# Patient Record
Sex: Female | Born: 1945 | Race: White | Hispanic: No | Smoking: Never smoker
Health system: Southern US, Community
[De-identification: ages and names within clinical notes are randomized; demographics above are authoritative.]

## PROBLEM LIST (undated history)

## (undated) DIAGNOSIS — R7303 Prediabetes: Secondary | ICD-10-CM

## (undated) DIAGNOSIS — M1990 Unspecified osteoarthritis, unspecified site: Secondary | ICD-10-CM

## (undated) DIAGNOSIS — F419 Anxiety disorder, unspecified: Secondary | ICD-10-CM

## (undated) DIAGNOSIS — Z9889 Other specified postprocedural states: Secondary | ICD-10-CM

## (undated) DIAGNOSIS — M797 Fibromyalgia: Secondary | ICD-10-CM

## (undated) DIAGNOSIS — R002 Palpitations: Secondary | ICD-10-CM

## (undated) DIAGNOSIS — J189 Pneumonia, unspecified organism: Secondary | ICD-10-CM

## (undated) DIAGNOSIS — E785 Hyperlipidemia, unspecified: Secondary | ICD-10-CM

## (undated) DIAGNOSIS — T8859XA Other complications of anesthesia, initial encounter: Secondary | ICD-10-CM

## (undated) DIAGNOSIS — S329XXA Fracture of unspecified parts of lumbosacral spine and pelvis, initial encounter for closed fracture: Secondary | ICD-10-CM

## (undated) DIAGNOSIS — K922 Gastrointestinal hemorrhage, unspecified: Secondary | ICD-10-CM

## (undated) DIAGNOSIS — K219 Gastro-esophageal reflux disease without esophagitis: Secondary | ICD-10-CM

## (undated) DIAGNOSIS — R112 Nausea with vomiting, unspecified: Secondary | ICD-10-CM

## (undated) DIAGNOSIS — K259 Gastric ulcer, unspecified as acute or chronic, without hemorrhage or perforation: Secondary | ICD-10-CM

## (undated) HISTORY — DX: Fibromyalgia: M79.7

## (undated) HISTORY — DX: Hyperlipidemia, unspecified: E78.5

## (undated) HISTORY — DX: Anxiety disorder, unspecified: F41.9

## (undated) HISTORY — DX: Pneumonia, unspecified organism: J18.9

## (undated) HISTORY — PX: ORIF HUMERUS FRACTURE: SHX2126

## (undated) HISTORY — PX: ABDOMINAL HYSTERECTOMY: SHX81

## (undated) HISTORY — DX: Unspecified osteoarthritis, unspecified site: M19.90

## (undated) HISTORY — DX: Gastric ulcer, unspecified as acute or chronic, without hemorrhage or perforation: K25.9

## (undated) HISTORY — PX: PYLOROPLASTY: SHX418

## (undated) HISTORY — PX: JOINT REPLACEMENT: SHX530

---
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
## 1988-11-07 DIAGNOSIS — K259 Gastric ulcer, unspecified as acute or chronic, without hemorrhage or perforation: Secondary | ICD-10-CM

## 1988-11-07 HISTORY — DX: Gastric ulcer, unspecified as acute or chronic, without hemorrhage or perforation: K25.9

## 1996-11-07 HISTORY — PX: ORIF TIBIA FRACTURE: SHX5416

## 1996-11-07 HISTORY — PX: FRACTURE SURGERY: SHX138

## 2003-11-08 DIAGNOSIS — J189 Pneumonia, unspecified organism: Secondary | ICD-10-CM

## 2003-11-08 HISTORY — DX: Pneumonia, unspecified organism: J18.9

## 2009-11-07 HISTORY — PX: TOTAL HIP ARTHROPLASTY: SHX124

## 2012-11-07 HISTORY — PX: TOTAL SHOULDER REPLACEMENT: SUR1217

## 2013-11-07 HISTORY — PX: LUMBAR FUSION: SHX111

## 2016-12-12 DIAGNOSIS — Z981 Arthrodesis status: Secondary | ICD-10-CM | POA: Insufficient documentation

## 2016-12-12 DIAGNOSIS — M0579 Rheumatoid arthritis with rheumatoid factor of multiple sites without organ or systems involvement: Secondary | ICD-10-CM | POA: Insufficient documentation

## 2016-12-12 DIAGNOSIS — M797 Fibromyalgia: Secondary | ICD-10-CM | POA: Insufficient documentation

## 2017-01-06 DIAGNOSIS — M1990 Unspecified osteoarthritis, unspecified site: Secondary | ICD-10-CM | POA: Insufficient documentation

## 2017-01-06 DIAGNOSIS — F411 Generalized anxiety disorder: Secondary | ICD-10-CM | POA: Insufficient documentation

## 2017-04-27 DIAGNOSIS — M545 Low back pain, unspecified: Secondary | ICD-10-CM | POA: Insufficient documentation

## 2017-04-27 DIAGNOSIS — G8929 Other chronic pain: Secondary | ICD-10-CM | POA: Insufficient documentation

## 2017-10-25 DIAGNOSIS — F409 Phobic anxiety disorder, unspecified: Secondary | ICD-10-CM | POA: Insufficient documentation

## 2017-10-25 DIAGNOSIS — Z79899 Other long term (current) drug therapy: Secondary | ICD-10-CM | POA: Insufficient documentation

## 2018-04-27 ENCOUNTER — Encounter: Payer: Self-pay | Admitting: Physician Assistant

## 2018-05-09 ENCOUNTER — Ambulatory Visit (INDEPENDENT_AMBULATORY_CARE_PROVIDER_SITE_OTHER): Payer: Medicare Other | Admitting: Physician Assistant

## 2018-05-09 ENCOUNTER — Encounter: Payer: Self-pay | Admitting: Physician Assistant

## 2018-05-09 VITALS — BP 116/72 | HR 84 | Ht 63.0 in | Wt 144.0 lb

## 2018-05-09 DIAGNOSIS — R197 Diarrhea, unspecified: Secondary | ICD-10-CM

## 2018-05-09 DIAGNOSIS — K2270 Barrett's esophagus without dysplasia: Secondary | ICD-10-CM | POA: Diagnosis not present

## 2018-05-09 MED ORDER — DIPHENOXYLATE-ATROPINE 2.5-0.025 MG PO TABS: 2.0000 | ORAL_TABLET | Freq: Four times a day (QID) | ORAL | 1 refills | Status: DC | PRN

## 2018-05-09 MED ORDER — NA SULFATE-K SULFATE-MG SULF 17.5-3.13-1.6 GM/177ML PO SOLN: 1.0000 | ORAL | 0 refills | Status: DC

## 2018-05-09 NOTE — Progress Notes (Signed)
Chief Complaint: Diarrhea  HPI:    Tanya Duncan is a 72 year old Caucasian female with a past medical history as listed below, who was referred to me by Garnetta Buddy, MD for a complaint of diarrhea.      Moved from [place] one year ago.  Extensive past GI history.  Available in media.  Briefly summarized.  2011 had diagnosis of reflux, nausea with vomiting, diarrhea and weakness as well as a family history of colon cancer.  It was recommended that she have an EGD.      EGD at that time 09/10/2010 with biopsies showing small bowel mucosa with preserved villous architecture, antral type mucosa with mild to moderate diffuse chronic gastritis negative for H. pylori and gastric cardia mucosa with focal prominent goblet cell and metaplasia and a background of mild diffuse chronic gastritis consistent with Barrett's esophagus.  No dysplasia.  At follow-up patient had no further symptoms.  She had taken a course of Flagyl and tried Cipro but could not tolerate the stroke.  Labs including vitamin A, celiac studies, Lyme panel and CBC were all normal.  As well as magnesium, TSH and T4.  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  Started on PPI.    06/05/2010 colonoscopy with conscious sedation.  No inflammatory or infiltrative disease was noted.    10/07/2014 office visit described having diarrhea for the past 2 months.  Had stool studies which are negative.  Empiric course of Flagyl did not help.  At that time is recommended she have a repeat EGD for surveillance and identification of possible celiac disease. Patient had a HIDA scan 12/15/2014.  I do not see results from this.    03/20/2014 EGD with biopsy showing mild focal villous blunting in the small bowel and again mild to moderate diffuse chronic gastritis in the gastric cardia but no evidence of atypia or metaplasia in the GE junction.    12/17/2014 colonoscopy with no obvious inflammatory infiltrative disease.  Random biopsies were taken from the  cecum and rectum which were unremarkable.  Repeat recommended in 5 years given family history.   6 /16/19 stool studies including GI pathogen panel, C. difficile and O&P were negative.  CT abdomen and pelvis with contrast 04/22/2018 with no acute abnormalities.    Today, explains that she has had a minimal amount of loose stool over the past 2 years but has used Imodium occasionally which typically worked in the past.  At of the end of May/beginning of June she started with at least 5 watery urgent stools per day.  These were typically after eating.  Patient has had a large amount of testing and has tried Imodium as well as Lomotil 2 tabs 4 times a day (though only for a couple days), which did not slow her stools at all.  Denies any accompanying abdominal pain or other GI symptoms.  Initially did have about a 5-1/2 pound weight loss which is "seemed to level off".  Denies history of similar symptoms.    Denies fever, chills, blood in her stool, melena, anorexia, nausea, vomiting, heartburn, reflux or abdominal pain.  Past Medical History:  Diagnosis Date  . Anxiety   . Arthritis   . Fibromyalgia   . Pneumonia   . Stomach ulcer 1990    Past Surgical History:  Procedure Laterality Date  . ABDOMINAL HYSTERECTOMY    . LUMBAR FUSION  2015  . PYLOROPLASTY    . TOTAL HIP ARTHROPLASTY  2011  .  TOTAL SHOULDER REPLACEMENT  2014    Current Outpatient Medications  Medication Sig Dispense Refill  . escitalopram (LEXAPRO) 20 MG tablet Take 20 mg by mouth daily.    Marland Kitchen gabapentin (NEURONTIN) 600 MG tablet Take 600 mg by mouth 3 (three) times daily.    Marland Kitchen LORazepam (ATIVAN) 0.5 MG tablet Take 1 mg by mouth daily.    . nortriptyline (PAMELOR) 50 MG capsule Take 50 mg by mouth. 1-2 tablets by mouth as needed    . ranitidine (ACID REDUCER) 150 MG tablet Take 150 mg by mouth as needed for heartburn.    . zolpidem (AMBIEN) 5 MG tablet Take 5 mg by mouth at bedtime as needed for sleep.     No current  facility-administered medications for this visit.     Allergies as of 05/09/2018 - Review Complete 05/09/2018  Allergen Reaction Noted  . Avelox [moxifloxacin hcl in nacl]  05/09/2018  . Indocin [indomethacin]  05/09/2018  . Sulfa antibiotics  05/09/2018    Family History  Problem Relation Age of Onset  . Colon cancer Father 35    Social History   Socioeconomic History  . Marital status: Divorced    Spouse name: Not on file  . Number of children: Not on file  . Years of education: Not on file  . Highest education level: Not on file  Occupational History  . Occupation: Retired  Engineer, production  . Financial resource strain: Not on file  . Food insecurity:    Worry: Not on file    Inability: Not on file  . Transportation needs:    Medical: Not on file    Non-medical: Not on file  Tobacco Use  . Smoking status: Never Smoker  . Smokeless tobacco: Never Used  Substance and Sexual Activity  . Alcohol use: Yes    Comment: occasionally  . Drug use: Never  . Sexual activity: Not on file  Lifestyle  . Physical activity:    Days per week: Not on file    Minutes per session: Not on file  . Stress: Not on file  Relationships  . Social connections:    Talks on phone: Not on file    Gets together: Not on file    Attends religious service: Not on file    Active member of club or organization: Not on file    Attends meetings of clubs or organizations: Not on file    Relationship status: Not on file  . Intimate partner violence:    Fear of current or ex partner: Not on file    Emotionally abused: Not on file    Physically abused: Not on file    Forced sexual activity: Not on file  Other Topics Concern  . Not on file  Social History Narrative  . Not on file    Review of Systems:    Constitutional: No fever, chills, weakness or fatigue Skin: No rash  Cardiovascular: No chest pain Respiratory: No SOB  Gastrointestinal: See HPI and otherwise negative Genitourinary: No  dysuria  Neurological: No headache, dizziness or syncope Musculoskeletal: No new muscle or joint pain Hematologic: No bleeding  Psychiatric: No history of depression or anxiety   Physical Exam:  Vital signs: BP 116/72   Pulse 84   Ht 5\' 3"  (1.6 m)   Wt 144 lb (65.3 kg)   BMI 25.51 kg/m   Constitutional:   Pleasant Caucasian female appears to be in NAD, Well developed, Well nourished, alert and cooperative Head:  Normocephalic and atraumatic. Eyes:   PEERL, EOMI. No icterus. Conjunctiva pink. Ears:  Normal auditory acuity. Neck:  Supple Throat: Oral cavity and pharynx without inflammation, swelling or lesion.  Respiratory: Respirations even and unlabored. Lungs clear to auscultation bilaterally.   No wheezes, crackles, or rhonchi.  Cardiovascular: Normal S1, S2. No MRG. Regular rate and rhythm. No peripheral edema, cyanosis or pallor.  Gastrointestinal:  Soft, nondistended, nontender. No rebound or guarding. Normal bowel sounds. No appreciable masses or hepatomegaly. Rectal:  Not performed.  Msk:  Symmetrical without gross deformities. Without edema, no deformity or joint abnormality.  Neurologic:  Alert and  oriented x4;  grossly normal neurologically.  Skin:   Dry and intact without significant lesions or rashes. Psychiatric:Demonstrates good judgement and reason without abnormal affect or behaviors.  See HPI  Assessment: 1.  Diarrhea: Has had troubles with diarrhea in the past, some villous blunting on an EGD after review of records, acute increase in loose stools and symptoms 2 months ago, all GI stool studies have been run and are negative, CT also negative/normal; consider microscopic colitis versus possibly celiac disease? 2.  History of Barrett's esophagus: EGD last in 2015, biopsies at that time with no history of Barrett's esophagus, 2 previous EGDs with Barrett's 3.  Family history of colon cancer: Last colonoscopy in 2016 was normal, repeat recommended in 5 years given  family history  Plan: 1.  Scheduled patient for colonoscopy in [facility].  Patient does request to follow with Dr. Lavon Paganini, but would like to be seen sooner prior to her first available appointment in late August.  Scheduled patient for colonoscopy with Dr. Russella Dar.  Did discuss risk and benefits, limitations and alternatives.  Patient will follow with Dr. Lavon Paganini once done with procedure. 2.  Reviewed all of patient's extensive records from [place]. It appears she may need EGD for Barrett's surveillance in the near future? 3.  Reviewed recent stool studies and imaging with the patient. 4.  Prescribed Lomotil 2 tabs 4 times daily.  #30 with one refill. If this is not helping patient can discontinue. 5.  Patient to follow in clinic with me or Dr. Lavon Paganini as recommended after time of colonoscopy.  Hyacinth Meeker, PA-C [facility] 05/09/2018, 11:19 AM  Cc: Garnetta Buddy, MD

## 2018-05-09 NOTE — Patient Instructions (Addendum)
You have been scheduled for a colonoscopy. Please follow written instructions given to you at your visit today.  Please pick up your prep supplies at the pharmacy within the next 1-3 days. If you use inhalers (even only as needed), please bring them with you on the day of your procedure. Your physician has requested that you go to www.startemmi.com and enter the access code given to you at your visit today. This web site gives a general overview about your procedure. However, you should still follow specific instructions given to you by our office regarding your preparation for the procedure.  We have sent the following medications to your pharmacy for you to pick up at your convenience: Lomotil 2 tablets four times a day

## 2018-05-13 NOTE — Progress Notes (Signed)
Reviewed and agree with initial management plan. Patient requests to see Dr. Lavon Paganini for ongoing care.   Venita Lick. Russella Dar, MD [facility]

## 2018-05-15 ENCOUNTER — Telehealth: Payer: Self-pay | Admitting: Physician Assistant

## 2018-05-15 NOTE — Telephone Encounter (Signed)
Tanya Duncan FYI 

## 2018-05-16 NOTE — Telephone Encounter (Signed)
Called back to say she will not have the Colonocopy done until she speak to nurse or Tanya Duncan. Patient states " the root of her problem will not be found by checking her colon."

## 2018-05-16 NOTE — Telephone Encounter (Signed)
The pt wanted to advise that she has had less diarrhea now that she is on gabapentin.  I did advise her that gabapentin can cause constipation and that may be the reason.  She also questioned why a colonoscopy is being done and not an EGD.  I advised her that her symptoms warrant a colon.  She is due for EGD in the near future per last office note.  The pt questions were all answered and she will call back with any further questions.

## 2018-05-17 ENCOUNTER — Encounter: Payer: Medicare Other | Admitting: Gastroenterology

## 2018-05-23 ENCOUNTER — Encounter: Payer: Self-pay | Admitting: Gastroenterology

## 2018-05-23 ENCOUNTER — Ambulatory Visit (AMBULATORY_SURGERY_CENTER): Payer: Medicare Other | Admitting: Gastroenterology

## 2018-05-23 VITALS — BP 110/68 | HR 75 | Temp 97.8°F | Resp 16 | Ht 63.0 in | Wt 144.0 lb

## 2018-05-23 DIAGNOSIS — K52831 Collagenous colitis: Secondary | ICD-10-CM | POA: Diagnosis not present

## 2018-05-23 DIAGNOSIS — R197 Diarrhea, unspecified: Secondary | ICD-10-CM | POA: Diagnosis not present

## 2018-05-23 DIAGNOSIS — Z800 Family history of malignant neoplasm of digestive organs: Secondary | ICD-10-CM | POA: Diagnosis not present

## 2018-05-23 MED ORDER — SODIUM CHLORIDE 0.9 % IV SOLN: 500.0000 mL | Freq: Once | INTRAVENOUS | Status: DC

## 2018-05-23 NOTE — Progress Notes (Signed)
Called to room to assist during endoscopic procedure.  Patient ID and intended procedure confirmed with present staff. Received instructions for my participation in the procedure from the performing physician.  

## 2018-05-23 NOTE — Progress Notes (Signed)
I have reviewed the patient's medical history in detail and updated the computerized patient record.

## 2018-05-23 NOTE — Progress Notes (Signed)
A and O x3. Report to RN. Tolerated MAC anesthesia well.

## 2018-05-23 NOTE — Patient Instructions (Signed)
Handouts given on hemorrhoids and diverticulosis**   YOU HAD AN ENDOSCOPIC PROCEDURE TODAY AT THE [facility]:   Refer to the procedure report that was given to you for any specific questions about what was found during the examination.  If the procedure report does not answer your questions, please call your gastroenterologist to clarify.  If you requested that your care partner not be given the details of your procedure findings, then the procedure report has been included in a sealed envelope for you to review at your convenience later.  YOU SHOULD EXPECT: Some feelings of bloating in the abdomen. Passage of more gas than usual.  Walking can help get rid of the air that was put into your GI tract during the procedure and reduce the bloating. If you had a lower endoscopy (such as a colonoscopy or flexible sigmoidoscopy) you may notice spotting of blood in your stool or on the toilet paper. If you underwent a bowel prep for your procedure, you may not have a normal bowel movement for a few days.  Please Note:  You might notice some irritation and congestion in your nose or some drainage.  This is from the oxygen used during your procedure.  There is no need for concern and it should clear up in a day or so.  SYMPTOMS TO REPORT IMMEDIATELY:   Following lower endoscopy (colonoscopy or flexible sigmoidoscopy):  Excessive amounts of blood in the stool  Significant tenderness or worsening of abdominal pains  Swelling of the abdomen that is new, acute  Fever of 100F or higher  For urgent or emergent issues, a gastroenterologist can be reached at any hour by calling [phone].   DIET:  We do recommend a small meal at first, but then you may proceed to your regular diet.  Drink plenty of fluids but you should avoid alcoholic beverages for 24 hours.  ACTIVITY:  You should plan to take it easy for the rest of today and you should NOT DRIVE or use heavy machinery until tomorrow  (because of the sedation medicines used during the test).    FOLLOW UP: Our staff will call the number listed on your records the next business day following your procedure to check on you and address any questions or concerns that you may have regarding the information given to you following your procedure. If we do not reach you, we will leave a message.  However, if you are feeling well and you are not experiencing any problems, there is no need to return our call.  We will assume that you have returned to your regular daily activities without incident.  If any biopsies were taken you will be contacted by phone or by letter within the next 1-3 weeks.  Please call us at [phone] if you have not heard about the biopsies in 3 weeks.    SIGNATURES/CONFIDENTIALITY: You and/or your care partner have signed paperwork which will be entered into your electronic medical record.  These signatures attest to the fact that that the information above on your After Visit Summary has been reviewed and is understood.  Full responsibility of the confidentiality of this discharge information lies with you and/or your care-partner. 

## 2018-05-23 NOTE — Op Note (Signed)
[facility] Patient Name: Tanya Duncan Procedure Date: 05/23/2018 9:02 AM MRN: [id number] Endoscopist: Meryl Dare , MD Age: 73 Referring MD:  Date of Birth: August 22, 1946 Gender: Female Account #: [id number] Procedure:                Colonoscopy Indications:              Clinically significant diarrhea of unexplained                            origin. Family history of colon cancer. Medicines:                Monitored Anesthesia Care Procedure:                Pre-Anesthesia Assessment:                           - Prior to the procedure, a History and Physical                            was performed, and patient medications and                            allergies were reviewed. The patient's tolerance of                            previous anesthesia was also reviewed. The risks                            and benefits of the procedure and the sedation                            options and risks were discussed with the patient.                            All questions were answered, and informed consent                            was obtained. Prior Anticoagulants: The patient has                            taken no previous anticoagulant or antiplatelet                            agents. ASA Grade Assessment: II - A patient with                            mild systemic disease. After reviewing the risks                            and benefits, the patient was deemed in                            satisfactory condition to undergo the procedure.  After obtaining informed consent, the colonoscope                            was passed under direct vision. Throughout the                            procedure, the patient's blood pressure, pulse, and                            oxygen saturations were monitored continuously. The                            Colonoscope was introduced through the anus and                            advanced to the the  terminal ileum, with                            identification of the appendiceal orifice and IC                            valve. The ileocecal valve, appendiceal orifice,                            and rectum were photographed. The quality of the                            bowel preparation was good. The patient tolerated                            the procedure well. The colonoscopy was somewhat                            difficult due to a tortuous colon. Scope In: 9:11:15 AM Scope Out: 9:32:30 AM Scope Withdrawal Time: 0 hours 11 minutes 43 seconds  Total Procedure Duration: 0 hours 21 minutes 15 seconds  Findings:                 The perianal and digital rectal examinations were                            normal.                           The terminal ileum appeared normal.                           Many medium-mouthed diverticula were found in the                            left colon. There was no evidence of diverticular                            bleeding.  Internal hemorrhoids were found during                            retroflexion. The hemorrhoids were small and Grade                            I (internal hemorrhoids that do not prolapse).                           The exam was otherwise without abnormality on                            direct and retroflexion views. Random biopsies                            obtained. Complications:            No immediate complications. Estimated blood loss:                            None. Estimated Blood Loss:     Estimated blood loss: none. Impression:               - The examined portion of the ileum was normal.                           - Moderate diverticulosis in the left colon.                           - Small internal hemorrhoids.                           - The examination was otherwise normal on direct                            and retroflexion views. Random biopsies obtained. Recommendation:            - Repeat colonoscopy in 5 years with Dr. Lavon Paganini.                           - Patient has a contact number available for                            emergencies. The signs and symptoms of potential                            delayed complications were discussed with the                            patient. Return to normal activities tomorrow.                            Written discharge instructions were provided to the                            patient.                           -  Resume previous diet.                           - Continue present medications.                           - Await pathology results.                           - Return to GI office with Dr. Lavon Paganini at the next                            available appointment. Meryl Dare, MD 05/23/2018 9:40:32 AM This report has been signed electronically.

## 2018-05-24 ENCOUNTER — Telehealth: Payer: Self-pay

## 2018-05-24 NOTE — Telephone Encounter (Signed)
  Follow up Call-  Call back number 05/23/2018  Post procedure Call Back phone  # [phone]  Permission to leave phone message Yes  Some recent data might be hidden     Patient questions:  Do you have a fever, pain , or abdominal swelling? No. Pain Score  0 *  Have you tolerated food without any problems? yes  Have you been able to return to your normal activities? Yes.    Do you have any questions about your discharge instructions: Diet   No. Medications  No. Follow up visit  No.  Do you have questions or concerns about your Care? No.  Actions: * If pain score is 4 or above: No action needed, pain <4.

## 2018-05-24 NOTE — Progress Notes (Signed)
Last EGD was 4 years and has history of Barrett's esophagus. We can proceed with EGD along with colonoscopy for surveillance as curent guidelines recommend surveillance in 3-5 years Reviewed and agree with documentation and assessment and plan. Iona Beard , MD

## 2018-05-28 ENCOUNTER — Telehealth: Payer: Self-pay | Admitting: Gastroenterology

## 2018-05-28 NOTE — Telephone Encounter (Signed)
Dr. Lavon Paganini patient Tanya Duncan. Dr. Russella Dar did Colonoscopy and patient is to go back to Dr. Lavon Paganini.

## 2018-05-28 NOTE — Telephone Encounter (Signed)
I am not sure who reviews the biopsy for this patient. She saw Victorino Dike. The colonoscopy was done by Dr Russella Dar. The patient wants to see Dr Lavon Paganini. Who do I send this to?

## 2018-05-29 ENCOUNTER — Telehealth: Payer: Self-pay | Admitting: Gastroenterology

## 2018-05-29 MED ORDER — OMEPRAZOLE 40 MG PO CPDR: 40.0000 mg | DELAYED_RELEASE_CAPSULE | Freq: Every day | ORAL | 1 refills | Status: DC

## 2018-05-29 MED ORDER — BUDESONIDE 3 MG PO CPEP: 9.0000 mg | ORAL_CAPSULE | Freq: Every day | ORAL | 1 refills | Status: DC

## 2018-05-29 NOTE — Telephone Encounter (Signed)
The patient has been notified of this information and all questions answered. The pt has been advised of the information and verbalized understanding.   The prescriptions have been sent to the pharmacy as requested. The pt will call if the entocort is too expensive.  Appt made with Dr Lavon Paganini on 06/07/18 at 1030 am.

## 2018-05-29 NOTE — Telephone Encounter (Signed)
Dr Russella Dar did the colon so I assume he would need to review the biopsy.  I will send to him for review of the biopsy but I believe Dr Lavon Paganini will be her MD.  Victorino Dike can you review for reflux symptoms? The pt is taking omeprazole OTC.  I advised her to take 40 mg prior to breakfast.   Can we send a prescription? Also, she is questioning why she has not been tested for celiac and wants to know if she can have labs.  Please advise.

## 2018-05-29 NOTE — Telephone Encounter (Signed)
Patient wanted to know the name of the biopsy result.  Reviewed the results and questions answered

## 2018-05-29 NOTE — Telephone Encounter (Signed)
Pathology showed collangenous colitis. This is the likely cause of her diarrhea. Entocort  9 mg po qd, 1 month supply and 1 refill. Omeprazole 40 mg po qam, 1 month supply and 1 refill.  Pt requested all further follow up after colonoscopy with Dr. Lavon Paganini.  Further mgmt plans by appt with Dr. Lavon Paganini or J[facility] in 2-3 weeks to assess symptoms, consider celiac testing, etc.  No need for path letter.

## 2018-06-07 ENCOUNTER — Other Ambulatory Visit (INDEPENDENT_AMBULATORY_CARE_PROVIDER_SITE_OTHER): Payer: Medicare Other

## 2018-06-07 ENCOUNTER — Ambulatory Visit (INDEPENDENT_AMBULATORY_CARE_PROVIDER_SITE_OTHER): Payer: Medicare Other | Admitting: Gastroenterology

## 2018-06-07 ENCOUNTER — Encounter: Payer: Self-pay | Admitting: Gastroenterology

## 2018-06-07 VITALS — BP 108/70 | HR 88 | Ht 63.0 in | Wt 140.0 lb

## 2018-06-07 DIAGNOSIS — K52831 Collagenous colitis: Secondary | ICD-10-CM

## 2018-06-07 LAB — IGA: IgA: 128 mg/dL (ref 68–378)

## 2018-06-07 NOTE — Progress Notes (Signed)
Tanya Duncan    [id number]    19-Sep-1946  Primary Care Physician:Williamson, Marena Chancy, MD  Referring Physician: Angelica Ran, MD [place],  [id number]  Chief complaint: Diarrhea  HPI: 72 year old female recently moved to [facility] from [place], was seen by Earnie Larsson on May 09, 2018 with complaints of diarrhea underwent colonoscopy by Dr. Fuller Plan with biopsies positive for collagenous colitis.  She was started on Entocort.  She initially took it for a week, had severe nausea thought had an interaction with gabapentin.  Since then she has weaned herself off gabapentin and has restarted Entocort and has been taking it for past 4 days.  She has noticed significant improvement of diarrhea and is no longer taking Lomotil.  She is starting to have more formed stool.    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  Celiac antibody panel was negative She is not on a strict gluten-free diet.  Denies excessive use of NSAIDs Denies any nausea, vomiting, abdominal pain, melena or bright red blood per rectum    Outpatient Encounter Medications as of 06/07/2018  Medication Sig  . budesonide (ENTOCORT EC) 3 MG 24 hr capsule Take 3 capsules (9 mg total) by mouth daily.  . diphenoxylate-atropine (LOMOTIL) 2.5-0.025 MG tablet Take 2 tablets by mouth 4 (four) times daily as needed for diarrhea or loose stools.  Marland Kitchen escitalopram (LEXAPRO) 20 MG tablet Take 20 mg by mouth daily.  Marland Kitchen gabapentin (NEURONTIN) 600 MG tablet Take 300 mg by mouth 3 (three) times daily.   Marland Kitchen LORazepam (ATIVAN) 0.5 MG tablet Take 1 mg by mouth daily.  . nortriptyline (PAMELOR) 50 MG capsule Take 50 mg by mouth. 1-2 tablets by mouth as needed  . omeprazole (PRILOSEC) 40 MG capsule Take  1 capsule (40 mg total) by mouth daily.  Marland Kitchen zolpidem (AMBIEN) 5 MG tablet Take 5 mg by mouth at bedtime as needed for sleep.  . [DISCONTINUED] Na Sulfate-K Sulfate-Mg Sulf (SUPREP BOWEL PREP KIT) 17.5-3.13-1.6 GM/177ML SOLN Take 1 kit by mouth as directed.  . [DISCONTINUED] 0.9 %  sodium chloride infusion    No facility-administered encounter medications on file as of 06/07/2018.     Allergies as of 06/07/2018 - Review Complete 05/23/2018  Allergen Reaction Noted  . Avelox [moxifloxacin hcl in nacl]  05/09/2018  . Indocin [indomethacin]  05/09/2018  . Sulfa antibiotics  05/09/2018    Past Medical History:  Diagnosis Date  . Anxiety   . Arthritis   . Fibromyalgia   . Pneumonia   . Stomach ulcer 1990    Past Surgical History:  Procedure Laterality Date  . ABDOMINAL HYSTERECTOMY    . LUMBAR FUSION  2015  . PYLOROPLASTY    . TOTAL HIP ARTHROPLASTY  2011  . TOTAL SHOULDER REPLACEMENT  2014    Family History  Problem Relation Age of Onset  . Colon cancer Father 37    Social History   Socioeconomic History  . Marital status: Divorced    Spouse name: Not on file  . Number of children: Not on file  . Years of education: Not on file  . Highest education level: Not on file  Occupational History  . Occupation: Retired  Scientific laboratory technician  . Emergency planning/management officer  strain: Not on file  . Food insecurity:    Worry: Not on file    Inability: Not on file  . Transportation needs:    Medical: Not on file    Non-medical: Not on file  Tobacco Use  . Smoking status: Never Smoker  . Smokeless tobacco: Never Used  Substance and Sexual Activity  . Alcohol use: Yes    Comment: occasionally  . Drug use: Never  . Sexual activity: Not on file  Lifestyle  . Physical activity:    Days per week: Not on file    Minutes per session: Not on file  . Stress: Not on file  Relationships  . Social connections:    Talks on phone: Not on file    Gets together: Not on file    Attends religious  service: Not on file    Active member of club or organization: Not on file    Attends meetings of clubs or organizations: Not on file    Relationship status: Not on file  . Intimate partner violence:    Fear of current or ex partner: Not on file    Emotionally abused: Not on file    Physically abused: Not on file    Forced sexual activity: Not on file  Other Topics Concern  . Not on file  Social History Narrative  . Not on file      Review of systems: Review of Systems  Constitutional: Negative for fever and chills.  Positive lack of energy HENT: Negative.   Eyes: Negative for blurred vision.  Respiratory: Negative for cough, shortness of breath and wheezing.   Cardiovascular: Negative for chest pain and palpitations.  Gastrointestinal: as per HPI Genitourinary: Negative for dysuria, urgency, frequency and hematuria.  Musculoskeletal: Positive for myalgias, back pain and joint pain.  Skin: Negative for itching and rash.  Neurological: Negative for dizziness, tremors, focal weakness, seizures and loss of consciousness.  Endo/Heme/Allergies: Positive for seasonal allergies.  Psychiatric/Behavioral: Negative for depression, suicidal ideas and hallucinations. Positive for anxiety All other systems reviewed and are negative.   Physical Exam: Vitals:   06/07/18 1047  BP: 108/70  Pulse: 88   Body mass index is 24.8 kg/m. Gen:      No acute distress HEENT:  EOMI, sclera anicteric Neck:     No masses; no thyromegaly Lungs:    Clear to auscultation bilaterally; normal respiratory effort CV:         Regular rate and rhythm; no murmurs Abd:      + bowel sounds; soft, non-tender; no palpable masses, no distension Ext:    No edema; adequate peripheral perfusion Skin:      Warm and dry; no rash Neuro: alert and oriented x 3 Psych: normal mood and affect  Data Reviewed:  Reviewed labs, radiology imaging, old records and pertinent past GI work up   Assessment and  Plan/Recommendations:  72 year old female with history of HLA DQ 2/DQ 8+ , diarrhea, random colon biopsies positive for collagenous colitis Significant improvement of symptoms with Entocort, continue budesonide 9 mg daily for 2 months Avoid NSAIDs Advised patient to discuss with PMD to decrease the dose of Lexapro to 10 mg daily and consider tapering it off as SSRI can potentially cause microscopic colitis Check TTG IgA antibody.  Given patient has positive allele for celiac, will need to consider strict gluten-free diet if she develops diarrhea even if celiiac Ab negative History of Barrett's esophagus, due for surveillance EGD.  We will plan  to schedule for repeat EGD with biopsies next visit Family history of colon cancer: Due for colonoscopy July 2024 Return in 2 months or sooner if needed  25 minutes was spent face-to-face with the patient. Greater than 50% of the time used for counseling as well as treatment plan and follow-up. She had multiple questions which were answered to her satisfaction  K. Denzil Magnuson , MD [phone]    CC: Angelica Ran, MD

## 2018-06-07 NOTE — Patient Instructions (Addendum)
Continue Entocort for 2 months  Decrease Lexapro to 10 mg daily, please discuss this with your Primary Care Provider   Go to the basement today for labs    Please call back and schedule a 2 month follow up  If you are age 70 or older, your body mass index should be between 23-30. Your Body mass index is 24.8 kg/m. If this is out of the aforementioned range listed, please consider follow up with your Primary Care Provider.  If you are age 22 or younger, your body mass index should be between 19-25. Your Body mass index is 24.8 kg/m. If this is out of the aformentioned range listed, please consider follow up with your Primary Care Provider.    Thank you for choosing [facility]  Philbert Riser Nandigam,MD

## 2018-06-08 LAB — TISSUE TRANSGLUTAMINASE, IGA: (TTG) AB, IGA: 1 U/mL

## 2018-07-19 ENCOUNTER — Telehealth: Payer: Self-pay

## 2018-07-19 NOTE — Telephone Encounter (Signed)
Opened in error

## 2018-07-19 NOTE — Telephone Encounter (Signed)
completed

## 2018-07-21 ENCOUNTER — Other Ambulatory Visit: Payer: Self-pay | Admitting: Gastroenterology

## 2018-08-20 ENCOUNTER — Ambulatory Visit: Payer: Medicare Other | Admitting: Gastroenterology

## 2018-09-07 ENCOUNTER — Telehealth: Payer: Self-pay | Admitting: Gastroenterology

## 2018-09-07 NOTE — Telephone Encounter (Signed)
Patient states that budesonide is too expensive would like something cheaper

## 2018-09-10 NOTE — Telephone Encounter (Signed)
Left message to call back. Last OV note indicates she was to follow up in office. Also the budesonide was to be stopped after 2 months.

## 2018-09-12 ENCOUNTER — Other Ambulatory Visit: Payer: Self-pay | Admitting: Gastroenterology

## 2018-11-11 ENCOUNTER — Other Ambulatory Visit: Payer: Self-pay | Admitting: Gastroenterology

## 2018-11-15 ENCOUNTER — Other Ambulatory Visit: Payer: Self-pay | Admitting: Gastroenterology

## 2019-01-10 DIAGNOSIS — E559 Vitamin D deficiency, unspecified: Secondary | ICD-10-CM | POA: Insufficient documentation

## 2019-01-30 ENCOUNTER — Other Ambulatory Visit: Payer: Self-pay | Admitting: Family Medicine

## 2019-01-31 ENCOUNTER — Other Ambulatory Visit: Payer: Self-pay | Admitting: Family Medicine

## 2019-01-31 DIAGNOSIS — Z1231 Encounter for screening mammogram for malignant neoplasm of breast: Secondary | ICD-10-CM

## 2019-04-09 ENCOUNTER — Other Ambulatory Visit: Payer: Self-pay | Admitting: Orthopaedic Surgery

## 2019-05-06 NOTE — Patient Instructions (Addendum)
YOU NEED TO HAVE A COVID 19 TEST ON_Friday 7/3______ @_______ , THIS TEST MUST BE DONE BEFORE SURGERY, COME TO [facility]TRANCE. ONCE YOUR COVID TEST IS COMPLETED, PLEASE BEGIN THE QUARANTINE INSTRUCTIONS AS OUTLINED IN YOUR HANDOUT.                Tanya Duncan    Your procedure is scheduled on: 05-13-2018   Report to [facility] Main  Entrance Report to admitting at 7:20  AM      Call this number if you have problems the morning of surgery [phone]     Remember:  BRUSH YOUR TEETH MORNING OF SURGERY AND RINSE YOUR MOUTH OUT, NO CHEWING GUM CANDY OR MINTS.   NO SOLID FOOD AFTER MIDNIGHT THE NIGHT PRIOR TO SURGERY . NOTHING BY MOUTH EXCEPT CLEAR LIQUIDS UNTIL 6:50 AM . PLEASE FINISH ENSURE DRINK PER SURGEON ORDER 3 HOURS PRIOR TO SCHEDULED SURGERY TIME WHICH NEEDS TO BE COMPLETED AT 6:50 AM.     CLEAR LIQUID DIET   Foods Allowed                                                                     Foods Excluded  Coffee and tea, regular and decaf                             liquids that you cannot  Plain Jell-O in any flavor                                             see through such as: Fruit ices (not with fruit pulp)                                     milk, soups, orange juice  Iced Popsicles                                    All solid food Carbonated beverages, regular and diet                                    Cranberry, grape and apple juices Sports drinks like Gatorade Lightly seasoned clear broth or consume(fat free) Sugar, honey syrup  Sample Menu Breakfast                                Lunch                                     Supper Cranberry juice                    Beef broth  Chicken broth Jell-O                                     Grape juice                           Apple juice Coffee or tea                        Jell-O                                      Popsicle                                                 Coffee or tea                        Coffee or tea  _____________________________________________________________________    Take these medicines the morning of surgery with A SIP OF WATER: Gabapentin, Lexapro, Ultram if needed                                You may not have any metal on your body including hair pins and piercings           Do not wear jewelry, make-up, lotions, powders or perfumes, deodorant             Do not wear nail polish.  Do not shave  48 hours prior to surgery.              Do not bring valuables to the hospital. [place].  Contacts, dentures or bridgework may not be worn into surgery. m.     _____________________________________________________________________             [facility] - Preparing for Surgery Before surgery, you can play an important role.   Because skin is not sterile, your skin needs to be as free of germs as possible.   You can reduce the number of germs on your skin by washing with CHG (chlorahexidine gluconate) soap before surgery.   CHG is an antiseptic cleaner which kills germs and bonds with the skin to continue killing germs even after washing. Please DO NOT use if you have an allergy to CHG or antibacterial soaps.   If your skin becomes reddened/irritated stop using the CHG and inform your nurse when you arrive at Short Stay. Do not shave (including legs and underarms) for at least 48 hours prior to the first CHG shower.Please follow these instructions carefully:  1.  Shower with CHG Soap the night before surgery and the  morning of Surgery.  2.  If you choose to wash your hair, wash your hair first as usual with your  normal  shampoo.  3.  After you shampoo, rinse your hair and body thoroughly to remove the  shampoo.  4.  Use CHG as you would any other liquid soap.  You can apply chg directly  to the skin and wash                        Gently with a scrungie or clean washcloth.  5.  Apply the CHG Soap to your body ONLY FROM THE NECK DOWN.   Do not use on face/ open                           Wound or open sores. Avoid contact with eyes, ears mouth and genitals (private parts).                       Wash face,  Genitals (private parts) with your normal soap.             6.  Wash thoroughly, paying special attention to the area where your surgery  will be performed.  7.  Thoroughly rinse your body with warm water from the neck down.  8.  DO NOT shower/wash with your normal soap after using and rinsing off  the CHG Soap.             9.  Pat yourself dry with a clean towel.            10.  Wear clean pajamas.            11.  Place clean sheets on your bed the night of your first shower and do not  sleep with pets.  Day of Surgery : Do not apply any lotions/deodorants the morning of surgery.  Please wear clean clothes to the hospital/surgery center.   FAILURE TO FOLLOW THESE INSTRUCTIONS MAY RESULT IN THE CANCELLATION OF YOUR SURGERY PATIENT SIGNATURE_________________________________  NURSE SIGNATURE__________________________________  ________________________________________________________________________   Tanya Duncan  An incentive spirometer is a tool that can help keep your lungs clear and active. This tool measures how well you are filling your lungs with each breath. Taking long deep breaths may help reverse or decrease the chance of developing breathing (pulmonary) problems (especially infection) following:  A long period of time when you are unable to move or be active. BEFORE THE PROCEDURE   If the spirometer includes an indicator to show your best effort, your nurse or respiratory therapist will set it to a desired goal.  If possible, sit up straight or lean slightly forward. Try not to slouch.  Hold the incentive spirometer in an upright position. INSTRUCTIONS FOR USE  1. Sit on  the edge of your bed if possible, or sit up as far as you can in bed or on a chair. 2. Hold the incentive spirometer in an upright position. 3. Breathe out normally. 4. Place the mouthpiece in your mouth and seal your lips tightly around it. 5. Breathe in slowly and as deeply as possible, raising the piston or the ball toward the top of the column. 6. Hold your breath for 3-5 seconds or for as long as possible. Allow the piston or ball to fall to the bottom of the column. 7. Remove the mouthpiece from your mouth and breathe out normally. 8. Rest for a few seconds and repeat Steps 1 through 7 at least 10 times every 1-2 hours when you are awake. Take your time and take a few normal breaths between deep breaths. 9. The spirometer may include an indicator to show  your best effort. Use the indicator as a goal to work toward during each repetition. 10. After each set of 10 deep breaths, practice coughing to be sure your lungs are clear. If you have an incision (the cut made at the time of surgery), support your incision when coughing by placing a pillow or rolled up towels firmly against it. Once you are able to get out of bed, walk around indoors and cough well. You may stop using the incentive spirometer when instructed by your caregiver.  RISKS AND COMPLICATIONS  Take your time so you do not get dizzy or light-headed.  If you are in pain, you may need to take or ask for pain medication before doing incentive spirometry. It is harder to take a deep breath if you are having pain. AFTER USE  Rest and breathe slowly and easily.  It can be helpful to keep track of a log of your progress. Your caregiver can provide you with a simple table to help with this. If you are using the spirometer at home, follow these instructions: Luke IF:   You are having difficultly using the spirometer.  You have trouble using the spirometer as often as instructed.  Your pain medication is not giving  enough relief while using the spirometer.  You develop fever of 100.5 F (38.1 C) or higher. SEEK IMMEDIATE MEDICAL CARE IF:   You cough up bloody sputum that had not been present before.  You develop fever of 102 F (38.9 C) or greater.  You develop worsening pain at or near the incision site. MAKE SURE YOU:   Understand these instructions.  Will watch your condition.  Will get help right away if you are not doing well or get worse. Document Released: 03/06/2007 Document Revised: 01/16/2012 Document Reviewed: 05/07/2007 Exit[employer] Patient Information 2014 ExitCare, [place].   ________________________________________________________________________  WHAT IS A BLOOD TRANSFUSION? Blood Transfusion Information  A transfusion is the replacement of blood or some of its parts. Blood is made up of multiple cells which provide different functions.  Red blood cells carry oxygen and are used for blood loss replacement.  White blood cells fight against infection.  Platelets control bleeding.  Plasma helps clot blood.  Other blood products are available for specialized needs, such as hemophilia or other clotting disorders. BEFORE THE TRANSFUSION  Who gives blood for transfusions?   Healthy volunteers who are fully evaluated to make sure their blood is safe. This is blood bank blood. Transfusion therapy is the safest it has ever been in the practice of medicine. Before blood is taken from a donor, a complete history is taken to make sure that person has no history of diseases nor engages in risky social behavior (examples are intravenous drug use or sexual activity with multiple partners). The donor's travel history is screened to minimize risk of transmitting infections, such as malaria. The donated blood is tested for signs of infectious diseases, such as HIV and hepatitis. The blood is then tested to be sure it is compatible with you in order to minimize the chance of a transfusion  reaction. If you or a relative donates blood, this is often done in anticipation of surgery and is not appropriate for emergency situations. It takes many days to process the donated blood. RISKS AND COMPLICATIONS Although transfusion therapy is very safe and saves many lives, the main dangers of transfusion include:   Getting an infectious disease.  Developing a transfusion reaction. This is an allergic reaction to  something in the blood you were given. Every precaution is taken to prevent this. The decision to have a blood transfusion has been considered carefully by your caregiver before blood is given. Blood is not given unless the benefits outweigh the risks. AFTER THE TRANSFUSION  Right after receiving a blood transfusion, you will usually feel much better and more energetic. This is especially true if your red blood cells have gotten low (anemic). The transfusion raises the level of the red blood cells which carry oxygen, and this usually causes an energy increase.  The nurse administering the transfusion will monitor you carefully for complications. HOME CARE INSTRUCTIONS  No special instructions are needed after a transfusion. You may find your energy is better. Speak with your caregiver about any limitations on activity for underlying diseases you may have. SEEK MEDICAL CARE IF:   Your condition is not improving after your transfusion.  You develop redness or irritation at the intravenous (IV) site. SEEK IMMEDIATE MEDICAL CARE IF:  Any of the following symptoms occur over the next 12 hours:  Shaking chills.  You have a temperature by mouth above 102 F (38.9 C), not controlled by medicine.  Chest, back, or muscle pain.  People around you feel you are not acting correctly or are confused.  Shortness of breath or difficulty breathing.  Dizziness and fainting.  You get a rash or develop hives.  You have a decrease in urine output.  Your urine turns a dark color or  changes to pink, red, or brown. Any of the following symptoms occur over the next 10 days:  You have a temperature by mouth above 102 F (38.9 C), not controlled by medicine.  Shortness of breath.  Weakness after normal activity.  The white part of the eye turns yellow (jaundice).  You have a decrease in the amount of urine or are urinating less often.  Your urine turns a dark color or changes to pink, red, or brown. Document Released: 10/21/2000 Document Revised: 01/16/2012 Document Reviewed: 06/09/2008 [facility] Patient Information 2014 [place], [place].  _______________________________________________________________________

## 2019-05-07 ENCOUNTER — Ambulatory Visit (HOSPITAL_COMMUNITY)
Admission: RE | Admit: 2019-05-07 | Discharge: 2019-05-07 | Disposition: A | Discharge status: Home / Self Care | Payer: Medicare Other | Source: Ambulatory Visit | Attending: Orthopaedic Surgery | Admitting: Orthopaedic Surgery

## 2019-05-07 ENCOUNTER — Other Ambulatory Visit: Payer: Self-pay

## 2019-05-07 ENCOUNTER — Encounter (HOSPITAL_COMMUNITY)
Admission: RE | Admit: 2019-05-07 | Discharge: 2019-05-07 | Disposition: A | Discharge status: Home / Self Care | Payer: Medicare Other | Source: Ambulatory Visit | Attending: Orthopaedic Surgery | Admitting: Orthopaedic Surgery

## 2019-05-07 ENCOUNTER — Encounter (HOSPITAL_COMMUNITY): Payer: Self-pay

## 2019-05-07 DIAGNOSIS — Z01818 Encounter for other preprocedural examination: Secondary | ICD-10-CM | POA: Insufficient documentation

## 2019-05-07 DIAGNOSIS — M1611 Unilateral primary osteoarthritis, right hip: Secondary | ICD-10-CM | POA: Insufficient documentation

## 2019-05-07 DIAGNOSIS — I4510 Unspecified right bundle-branch block: Secondary | ICD-10-CM | POA: Insufficient documentation

## 2019-05-07 LAB — CBC WITH DIFFERENTIAL/PLATELET
Abs Immature Granulocytes: 0.01 10*3/uL (ref 0.00–0.07)
Basophils Absolute: 0 10*3/uL (ref 0.0–0.1)
Basophils Relative: 1 %
Eosinophils Absolute: 0.6 10*3/uL — ABNORMAL HIGH (ref 0.0–0.5)
Eosinophils Relative: 9 %
HCT: 37.8 % (ref 36.0–46.0)
Hemoglobin: 12.1 g/dL (ref 12.0–15.0)
Immature Granulocytes: 0 %
Lymphocytes Relative: 36 %
Lymphs Abs: 2.4 10*3/uL (ref 0.7–4.0)
MCH: 29.1 pg (ref 26.0–34.0)
MCHC: 32 g/dL (ref 30.0–36.0)
MCV: 90.9 fL (ref 80.0–100.0)
Monocytes Absolute: 0.5 10*3/uL (ref 0.1–1.0)
Monocytes Relative: 8 %
Neutro Abs: 3.1 10*3/uL (ref 1.7–7.7)
Neutrophils Relative %: 46 %
Platelets: 279 10*3/uL (ref 150–400)
RBC: 4.16 MIL/uL (ref 3.87–5.11)
RDW: 12.9 % (ref 11.5–15.5)
WBC: 6.6 10*3/uL (ref 4.0–10.5)
nRBC: 0 % (ref 0.0–0.2)

## 2019-05-07 LAB — BASIC METABOLIC PANEL
Anion gap: 9 (ref 5–15)
BUN: 7 mg/dL — ABNORMAL LOW (ref 8–23)
CO2: 28 mmol/L (ref 22–32)
Calcium: 9.2 mg/dL (ref 8.9–10.3)
Chloride: 99 mmol/L (ref 98–111)
Creatinine, Ser: 0.63 mg/dL (ref 0.44–1.00)
GFR calc Af Amer: >60 mL/min (ref >60)
GFR calc non Af Amer: >60 mL/min (ref >60)
Glucose, Bld: 73 mg/dL (ref 70–99)
Potassium: 4.3 mmol/L (ref 3.5–5.1)
Sodium: 136 mmol/L (ref 135–145)

## 2019-05-07 LAB — PROTIME-INR
INR: 1 (ref 0.8–1.2)
Prothrombin Time: 12.8 seconds (ref 11.4–15.2)

## 2019-05-07 LAB — URINALYSIS, ROUTINE W REFLEX MICROSCOPIC
Bilirubin Urine: NEGATIVE
Glucose, UA: NEGATIVE mg/dL
Hgb urine dipstick: NEGATIVE
Ketones, ur: NEGATIVE mg/dL
Leukocytes,Ua: NEGATIVE
Nitrite: NEGATIVE
Protein, ur: NEGATIVE mg/dL
Specific Gravity, Urine: 1.003 — ABNORMAL LOW (ref 1.005–1.030)
pH: 7 (ref 5.0–8.0)

## 2019-05-07 LAB — ABO/RH: ABO/RH(D): O POS

## 2019-05-07 LAB — APTT: aPTT: 36 seconds (ref 24–36)

## 2019-05-07 LAB — SURGICAL PCR SCREEN
MRSA, PCR: NEGATIVE
Staphylococcus aureus: NEGATIVE

## 2019-05-08 ENCOUNTER — Other Ambulatory Visit: Payer: Self-pay | Admitting: Orthopaedic Surgery

## 2019-05-08 DIAGNOSIS — Z5189 Encounter for other specified aftercare: Secondary | ICD-10-CM

## 2019-05-08 HISTORY — DX: Encounter for other specified aftercare: Z51.89

## 2019-05-08 NOTE — Care Plan (Signed)
Spoke with patient prior to surgery. She plans to discharge to home with son to assist. She will go to OPPT. Rolling walker and 3n1 ordered for delivery to hospital room prior to discharge. Patient and MD in agreement with plan. Choice offered.   Ladell Heads, [place]

## 2019-05-10 ENCOUNTER — Other Ambulatory Visit (HOSPITAL_COMMUNITY)
Admission: RE | Admit: 2019-05-10 | Discharge: 2019-05-10 | Disposition: A | Discharge status: Home / Self Care | Payer: Medicare Other | Source: Ambulatory Visit | Attending: Orthopaedic Surgery | Admitting: Orthopaedic Surgery

## 2019-05-10 DIAGNOSIS — Z01812 Encounter for preprocedural laboratory examination: Secondary | ICD-10-CM | POA: Insufficient documentation

## 2019-05-10 DIAGNOSIS — Z1159 Encounter for screening for other viral diseases: Secondary | ICD-10-CM | POA: Diagnosis not present

## 2019-05-10 LAB — SARS CORONAVIRUS 2 (TAT 6-24 HRS): SARS Coronavirus 2: NEGATIVE

## 2019-05-13 MED ORDER — TRANEXAMIC ACID 1000 MG/10ML IV SOLN
2000.0000 mg | INTRAVENOUS | Status: DC
  Filled 2019-05-13: qty 20

## 2019-05-13 MED ORDER — BUPIVACAINE LIPOSOME 1.3 % IJ SUSP
10.0000 mL | Freq: Once | INTRAMUSCULAR | Status: DC
  Filled 2019-05-13: qty 10

## 2019-05-13 NOTE — H&P (Signed)
TOTAL HIP ADMISSION H&P  Patient is admitted for right total hip arthroplasty.  Subjective:  Chief Complaint: right hip pain  HPI: Tanya Duncan, 73 y.o. female, has a history of pain and functional disability in the right hip(s) due to arthritis and patient has failed non-surgical conservative treatments for greater than 12 weeks to include NSAID's and/or analgesics, corticosteriod injections, flexibility and strengthening excercises, supervised PT with diminished ADL's post treatment, use of assistive devices, weight reduction as appropriate and activity modification.  Onset of symptoms was gradual starting 5 years ago with gradually worsening course since that time.The patient noted no past surgery on the right hip(s).  Patient currently rates pain in the right hip at 10 out of 10 with activity. Patient has night pain, worsening of pain with activity and weight bearing, trendelenberg gait, pain that interfers with activities of daily living and crepitus. Patient has evidence of subchondral cysts, subchondral sclerosis, periarticular osteophytes and joint space narrowing by imaging studies. This condition presents safety issues increasing the risk of falls. There is no current active infection.  There are no active problems to display for this patient.  Past Medical History:  Diagnosis Date  . Anxiety   . Arthritis   . Fibromyalgia   . Pneumonia 2005  . Stomach ulcer 1990    Past Surgical History:  Procedure Laterality Date  . ABDOMINAL HYSTERECTOMY    . LUMBAR FUSION  2015  . PYLOROPLASTY    . TOTAL HIP ARTHROPLASTY  2011  . TOTAL SHOULDER REPLACEMENT  2014    Current Facility-Administered Medications  Medication Dose Route Frequency Provider Last Rate Last Dose  . [START ON 05/14/2019] bupivacaine liposome (EXPAREL) 1.3 % injection 133 mg  10 mL Other Once Melrose Nakayama, MD      . Derrill Memo ON 05/14/2019] tranexamic acid (CYKLOKAPRON) 2,000 mg in sodium chloride 0.9 % 50 mL Topical  Application  3,149 mg Topical To OR Melrose Nakayama, MD       Current Outpatient Medications  Medication Sig Dispense Refill Last Dose  . Ascorbic Acid (VITAMIN C) 1000 MG tablet Take 2,000 mg by mouth daily.     Marland Kitchen b complex vitamins tablet Take 1 tablet by mouth daily.     Marland Kitchen escitalopram (LEXAPRO) 20 MG tablet Take 20 mg by mouth daily.     Marland Kitchen gabapentin (NEURONTIN) 300 MG capsule Take 300 mg by mouth 4 (four) times daily as needed (pain).      Marland Kitchen HYDROcodone-acetaminophen (NORCO/VICODIN) 5-325 MG tablet Take 1-2 tablets by mouth every 6 (six) hours as needed for moderate pain.     Marland Kitchen LORazepam (ATIVAN) 0.5 MG tablet Take 0.5 mg by mouth 2 (two) times daily as needed for anxiety.      . Melatonin 5 MG TABS Take 5 mg by mouth at bedtime.     . METHOCARBAMOL PO Take 1-2 tablets by mouth every 4 (four) hours as needed (muscle spasms).     . Multiple Vitamin (MULTIVITAMIN WITH MINERALS) TABS tablet Take 1 tablet by mouth daily.     . nortriptyline (PAMELOR) 25 MG capsule Take 50 mg by mouth at bedtime.      Marland Kitchen omeprazole (PRILOSEC) 40 MG capsule TAKE 1 CAPSULE BY MOUTH DAILY. (Patient taking differently: Take 40 mg by mouth at bedtime. ) 90 capsule 3   . psyllium (REGULOID) 0.52 g capsule Take 0.52 g by mouth daily.     . traMADol (ULTRAM) 50 MG tablet Take 100 mg by mouth every 6 (six)  hours as needed for moderate pain.     Marland Kitchen zolpidem (AMBIEN) 10 MG tablet Take 10 mg by mouth at bedtime as needed for sleep.      . diphenoxylate-atropine (LOMOTIL) 2.5-0.025 MG tablet Take 2 tablets by mouth 4 (four) times daily as needed for diarrhea or loose stools. (Patient not taking: Reported on 05/01/2019) 30 tablet 1 Not Taking at Unknown time   Allergies  Allergen Reactions  . Indocin [Indomethacin]     Dizziness  . Sulfa Antibiotics Nausea And Vomiting  . Avelox [Moxifloxacin Hcl In Nacl] Palpitations    Social History   Tobacco Use  . Smoking status: Never Smoker  . Smokeless tobacco: Never Used   Substance Use Topics  . Alcohol use: Yes    Comment: occasionally    Family History  Problem Relation Age of Onset  . Colon cancer Father 37     Review of Systems  Musculoskeletal: Positive for joint pain.       Right hip  All other systems reviewed and are negative.   Objective:  Physical Exam  Constitutional: She is oriented to person, place, and time. She appears well-developed and well-nourished.  HENT:  Head: Normocephalic and atraumatic.  Neck: Normal range of motion.  Cardiovascular: Normal rate and regular rhythm.  Respiratory: Effort normal.  GI: Soft.  Musculoskeletal:     Comments: Right hip motion is extremely painful in internal rotation.  Straight leg raise is negative.  Leg lengths look roughly equal though she has the sensation that the right side is slightly short.  Sensation and motor function remain intact in her feet with palpable pulses on both sides.    Neurological: She is alert and oriented to person, place, and time.  Skin: Skin is warm and dry.  Psychiatric: She has a normal mood and affect. Her behavior is normal. Judgment and thought content normal.    Vital signs in last 24 hours:    Labs:   Estimated body mass index is 26.38 kg/m as calculated from the following:   Height as of 05/07/19: 5\' 3"  (1.6 m).   Weight as of 05/07/19: 67.5 kg.   Imaging Review Plain radiographs demonstrate severe degenerative joint disease of the right hip(s). The bone quality appears to be good for age and reported activity level.   Assessment/Plan:  End stage arthritis, right hip(s)  The patient history, physical examination, clinical judgement of the provider and imaging studies are consistent with end stage degenerative joint disease of the right hip(s) and total hip arthroplasty is deemed medically necessary. The treatment options including medical management, injection therapy, arthroscopy and arthroplasty were discussed at length. The risks and benefits  of total hip arthroplasty were presented and reviewed. The risks due to aseptic loosening, infection, stiffness, dislocation/subluxation,  thromboembolic complications and other imponderables were discussed.  The patient acknowledged the explanation, agreed to proceed with the plan and consent was signed. Patient is being admitted for inpatient treatment for surgery, pain control, PT, OT, prophylactic antibiotics, VTE prophylaxis, progressive ambulation and ADL's and discharge planning.The patient is planning to be discharged home with home health services

## 2019-05-14 ENCOUNTER — Other Ambulatory Visit: Payer: Self-pay

## 2019-05-14 ENCOUNTER — Inpatient Hospital Stay (HOSPITAL_COMMUNITY): Payer: Medicare Other

## 2019-05-14 ENCOUNTER — Inpatient Hospital Stay (HOSPITAL_COMMUNITY)
Admission: RE | Admit: 2019-05-14 | Discharge: 2019-05-16 | DRG: 470 | Disposition: A | Discharge status: Home Health | Payer: Medicare Other | Source: Ambulatory Visit | Attending: Orthopaedic Surgery | Admitting: Orthopaedic Surgery

## 2019-05-14 ENCOUNTER — Inpatient Hospital Stay (HOSPITAL_COMMUNITY): Payer: Medicare Other | Admitting: Certified Registered Nurse Anesthetist

## 2019-05-14 ENCOUNTER — Encounter (HOSPITAL_COMMUNITY)
Admission: RE | Disposition: A | Discharge status: Home Health | Payer: Self-pay | Source: Ambulatory Visit | Attending: Orthopaedic Surgery

## 2019-05-14 ENCOUNTER — Inpatient Hospital Stay (HOSPITAL_COMMUNITY): Payer: Medicare Other | Admitting: Physician Assistant

## 2019-05-14 ENCOUNTER — Encounter (HOSPITAL_COMMUNITY): Payer: Self-pay | Admitting: Emergency Medicine

## 2019-05-14 DIAGNOSIS — Z888 Allergy status to other drugs, medicaments and biological substances status: Secondary | ICD-10-CM

## 2019-05-14 DIAGNOSIS — Z96619 Presence of unspecified artificial shoulder joint: Secondary | ICD-10-CM | POA: Diagnosis present

## 2019-05-14 DIAGNOSIS — Z96659 Presence of unspecified artificial knee joint: Secondary | ICD-10-CM | POA: Diagnosis present

## 2019-05-14 DIAGNOSIS — Z882 Allergy status to sulfonamides status: Secondary | ICD-10-CM

## 2019-05-14 DIAGNOSIS — M797 Fibromyalgia: Secondary | ICD-10-CM | POA: Diagnosis present

## 2019-05-14 DIAGNOSIS — Z79899 Other long term (current) drug therapy: Secondary | ICD-10-CM

## 2019-05-14 DIAGNOSIS — F419 Anxiety disorder, unspecified: Secondary | ICD-10-CM | POA: Diagnosis present

## 2019-05-14 DIAGNOSIS — R32 Unspecified urinary incontinence: Secondary | ICD-10-CM | POA: Diagnosis not present

## 2019-05-14 DIAGNOSIS — M1611 Unilateral primary osteoarthritis, right hip: Principal | ICD-10-CM | POA: Diagnosis present

## 2019-05-14 DIAGNOSIS — Z419 Encounter for procedure for purposes other than remedying health state, unspecified: Secondary | ICD-10-CM

## 2019-05-14 DIAGNOSIS — M1990 Unspecified osteoarthritis, unspecified site: Secondary | ICD-10-CM | POA: Diagnosis present

## 2019-05-14 DIAGNOSIS — Z79891 Long term (current) use of opiate analgesic: Secondary | ICD-10-CM

## 2019-05-14 HISTORY — PX: TOTAL HIP ARTHROPLASTY: SHX124

## 2019-05-14 LAB — TYPE AND SCREEN
ABO/RH(D): O POS
Antibody Screen: NEGATIVE

## 2019-05-14 SURGERY — ARTHROPLASTY, HIP, TOTAL, ANTERIOR APPROACH
Anesthesia: Spinal | Laterality: Right

## 2019-05-14 MED ORDER — MIDAZOLAM HCL 2 MG/2ML IJ SOLN
INTRAMUSCULAR | Status: DC | PRN
  Administered 2019-05-14 (×2): 1 mg via INTRAVENOUS

## 2019-05-14 MED ORDER — METOCLOPRAMIDE HCL 5 MG/ML IJ SOLN: 5.0000 mg | Freq: Three times a day (TID) | INTRAMUSCULAR | Status: DC | PRN

## 2019-05-14 MED ORDER — ACETAMINOPHEN 325 MG PO TABS: 325.0000 mg | ORAL_TABLET | Freq: Four times a day (QID) | ORAL | Status: DC | PRN

## 2019-05-14 MED ORDER — CEFAZOLIN SODIUM-DEXTROSE 2-4 GM/100ML-% IV SOLN
2.0000 g | INTRAVENOUS | Status: AC
  Administered 2019-05-14: 2 g via INTRAVENOUS
  Filled 2019-05-14: qty 100

## 2019-05-14 MED ORDER — BUPIVACAINE LIPOSOME 1.3 % IJ SUSP
INTRAMUSCULAR | Status: DC | PRN
  Administered 2019-05-14: 10 mL

## 2019-05-14 MED ORDER — DIPHENHYDRAMINE HCL 12.5 MG/5ML PO ELIX: 12.5000 mg | ORAL_SOLUTION | ORAL | Status: DC | PRN

## 2019-05-14 MED ORDER — CHLORHEXIDINE GLUCONATE 4 % EX LIQD: 60.0000 mL | Freq: Once | CUTANEOUS | Status: DC

## 2019-05-14 MED ORDER — PSYLLIUM 0.52 G PO CAPS: 0.5200 g | ORAL_CAPSULE | Freq: Every day | ORAL | Status: DC

## 2019-05-14 MED ORDER — LORAZEPAM 0.5 MG PO TABS
0.5000 mg | ORAL_TABLET | Freq: Two times a day (BID) | ORAL | Status: DC | PRN
  Administered 2019-05-14 – 2019-05-16 (×2): 0.5 mg via ORAL
  Filled 2019-05-14 (×2): qty 1

## 2019-05-14 MED ORDER — POVIDONE-IODINE 10 % EX SWAB
2.0000 "application " | Freq: Once | CUTANEOUS | Status: AC
  Administered 2019-05-14: 2 via TOPICAL

## 2019-05-14 MED ORDER — KETOROLAC TROMETHAMINE 15 MG/ML IJ SOLN
7.5000 mg | Freq: Four times a day (QID) | INTRAMUSCULAR | Status: AC
  Administered 2019-05-14 – 2019-05-15 (×4): 7.5 mg via INTRAVENOUS
  Filled 2019-05-14 (×4): qty 1

## 2019-05-14 MED ORDER — PHENYLEPHRINE HCL (PRESSORS) 10 MG/ML IV SOLN
INTRAVENOUS | Status: AC
  Filled 2019-05-14: qty 1

## 2019-05-14 MED ORDER — DOCUSATE SODIUM 100 MG PO CAPS
100.0000 mg | ORAL_CAPSULE | Freq: Two times a day (BID) | ORAL | Status: DC
  Administered 2019-05-14 – 2019-05-16 (×4): 100 mg via ORAL
  Filled 2019-05-14 (×4): qty 1

## 2019-05-14 MED ORDER — PHENOL 1.4 % MT LIQD
1.0000 | OROMUCOSAL | Status: DC | PRN
  Filled 2019-05-14: qty 177

## 2019-05-14 MED ORDER — LIDOCAINE HCL (CARDIAC) PF 100 MG/5ML IV SOSY
PREFILLED_SYRINGE | INTRAVENOUS | Status: DC | PRN
  Administered 2019-05-14: 40 mg via INTRAVENOUS

## 2019-05-14 MED ORDER — TRANEXAMIC ACID-NACL 1000-0.7 MG/100ML-% IV SOLN
1000.0000 mg | INTRAVENOUS | Status: AC
  Administered 2019-05-14: 1000 mg via INTRAVENOUS
  Filled 2019-05-14: qty 100

## 2019-05-14 MED ORDER — METOCLOPRAMIDE HCL 5 MG PO TABS: 5.0000 mg | ORAL_TABLET | Freq: Three times a day (TID) | ORAL | Status: DC | PRN

## 2019-05-14 MED ORDER — LACTATED RINGERS IV SOLN
INTRAVENOUS | Status: DC
  Administered 2019-05-14: 22:00:00 via INTRAVENOUS

## 2019-05-14 MED ORDER — ONDANSETRON HCL 4 MG/2ML IJ SOLN
4.0000 mg | Freq: Four times a day (QID) | INTRAMUSCULAR | Status: DC | PRN
  Administered 2019-05-15: 4 mg via INTRAVENOUS
  Filled 2019-05-14: qty 2

## 2019-05-14 MED ORDER — METHOCARBAMOL 1000 MG/10ML IJ SOLN
500.0000 mg | Freq: Four times a day (QID) | INTRAVENOUS | Status: DC | PRN
  Filled 2019-05-14: qty 5

## 2019-05-14 MED ORDER — BISACODYL 5 MG PO TBEC: 5.0000 mg | DELAYED_RELEASE_TABLET | Freq: Every day | ORAL | Status: DC | PRN

## 2019-05-14 MED ORDER — TRANEXAMIC ACID 1000 MG/10ML IV SOLN
INTRAVENOUS | Status: DC | PRN
  Administered 2019-05-14: 2000 mg via TOPICAL

## 2019-05-14 MED ORDER — HYDROMORPHONE HCL 1 MG/ML IJ SOLN: 0.2500 mg | INTRAMUSCULAR | Status: DC | PRN

## 2019-05-14 MED ORDER — FENTANYL CITRATE (PF) 100 MCG/2ML IJ SOLN
INTRAMUSCULAR | Status: AC
  Filled 2019-05-14: qty 2

## 2019-05-14 MED ORDER — LACTATED RINGERS IV SOLN
INTRAVENOUS | Status: DC
  Administered 2019-05-14 (×3): via INTRAVENOUS

## 2019-05-14 MED ORDER — ALUM & MAG HYDROXIDE-SIMETH 200-200-20 MG/5ML PO SUSP: 30.0000 mL | ORAL | Status: DC | PRN

## 2019-05-14 MED ORDER — APIXABAN 2.5 MG PO TABS
2.5000 mg | ORAL_TABLET | Freq: Two times a day (BID) | ORAL | Status: DC
  Administered 2019-05-15 – 2019-05-16 (×3): 2.5 mg via ORAL
  Filled 2019-05-14 (×3): qty 1

## 2019-05-14 MED ORDER — ONDANSETRON HCL 4 MG/2ML IJ SOLN
INTRAMUSCULAR | Status: AC
  Filled 2019-05-14: qty 2

## 2019-05-14 MED ORDER — TRANEXAMIC ACID-NACL 1000-0.7 MG/100ML-% IV SOLN
1000.0000 mg | Freq: Once | INTRAVENOUS | Status: AC
  Administered 2019-05-14: 1000 mg via INTRAVENOUS
  Filled 2019-05-14: qty 100

## 2019-05-14 MED ORDER — PROPOFOL 10 MG/ML IV BOLUS
INTRAVENOUS | Status: AC
  Filled 2019-05-14: qty 80

## 2019-05-14 MED ORDER — NORTRIPTYLINE HCL 25 MG PO CAPS
50.0000 mg | ORAL_CAPSULE | Freq: Every day | ORAL | Status: DC
  Administered 2019-05-14 – 2019-05-15 (×2): 50 mg via ORAL
  Filled 2019-05-14 (×2): qty 2

## 2019-05-14 MED ORDER — ZOLPIDEM TARTRATE 5 MG PO TABS
5.0000 mg | ORAL_TABLET | Freq: Every evening | ORAL | Status: DC | PRN
  Administered 2019-05-15 (×2): 5 mg via ORAL
  Filled 2019-05-14 (×2): qty 1

## 2019-05-14 MED ORDER — ACETAMINOPHEN 500 MG PO TABS
500.0000 mg | ORAL_TABLET | Freq: Four times a day (QID) | ORAL | Status: AC
  Administered 2019-05-14 (×2): 500 mg via ORAL
  Filled 2019-05-14 (×2): qty 1

## 2019-05-14 MED ORDER — PROMETHAZINE HCL 25 MG/ML IJ SOLN: 6.2500 mg | INTRAMUSCULAR | Status: DC | PRN

## 2019-05-14 MED ORDER — MENTHOL 3 MG MT LOZG: 1.0000 | LOZENGE | OROMUCOSAL | Status: DC | PRN

## 2019-05-14 MED ORDER — GABAPENTIN 300 MG PO CAPS
300.0000 mg | ORAL_CAPSULE | Freq: Four times a day (QID) | ORAL | Status: DC | PRN
  Administered 2019-05-14 (×2): 300 mg via ORAL
  Filled 2019-05-14 (×2): qty 1

## 2019-05-14 MED ORDER — CEFAZOLIN SODIUM-DEXTROSE 2-4 GM/100ML-% IV SOLN
2.0000 g | Freq: Four times a day (QID) | INTRAVENOUS | Status: AC
  Administered 2019-05-14 (×2): 2 g via INTRAVENOUS
  Filled 2019-05-14 (×2): qty 100

## 2019-05-14 MED ORDER — FENTANYL CITRATE (PF) 100 MCG/2ML IJ SOLN
INTRAMUSCULAR | Status: DC | PRN
  Administered 2019-05-14: 100 ug via INTRAVENOUS

## 2019-05-14 MED ORDER — HYDROCODONE-ACETAMINOPHEN 7.5-325 MG PO TABS
1.0000 | ORAL_TABLET | ORAL | Status: DC | PRN
  Administered 2019-05-14 (×2): 2 via ORAL
  Administered 2019-05-14 (×2): 1 via ORAL
  Administered 2019-05-15 (×2): 2 via ORAL
  Filled 2019-05-14 (×2): qty 1
  Filled 2019-05-14 (×4): qty 2

## 2019-05-14 MED ORDER — BUPIVACAINE IN DEXTROSE 0.75-8.25 % IT SOLN
INTRATHECAL | Status: DC | PRN
  Administered 2019-05-14: 1.8 mL via INTRATHECAL

## 2019-05-14 MED ORDER — ESCITALOPRAM OXALATE 20 MG PO TABS
20.0000 mg | ORAL_TABLET | Freq: Every day | ORAL | Status: DC
  Administered 2019-05-15 – 2019-05-16 (×2): 20 mg via ORAL
  Filled 2019-05-14 (×2): qty 1

## 2019-05-14 MED ORDER — PSYLLIUM 95 % PO PACK
1.0000 | PACK | Freq: Every day | ORAL | Status: DC
  Administered 2019-05-15 – 2019-05-16 (×2): 1 via ORAL
  Filled 2019-05-14 (×3): qty 1

## 2019-05-14 MED ORDER — LIDOCAINE 2% (20 MG/ML) 5 ML SYRINGE
INTRAMUSCULAR | Status: AC
  Filled 2019-05-14: qty 5

## 2019-05-14 MED ORDER — PROPOFOL 500 MG/50ML IV EMUL
INTRAVENOUS | Status: DC | PRN
  Administered 2019-05-14: 75 ug/kg/min via INTRAVENOUS

## 2019-05-14 MED ORDER — BUPIVACAINE-EPINEPHRINE (PF) 0.25% -1:200000 IJ SOLN
INTRAMUSCULAR | Status: DC | PRN
  Administered 2019-05-14: 30 mL via PERINEURAL

## 2019-05-14 MED ORDER — MELATONIN 5 MG PO TABS
5.0000 mg | ORAL_TABLET | Freq: Every day | ORAL | Status: DC
  Administered 2019-05-14: 5 mg via ORAL
  Filled 2019-05-14 (×2): qty 1

## 2019-05-14 MED ORDER — SODIUM CHLORIDE 0.9 % IV SOLN
INTRAVENOUS | Status: DC | PRN
  Administered 2019-05-14: 40 ug/min via INTRAVENOUS

## 2019-05-14 MED ORDER — PANTOPRAZOLE SODIUM 40 MG PO TBEC
80.0000 mg | DELAYED_RELEASE_TABLET | Freq: Every day | ORAL | Status: DC
  Administered 2019-05-14 – 2019-05-16 (×3): 80 mg via ORAL
  Filled 2019-05-14 (×3): qty 2

## 2019-05-14 MED ORDER — HYDROCODONE-ACETAMINOPHEN 5-325 MG PO TABS: 1.0000 | ORAL_TABLET | ORAL | Status: DC | PRN

## 2019-05-14 MED ORDER — MIDAZOLAM HCL 2 MG/2ML IJ SOLN
INTRAMUSCULAR | Status: AC
  Filled 2019-05-14: qty 2

## 2019-05-14 MED ORDER — BUPIVACAINE-EPINEPHRINE (PF) 0.25% -1:200000 IJ SOLN
INTRAMUSCULAR | Status: AC
  Filled 2019-05-14: qty 30

## 2019-05-14 MED ORDER — ONDANSETRON HCL 4 MG/2ML IJ SOLN
INTRAMUSCULAR | Status: DC | PRN
  Administered 2019-05-14: 4 mg via INTRAVENOUS

## 2019-05-14 MED ORDER — PROPOFOL 10 MG/ML IV BOLUS
INTRAVENOUS | Status: DC | PRN
  Administered 2019-05-14: 30 mg via INTRAVENOUS

## 2019-05-14 MED ORDER — 0.9 % SODIUM CHLORIDE (POUR BTL) OPTIME
TOPICAL | Status: DC | PRN
  Administered 2019-05-14: 1000 mL

## 2019-05-14 MED ORDER — ONDANSETRON HCL 4 MG PO TABS: 4.0000 mg | ORAL_TABLET | Freq: Four times a day (QID) | ORAL | Status: DC | PRN

## 2019-05-14 MED ORDER — METHOCARBAMOL 500 MG PO TABS
500.0000 mg | ORAL_TABLET | Freq: Four times a day (QID) | ORAL | Status: DC | PRN
  Administered 2019-05-14 – 2019-05-16 (×6): 500 mg via ORAL
  Filled 2019-05-14 (×6): qty 1

## 2019-05-14 MED ORDER — MORPHINE SULFATE (PF) 2 MG/ML IV SOLN
0.5000 mg | INTRAVENOUS | Status: DC | PRN
  Administered 2019-05-14 – 2019-05-15 (×3): 1 mg via INTRAVENOUS
  Filled 2019-05-14 (×3): qty 1

## 2019-05-14 SURGICAL SUPPLY — 48 items
BAG DECANTER FOR FLEXI CONT (MISCELLANEOUS) ×3 IMPLANT
BALL HIP CERAMIC (Hips) IMPLANT
BLADE SAW SGTL 18X1.27X75 (BLADE) ×2 IMPLANT
BLADE SAW SGTL 18X1.27X75MM (BLADE) ×1
BLADE SURG SZ10 CARB STEEL (BLADE) ×6 IMPLANT
CELLS DAT CNTRL 66122 CELL SVR (MISCELLANEOUS) ×1 IMPLANT
COVER PERINEAL POST (MISCELLANEOUS) ×3 IMPLANT
COVER SURGICAL LIGHT HANDLE (MISCELLANEOUS) ×3 IMPLANT
COVER WAND RF STERILE (DRAPES) IMPLANT
DECANTER SPIKE VIAL GLASS SM (MISCELLANEOUS) ×3 IMPLANT
DRAPE IMP U-DRAPE 54X76 (DRAPES) ×3 IMPLANT
DRAPE STERI IOBAN 125X83 (DRAPES) ×3 IMPLANT
DRAPE U-SHAPE 47X51 STRL (DRAPES) ×6 IMPLANT
DRESSING AQUACEL AG SP 3.5X10 (GAUZE/BANDAGES/DRESSINGS) IMPLANT
DRSG AQUACEL AG ADV 3.5X10 (GAUZE/BANDAGES/DRESSINGS) ×3 IMPLANT
DRSG AQUACEL AG SP 3.5X10 (GAUZE/BANDAGES/DRESSINGS) ×3
DURAPREP 26ML APPLICATOR (WOUND CARE) ×3 IMPLANT
ELECT BLADE TIP CTD 4 INCH (ELECTRODE) ×3 IMPLANT
ELECT REM PT RETURN 15FT ADLT (MISCELLANEOUS) ×3 IMPLANT
ELIMINATOR HOLE APEX DEPUY (Hips) ×2 IMPLANT
GLOVE BIO SURGEON STRL SZ8 (GLOVE) ×6 IMPLANT
GLOVE BIOGEL PI IND STRL 8 (GLOVE) ×2 IMPLANT
GLOVE BIOGEL PI INDICATOR 8 (GLOVE) ×4
GOWN STRL REIN XL XLG (GOWN DISPOSABLE) ×6 IMPLANT
HEAD FEMORAL 32 CERAMIC (Hips) ×2 IMPLANT
HIP BALL CERAMIC (Hips) ×3 IMPLANT
HOLDER FOLEY CATH W/STRAP (MISCELLANEOUS) ×3 IMPLANT
KIT TURNOVER KIT A (KITS) IMPLANT
LINER ACETABULAR 32X50 (Liner) ×2 IMPLANT
LINER PINN ACET GRIP 50X100 ×2 IMPLANT
MANIFOLD NEPTUNE II (INSTRUMENTS) ×3 IMPLANT
NS IRRIG 1000ML POUR BTL (IV SOLUTION) ×3 IMPLANT
PACK ANTERIOR HIP CUSTOM (KITS) ×3 IMPLANT
PROTECTOR NERVE ULNAR (MISCELLANEOUS) ×3 IMPLANT
RETRACTOR WND ALEXIS 18 MED (MISCELLANEOUS) ×1 IMPLANT
RTRCTR WOUND ALEXIS 18CM MED (MISCELLANEOUS) ×3
STEM FEMORAL SZ 5MM STD ACTIS (Stem) ×2 IMPLANT
SUT ETHIBOND NAB CT1 #1 30IN (SUTURE) ×6 IMPLANT
SUT VIC AB 1 CT1 36 (SUTURE) ×3 IMPLANT
SUT VIC AB 2-0 CT1 27 (SUTURE) ×2
SUT VIC AB 2-0 CT1 TAPERPNT 27 (SUTURE) ×1 IMPLANT
SUT VIC AB 3-0 PS2 18 (SUTURE) ×2
SUT VIC AB 3-0 PS2 18XBRD (SUTURE) ×1 IMPLANT
SUT VLOC 180 0 24IN GS25 (SUTURE) ×3 IMPLANT
SYR 50ML LL SCALE MARK (SYRINGE) ×3 IMPLANT
TRAY FOLEY MTR SLVR 14FR STAT (SET/KITS/TRAYS/PACK) ×2 IMPLANT
TRAY FOLEY MTR SLVR 16FR STAT (SET/KITS/TRAYS/PACK) ×1 IMPLANT
YANKAUER SUCT BULB TIP 10FT TU (MISCELLANEOUS) ×3 IMPLANT

## 2019-05-14 NOTE — Transfer of Care (Signed)
Immediate Anesthesia Transfer of Care Note  Patient: [name]  Procedure(s) Performed: Right Anterior Hip Arthroplasty (Right )  Patient Location: PACU  Anesthesia Type:Spinal  Level of Consciousness: awake, alert , oriented and patient cooperative  Airway & Oxygen Therapy: Patient Spontanous Breathing and Patient connected to nasal cannula oxygen  Post-op Assessment: Report given to RN and Post -op Vital signs reviewed and stable  Post vital signs: Reviewed and stable  Last Vitals:  Vitals Value Taken Time  BP 104/65 05/14/19 1200  Temp    Pulse 82 05/14/19 1159  Resp 12 05/14/19 1200  SpO2 100 % 05/14/19 1159  Vitals shown include unvalidated device data.  Last Pain:  Vitals:   05/14/19 0802  TempSrc: Oral  PainSc: 7       Patients Stated Pain Goal: 4 (86/76/19 5093)  Complications: No apparent anesthesia complications

## 2019-05-14 NOTE — Op Note (Signed)

## 2019-05-14 NOTE — Evaluation (Signed)
Physical Therapy Evaluation Patient Details Name: Tanya Duncan MRN: [id number] DOB: [birth year] Today's Date: 05/14/2019   History of Present Illness  73 yo female s/p R DA-THA on 05/14/19. PMH includes anxiety, fibromyalgia, PNA, lumbar fusion, L THA, R shoulder arthroplasty.  Clinical Impression   Pt presents with R hip pain, decreased R hip strength, increased time and effort to perform mobility tasks, and decreased activity tolerance due to R hip and back pain. Pt to benefit from acute PT to address deficits. Pt ambulated hallway distance with min guard assist and RW, verbal cuing for form and safety provided throughout. Pt educated on ankle pumps (20/hour) to perform this afternoon/evening to increase circulation, to pt's tolerance and limited by pain. PT to progress mobility as tolerated, and will continue to follow acutely.        Follow Up Recommendations Follow surgeon's recommendation for DC plan and follow-up therapies;Supervision for mobility/OOB(OPPT)    Equipment Recommendations  Rolling walker with 5" wheels;3in1 (PT)    Recommendations for Other Services       Precautions / Restrictions Precautions Precautions: Fall Restrictions Weight Bearing Restrictions: No Other Position/Activity Restrictions: WBAT      Mobility  Bed Mobility Overal bed mobility: Needs Assistance Bed Mobility: Supine to Sit     Supine to sit: Min guard;HOB elevated     General bed mobility comments: Min guard for safety. Verbal cuing for sequencing, increased time and effort.  Transfers Overall transfer level: Needs assistance Equipment used: Rolling walker (2 wheeled) Transfers: Sit to/from Stand Sit to Stand: Min guard;From elevated surface         General transfer comment: Min guard for safety. Verbal cuing for hand placement when rising.  Ambulation/Gait Ambulation/Gait assistance: Min guard Gait Distance (Feet): 100 Feet Assistive device: Rolling walker (2 wheeled) Gait  Pattern/deviations: Step-to pattern;Step-through pattern;Trunk flexed;Decreased stride length Gait velocity: decr   General Gait Details: Min guard for safety. Verbal cuing for placement in RW, upright posture, sequencing.  Stairs            Wheelchair Mobility    Modified Rankin (Stroke Patients Only)       Balance Overall balance assessment: Mild deficits observed, not formally tested                                           Pertinent Vitals/Pain Pain Assessment: 0-10 Pain Score: 7  Pain Location: R hip, with ambulation Pain Descriptors / Indicators: Sore Pain Intervention(s): Limited activity within patient's tolerance;Monitored during session;Premedicated before session;Repositioned;Ice applied    Home Living Family/patient expects to be discharged to:: Private residence Living Arrangements: Alone Available Help at Discharge: Family;Available 24 hours/day(son to stay with her post-acutely) Type of Home: House Home Access: Stairs to enter Entrance Stairs-Rails: Left Entrance Stairs-Number of Steps: 2 Home Layout: One level(with 2 steps with L handrail inside) Home Equipment: None      Prior Function Level of Independence: Independent         Comments: Pt enjoys gardening     Hand Dominance   Dominant Hand: Right    Extremity/Trunk Assessment   Upper Extremity Assessment Upper Extremity Assessment: Overall WFL for tasks assessed    Lower Extremity Assessment Lower Extremity Assessment: Overall WFL for tasks assessed;RLE deficits/detail RLE Deficits / Details: suspected post-surgical R hip weakness; able to perform ankle pumps, quad set, heel slide RLE Sensation: WNL  Cervical / Trunk Assessment Cervical / Trunk Assessment: Normal  Communication   Communication: No difficulties  Cognition Arousal/Alertness: Awake/alert Behavior During Therapy: WFL for tasks assessed/performed Overall Cognitive Status: Within Functional  Limits for tasks assessed                                 General Comments: Pt somewhat impulsive with mobility      General Comments      Exercises     Assessment/Plan    PT Assessment Patient needs continued PT services  PT Problem List Decreased strength;Decreased mobility;Decreased activity tolerance;Decreased balance;Decreased knowledge of use of DME;Pain       PT Treatment Interventions DME instruction;Therapeutic activities;Gait training;Patient/family education;Therapeutic exercise;Balance training;Stair training;Functional mobility training    PT Goals (Current goals can be found in the Care Plan section)  Acute Rehab PT Goals Patient Stated Goal: go home PT Goal Formulation: With patient Time For Goal Achievement: 05/21/19 Potential to Achieve Goals: Good    Frequency 7X/week   Barriers to discharge        Co-evaluation               AM-PAC PT "6 Clicks" Mobility  Outcome Measure Help needed turning from your back to your side while in a flat bed without using bedrails?: A Little Help needed moving from lying on your back to sitting on the side of a flat bed without using bedrails?: A Little Help needed moving to and from a bed to a chair (including a wheelchair)?: A Little Help needed standing up from a chair using your arms (e.g., wheelchair or bedside chair)?: A Little Help needed to walk in hospital room?: A Little Help needed climbing 3-5 steps with a railing? : A Little 6 Click Score: 18    End of Session Equipment Utilized During Treatment: Gait belt Activity Tolerance: Patient tolerated treatment well Patient left: in chair;with call bell/phone within reach;with chair alarm set;with SCD's reapplied Nurse Communication: Mobility status PT Visit Diagnosis: Other abnormalities of gait and mobility (R26.89);Difficulty in walking, not elsewhere classified (R26.2)    Time: 1548-1600 PT Time Calculation (min) (ACUTE ONLY): 12  min   Charges:   PT Evaluation $PT Eval Low Complexity: 1 Low         Frederick Klinger Conception Chancy, PT Acute Rehabilitation Services Pager [phone]  Office [phone]  Shantae Vantol D Elonda Husky 05/14/2019, 4:48 PM

## 2019-05-14 NOTE — Anesthesia Postprocedure Evaluation (Signed)
Anesthesia Post Note  Patient: Tanya Duncan  Procedure(s) Performed: Right Anterior Hip Arthroplasty (Right )     Patient location during evaluation: PACU Anesthesia Type: Spinal Level of consciousness: oriented and awake and alert Pain management: pain level controlled Vital Signs Assessment: post-procedure vital signs reviewed and stable Respiratory status: spontaneous breathing, respiratory function stable and patient connected to nasal cannula oxygen Cardiovascular status: blood pressure returned to baseline and stable Postop Assessment: no headache, no backache and no apparent nausea or vomiting Anesthetic complications: no    Last Vitals:  Vitals:   05/14/19 1215 05/14/19 1230  BP: 105/64 101/67  Pulse: 81 77  Resp: 16 16  Temp:    SpO2: 100% 100%    Last Pain:  Vitals:   05/14/19 1230  TempSrc:   PainSc: Asleep                 Jarmel Linhardt S

## 2019-05-14 NOTE — Care Plan (Signed)
Ortho Bundle Case Management Note  Patient Details  Name: Tanya Duncan MRN: [id number] Date of Birth: February 21, 1946   Spoke with patient prior to surgery. She plans to discharge to home with son to assist. She will go to OPPT. Rolling walker and 3n1 ordered for delivery to hospital room prior to discharge. Patient and MD in agreement with plan. Choice offered.                     DME Arranged:  Bedside commode, Walker rolling DME Agency:  [employer]  HH Arranged:    [place] Agency:     Additional Comments: Please contact me with any questions of if this plan should need to change.  Ladell Heads,  [place] [facility]  [phone] 05/14/2019, 8:31 AM

## 2019-05-14 NOTE — Anesthesia Preprocedure Evaluation (Signed)
Anesthesia Evaluation  Patient identified by MRN, date of birth, ID band Patient awake    Reviewed: Allergy & Precautions, NPO status , Patient's Chart, lab work & pertinent test results  Airway Mallampati: II  TM Distance: >3 FB Neck ROM: Full    Dental no notable dental hx.    Pulmonary neg pulmonary ROS,    Pulmonary exam normal breath sounds clear to auscultation       Cardiovascular negative cardio ROS Normal cardiovascular exam Rhythm:Regular Rate:Normal     Neuro/Psych negative neurological ROS  negative psych ROS   GI/Hepatic negative GI ROS, Neg liver ROS,   Endo/Other  negative endocrine ROS  Renal/GU negative Renal ROS  negative genitourinary   Musculoskeletal  (+) Arthritis ,   Abdominal   Peds negative pediatric ROS (+)  Hematology negative hematology ROS (+)   Anesthesia Other Findings   Reproductive/Obstetrics negative OB ROS                             Anesthesia Physical Anesthesia Plan  ASA: II  Anesthesia Plan: Spinal   Post-op Pain Management:    Induction: Intravenous  PONV Risk Score and Plan: 2 and Ondansetron and Dexamethasone  Airway Management Planned: Simple Face Mask  Additional Equipment:   Intra-op Plan:   Post-operative Plan:   Informed Consent: I have reviewed the patients History and Physical, chart, labs and discussed the procedure including the risks, benefits and alternatives for the proposed anesthesia with the patient or authorized representative who has indicated his/her understanding and acceptance.     Dental advisory given  Plan Discussed with: CRNA and Surgeon  Anesthesia Plan Comments: (Will proceed to [place] if previous back surgery prevents SAB placement)        Anesthesia Quick Evaluation

## 2019-05-14 NOTE — Discharge Instructions (Signed)
Information on my medicine - ELIQUIS (apixaban)  This medication education was reviewed with me or my healthcare representative as part of my discharge preparation.  The pharmacist that spoke with me during my hospital stay was:    Why was Eliquis prescribed for you? Eliquis was prescribed for you to reduce the risk of blood clots forming after orthopedic surgery.    What do You need to know about Eliquis? Take your Eliquis TWICE DAILY - one tablet in the morning and one tablet in the evening with or without food.  It would be best to take the dose about the same time each day.  If you have difficulty swallowing the tablet whole please discuss with your pharmacist how to take the medication safely.  Take Eliquis exactly as prescribed by your doctor and DO NOT stop taking Eliquis without talking to the doctor who prescribed the medication.  Stopping without other medication to take the place of Eliquis may increase your risk of developing a clot.  After discharge, you should have regular check-up appointments with your healthcare provider that is prescribing your Eliquis.  What do you do if you miss a dose? If a dose of ELIQUIS is not taken at the scheduled time, take it as soon as possible on the same day and twice-daily administration should be resumed.  The dose should not be doubled to make up for a missed dose.  Do not take more than one tablet of ELIQUIS at the same time.  Important Safety Information A possible side effect of Eliquis is bleeding. You should call your healthcare provider right away if you experience any of the following: Bleeding from an injury or your nose that does not stop. Unusual colored urine (red or dark brown) or unusual colored stools (red or black). Unusual bruising for unknown reasons. A serious fall or if you hit your head (even if there is no bleeding).  Some medicines may interact with Eliquis and might increase your risk of bleeding or  clotting while on Eliquis. To help avoid this, consult your healthcare provider or pharmacist prior to using any new prescription or non-prescription medications, including herbals, vitamins, non-steroidal anti-inflammatory drugs (NSAIDs) and supplements.  This website has more information on Eliquis (apixaban): http://www.eliquis.com/eliquis/home      Dr. Brian Swinteck Joint Replacement Specialist [facility] [address] [place], [place] [address] [phone]   TOTAL HIP REPLACEMENT POSTOPERATIVE DIRECTIONS    Hip Rehabilitation, Guidelines Following Surgery   WEIGHT BEARING Weight bearing as tolerated with assist device (walker, cane, etc) as directed, use it as long as suggested by your surgeon or therapist, typically at least 4-6 weeks.  The results of a hip operation are greatly improved after range of motion and muscle strengthening exercises. Follow all safety measures which are given to protect your hip. If any of these exercises cause increased pain or swelling in your joint, decrease the amount until you are comfortable again. Then slowly increase the exercises. Call your caregiver if you have problems or questions.   HOME CARE INSTRUCTIONS  Most of the following instructions are designed to prevent the dislocation of your new hip.  Remove items at home which could result in a fall. This includes throw rugs or furniture in walking pathways.  Continue medications as instructed at time of discharge. You may have some home medications which will be placed on hold until you complete the course of blood thinner medication. You may start showering once you are discharged home.   Do not remove your dressing. Do not put on socks or shoes without following the instructions of your caregivers.   Sit on chairs with arms. Use the chair arms to help push yourself up when arising.  Arrange for the use of a toilet seat elevator so you are not sitting low.   Walk with walker as instructed.  You may resume a sexual relationship in one month or when given the OK by your caregiver.  Use walker as long as suggested by your caregivers.  You may put full weight on your legs and walk as much as is comfortable. Avoid periods of inactivity such as sitting longer than an hour when not asleep. This helps prevent blood clots.  You may return to work once you are cleared by your surgeon.  Do not drive a car for 6 weeks or until released by your surgeon.  Do not drive while taking narcotics.  Wear elastic stockings for two weeks following surgery during the day but you may remove then at night.  Make sure you keep all of your appointments after your operation with all of your doctors and caregivers. You should call the office at the above phone number and make an appointment for approximately two weeks after the date of your surgery. Please pick up a stool softener and laxative for home use as long as you are requiring pain medications. ICE to the affected hip every three hours for 30 minutes at a time and then as needed for pain and swelling. Continue to use ice on the hip for pain and swelling from surgery. You may notice swelling that will progress down to the foot and ankle.  This is normal after surgery.  Elevate the leg when you are not up walking on it.   It is important for you to complete the blood thinner medication as prescribed by your doctor. Continue to use the breathing machine which will help keep your temperature down.  It is common for your temperature to cycle up and down following surgery, especially at night when you are not up moving around and exerting yourself.  The breathing machine keeps your lungs expanded and your temperature down.  RANGE OF MOTION AND STRENGTHENING EXERCISES  These exercises are designed to help you keep full movement of your hip joint. Follow your caregiver's or physical therapist's instructions. Perform all exercises  about fifteen times, three times per day or as directed. Exercise both hips, even if you have had only one joint replacement. These exercises can be done on a training (exercise) mat, on the floor, on a table or on a bed. Use whatever works the best and is most comfortable for you. Use music or television while you are exercising so that the exercises are a pleasant break in your day. This will make your life better with the exercises acting as a break in routine you can look forward to.  Lying on your back, slowly slide your foot toward your buttocks, raising your knee up off the floor. Then slowly slide your foot back down until your leg is straight again.  Lying on your back spread your legs as far apart as you can without causing discomfort.  Lying on your side, raise your upper leg and foot straight up from the floor as far as is comfortable. Slowly lower the leg and repeat.  Lying on your back, tighten up the muscle in the front of your thigh (quadriceps muscles). You can do this by keeping your   leg straight and trying to raise your heel off the floor. This helps strengthen the largest muscle supporting your knee.  Lying on your back, tighten up the muscles of your buttocks both with the legs straight and with the knee bent at a comfortable angle while keeping your heel on the floor.   SKILLED REHAB INSTRUCTIONS: If the patient is transferred to a skilled rehab facility following release from the hospital, a list of the current medications will be sent to the facility for the patient to continue.  When discharged from the skilled rehab facility, please have the facility set up the patient's Home Health Physical Therapy prior to being released. Also, the skilled facility will be responsible for providing the patient with their medications at time of release from the facility to include their pain medication and their blood thinner medication. If the patient is still at the rehab facility at time of the  two week follow up appointment, the skilled rehab facility will also need to assist the patient in arranging follow up appointment in our office and any transportation needs.  POST-OPERATIVE OPIOID TAPER INSTRUCTIONS: It is important to wean off of your opioid medication as soon as possible. If you do not need pain medication after your surgery it is ok to stop day one. Opioids include: Codeine, Hydrocodone(Norco, Vicodin), Oxycodone(Percocet, oxycontin) and hydromorphone amongst others.  Long term and even short term use of opiods can cause: Increased pain response Dependence Constipation Depression Respiratory depression And more.  Withdrawal symptoms can include Flu like symptoms Nausea, vomiting And more Techniques to manage these symptoms Hydrate well Eat regular healthy meals Stay active Use relaxation techniques(deep breathing, meditating, yoga) Do Not substitute Alcohol to help with tapering If you have been on opioids for less than two weeks and do not have pain than it is ok to stop all together.  Plan to wean off of opioids This plan should start within one week post op of your joint replacement. Maintain the same interval or time between taking each dose and first decrease the dose.  Cut the total daily intake of opioids by one tablet each day Next start to increase the time between doses. The last dose that should be eliminated is the evening dose.    MAKE SURE YOU:  Understand these instructions.  Will watch your condition.  Will get help right away if you are not doing well or get worse.  Pick up stool softner and laxative for home use following surgery while on pain medications. Do not remove your dressing. The dressing is waterproof--it is OK to take showers. Continue to use ice for pain and swelling after surgery. Do not use any lotions or creams on the incision until instructed by your surgeon. Total Hip Protocol.  

## 2019-05-14 NOTE — Interval H&P Note (Signed)
History and Physical Interval Note:  05/14/2019 8:54 AM  Tanya Duncan  has presented today for surgery, with the diagnosis of Right Anterior Hip Degenerative Joint Disease.  The various methods of treatment have been discussed with the patient and family. After consideration of risks, benefits and other options for treatment, the patient has consented to  Procedure(s): Right Anterior Hip Arthroplasty (Right) as a surgical intervention.  The patient's history has been reviewed, patient examined, no change in status, stable for surgery.  I have reviewed the patient's chart and labs.  Questions were answered to the patient's satisfaction.     Hessie Dibble

## 2019-05-14 NOTE — Anesthesia Procedure Notes (Signed)
Spinal  Patient location during procedure: OR Start time: 05/14/2019 9:49 AM End time: 05/14/2019 9:52 AM Staffing Anesthesiologist: Myrtie Soman, MD Resident/CRNA: Raenette Rover, CRNA Performed: resident/CRNA  Preanesthetic Checklist Completed: patient identified, site marked, surgical consent, pre-op evaluation, timeout performed, IV checked, risks and benefits discussed and monitors and equipment checked Spinal Block Patient position: sitting Prep: DuraPrep Patient monitoring: blood pressure, continuous pulse ox and heart rate Approach: midline Location: L3-4 Injection technique: single-shot Needle Needle type: Pencan  Needle gauge: 24 G

## 2019-05-15 ENCOUNTER — Encounter (HOSPITAL_COMMUNITY): Payer: Self-pay | Admitting: Orthopaedic Surgery

## 2019-05-15 DIAGNOSIS — Z888 Allergy status to other drugs, medicaments and biological substances status: Secondary | ICD-10-CM | POA: Diagnosis not present

## 2019-05-15 DIAGNOSIS — M1990 Unspecified osteoarthritis, unspecified site: Secondary | ICD-10-CM | POA: Diagnosis present

## 2019-05-15 DIAGNOSIS — Z79899 Other long term (current) drug therapy: Secondary | ICD-10-CM | POA: Diagnosis not present

## 2019-05-15 DIAGNOSIS — Z96619 Presence of unspecified artificial shoulder joint: Secondary | ICD-10-CM | POA: Diagnosis present

## 2019-05-15 DIAGNOSIS — R32 Unspecified urinary incontinence: Secondary | ICD-10-CM | POA: Diagnosis not present

## 2019-05-15 DIAGNOSIS — F419 Anxiety disorder, unspecified: Secondary | ICD-10-CM | POA: Diagnosis present

## 2019-05-15 DIAGNOSIS — M25551 Pain in right hip: Secondary | ICD-10-CM | POA: Diagnosis present

## 2019-05-15 DIAGNOSIS — Z79891 Long term (current) use of opiate analgesic: Secondary | ICD-10-CM | POA: Diagnosis not present

## 2019-05-15 DIAGNOSIS — M1611 Unilateral primary osteoarthritis, right hip: Secondary | ICD-10-CM | POA: Diagnosis present

## 2019-05-15 DIAGNOSIS — Z96659 Presence of unspecified artificial knee joint: Secondary | ICD-10-CM | POA: Diagnosis present

## 2019-05-15 DIAGNOSIS — Z882 Allergy status to sulfonamides status: Secondary | ICD-10-CM | POA: Diagnosis not present

## 2019-05-15 DIAGNOSIS — M797 Fibromyalgia: Secondary | ICD-10-CM | POA: Diagnosis present

## 2019-05-15 MED ORDER — GABAPENTIN 300 MG PO CAPS
300.0000 mg | ORAL_CAPSULE | Freq: Four times a day (QID) | ORAL | Status: DC | PRN
  Administered 2019-05-15 – 2019-05-16 (×3): 300 mg via ORAL
  Filled 2019-05-15 (×3): qty 1

## 2019-05-15 MED ORDER — TIZANIDINE HCL 4 MG PO TABS: 4.0000 mg | ORAL_TABLET | Freq: Four times a day (QID) | ORAL | 1 refills | Status: AC | PRN

## 2019-05-15 MED ORDER — OXYCODONE-ACETAMINOPHEN 5-325 MG PO TABS: 1.0000 | ORAL_TABLET | Freq: Four times a day (QID) | ORAL | 0 refills | Status: AC | PRN

## 2019-05-15 MED ORDER — OXYCODONE-ACETAMINOPHEN 5-325 MG PO TABS
1.0000 | ORAL_TABLET | Freq: Four times a day (QID) | ORAL | Status: DC | PRN
  Administered 2019-05-15 – 2019-05-16 (×5): 2 via ORAL
  Filled 2019-05-15 (×5): qty 2

## 2019-05-15 MED ORDER — APIXABAN 2.5 MG PO TABS: 2.5000 mg | ORAL_TABLET | Freq: Two times a day (BID) | ORAL | 0 refills | Status: DC

## 2019-05-15 NOTE — Progress Notes (Signed)
Physical Therapy Treatment Patient Details Name: Tanya Duncan MRN: [id number] DOB: 1946/01/21 Today's Date: 05/15/2019    History of Present Illness 73 yo female s/p R DA-THA on 05/14/19. PMH includes anxiety, fibromyalgia, PNA, lumbar fusion, L THA, R shoulder arthroplasty.    PT Comments    Pt attempted to ambulate again this afternoon however unable to tolerate due to pain.  RN notified.  Pt does not feel ready to d/c home and needs to practice steps prior to d/c.    Follow Up Recommendations  Follow surgeon's recommendation for DC plan and follow-up therapies;Supervision for mobility/OOB     Equipment Recommendations  Rolling walker with 5" wheels;3in1 (PT)    Recommendations for Other Services       Precautions / Restrictions Precautions Precautions: Fall Restrictions Other Position/Activity Restrictions: WBAT    Mobility  Bed Mobility Overal bed mobility: Needs Assistance Bed Mobility: Supine to Sit     Supine to sit: Min guard     General bed mobility comments: pt self assisted R LE with UEs, increased time and effort  Transfers Overall transfer level: Needs assistance Equipment used: Rolling walker (2 wheeled) Transfers: Sit to/from Stand Sit to Stand: Min guard         General transfer comment: verbal cues for UE and LE positioning  Ambulation/Gait Ambulation/Gait assistance: Min guard Gait Distance (Feet): 10 Feet Assistive device: Rolling walker (2 wheeled) Gait Pattern/deviations: Step-to pattern;Decreased stance time - right;Decreased weight shift to right;Antalgic     General Gait Details: verbal cues for sequence, RW positioning, posture; pt reports 8/10 and felt unable to tolerate further ambulate so assisted to recliner   Stairs             Wheelchair Mobility    Modified Rankin (Stroke Patients Only)       Balance                                            Cognition Arousal/Alertness:  Awake/alert Behavior During Therapy: WFL for tasks assessed/performed Overall Cognitive Status: Within Functional Limits for tasks assessed                                        Exercises Total Joint Exercises Ankle Circles/Pumps: AROM;10 reps;Both Quad Sets: AROM;Both;10 reps Heel Slides: AAROM;Right;10 reps Hip ABduction/ADduction: AAROM;Right;10 reps Long Arc Quad: AROM;Right;10 reps    General Comments        Pertinent Vitals/Pain Pain Assessment: 0-10 Pain Score: 8  Pain Location: R hip Pain Descriptors / Indicators: Sore;Aching Pain Intervention(s): Monitored during session;Repositioned;Other (comment)(not yet due for pain meds, RN notified)    Home Living                      Prior Function            PT Goals (current goals can now be found in the care plan section) Progress towards PT goals: Progressing toward goals    Frequency    7X/week      PT Plan Current plan remains appropriate    Co-evaluation              AM-PAC PT "6 Clicks" Mobility   Outcome Measure  Help needed turning from your back to your side while in a  flat bed without using bedrails?: A Little Help needed moving from lying on your back to sitting on the side of a flat bed without using bedrails?: A Little Help needed moving to and from a bed to a chair (including a wheelchair)?: A Little Help needed standing up from a chair using your arms (e.g., wheelchair or bedside chair)?: A Little Help needed to walk in hospital room?: A Little Help needed climbing 3-5 steps with a railing? : A Little 6 Click Score: 18    End of Session Equipment Utilized During Treatment: Gait belt Activity Tolerance: Patient limited by pain Patient left: in chair;with call bell/phone within reach;with chair alarm set Nurse Communication: Mobility status;Patient requests pain meds PT Visit Diagnosis: Other abnormalities of gait and mobility (R26.89);Difficulty in walking,  not elsewhere classified (R26.2)     Time: 0623-7628 PT Time Calculation (min) (ACUTE ONLY): 10 min  Charges:  $Gait Training: 8-22 mins                    Zenovia Jarred, PT, DPT Acute Rehabilitation Services Office: [phone] Pager: [phone]  Tanya Duncan 05/15/2019, 2:25 PM

## 2019-05-15 NOTE — Progress Notes (Signed)
Discharge plan of care:  DME (Rolling Walker and 3 in 1) delivered by [employer]

## 2019-05-15 NOTE — Care Management CC44 (Signed)
Condition Code 44 Documentation Completed  Patient Details  Name: Tanya Duncan MRN: [id number] Date of Birth: 27-Mar-1946   Condition Code 44 given:  Yes Patient signature on Condition Code 44 notice:  Yes Documentation of 2 MD's agreement:  Yes Code 44 added to claim:  Yes    Lia Hopping, LCSW 05/15/2019, 10:27 AM

## 2019-05-15 NOTE — Progress Notes (Signed)
Subjective: 1 Day Post-Op Procedure(s) (LRB): Right Anterior Hip Arthroplasty (Right)   Patient doing well and looking forward to going home.  Activity level:  wbat Diet tolerance:  ok Voiding:  ok Patient reports pain as mild.    Objective: Vital signs in last 24 hours: Temp:  [97.5 F (36.4 C)-99.3 F (37.4 C)] 99.3 F (37.4 C) (07/08 0607) Pulse Rate:  [77-108] 101 (07/08 0607) Resp:  [8-18] 16 (07/08 0607) BP: (98-147)/(58-99) 115/61 (07/08 0607) SpO2:  [90 %-100 %] 95 % (07/08 0607) Weight:  [67.5 kg] 67.5 kg (07/07 0802)  Labs: No results for input(s): HGB in the last 72 hours. No results for input(s): WBC, RBC, HCT, PLT in the last 72 hours. No results for input(s): NA, K, CL, CO2, BUN, CREATININE, GLUCOSE, CALCIUM in the last 72 hours. No results for input(s): LABPT, INR in the last 72 hours.  Physical Exam:  Neurologically intact ABD soft Neurovascular intact Sensation intact distally Intact pulses distally Dorsiflexion/Plantar flexion intact Incision: dressing C/D/I and no drainage No cellulitis present Compartment soft  Assessment/Plan:  1 Day Post-Op Procedure(s) (LRB): Right Anterior Hip Arthroplasty (Right) Advance diet Up with therapy Discharge home with home health today after PT Continue on eliquis 2.5mg  BID for DVT prevention Follow up in office 2 weeks post op.   Tanya Duncan Tanya Duncan 05/15/2019, 7:45 AM

## 2019-05-15 NOTE — Progress Notes (Signed)
Physical Therapy Treatment Patient Details Name: Tanya Duncan MRN: [id number] DOB: 04/13/1946 Today's Date: 05/15/2019    History of Present Illness 73 yo female s/p R DA-THA on 05/14/19. PMH includes anxiety, fibromyalgia, PNA, lumbar fusion, L THA, R shoulder arthroplasty.    PT Comments    Pt ambulated in hallway and distance limited by pain.  Pt performed exercises once settled in recliner.  Will return to attempt stairs this afternoon.  Follow Up Recommendations  Follow surgeon's recommendation for DC plan and follow-up therapies;Supervision for mobility/OOB     Equipment Recommendations  Rolling walker with 5" wheels;3in1 (PT)    Recommendations for Other Services       Precautions / Restrictions Precautions Precautions: Fall Restrictions Other Position/Activity Restrictions: WBAT    Mobility  Bed Mobility Overal bed mobility: Needs Assistance Bed Mobility: Supine to Sit     Supine to sit: Min guard;HOB elevated     General bed mobility comments: Verbal cuing for sequencing, increased time and effort.  Transfers Overall transfer level: Needs assistance Equipment used: Rolling walker (2 wheeled) Transfers: Sit to/from Stand Sit to Stand: Min guard         General transfer comment: verbal cues for UE and LE positioning  Ambulation/Gait Ambulation/Gait assistance: Min guard Gait Distance (Feet): 50 Feet Assistive device: Rolling walker (2 wheeled) Gait Pattern/deviations: Step-to pattern;Antalgic;Decreased stance time - right     General Gait Details: verbal cues for sequence, RW positioning, posture; distance limited by pain   Stairs             Wheelchair Mobility    Modified Rankin (Stroke Patients Only)       Balance                                            Cognition Arousal/Alertness: Awake/alert Behavior During Therapy: WFL for tasks assessed/performed Overall Cognitive Status: Within Functional Limits for  tasks assessed                                        Exercises Total Joint Exercises Ankle Circles/Pumps: AROM;10 reps;Both Quad Sets: AROM;Both;10 reps Heel Slides: AAROM;Right;10 reps Hip ABduction/ADduction: AAROM;Right;10 reps Long Arc Quad: AROM;Right;10 reps    General Comments        Pertinent Vitals/Pain Pain Assessment: 0-10 Pain Score: 8  Pain Location: R hip Pain Descriptors / Indicators: Sore;Aching Pain Intervention(s): Limited activity within patient's tolerance;Monitored during session;Repositioned    Home Living                      Prior Function            PT Goals (current goals can now be found in the care plan section) Progress towards PT goals: Progressing toward goals    Frequency    7X/week      PT Plan Current plan remains appropriate    Co-evaluation              AM-PAC PT "6 Clicks" Mobility   Outcome Measure  Help needed turning from your back to your side while in a flat bed without using bedrails?: A Little Help needed moving from lying on your back to sitting on the side of a flat bed without using bedrails?: A Little Help needed  moving to and from a bed to a chair (including a wheelchair)?: A Little Help needed standing up from a chair using your arms (e.g., wheelchair or bedside chair)?: A Little Help needed to walk in hospital room?: A Little Help needed climbing 3-5 steps with a railing? : A Little 6 Click Score: 18    End of Session Equipment Utilized During Treatment: Gait belt Activity Tolerance: Patient limited by pain Patient left: in chair;with call bell/phone within reach;with chair alarm set Nurse Communication: Patient requests pain meds;Mobility status PT Visit Diagnosis: Other abnormalities of gait and mobility (R26.89);Difficulty in walking, not elsewhere classified (R26.2)     Time: 2248-2500 PT Time Calculation (min) (ACUTE ONLY): 25 min  Charges:  $Gait Training: 8-22  mins $Therapeutic Exercise: 8-22 mins           Carmelia Bake, PT, DPT Acute Rehabilitation Services Office: [phone] Pager: [phone]    Trena Platt 05/15/2019, 12:46 PM

## 2019-05-15 NOTE — Care Management Obs Status (Signed)
[facility] NOTIFICATION   Patient Details  Name: Tanya Duncan MRN: [id number] Date of Birth: November 04, 1946   Medicare Observation Status Notification Given:  Yes    Lia Hopping, LCSW 05/15/2019, 10:27 AM

## 2019-05-15 NOTE — Discharge Summary (Addendum)
Patient ID: Tanya Duncan MRN: [id number] DOB/AGE: 1946-09-15 73 y.o.  Admit date: 05/14/2019 Discharge date: 05/16/2019  Admission Diagnoses:  Principal Problem:   Primary osteoarthritis of right hip   Discharge Diagnoses:  Same  Past Medical History:  Diagnosis Date  . Anxiety   . Arthritis   . Fibromyalgia   . Pneumonia 2005  . Stomach ulcer 1990    Surgeries: Procedure(s): Right Anterior Hip Arthroplasty on 05/14/2019   Consultants:   Discharged Condition: Improved  Hospital Course: Tanya Duncan is an 73 y.o. female who was admitted 05/14/2019 for operative treatment ofPrimary osteoarthritis of right hip. Patient has severe unremitting pain that affects sleep, daily activities, and work/hobbies. After pre-op clearance the patient was taken to the operating room on 05/14/2019 and underwent  Procedure(s): Right Anterior Hip Arthroplasty.    Patient was given perioperative antibiotics:  Anti-infectives (From admission, onward)   Start     Dose/Rate Route Frequency Ordered Stop   05/14/19 1600  ceFAZolin (ANCEF) IVPB 2g/100 mL premix     2 g 200 mL/hr over 30 Minutes Intravenous Every 6 hours 05/14/19 1327 05/14/19 2200   05/14/19 0800  ceFAZolin (ANCEF) IVPB 2g/100 mL premix     2 g 200 mL/hr over 30 Minutes Intravenous On call to O.R. 05/14/19 6767 05/14/19 2094       Patient was given sequential compression devices, early ambulation, and chemoprophylaxis to prevent DVT.  Patient benefited maximally from hospital stay and there were no complications.    Recent vital signs:  Patient Vitals for the past 24 hrs:  BP Temp Temp src Pulse Resp SpO2 Height Weight  05/15/19 0607 115/61 99.3 F (37.4 C) Oral (!) 101 16 95 % - -  05/15/19 0156 102/68 98.2 F (36.8 C) Oral (!) 108 18 99 % - -  05/14/19 2230 - - - - - 96 % - -  05/14/19 2131 (!) 107/58 98 F (36.7 C) Oral 87 14 90 % - -  05/14/19 1656 100/73 97.8 F (36.6 C) - 99 16 100 % - -  05/14/19 1620 126/70 (!) 97.5  F (36.4 C) - 96 16 97 % - -  05/14/19 1527 98/68 98 F (36.7 C) Oral 85 15 100 % - -  05/14/19 1417 138/74 (!) 97.5 F (36.4 C) - 78 16 100 % - -  05/14/19 1321 103/79 (!) 97.5 F (36.4 C) Oral 78 14 92 % - -  05/14/19 1300 125/74 - - - (!) 8 95 % - -  05/14/19 1245 115/67 - - - (!) 8 94 % - -  05/14/19 1230 101/67 - - 77 16 100 % - -  05/14/19 1215 105/64 - - 81 16 100 % - -  05/14/19 1200 104/65 (!) 97.5 F (36.4 C) - 82 12 100 % - -  05/14/19 0802 (!) 147/99 98.3 F (36.8 C) Oral 98 18 98 % 5\' 3"  (1.6 m) 67.5 kg     Recent laboratory studies: No results for input(s): WBC, HGB, HCT, PLT, NA, K, CL, CO2, BUN, CREATININE, GLUCOSE, INR, CALCIUM in the last 72 hours.  Invalid input(s): PT, 2   Discharge Medications:   Allergies as of 05/15/2019      Reactions   Indocin [indomethacin]    Dizziness   Sage [salvia Officinalis]    Heart races    Sulfa Antibiotics Nausea And Vomiting   Avelox [moxifloxacin Hcl In Nacl] Palpitations      Medication List    STOP taking  these medications   HYDROcodone-acetaminophen 5-325 MG tablet Commonly known as: NORCO/VICODIN   METHOCARBAMOL PO   traMADol 50 MG tablet Commonly known as: ULTRAM     TAKE these medications   apixaban 2.5 MG Tabs tablet Commonly known as: ELIQUIS Take 1 tablet (2.5 mg total) by mouth every 12 (twelve) hours.   b complex vitamins tablet Take 1 tablet by mouth daily.   diphenoxylate-atropine 2.5-0.025 MG tablet Commonly known as: Lomotil Take 2 tablets by mouth 4 (four) times daily as needed for diarrhea or loose stools.   escitalopram 20 MG tablet Commonly known as: LEXAPRO Take 20 mg by mouth daily.   gabapentin 300 MG capsule Commonly known as: NEURONTIN Take 300 mg by mouth 4 (four) times daily as needed (pain).   LORazepam 0.5 MG tablet Commonly known as: ATIVAN Take 0.5 mg by mouth 2 (two) times daily as needed for anxiety.   Melatonin 5 MG Tabs Take 5 mg by mouth at bedtime.    multivitamin with minerals Tabs tablet Take 1 tablet by mouth daily.   nortriptyline 25 MG capsule Commonly known as: PAMELOR Take 50 mg by mouth at bedtime.   omeprazole 40 MG capsule Commonly known as: PRILOSEC TAKE 1 CAPSULE BY MOUTH DAILY. What changed: when to take this   oxyCODONE-acetaminophen 5-325 MG tablet Commonly known as: Percocet Take 1-2 tablets by mouth every 6 (six) hours as needed for severe pain.   psyllium 0.52 g capsule Commonly known as: REGULOID Take 0.52 g by mouth daily.   tiZANidine 4 MG tablet Commonly known as: Zanaflex Take 1 tablet (4 mg total) by mouth every 6 (six) hours as needed.   vitamin C 1000 MG tablet Take 2,000 mg by mouth daily.   zolpidem 10 MG tablet Commonly known as: AMBIEN Take 10 mg by mouth at bedtime as needed for sleep.            Durable Medical Equipment  (From admission, onward)         Start     Ordered   05/14/19 1327  DME Walker rolling  Once    Question:  Patient needs a walker to treat with the following condition  Answer:  Primary osteoarthritis of right hip   05/14/19 1327   05/14/19 1327  DME 3 n 1  Once     05/14/19 1327   05/14/19 1327  DME Bedside commode  Once    Question:  Patient needs a bedside commode to treat with the following condition  Answer:  Primary osteoarthritis of right hip   05/14/19 1327          Diagnostic Studies: Dg Chest 2 View  Result Date: 05/07/2019 CLINICAL DATA:  Preoperative study prior to hip replacement. EXAM: CHEST - 2 VIEW COMPARISON:  None. FINDINGS: Probable healed right rib fractures. The heart, hila, mediastinum, lungs, and pleura are otherwise unremarkable. IMPRESSION: No acute abnormalities are noted. Electronically Signed   By: Gerome Samavid  Williams III M.D   On: 05/07/2019 16:17   Dg C-arm 1-60 Min-no Report  Result Date: 05/14/2019 Fluoroscopy was utilized by the requesting physician.  No radiographic interpretation.   Dg Hip Operative Unilat W Or W/o  Pelvis Right  Result Date: 05/14/2019 CLINICAL DATA:  Right hip replacement. FLUOROSCOPY TIME:  39 seconds EXAM: OPERATIVE RIGHT HIP (WITH PELVIS IF PERFORMED) 2 VIEWS TECHNIQUE: Fluoroscopic spot image(s) were submitted for interpretation post-operatively. COMPARISON:  None. FINDINGS: Patient is status post right hip replacement. Acetabular and femoral components are  in good position. IMPRESSION: Right hip replacement as above. Electronically Signed   By: Gerome Sam III M.D   On: 05/14/2019 11:47    Disposition: Discharge disposition: 01-Home or Self Care       Discharge Instructions    Call MD / Call 911   Complete by: As directed    If you experience chest pain or shortness of breath, CALL 911 and be transported to the hospital emergency room.  If you develope a fever above 101 F, pus (white drainage) or increased drainage or redness at the wound, or calf pain, call your surgeon's office.   Constipation Prevention   Complete by: As directed    Drink plenty of fluids.  Prune juice may be helpful.  You may use a stool softener, such as Colace (over the counter) 100 mg twice a day.  Use MiraLax (over the counter) for constipation as needed.   Diet - low sodium heart healthy   Complete by: As directed    Discharge instructions   Complete by: As directed    INSTRUCTIONS AFTER JOINT REPLACEMENT   Remove items at home which could result in a fall. This includes throw rugs or furniture in walking pathways ICE to the affected joint every three hours while awake for 30 minutes at a time, for at least the first 3-5 days, and then as needed for pain and swelling.  Continue to use ice for pain and swelling. You may notice swelling that will progress down to the foot and ankle.  This is normal after surgery.  Elevate your leg when you are not up walking on it.   Continue to use the breathing machine you got in the hospital (incentive spirometer) which will help keep your temperature down.  It is  common for your temperature to cycle up and down following surgery, especially at night when you are not up moving around and exerting yourself.  The breathing machine keeps your lungs expanded and your temperature down.   DIET:  As you were doing prior to hospitalization, we recommend a well-balanced diet.  DRESSING / WOUND CARE / SHOWERING  You may shower 3 days after surgery, but keep the wounds dry during showering.  You may use an occlusive plastic wrap (Press'n Seal for example), NO SOAKING/SUBMERGING IN THE BATHTUB.  If the bandage gets wet, change with a clean dry gauze.  If the incision gets wet, pat the wound dry with a clean towel.  ACTIVITY  Increase activity slowly as tolerated, but follow the weight bearing instructions below.   No driving for 6 weeks or until further direction given by your physician.  You cannot drive while taking narcotics.  No lifting or carrying greater than 10 lbs. until further directed by your surgeon. Avoid periods of inactivity such as sitting longer than an hour when not asleep. This helps prevent blood clots.  You may return to work once you are authorized by your doctor.     WEIGHT BEARING   Weight bearing as tolerated with assist device (walker, cane, etc) as directed, use it as long as suggested by your surgeon or therapist, typically at least 4-6 weeks.   EXERCISES  Results after joint replacement surgery are often greatly improved when you follow the exercise, range of motion and muscle strengthening exercises prescribed by your doctor. Safety measures are also important to protect the joint from further injury. Any time any of these exercises cause you to have increased pain or swelling, decrease what  you are doing until you are comfortable again and then slowly increase them. If you have problems or questions, call your caregiver or physical therapist for advice.   Rehabilitation is important following a joint replacement. After just a few  days of immobilization, the muscles of the leg can become weakened and shrink (atrophy).  These exercises are designed to build up the tone and strength of the thigh and leg muscles and to improve motion. Often times heat used for twenty to thirty minutes before working out will loosen up your tissues and help with improving the range of motion but do not use heat for the first two weeks following surgery (sometimes heat can increase post-operative swelling).   These exercises can be done on a training (exercise) mat, on the floor, on a table or on a bed. Use whatever works the best and is most comfortable for you.    Use music or television while you are exercising so that the exercises are a pleasant break in your day. This will make your life better with the exercises acting as a break in your routine that you can look forward to.   Perform all exercises about fifteen times, three times per day or as directed.  You should exercise both the operative leg and the other leg as well.   Exercises include:   Quad Sets - Tighten up the muscle on the front of the thigh (Quad) and hold for 5-10 seconds.   Straight Leg Raises - With your knee straight (if you were given a brace, keep it on), lift the leg to 60 degrees, hold for 3 seconds, and slowly lower the leg.  Perform this exercise against resistance later as your leg gets stronger.  Leg Slides: Lying on your back, slowly slide your foot toward your buttocks, bending your knee up off the floor (only go as far as is comfortable). Then slowly slide your foot back down until your leg is flat on the floor again.  Angel Wings: Lying on your back spread your legs to the side as far apart as you can without causing discomfort.  Hamstring Strength:  Lying on your back, push your heel against the floor with your leg straight by tightening up the muscles of your buttocks.  Repeat, but this time bend your knee to a comfortable angle, and push your heel against the  floor.  You may put a pillow under the heel to make it more comfortable if necessary.   A rehabilitation program following joint replacement surgery can speed recovery and prevent re-injury in the future due to weakened muscles. Contact your doctor or a physical therapist for more information on knee rehabilitation.    CONSTIPATION  Constipation is defined medically as fewer than three stools per week and severe constipation as less than one stool per week.  Even if you have a regular bowel pattern at home, your normal regimen is likely to be disrupted due to multiple reasons following surgery.  Combination of anesthesia, postoperative narcotics, change in appetite and fluid intake all can affect your bowels.   YOU MUST use at least one of the following options; they are listed in order of increasing strength to get the job done.  They are all available over the counter, and you may need to use some, POSSIBLY even all of these options:    Drink plenty of fluids (prune juice may be helpful) and high fiber foods Colace 100 mg by mouth twice a day  Senokot  for constipation as directed and as needed Dulcolax (bisacodyl), take with full glass of water  Miralax (polyethylene glycol) once or twice a day as needed.  If you have tried all these things and are unable to have a bowel movement in the first 3-4 days after surgery call either your surgeon or your primary doctor.    If you experience loose stools or diarrhea, hold the medications until you stool forms back up.  If your symptoms do not get better within 1 week or if they get worse, check with your doctor.  If you experience "the worst abdominal pain ever" or develop nausea or vomiting, please contact the office immediately for further recommendations for treatment.   ITCHING:  If you experience itching with your medications, try taking only a single pain pill, or even half a pain pill at a time.  You can also use Benadryl over the counter for  itching or also to help with sleep.   TED HOSE STOCKINGS:  Use stockings on both legs until for at least 2 weeks or as directed by physician office. They may be removed at night for sleeping.  MEDICATIONS:  See your medication summary on the "After Visit Summary" that nursing will review with you.  You may have some home medications which will be placed on hold until you complete the course of blood thinner medication.  It is important for you to complete the blood thinner medication as prescribed.  PRECAUTIONS:  If you experience chest pain or shortness of breath - call 911 immediately for transfer to the hospital emergency department.   If you develop a fever greater that 101 F, purulent drainage from wound, increased redness or drainage from wound, foul odor from the wound/dressing, or calf pain - CONTACT YOUR SURGEON.                                                   FOLLOW-UP APPOINTMENTS:  If you do not already have a post-op appointment, please call the office for an appointment to be seen by your surgeon.  Guidelines for how soon to be seen are listed in your "After Visit Summary", but are typically between 1-4 weeks after surgery.  OTHER INSTRUCTIONS:   Knee Replacement:  Do not place pillow under knee, focus on keeping the knee straight while resting. CPM instructions: 0-90 degrees, 2 hours in the morning, 2 hours in the afternoon, and 2 hours in the evening. Place foam block, curve side up under heel at all times except when in CPM or when walking.  DO NOT modify, tear, cut, or change the foam block in any way.  MAKE SURE YOU:  Understand these instructions.  Get help right away if you are not doing well or get worse.    Thank you for letting us be a part of your medical care team.  It is a privilege we respect greatly.  We hope these instructions will help you stay on track for a fast and full recovery!   Increase activity slowly as tolerated   Complete by: As directed        Follow-up Information    Marcene Corning, MD. Go on 05/24/2019.   Specialty: Orthopedic Surgery Why: You appointment is scheduled for 9:30  Contact information: [address] [place] [address] [phone]  [employer] Go on 05/17/2019.   Why: You are scheduled to start Outpatient physical therapy at 10:40. Please arrive at 10:20 to complete your paperwork  Contact information: [facility] [place] [phone]            Signed: Larwance Sachs Janaysia Mcleroy 05/15/2019, 7:50 AM

## 2019-05-16 MED ORDER — KETOROLAC TROMETHAMINE 15 MG/ML IJ SOLN
7.5000 mg | Freq: Three times a day (TID) | INTRAMUSCULAR | Status: DC | PRN
  Administered 2019-05-16 (×2): 7.5 mg via INTRAVENOUS
  Filled 2019-05-16 (×2): qty 1

## 2019-05-16 NOTE — Care Plan (Signed)
Ortho Bundle Case Management Note  Patient Details  Name: Tanya Duncan MRN: [id number] Date of Birth: Jan 25, 1946   I have spoke with patient and PA. We are going to switch to HHPT - referral to [facility] to 05/24/19.  Slow to progress with PT due to pain issues. Patient is aware of this and agreeable. Choice offered.                   DME Arranged:  Bedside commode, Walker rolling DME Agency:  [employer]  HH Arranged:  PT [place] Agency:  [facility] ([facility]  Additional Comments: Please contact me with any questions of if this plan should need to change.  Ladell Heads,  [place] Specialist  [phone] 05/16/2019, 10:40 AM

## 2019-05-16 NOTE — Progress Notes (Signed)
Physical Therapy Treatment Patient Details Name: Tanya Duncan MRN: [id number] DOB: 31-Aug-1946 Today's Date: 05/16/2019    History of Present Illness 73 yo female s/p R DA-THA on 05/14/19. PMH includes anxiety, fibromyalgia, PNA, lumbar fusion, L THA, R shoulder arthroplasty.    PT Comments    Pt reports pain improved today and tolerated ambulating in hallway well.  Pt able to practice safe stair technique and then requested back to bed end of session.  Pt wished to perform LE exercises next session.  Follow Up Recommendations  Follow surgeon's recommendation for DC plan and follow-up therapies;Supervision for mobility/OOB     Equipment Recommendations  Rolling walker with 5" wheels;3in1 (PT)    Recommendations for Other Services       Precautions / Restrictions Precautions Precautions: Fall Restrictions Other Position/Activity Restrictions: WBAT    Mobility  Bed Mobility Overal bed mobility: Needs Assistance Bed Mobility: Supine to Sit;Sit to Supine     Supine to sit: Min guard;HOB elevated Sit to supine: Min guard;HOB elevated   General bed mobility comments: Verbal cuing for sequencing, increased time and effort.  Transfers Overall transfer level: Needs assistance Equipment used: Rolling walker (2 wheeled) Transfers: Sit to/from Stand Sit to Stand: Min guard         General transfer comment: verbal cues for UE and LE positioning  Ambulation/Gait Ambulation/Gait assistance: Min guard Gait Distance (Feet): 120 Feet Assistive device: Rolling walker (2 wheeled) Gait Pattern/deviations: Step-to pattern;Antalgic;Decreased stance time - right Gait velocity: decr   General Gait Details: verbal cues for sequence, RW positioning, posture   Stairs Stairs: Yes Stairs assistance: Min guard Stair Management: Step to pattern;Forwards;One rail Left Number of Stairs: 2 General stair comments: verbal cues for sequence and safety; pt only wished to perform  once   Wheelchair Mobility    Modified Rankin (Stroke Patients Only)       Balance                                            Cognition Arousal/Alertness: Awake/alert Behavior During Therapy: WFL for tasks assessed/performed Overall Cognitive Status: Within Functional Limits for tasks assessed                                        Exercises      General Comments        Pertinent Vitals/Pain Pain Assessment: 0-10 Pain Score: 4  Pain Location: R hip Pain Descriptors / Indicators: Sore;Aching Pain Intervention(s): Premedicated before session;Repositioned;Monitored during session    Home Living                      Prior Function            PT Goals (current goals can now be found in the care plan section) Progress towards PT goals: Progressing toward goals    Frequency    7X/week      PT Plan Current plan remains appropriate    Co-evaluation              AM-PAC PT "6 Clicks" Mobility   Outcome Measure  Help needed turning from your back to your side while in a flat bed without using bedrails?: A Little Help needed moving from lying on your back to sitting on  the side of a flat bed without using bedrails?: A Little Help needed moving to and from a bed to a chair (including a wheelchair)?: A Little Help needed standing up from a chair using your arms (e.g., wheelchair or bedside chair)?: A Little Help needed to walk in hospital room?: A Little Help needed climbing 3-5 steps with a railing? : A Little 6 Click Score: 18    End of Session Equipment Utilized During Treatment: Gait belt Activity Tolerance: Patient limited by pain Patient left: with call bell/phone within reach;in bed   PT Visit Diagnosis: Other abnormalities of gait and mobility (R26.89);Difficulty in walking, not elsewhere classified (R26.2)     Time: 8811-0315 PT Time Calculation (min) (ACUTE ONLY): 13 min  Charges:  $Gait  Training: 8-22 mins                    Carmelia Bake, PT, DPT Acute Rehabilitation Services Office: [phone] Pager: [phone]  Trena Platt 05/16/2019, 3:03 PM

## 2019-05-16 NOTE — Plan of Care (Signed)
  Problem: Education: Goal: Knowledge of the prescribed therapeutic regimen will improve Outcome: Progressing Goal: Understanding of discharge needs will improve Outcome: Progressing   Problem: Activity: Goal: Ability to avoid complications of mobility impairment will improve Outcome: Progressing Goal: Ability to tolerate increased activity will improve Outcome: Progressing   Problem: Pain Management: Goal: Pain level will decrease with appropriate interventions Outcome: Progressing   

## 2019-05-16 NOTE — Progress Notes (Addendum)
Physical Therapy Treatment Patient Details Name: Tanya [facility] MRN: [id number] DOB: [birth year] Today's Date: 05/16/2019    History of Present Illness 73 yo female s/p R DA-THA on 05/14/19. PMH includes anxiety, fibromyalgia, PNA, lumbar fusion, L THA, R shoulder arthroplasty.    PT Comments    Pt ambulated in hallway and performed LE exerises.  Pt had HEP handout provided yesterday and aware to wait for next therapist to assist with standing exercises.  Pt now to have HHPT upon d/c.  Pt to d/c home later today.   Follow Up Recommendations  Follow surgeon's recommendation for DC plan and follow-up therapies;Supervision for mobility/OOB     Equipment Recommendations  Rolling walker with 5" wheels;3in1 (PT)    Recommendations for Other Services       Precautions / Restrictions Precautions Precautions: Fall Restrictions Other Position/Activity Restrictions: WBAT    Mobility  Bed Mobility Overal bed mobility: Needs Assistance Bed Mobility: Supine to Sit;Sit to Supine     Supine to sit: Min guard Sit to supine: Min guard   General bed mobility comments: increased time and effort; pt self assisted R LE  Transfers Overall transfer level: Needs assistance Equipment used: Rolling walker (2 wheeled) Transfers: Sit to/from Stand Sit to Stand: Min guard         General transfer comment: verbal cues for UE and LE positioning  Ambulation/Gait Ambulation/Gait assistance: Min guard Gait Distance (Feet): 120 Feet Assistive device: Rolling walker (2 wheeled) Gait Pattern/deviations: Step-to pattern;Antalgic;Decreased stance time - right Gait velocity: decr   General Gait Details: verbal cues for sequence, RW positioning, posture   Stairs   Wheelchair Mobility    Modified Rankin (Stroke Patients Only)       Balance                                            Cognition Arousal/Alertness: Awake/alert Behavior During Therapy: WFL for tasks  assessed/performed Overall Cognitive Status: Within Functional Limits for tasks assessed                                        Exercises Total Joint Exercises Ankle Circles/Pumps: AROM;10 reps;Both Quad Sets: AROM;Both;10 reps Heel Slides: AAROM;Right;10 reps Hip ABduction/ADduction: AAROM;Right;10 reps Long Arc Quad: AROM;Right;10 reps;Seated    General Comments        Pertinent Vitals/Pain Pain Assessment: 0-10 Pain Score: 5  Pain Location: R hip Pain Descriptors / Indicators: Sore;Aching Pain Intervention(s): Monitored during session;Repositioned;Premedicated before session;Ice applied    Home Living                      Prior Function            PT Goals (current goals can now be found in the care plan section) Progress towards PT goals: Progressing toward goals    Frequency    7X/week      PT Plan Current plan remains appropriate    Co-evaluation              AM-PAC PT "6 Clicks" Mobility   Outcome Measure  Help needed turning from your back to your side while in a flat bed without using bedrails?: A Little Help needed moving from lying on your back to sitting on the side of a  flat bed without using bedrails?: A Little Help needed moving to and from a bed to a chair (including a wheelchair)?: A Little Help needed standing up from a chair using your arms (e.g., wheelchair or bedside chair)?: A Little Help needed to walk in hospital room?: A Little Help needed climbing 3-5 steps with a railing? : A Little 6 Click Score: 18    End of Session Equipment Utilized During Treatment: Gait belt Activity Tolerance: Patient tolerated treatment well Patient left: with call bell/phone within reach;in bed Nurse Communication: Mobility status PT Visit Diagnosis: Other abnormalities of gait and mobility (R26.89);Difficulty in walking, not elsewhere classified (R26.2)     Time: 7062-3762 PT Time Calculation (min) (ACUTE ONLY): 31  min  Charges:  $Gait Training: 8-22 mins $Therapeutic Exercise: 8-22 mins                     Zenovia Jarred, PT, DPT Acute Rehabilitation Services Office: [phone] Pager: [phone]  Sarajane Jews 05/16/2019, 4:12 PM

## 2019-05-16 NOTE — Progress Notes (Signed)
Subjective: 2 Days Post-Op Procedure(s) (LRB): Right Anterior Hip Arthroplasty (Right)   Patient still having an increase of pain. She also has urinary incontinence but states it is because she can not get up to the bathroom fast enough.   Activity level:  wbat Diet tolerance:  ok Voiding:  ok Patient reports pain as mild.    Objective: Vital signs in last 24 hours: Temp:  [98.2 F (36.8 C)-100.4 F (38 C)] 98.9 F (37.2 C) (07/09 0530) Pulse Rate:  [99-119] 109 (07/09 0530) Resp:  [16-18] 18 (07/09 0530) BP: (101-131)/(58-72) 123/70 (07/09 0530) SpO2:  [79 %-97 %] 94 % (07/09 0530)  Labs: No results for input(s): HGB in the last 72 hours. No results for input(s): WBC, RBC, HCT, PLT in the last 72 hours. No results for input(s): NA, K, CL, CO2, BUN, CREATININE, GLUCOSE, CALCIUM in the last 72 hours. No results for input(s): LABPT, INR in the last 72 hours.  Physical Exam:  Neurologically intact ABD soft Neurovascular intact Sensation intact distally Intact pulses distally Dorsiflexion/Plantar flexion intact Incision: dressing C/D/I and no drainage No cellulitis present Compartment soft  Assessment/Plan:  2 Days Post-Op Procedure(s) (LRB): Right Anterior Hip Arthroplasty (Right) Advance diet Up with therapy  I will add a few doses of IV toradol to help with pain. I will touch base back at lunch time to see how she is doing. If she is doing well then we will get her home today. If her pain is still not well controlled then we will have her stay one more night. We will continue to follow her closely.   Larwance Sachs Kylor Valverde 05/16/2019, 6:46 AM

## 2019-05-17 ENCOUNTER — Other Ambulatory Visit: Payer: Self-pay

## 2019-05-17 ENCOUNTER — Emergency Department (HOSPITAL_COMMUNITY): Payer: Medicare Other

## 2019-05-17 ENCOUNTER — Observation Stay (HOSPITAL_COMMUNITY)
Admission: EM | Admit: 2019-05-17 | Discharge: 2019-05-18 | Disposition: A | Discharge status: Home / Self Care | Payer: Medicare Other | Attending: Internal Medicine | Admitting: Internal Medicine

## 2019-05-17 DIAGNOSIS — E861 Hypovolemia: Secondary | ICD-10-CM

## 2019-05-17 DIAGNOSIS — Z79899 Other long term (current) drug therapy: Secondary | ICD-10-CM | POA: Diagnosis not present

## 2019-05-17 DIAGNOSIS — R55 Syncope and collapse: Secondary | ICD-10-CM | POA: Insufficient documentation

## 2019-05-17 DIAGNOSIS — R4182 Altered mental status, unspecified: Secondary | ICD-10-CM | POA: Diagnosis not present

## 2019-05-17 DIAGNOSIS — R2681 Unsteadiness on feet: Secondary | ICD-10-CM | POA: Diagnosis not present

## 2019-05-17 DIAGNOSIS — Z96611 Presence of right artificial shoulder joint: Secondary | ICD-10-CM | POA: Insufficient documentation

## 2019-05-17 DIAGNOSIS — K5730 Diverticulosis of large intestine without perforation or abscess without bleeding: Secondary | ICD-10-CM | POA: Diagnosis not present

## 2019-05-17 DIAGNOSIS — D500 Iron deficiency anemia secondary to blood loss (chronic): Secondary | ICD-10-CM | POA: Insufficient documentation

## 2019-05-17 DIAGNOSIS — Z881 Allergy status to other antibiotic agents status: Secondary | ICD-10-CM | POA: Diagnosis not present

## 2019-05-17 DIAGNOSIS — Z882 Allergy status to sulfonamides status: Secondary | ICD-10-CM | POA: Diagnosis not present

## 2019-05-17 DIAGNOSIS — D649 Anemia, unspecified: Secondary | ICD-10-CM | POA: Diagnosis present

## 2019-05-17 DIAGNOSIS — I9589 Other hypotension: Secondary | ICD-10-CM

## 2019-05-17 DIAGNOSIS — R52 Pain, unspecified: Secondary | ICD-10-CM

## 2019-05-17 DIAGNOSIS — M6281 Muscle weakness (generalized): Secondary | ICD-10-CM | POA: Diagnosis not present

## 2019-05-17 DIAGNOSIS — Z8711 Personal history of peptic ulcer disease: Secondary | ICD-10-CM | POA: Insufficient documentation

## 2019-05-17 DIAGNOSIS — K219 Gastro-esophageal reflux disease without esophagitis: Secondary | ICD-10-CM | POA: Insufficient documentation

## 2019-05-17 DIAGNOSIS — Z1159 Encounter for screening for other viral diseases: Secondary | ICD-10-CM | POA: Insufficient documentation

## 2019-05-17 DIAGNOSIS — F419 Anxiety disorder, unspecified: Secondary | ICD-10-CM | POA: Diagnosis present

## 2019-05-17 DIAGNOSIS — M797 Fibromyalgia: Secondary | ICD-10-CM | POA: Insufficient documentation

## 2019-05-17 DIAGNOSIS — Z96641 Presence of right artificial hip joint: Secondary | ICD-10-CM | POA: Insufficient documentation

## 2019-05-17 DIAGNOSIS — K648 Other hemorrhoids: Secondary | ICD-10-CM | POA: Diagnosis not present

## 2019-05-17 DIAGNOSIS — Z7901 Long term (current) use of anticoagulants: Secondary | ICD-10-CM | POA: Insufficient documentation

## 2019-05-17 DIAGNOSIS — G629 Polyneuropathy, unspecified: Secondary | ICD-10-CM | POA: Insufficient documentation

## 2019-05-17 DIAGNOSIS — M1611 Unilateral primary osteoarthritis, right hip: Secondary | ICD-10-CM | POA: Diagnosis not present

## 2019-05-17 DIAGNOSIS — I959 Hypotension, unspecified: Principal | ICD-10-CM | POA: Insufficient documentation

## 2019-05-17 LAB — CBC WITH DIFFERENTIAL/PLATELET
Abs Immature Granulocytes: 0.05 10*3/uL (ref 0.00–0.07)
Basophils Absolute: 0 10*3/uL (ref 0.0–0.1)
Basophils Relative: 0 %
Eosinophils Absolute: 0.6 10*3/uL — ABNORMAL HIGH (ref 0.0–0.5)
Eosinophils Relative: 6 %
HCT: 24.6 % — ABNORMAL LOW (ref 36.0–46.0)
Hemoglobin: 7.7 g/dL — ABNORMAL LOW (ref 12.0–15.0)
Immature Granulocytes: 1 %
Lymphocytes Relative: 20 %
Lymphs Abs: 1.8 10*3/uL (ref 0.7–4.0)
MCH: 28.7 pg (ref 26.0–34.0)
MCHC: 31.3 g/dL (ref 30.0–36.0)
MCV: 91.8 fL (ref 80.0–100.0)
Monocytes Absolute: 0.7 10*3/uL (ref 0.1–1.0)
Monocytes Relative: 9 %
Neutro Abs: 5.5 10*3/uL (ref 1.7–7.7)
Neutrophils Relative %: 64 %
Platelets: 211 10*3/uL (ref 150–400)
RBC: 2.68 MIL/uL — ABNORMAL LOW (ref 3.87–5.11)
RDW: 13.1 % (ref 11.5–15.5)
WBC: 8.6 10*3/uL (ref 4.0–10.5)
nRBC: 0 % (ref 0.0–0.2)

## 2019-05-17 LAB — LACTIC ACID, PLASMA: Lactic Acid, Venous: 1.1 mmol/L (ref 0.5–1.9)

## 2019-05-17 LAB — URINALYSIS, ROUTINE W REFLEX MICROSCOPIC
Bilirubin Urine: NEGATIVE
Glucose, UA: NEGATIVE mg/dL
Hgb urine dipstick: NEGATIVE
Ketones, ur: NEGATIVE mg/dL
Leukocytes,Ua: NEGATIVE
Nitrite: NEGATIVE
Protein, ur: NEGATIVE mg/dL
Specific Gravity, Urine: 1.005 (ref 1.005–1.030)
pH: 7 (ref 5.0–8.0)

## 2019-05-17 LAB — COMPREHENSIVE METABOLIC PANEL
ALT: 22 U/L (ref 0–44)
AST: 23 U/L (ref 15–41)
Albumin: 2.8 g/dL — ABNORMAL LOW (ref 3.5–5.0)
Alkaline Phosphatase: 90 U/L (ref 38–126)
Anion gap: 9 (ref 5–15)
BUN: 11 mg/dL (ref 8–23)
CO2: 26 mmol/L (ref 22–32)
Calcium: 8.3 mg/dL — ABNORMAL LOW (ref 8.9–10.3)
Chloride: 97 mmol/L — ABNORMAL LOW (ref 98–111)
Creatinine, Ser: 0.9 mg/dL (ref 0.44–1.00)
GFR calc Af Amer: >60 mL/min (ref >60)
GFR calc non Af Amer: >60 mL/min (ref >60)
Glucose, Bld: 117 mg/dL — ABNORMAL HIGH (ref 70–99)
Potassium: 4 mmol/L (ref 3.5–5.1)
Sodium: 132 mmol/L — ABNORMAL LOW (ref 135–145)
Total Bilirubin: 0.8 mg/dL (ref 0.3–1.2)
Total Protein: 5.2 g/dL — ABNORMAL LOW (ref 6.5–8.1)

## 2019-05-17 MED ORDER — GABAPENTIN 300 MG PO CAPS
300.0000 mg | ORAL_CAPSULE | Freq: Four times a day (QID) | ORAL | Status: DC | PRN
  Administered 2019-05-18: 11:00:00 300 mg via ORAL
  Filled 2019-05-17: qty 1

## 2019-05-17 MED ORDER — SODIUM CHLORIDE 0.9% IV SOLUTION
Freq: Once | INTRAVENOUS | Status: AC
  Administered 2019-05-18: 02:00:00 via INTRAVENOUS

## 2019-05-17 MED ORDER — OXYCODONE-ACETAMINOPHEN 5-325 MG PO TABS
1.0000 | ORAL_TABLET | Freq: Four times a day (QID) | ORAL | Status: DC | PRN
  Administered 2019-05-18 (×2): 1 via ORAL
  Filled 2019-05-17 (×2): qty 1

## 2019-05-17 MED ORDER — ACETAMINOPHEN 500 MG PO TABS
1000.0000 mg | ORAL_TABLET | Freq: Three times a day (TID) | ORAL | Status: DC | PRN
  Administered 2019-05-17: 1000 mg via ORAL
  Filled 2019-05-17: qty 2

## 2019-05-17 MED ORDER — SODIUM CHLORIDE 0.9% FLUSH
3.0000 mL | Freq: Two times a day (BID) | INTRAVENOUS | Status: DC
  Administered 2019-05-18 (×2): 3 mL via INTRAVENOUS

## 2019-05-17 MED ORDER — LORAZEPAM 0.5 MG PO TABS
0.5000 mg | ORAL_TABLET | Freq: Two times a day (BID) | ORAL | Status: DC | PRN
  Administered 2019-05-18 (×2): 0.5 mg via ORAL
  Filled 2019-05-17 (×2): qty 1

## 2019-05-17 MED ORDER — MELATONIN 3 MG PO TABS
6.0000 mg | ORAL_TABLET | Freq: Every day | ORAL | Status: DC
  Administered 2019-05-18: 6 mg via ORAL
  Filled 2019-05-17 (×2): qty 2

## 2019-05-17 MED ORDER — ESCITALOPRAM OXALATE 20 MG PO TABS
20.0000 mg | ORAL_TABLET | Freq: Every day | ORAL | Status: DC
  Administered 2019-05-18: 11:00:00 20 mg via ORAL
  Filled 2019-05-17: qty 1

## 2019-05-17 MED ORDER — SODIUM CHLORIDE 0.9 % IV SOLN
INTRAVENOUS | Status: AC
  Administered 2019-05-18 (×2): via INTRAVENOUS

## 2019-05-17 MED ORDER — SODIUM CHLORIDE 0.9 % IV BOLUS
1000.0000 mL | Freq: Once | INTRAVENOUS | Status: AC
  Administered 2019-05-17: 20:00:00 1000 mL via INTRAVENOUS

## 2019-05-17 NOTE — ED Provider Notes (Addendum)
[facility] Provider Note   CSN: [id number] Arrival date & time: 05/17/19  9892    History   Chief Complaint Chief Complaint  Patient presents with  . Altered Mental Status    HPI Tanya Duncan is a 73 y.o. female.     73 yo F with a chief complaint of confusion.  Patient was found to be profoundly hypotensive with a blood pressure in the 60s.  She had right hip replacement surgery 3 days ago.  She feels that the wound has been great and her leg feels fine.  Does it does hurt when she moves around.  Feels much better than the last one that she had done.  She denies cough congestion or fever.  Has been feeling very cold.  Denies nausea vomiting or diarrhea.  Denies numbness or weakness to the leg.  The history is provided by the patient.  Altered Mental Status Associated symptoms: light-headedness (sometimes)   Associated symptoms: no fever, no headaches, no nausea, no palpitations and no vomiting   Illness Severity:  Mild Onset quality:  Gradual Duration:  2 days Timing:  Constant Progression:  Worsening Chronicity:  New Associated symptoms: no chest pain, no congestion, no fever, no headaches, no myalgias, no nausea, no rhinorrhea, no shortness of breath, no vomiting and no wheezing     Past Medical History:  Diagnosis Date  . Anxiety   . Arthritis   . Fibromyalgia   . Pneumonia 2005  . Stomach ulcer 1990    Patient Active Problem List   Diagnosis Date Noted  . Hypotension 05/17/2019  . Anemia 05/17/2019  . 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 Anxiety   . Primary osteoarthritis of right hip 05/14/2019    Past Surgical History:  Procedure Laterality Date  . ABDOMINAL HYSTERECTOMY    . LUMBAR FUSION  2015  . PYLOROPLASTY    . TOTAL HIP ARTHROPLASTY  2011  . TOTAL HIP ARTHROPLASTY Right 05/14/2019   Procedure: Right Anterior Hip Arthroplasty;  Surgeon: Marcene Corning, MD;  Location: [facility];  Service:  Orthopedics;  Laterality: Right;  . TOTAL SHOULDER REPLACEMENT  2014     OB History   No obstetric history on file.      Home Medications    Prior to Admission medications   Medication Sig Start Date End Date Taking? Authorizing Provider  apixaban (ELIQUIS) 2.5 MG TABS tablet Take 1 tablet (2.5 mg total) by mouth every 12 (twelve) hours. 05/15/19  Yes Elodia Florence, PA-C  Ascorbic Acid (VITAMIN C) 1000 MG tablet Take 2,000 mg by mouth daily.   Yes [provider]  b complex vitamins tablet Take 1 tablet by mouth daily.   Yes [provider]  escitalopram (LEXAPRO) 20 MG tablet Take 20 mg by mouth daily.   Yes [provider]  gabapentin (NEURONTIN) 300 MG capsule Take 300 mg by mouth 4 (four) times daily as needed (pain).    Yes [provider]  LORazepam (ATIVAN) 0.5 MG tablet Take 0.5 mg by mouth 2 (two) times daily as needed for anxiety.    Yes [provider]  Melatonin 5 MG TABS Take 5 mg by mouth at bedtime.   Yes [provider]  Multiple Vitamin (MULTIVITAMIN WITH MINERALS) TABS tablet Take 1 tablet by mouth daily.   Yes [provider]  nortriptyline (PAMELOR) 25 MG capsule Take 50 mg by mouth at bedtime.    Yes [provider]  omeprazole (PRILOSEC) 40 MG capsule TAKE 1 CAPSULE BY MOUTH DAILY. Patient taking differently: Take 40 mg by mouth at bedtime.  11/16/18  Yes Nandigam, Eleonore ChiquitoKavitha V, MD  oxyCODONE-acetaminophen (PERCOCET) 5-325 MG tablet Take 1-2 tablets by mouth every 6 (six) hours as needed for severe pain. 05/15/19 05/14/20 Yes Elodia FlorenceNida, Andrew, PA-C  psyllium (REGULOID) 0.52 g capsule Take 0.52 g by mouth daily.   Yes [provider]  tiZANidine (ZANAFLEX) 4 MG tablet Take 1 tablet (4 mg total) by mouth every 6 (six) hours as needed. Patient taking differently: Take 4 mg by mouth every 6 (six) hours as needed for muscle spasms.  05/15/19 05/14/20 Yes Elodia FlorenceNida, Andrew, PA-C  zolpidem (AMBIEN) 10 MG tablet Take  10 mg by mouth at bedtime as needed for sleep.    Yes [provider]  diphenoxylate-atropine (LOMOTIL) 2.5-0.025 MG tablet Take 2 tablets by mouth 4 (four) times daily as needed for diarrhea or loose stools. Patient not taking: Reported on 05/01/2019 05/09/18   Unk LightningLemmon, Jennifer Lynne, PA    Family History Family History  Problem Relation Age of Onset  . Colon cancer Father 6961    Social History Social History   Tobacco Use  . Smoking status: Never Smoker  . Smokeless tobacco: Never Used  Substance Use Topics  . Alcohol use: Yes    Comment: occasionally  . Drug use: Never     Allergies   Indocin [indomethacin], Sage [salvia officinalis], Sulfa antibiotics, and Avelox [moxifloxacin hcl in nacl]   Review of Systems Review of Systems  Constitutional: Negative for chills and fever.  HENT: Negative for congestion and rhinorrhea.   Eyes: Negative for redness and visual disturbance.  Respiratory: Negative for shortness of breath and wheezing.   Cardiovascular: Negative for chest pain and palpitations.  Gastrointestinal: Negative for nausea and vomiting.  Genitourinary: Negative for dysuria and urgency.  Musculoskeletal: Negative for arthralgias and myalgias.  Skin: Negative for pallor and wound.  Neurological: Positive for light-headedness (sometimes). Negative for dizziness and headaches.     Physical Exam Updated Vital Signs BP 118/85   Pulse 88   Temp 99.4 F (37.4 C) (Rectal)   Resp (!) 8   SpO2 100%   Physical Exam Vitals signs and nursing note reviewed.  Constitutional:      General: She is not in acute distress.    Appearance: She is well-developed. She is not diaphoretic.     Comments: Pallor  HENT:     Head: Normocephalic and atraumatic.  Eyes:     Pupils: Pupils are equal, round, and reactive to light.  Neck:     Musculoskeletal: Normal range of motion and neck supple.  Cardiovascular:     Rate and Rhythm: Normal rate and regular rhythm.      Heart sounds: No murmur. No friction rub. No gallop.   Pulmonary:     Effort: Pulmonary effort is normal.     Breath sounds: No wheezing or rales.  Abdominal:     General: There is no distension.     Palpations: Abdomen is soft.     Tenderness: There is no abdominal tenderness.  Musculoskeletal:        General: No tenderness.     Comments: Wound is clean dry and intact.  There is no drainage.  There is some mild erythema and warmth surrounding the area.  Very minimal bruising.  Skin:    General: Skin is warm and dry.  Neurological:     Mental Status: She  is alert and oriented to person, place, and time.  Psychiatric:        Behavior: Behavior normal.      ED Treatments / Results  Labs (all labs ordered are listed, but only abnormal results are displayed) Labs Reviewed  COMPREHENSIVE METABOLIC PANEL - Abnormal; Notable for the following components:      Result Value   Sodium 132 (*)    Chloride 97 (*)    Glucose, Bld 117 (*)    Calcium 8.3 (*)    Total Protein 5.2 (*)    Albumin 2.8 (*)    All other components within normal limits  CBC WITH DIFFERENTIAL/PLATELET - Abnormal; Notable for the following components:   RBC 2.68 (*)    Hemoglobin 7.7 (*)    HCT 24.6 (*)    Eosinophils Absolute 0.6 (*)    All other components within normal limits  URINALYSIS, ROUTINE W REFLEX MICROSCOPIC - Abnormal; Notable for the following components:   APPearance HAZY (*)    All other components within normal limits  CULTURE, BLOOD (ROUTINE X 2)  CULTURE, BLOOD (ROUTINE X 2)  URINE CULTURE  SARS CORONAVIRUS 2 (HOSPITAL ORDER, [facility])  LACTIC ACID, PLASMA  LACTIC ACID, PLASMA    EKG EKG Interpretation  Date/Time:  Friday May 17 2019 20:20:08 EDT Ventricular Rate:  83 PR Interval:    QRS Duration: 103 QT Interval:  391 QTC Calculation: 460 R Axis:   26 Text Interpretation:  Accelerated junctional rhythm Borderline low voltage, extremity leads  Abnormal R-wave progression, early transition Artifact in lead(s) I II III aVR aVL aVF V2 background noise Otherwise no significant change Confirmed by Deno Etienne [phone]) on 05/17/2019 8:44:38 PM   Radiology Dg Chest Port 1 View  Result Date: 05/17/2019 CLINICAL DATA:  Hypotension EXAM: PORTABLE CHEST 1 VIEW COMPARISON:  May 07, 2019 FINDINGS: The heart size is stable. There is no large pleural effusion. No pneumothorax. No focal infiltrate. The patient is status post total shoulder arthroplasty on the right. There is partially visualized orthopedic hardware involving the left humerus. There are old healed right-sided rib fractures. There may be some mild atelectasis at the left lung base. IMPRESSION: No active disease. Electronically Signed   By: Constance Holster M.D.   On: 05/17/2019 20:03    Procedures Procedures (including critical care time)  Medications Ordered in ED Medications  acetaminophen (TYLENOL) tablet 1,000 mg (1,000 mg Oral Given 05/17/19 2247)  oxyCODONE-acetaminophen (PERCOCET/ROXICET) 5-325 MG per tablet 1 tablet (has no administration in time range)  LORazepam (ATIVAN) tablet 0.5 mg (has no administration in time range)  Melatonin TABS 5 mg (has no administration in time range)  gabapentin (NEURONTIN) capsule 300 mg (has no administration in time range)  escitalopram (LEXAPRO) tablet 20 mg (has no administration in time range)  sodium chloride 0.9 % bolus 1,000 mL (0 mLs Intravenous Stopped 05/17/19 2249)     Initial Impression / Assessment and Plan / ED Course  I have reviewed the triage vital signs and the nursing notes.  Pertinent labs & imaging results that were available during my care of the patient were reviewed by me and considered in my medical decision making (see chart for details).        73 yo F with a chief complaint of confusion.  Patient was found to be hypotensive upon EMS arrival with a blood pressure in the 60s.  Very minimal improvement with IV  fluids in route.  Patient appears  pale on my exam.  No hemoglobin noted in the lab work post surgery.  Will obtain labs chest x-ray urine lactate give a bolus of IV fluids and reassess.  Patient's blood pressures improved with IV fluids.  Her hemoglobin is significantly lower than it was preoperation.  I discussed the case with Dr. Luiz BlareGraves [facility]  He felt that this was a grandma to lower than he would expect for a patient on Eliquis that was postop from a hip.  He will come and evaluate the patient in the hospital.  Will discuss with medicine for admission.  CRITICAL CARE Performed by: Rae Roamaniel Patrick Kaylor Simenson   Total critical care time: 35 minutes  Critical care time was exclusive of separately billable procedures and treating other patients.  Critical care was necessary to treat or prevent imminent or life-threatening deterioration.  Critical care was time spent personally by me on the following activities: development of treatment plan with patient and/or surrogate as well as nursing, discussions with consultants, evaluation of patient's response to treatment, examination of patient, obtaining history from patient or surrogate, ordering and performing treatments and interventions, ordering and review of laboratory studies, ordering and review of radiographic studies, pulse oximetry and re-evaluation of patient's condition.   Patient declines rectal exam at this time.  The patients results and plan were reviewed and discussed.   Any x-rays performed were independently reviewed by myself.   Differential diagnosis were considered with the presenting HPI.  Medications  acetaminophen (TYLENOL) tablet 1,000 mg (1,000 mg Oral Given 05/17/19 2247)  oxyCODONE-acetaminophen (PERCOCET/ROXICET) 5-325 MG per tablet 1 tablet (has no administration in time range)  LORazepam (ATIVAN) tablet 0.5 mg (has no administration in time range)  Melatonin TABS 5 mg (has no  administration in time range)  gabapentin (NEURONTIN) capsule 300 mg (has no administration in time range)  escitalopram (LEXAPRO) tablet 20 mg (has no administration in time range)  sodium chloride 0.9 % bolus 1,000 mL (0 mLs Intravenous Stopped 05/17/19 2249)    Vitals:   05/17/19 2045 05/17/19 2145 05/17/19 2200 05/17/19 2230  BP: 101/65 (!) 109/59 119/70 118/85  Pulse: 90 90 91 88  Resp: 13 11 10  (!) 8  Temp:      TempSrc:      SpO2: 95% 97% 100% 100%    Final diagnoses:  Blood loss anemia    Admission/ observation were discussed with the admitting physician, patient and/or family and they are comfortable with the plan.    Final Clinical Impressions(s) / ED Diagnoses   Final diagnoses:  Blood loss anemia    ED Discharge Orders    None       Melene PlanFloyd, Jalayna Josten, DO 05/17/19 2309    Melene PlanFloyd, Jadelynn Boylan, DO 05/27/19 1137

## 2019-05-17 NOTE — H&P (Signed)
History and Physical    Tanya Duncan NFA:[id number] DOB: Feb 03, 1946 DOA: 05/17/2019  PCP: Bernerd Limbo, MD  Patient coming from: Home  I have personally briefly reviewed patient's old medical records in [place]  Chief Complaint: Hypotensive episode, dizziness  HPI: Tanya Duncan is a 73 y.o. 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  She underwent elective right total hip arthroplasty on 05/14/2019 and was discharged on 05/16/2019.  Estimated blood loss was 325 mL per anesthesiology note.  She was started on Eliquis 2.5 mg twice daily for postoperative VTE prophylaxis.  She received 3 doses while in hospital with last documented dose morning of 05/16/2019.  Patient states she does not think she has taken this since discharge.  She has also been taking Percocet for pain chronically in addition to lorazepam, tizanidine, nortriptyline, and gabapentin.  She has been ambulating with the assistance of a walker and performing stair therapy up to 2 steps.  Around 3 PM patient began to feel lightheaded while sitting down with a room spinning sensation.  She began to feel "out of it" which her son also noticed.  She apparently "zoned out" and became cold and clammy.  She apparently did not lose consciousness.  EMS were called and per documentation she was noted to be hypotensive with a systolic blood pressure in the 60s.  Patient states that she has had low oral intake over the last few days.  She has continued right hip pain but has not noticed any significant changes since discharge yesterday.  She denies any subjective fevers, chest pain, dyspnea, abdominal pain, dysuria.  She reports a  remote history of GI bleeding due to ruptured gastric ulcer in the 1990s.  She denies any obvious bleeding including epistaxis, hemoptysis, hematemesis, hematuria, hematochezia, or melena.  She last had an upper endoscopy on 03/10/2014 at [facility] which did not show any ulcers.  She had a colonoscopy on 05/23/2018 by Dr. Fuller Plan which showed moderate diverticulosis of the left colon and small internal hemorrhoids.  ED Course:  Initial vitals showed BP 80/56, pulse 80, RR 16, temp 98.4 Fahrenheit, SPO2 91% on room air.  Labs are notable for hemoglobin 7.7 (12.1 on 05/07/2019), WBC 8.6, platelets are 211,000, sodium 132, potassium 4.0, BUN 11, creatinine 0.9, lactic acid 1.1.  Urinalysis was negative for UTI.  Blood cultures were obtained and pending.  SARS-CoV-2 test was obtained and pending.  Portable chest x-ray showed prior right shoulder arthroplasty and left humeral orthopedic hardware without focal consolidation, edema, or effusion.  Patient was given 1 L normal saline with improvement in blood pressure and mental status.  Patient declined rectal examination by EDP.  Orthopedics were consulted and will see in a.m.  The hospitalist service was consulted to admit for further evaluation and management.   Review of Systems: All systems reviewed and are negative except as documented in history of present illness above.   Past Medical History:  Diagnosis Date  . Anxiety   . Arthritis   . Fibromyalgia   . Pneumonia 2005  . Stomach ulcer 1990    Past Surgical History:  Procedure Laterality Date  . ABDOMINAL HYSTERECTOMY    . LUMBAR FUSION  2015  . PYLOROPLASTY    .  TOTAL HIP ARTHROPLASTY  2011  . TOTAL HIP ARTHROPLASTY Right 05/14/2019   Procedure: Right Anterior Hip Arthroplasty;  Surgeon: Marcene Corning, MD;  Location: [facility] ORS;  Service: Orthopedics;  Laterality: Right;  . TOTAL SHOULDER REPLACEMENT  2014    Social History:  reports that she has never smoked. She has  never used smokeless tobacco. She reports current alcohol use. She reports that she does not use drugs.  Allergies  Allergen Reactions  . Indocin [Indomethacin] Other (See Comments)    Dizziness  . Earnestine Leys Officinalis] Other (See Comments)    Heart races   . Sulfa Antibiotics Nausea And Vomiting  . Avelox [Moxifloxacin Hcl In Nacl] Palpitations    Family History  Problem Relation Age of Onset  . Colon cancer Father 58     Prior to Admission medications   Medication Sig Start Date End Date Taking? Authorizing Provider  apixaban (ELIQUIS) 2.5 MG TABS tablet Take 1 tablet (2.5 mg total) by mouth every 12 (twelve) hours. 05/15/19  Yes Elodia Florence, PA-C  Ascorbic Acid (VITAMIN C) 1000 MG tablet Take 2,000 mg by mouth daily.   Yes [provider]  b complex vitamins tablet Take 1 tablet by mouth daily.   Yes [provider]  escitalopram (LEXAPRO) 20 MG tablet Take 20 mg by mouth daily.   Yes [provider]  gabapentin (NEURONTIN) 300 MG capsule Take 300 mg by mouth 4 (four) times daily as needed (pain).    Yes [provider]  LORazepam (ATIVAN) 0.5 MG tablet Take 0.5 mg by mouth 2 (two) times daily as needed for anxiety.    Yes [provider]  Melatonin 5 MG TABS Take 5 mg by mouth at bedtime.   Yes [provider]  Multiple Vitamin (MULTIVITAMIN WITH MINERALS) TABS tablet Take 1 tablet by mouth daily.   Yes [provider]  nortriptyline (PAMELOR) 25 MG capsule Take 50 mg by mouth at bedtime.    Yes [provider]  omeprazole (PRILOSEC) 40 MG capsule TAKE 1 CAPSULE BY MOUTH DAILY. Patient taking differently: Take 40 mg by mouth at bedtime.  11/16/18  Yes Nandigam, Eleonore Chiquito, MD  oxyCODONE-acetaminophen (PERCOCET) 5-325 MG tablet Take 1-2 tablets by mouth every 6 (six) hours as needed for severe pain. 05/15/19 05/14/20 Yes Elodia Florence, PA-C  psyllium (REGULOID) 0.52 g capsule Take 0.52 g by mouth daily.   Yes  [provider]  tiZANidine (ZANAFLEX) 4 MG tablet Take 1 tablet (4 mg total) by mouth every 6 (six) hours as needed. Patient taking differently: Take 4 mg by mouth every 6 (six) hours as needed for muscle spasms.  05/15/19 05/14/20 Yes Elodia Florence, PA-C  zolpidem (AMBIEN) 10 MG tablet Take 10 mg by mouth at bedtime as needed for sleep.    Yes [provider]  diphenoxylate-atropine (LOMOTIL) 2.5-0.025 MG tablet Take 2 tablets by mouth 4 (four) times daily as needed for diarrhea or loose stools. Patient not taking: Reported on 05/01/2019 05/09/18   Unk Lightning, [place]    Physical Exam: Vitals:   05/17/19 1909 05/17/19 2000 05/17/19 2015 05/17/19 2027  BP:  (!) 95/55 96/62   Pulse:  80 82   Resp:  14 13   Temp:    99.4 F (37.4 C)  TempSrc:    Rectal  SpO2: 100% 94% 97%     Constitutional: Resting supine in bed, NAD, calm, comfortable Eyes: PERRL, lids and conjunctivae normal ENMT: Mucous membranes are dry. Posterior  pharynx clear of any exudate or lesions.Normal dentition.  Neck: normal, supple, no masses. Respiratory: clear to auscultation bilaterally, no wheezing, no crackles. Normal respiratory effort. No accessory muscle use.  Cardiovascular: Regular rate and rhythm, no murmurs / rubs / gallops. No extremity edema. 2+ pedal pulses. Abdomen: no tenderness, no masses palpated. No hepatosplenomegaly. Bowel sounds positive.  Musculoskeletal: Status post right hip arthroplasty, ROM diminished due to pain, right hip tender to palpation without significant swelling Skin: no rashes, lesions, ulcers. No induration Neurologic: CN 2-12 grossly intact. Sensation intact, Strength 5/5 in bilateral upper extremities, limited in the lower extremities due to pain from right hip plasty. Psychiatric: Normal judgment and insight. Alert and oriented x 3. Normal mood.     Labs on Admission: I have personally reviewed following labs and imaging studies  CBC: Recent Labs  Lab  05/17/19 1918  WBC 8.6  NEUTROABS 5.5  HGB 7.7*  HCT 24.6*  MCV 91.8  PLT 211   Basic Metabolic Panel: Recent Labs  Lab 05/17/19 1918  NA 132*  K 4.0  CL 97*  CO2 26  GLUCOSE 117*  BUN 11  CREATININE 0.90  CALCIUM 8.3*   GFR: Estimated Creatinine Clearance: 52.9 mL/min (by C-G formula based on SCr of 0.9 mg/dL). Liver Function Tests: Recent Labs  Lab 05/17/19 1918  AST 23  ALT 22  ALKPHOS 90  BILITOT 0.8  PROT 5.2*  ALBUMIN 2.8*   No results for input(s): LIPASE, AMYLASE in the last 168 hours. No results for input(s): AMMONIA in the last 168 hours. Coagulation Profile: No results for input(s): INR, PROTIME in the last 168 hours. Cardiac Enzymes: No results for input(s): CKTOTAL, CKMB, CKMBINDEX, TROPONINI in the last 168 hours. BNP (last 3 results) No results for input(s): PROBNP in the last 8760 hours. HbA1C: No results for input(s): HGBA1C in the last 72 hours. CBG: No results for input(s): GLUCAP in the last 168 hours. Lipid Profile: No results for input(s): CHOL, HDL, LDLCALC, TRIG, CHOLHDL, LDLDIRECT in the last 72 hours. Thyroid Function Tests: No results for input(s): TSH, T4TOTAL, FREET4, T3FREE, THYROIDAB in the last 72 hours. Anemia Panel: No results for input(s): VITAMINB12, FOLATE, FERRITIN, TIBC, IRON, RETICCTPCT in the last 72 hours. Urine analysis:    Component Value Date/Time   COLORURINE STRAW (A) 05/07/2019 1138   APPEARANCEUR CLEAR 05/07/2019 1138   LABSPEC 1.003 (L) 05/07/2019 1138   PHURINE 7.0 05/07/2019 1138   GLUCOSEU NEGATIVE 05/07/2019 1138   HGBUR NEGATIVE 05/07/2019 1138   BILIRUBINUR NEGATIVE 05/07/2019 1138   KETONESUR NEGATIVE 05/07/2019 1138   PROTEINUR NEGATIVE 05/07/2019 1138   NITRITE NEGATIVE 05/07/2019 1138   LEUKOCYTESUR NEGATIVE 05/07/2019 1138    Radiological Exams on Admission: Dg Chest Port 1 View  Result Date: 05/17/2019 CLINICAL DATA:  Hypotension EXAM: PORTABLE CHEST 1 VIEW COMPARISON:  May 07, 2019  FINDINGS: The heart size is stable. There is no large pleural effusion. No pneumothorax. No focal infiltrate. The patient is status post total shoulder arthroplasty on the right. There is partially visualized orthopedic hardware involving the left humerus. There are old healed right-sided rib fractures. There may be some mild atelectasis at the left lung base. IMPRESSION: No active disease. Electronically Signed   By: Katherine Mantlehristopher  Green M.D.   On: 05/17/2019 20:03    EKG: Independently reviewed. Sinus rhythm, early R wave progression, low voltage, motion artifact present.  Assessment/Plan Principal Problem:   Hypotension Active Problems:   Anemia   History of total right hip  arthroplasty   Anxiety  Sammye Featherston is a 73 y.o. female with medical history significant for osteoarthritis status post right total hip arthroplasty on 05/14/2019, GERD, remote history of GI bleed and peritonitis due to ruptured gastric ulcer, fibromyalgia, and anxiety who is admitted after hypotensive/near syncopal episode and new anemia.   Altered mental status due to hypotensive/near syncopal episode: Her altered mental status is likely due to an acute hypotensive episode and has resolved completely with IV fluid resuscitation.  Episode was likely multifactorial in nature from poor oral intake, medication side effect, and anemia.  She is not having any obvious bleeding however due to this event we will transfuse 1 unit PRBC. -Transfuse 1 unit PRBC -Continue IV fluid resuscitation overnight -Judicious use of pain control and antianxiety medications -Monitor in PCU overnight due to significant hypotension on arrival  Acute anemia: Hemoglobin 7.7 on admission compared to 12.1 on 05/07/2019.  There was recorded approximately 325 mL estimated blood loss during right hip arthroplasty.  She has only been on Eliquis for 2 days.  So far there is no obvious GI bleeding.  -Transfuse 1 unit PRBC as above -Obtain FOBT -Hold  Eliquis for now -Check posttransfusion H&H, CBC in a.m. -Continue PPI  Right hip osteoarthritis status post total right hip arthroplasty 05/14/19: Orthopedics consulted and will evaluate in a.m.  Patient has been weightbearing with walker over the last day. -Continue judicious pain control -Holding Eliquis VTE prophylaxis for now as above -Continue PT/OT  Anxiety: -Continue home lorazepam as needed with hold parameters -Continue home Lexapro  Fibromyalgia/neuropathy: -Continue home gabapentin -Hold nortriptyline for now   DVT prophylaxis: SCDs Code Status: Full code, confirmed with patient Family Communication: None present on admission, family aware per patient Disposition Plan: Likely discharge to home pending clinical progress Consults called: Orthopedics to see in a.m. Admission status: Observation   Darreld Mclean MD [facility]  If 7PM-7AM, please contact night-coverage [email or web address]  05/17/2019, 9:46 PM

## 2019-05-17 NOTE — Progress Notes (Signed)
I was called by the emergency room physician as a courtesy to let me know that the patient was being admitted to the medical service because of her hypotension and confused and altered state at home.  I appreciate the help of the hospitalist service and I will see her in the morning to address any potential orthopedic problems.  Certainly if it is felt that she is symptomatic because of her hemoglobin 7.7, transfusion would be appropriate.  Certainly I will defer to them to workup necessary to address the confused and altered state.  I am happy to see her at any time but at this point plan on seeing her in the morning.  Certainly her admitting team can call me  Directly on my cell phone should there be any questions on her orthopedic care.  She can be full weightbearing with the use of a walker , When that is felt to be appropriate.  Dorna Leitz [phone]

## 2019-05-17 NOTE — ED Notes (Signed)
Pt son Nicki Reaper [phone]

## 2019-05-17 NOTE — ED Triage Notes (Signed)
[facility] reports that patient got discharged yesterday for hip replacement surgery. Her son found her confused and altered. She was only responsive to pain, cold and clammy when EMS arrived. She has since woken up and is alert. EMS reports no hx of seizure or neuro deficits. Hypotensive around 78/40 the entire time.

## 2019-05-18 ENCOUNTER — Observation Stay (HOSPITAL_COMMUNITY): Payer: Medicare Other

## 2019-05-18 DIAGNOSIS — D6489 Other specified anemias: Secondary | ICD-10-CM

## 2019-05-18 DIAGNOSIS — F419 Anxiety disorder, unspecified: Secondary | ICD-10-CM

## 2019-05-18 DIAGNOSIS — Z96641 Presence of right artificial hip joint: Secondary | ICD-10-CM

## 2019-05-18 DIAGNOSIS — I9589 Other hypotension: Secondary | ICD-10-CM | POA: Diagnosis not present

## 2019-05-18 DIAGNOSIS — I959 Hypotension, unspecified: Secondary | ICD-10-CM | POA: Diagnosis not present

## 2019-05-18 LAB — BASIC METABOLIC PANEL
Anion gap: 9 (ref 5–15)
BUN: 8 mg/dL (ref 8–23)
CO2: 25 mmol/L (ref 22–32)
Calcium: 8.2 mg/dL — ABNORMAL LOW (ref 8.9–10.3)
Chloride: 105 mmol/L (ref 98–111)
Creatinine, Ser: 0.66 mg/dL (ref 0.44–1.00)
GFR calc Af Amer: >60 mL/min (ref >60)
GFR calc non Af Amer: >60 mL/min (ref >60)
Glucose, Bld: 97 mg/dL (ref 70–99)
Potassium: 4 mmol/L (ref 3.5–5.1)
Sodium: 139 mmol/L (ref 135–145)

## 2019-05-18 LAB — CBC
HCT: 27 % — ABNORMAL LOW (ref 36.0–46.0)
Hemoglobin: 8.8 g/dL — ABNORMAL LOW (ref 12.0–15.0)
MCH: 28.6 pg (ref 26.0–34.0)
MCHC: 32.6 g/dL (ref 30.0–36.0)
MCV: 87.7 fL (ref 80.0–100.0)
Platelets: 211 10*3/uL (ref 150–400)
RBC: 3.08 MIL/uL — ABNORMAL LOW (ref 3.87–5.11)
RDW: 13.1 % (ref 11.5–15.5)
WBC: 5.6 10*3/uL (ref 4.0–10.5)
nRBC: 0 % (ref 0.0–0.2)

## 2019-05-18 LAB — SARS CORONAVIRUS 2 BY RT PCR (HOSPITAL ORDER, PERFORMED IN ~~LOC~~ HOSPITAL LAB): SARS Coronavirus 2: NEGATIVE

## 2019-05-18 LAB — LACTIC ACID, PLASMA: Lactic Acid, Venous: 0.7 mmol/L (ref 0.5–1.9)

## 2019-05-18 LAB — PREPARE RBC (CROSSMATCH)

## 2019-05-18 LAB — ABO/RH: ABO/RH(D): O POS

## 2019-05-18 MED ORDER — OXYCODONE-ACETAMINOPHEN 5-325 MG PO TABS
1.0000 | ORAL_TABLET | Freq: Four times a day (QID) | ORAL | Status: DC | PRN
  Administered 2019-05-18: 2 via ORAL
  Filled 2019-05-18 (×2): qty 2

## 2019-05-18 MED ORDER — PANTOPRAZOLE SODIUM 40 MG PO TBEC
80.0000 mg | DELAYED_RELEASE_TABLET | Freq: Every day | ORAL | Status: DC
  Administered 2019-05-18: 80 mg via ORAL
  Filled 2019-05-18: qty 2

## 2019-05-18 NOTE — Plan of Care (Signed)
Patient ready for discharge. 

## 2019-05-18 NOTE — Progress Notes (Addendum)
Occupational Therapy Evaluation Patient Details Name: Tanya Duncan MRN: [id number] DOB: [birth year] Today's Date: 05/18/2019    History of Present Illness 73 yo female s/p R DA-THA on 05/14/19. PMH includes anxiety, fibromyalgia, PNA, lumbar fusion, L THA, R shoulder arthroplasty.   Clinical Impression   PTA, pt was living at home alone, and reports she was with ADL/IADL and functional mobility. Pt currently requires minguard-minA for ADL and functional mobility at RW level. Session limited secondary to staff arrival to take pt to Xray. Due to decline in current level of function, pt would benefit from acute OT to address established goals to facilitate safe D/C to venue listed below. Per MD, anticipate pt to d/c this date. If pt should remain in acute care, will continue to follow acutely.     Follow Up Recommendations  Follow surgeon's recommendation for DC plan and follow-up therapies;Supervision - Intermittent(for functional mobility )    Equipment Recommendations  3 in 1 bedside commode    Recommendations for Other Services       Precautions / Restrictions Precautions Precautions: Fall Restrictions Weight Bearing Restrictions: No Other Position/Activity Restrictions: WBAT      Mobility Bed Mobility               General bed mobility comments: pt sitting EOB upon arrival  Transfers Overall transfer level: Needs assistance Equipment used: Rolling walker (2 wheeled) Transfers: Sit to/from Stand Sit to Stand: Min guard         General transfer comment: verbal cues for safe hand placement    Balance Overall balance assessment: Mild deficits observed, not formally tested                                         ADL either performed or assessed with clinical judgement   ADL Overall ADL's : Needs assistance/impaired Eating/Feeding: Set up;Sitting   Grooming: Min guard;Standing   Upper Body Bathing: Set up;Sitting   Lower Body Bathing: Min  guard;Sit to/from stand   Upper Body Dressing : Set up;Sitting   Lower Body Dressing: Minimal assistance;Sit to/from stand Lower Body Dressing Details (indicate cue type and reason): minA for sit<>stand;minA to don LB clothing over R foot Toilet Transfer: Min Designer, jewellery Details (indicate cue type and reason): simulated  Toileting- Clothing Manipulation and Hygiene: Min guard;Sit to/from stand       Functional mobility during ADLs: Min guard;Rolling walker General ADL Comments: minguard for safety and stability     Vision Baseline Vision/History: Wears glasses Patient Visual Report: No change from baseline       Perception     Praxis      Pertinent Vitals/Pain Pain Assessment: Faces Faces Pain Scale: Hurts little more Pain Location: R hip Pain Descriptors / Indicators: Sore;Aching Pain Intervention(s): Monitored during session;Limited activity within patient's tolerance     Hand Dominance Right   Extremity/Trunk Assessment Upper Extremity Assessment Upper Extremity Assessment: Overall WFL for tasks assessed   Lower Extremity Assessment Lower Extremity Assessment: Defer to PT evaluation RLE Sensation: WNL   Cervical / Trunk Assessment Cervical / Trunk Assessment: Normal   Communication Communication Communication: No difficulties   Cognition Arousal/Alertness: Awake/alert Behavior During Therapy: WFL for tasks assessed/performed Overall Cognitive Status: Within Functional Limits for tasks assessed  General Comments  session cut short due to staff arrival to take pt to Xray    Exercises     Shoulder Instructions      Home Living Family/patient expects to be discharged to:: Private residence Living Arrangements: Alone Available Help at Discharge: Family;Available 24 hours/day(son to stay with her post-acutely) Type of Home: House Home Access: Stairs to enter [employer]  of Steps: 2 Entrance Stairs-Rails: Left Home Layout: One level(with 2 steps with L handrail inside)     Bathroom Shower/Tub: Producer, television/film/video: Standard     Home Equipment: None          Prior Functioning/Environment Level of Independence: Independent        Comments: Pt enjoys gardening        OT Problem List: Decreased strength;Decreased range of motion;Impaired balance (sitting and/or standing);Decreased activity tolerance;Decreased safety awareness;Decreased knowledge of use of DME or AE      OT Treatment/Interventions: Self-care/ADL training;Therapeutic exercise;Energy conservation;DME and/or AE instruction;Therapeutic activities;Patient/family education;Balance training    OT Goals(Current goals can be found in the care plan section) Acute Rehab OT Goals Patient Stated Goal: go home OT Goal Formulation: With patient Time For Goal Achievement: 06/01/19 Potential to Achieve Goals: Good ADL Goals Pt Will Perform Grooming: Independently;standing Pt Will Perform Upper Body Dressing: Independently Pt Will Perform Lower Body Dressing: with modified independence;sit to/from stand Pt Will Transfer to Toilet: with modified independence;ambulating Pt Will Perform Tub/Shower Transfer: with modified independence;ambulating  OT Frequency: Min 2X/week   Barriers to D/C: Decreased caregiver support  pt lives alone       Co-evaluation              AM-PAC OT "6 Clicks" Daily Activity     Outcome Measure Help from another person eating meals?: None Help from another person taking care of personal grooming?: A Little Help from another person toileting, which includes using toliet, bedpan, or urinal?: A Little Help from another person bathing (including washing, rinsing, drying)?: A Little Help from another person to put on and taking off regular upper body clothing?: A Little Help from another person to put on and taking off regular lower body clothing?:  A Little 6 Click Score: 19   End of Session Equipment Utilized During Treatment: Gait belt Nurse Communication: Mobility status;Other (comment)(Pt request for breakfast order)  Activity Tolerance: Patient tolerated treatment well Patient left: in bed;with call bell/phone within reach  OT Visit Diagnosis: Other abnormalities of gait and mobility (R26.89);Unsteadiness on feet (R26.81);Muscle weakness (generalized) (M62.81);Pain Pain - Right/Left: Right Pain - part of body: Hip;Leg                Time: 2505-3976 OT Time Calculation (min): 10 min Charges:  OT General Charges $OT Visit: 1 Visit OT Evaluation $OT Eval Moderate Complexity: 1 Mod  Diona Browner OTR/L Acute Rehabilitation Services Office: [phone]   Rebeca Alert 05/18/2019, 9:46 AM

## 2019-05-18 NOTE — Discharge Summary (Signed)
Physician Discharge Summary  Tanya Duncan GNF:[id number] DOB: 04/18/1946 DOA: 05/17/2019  PCP: Tracey HarriesBouska, David, MD  Admit date: 05/17/2019 Discharge date: 05/18/2019  Admitted From: Home Disposition:  Home with Home Health  Recommendations for Outpatient Follow-up:  1. Follow up with PCP in 1 week 2. Follow-up with orthopedics as scheduled  Home Health: Yes, previously set up during recent admission for hip replacement Equipment/Devices: Wheeled walker, 3 in 1 bedside commode  Discharge Condition: Stable CODE STATUS: Full code Diet recommendation: Regular diet  History of present illness:  Tanya Duncan is a 73 y.o. 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  She underwent elective right total hip arthroplasty on 05/14/2019 and was discharged on 05/16/2019.  Estimated blood loss was 325 mL per anesthesiology note.  She was started on Eliquis 2.5 mg twice daily for postoperative VTE prophylaxis.  She received 3 doses while in hospital with last documented dose morning of 05/16/2019.  Patient states she does not think she has taken this since discharge.  She has also been taking Percocet for pain chronically in addition to lorazepam, tizanidine, nortriptyline, and gabapentin.  She has been ambulating with the assistance of a walker and performing stair therapy up to 2 steps.  Around 3 PM patient began to feel lightheaded while sitting down with a room spinning sensation.  She began to feel "out of it" which her son also noticed.  She apparently "zoned out" and became cold and clammy.  She apparently did not lose consciousness.  EMS were called and per documentation she was noted to be  hypotensive with a systolic blood pressure in the 60s.  Patient states that she has had low oral intake over the last few days.  She has continued right hip pain but has not noticed any significant changes since discharge yesterday.  She denies any subjective fevers, chest pain, dyspnea, abdominal pain, dysuria.  She reports a remote history of GI bleeding due to ruptured gastric ulcer in the 1990s.  She denies any obvious bleeding including epistaxis, hemoptysis, hematemesis, hematuria, hematochezia, or melena.  She last had an upper end[facility] on 03/10/2014 [facility] which did not show any ulcers.  She had a colonoscopy on 05/23/2018 by Dr. Russella DarStark which showed moderate diverticulosis of the left colon and small internal hemorrhoids.  Hospital course:  Altered mental status due to hypotensive/near syncopal episode: Her altered mental status is likely due to an acute hypotensive episode and has resolved completely with IV fluid resuscitation.  Episode was likely multifactorial in nature from poor oral intake, medication side effect, and anemia.  She is not having any obvious bleeding however, patient was transfused with 1 unit PRBCs with slight downward drift of her hemoglobin from her most recent admission with recent hip surgery.  Patient's blood pressure has improved and normalized, hemoglobin up to 8.8.  Was seen by PT/OT with recommendations to discharge home with continued home health use.  Already has wheeled walker at home, OT recommends 3 in 1 bedside commode, DME order placed.  Acute anemia: Hemoglobin 7.7 on admission compared to 12.1 on 05/07/2019.  There was recorded approximately 325 mL estimated blood loss during right hip arthroplasty.  She has only been on Eliquis for 2  days.  So far there is no obvious GI bleeding.  Patient was transfused with 1 unit PRBC with appropriate rise of hemoglobin to 8.8.  Symptoms have resolved.  Will resume Eliquis and ready for discharge  home.  Right hip osteoarthritis status post total right hip arthroplasty 05/14/19: Orthopedics consulted and will evaluate in a.m.  Patient has been weightbearing with walker over the last day.  Seen by orthopedics, Dr. Luiz Blare this morning.  Hip x-ray without any acute findings.  Resume home Eliquis for VTE prophylaxis.  Resume home health PT/OT following discharge.  Anxiety: Continue home lorazepam and Lexapro  Fibromyalgia/neuropathy: Continue home gabapentin and nortriptyline   Discharge Diagnoses:  Active Problems:   History of total right hip arthroplasty   Anxiety    Discharge Instructions  Discharge Instructions    Call MD for:  difficulty breathing, headache or visual disturbances   Complete by: As directed    Call MD for:  extreme fatigue   Complete by: As directed    Call MD for:  persistant dizziness or light-headedness   Complete by: As directed    Call MD for:  persistant nausea and vomiting   Complete by: As directed    Call MD for:  redness, tenderness, or signs of infection (pain, swelling, redness, odor or green/yellow discharge around incision site)   Complete by: As directed    Call MD for:  severe uncontrolled pain   Complete by: As directed    Call MD for:  temperature >100.4   Complete by: As directed    Diet - low sodium heart healthy   Complete by: As directed    Increase activity slowly   Complete by: As directed      Allergies as of 05/18/2019      Reactions   Indocin [indomethacin] Other (See Comments)   Dizziness   Earnestine Leys Officinalis] Other (See Comments)   Heart races    Sulfa Antibiotics Nausea And Vomiting   Avelox [moxifloxacin Hcl In Nacl] Palpitations      Medication List    STOP taking these medications   diphenoxylate-atropine 2.5-0.025 MG tablet Commonly known as: Lomotil     TAKE these medications   apixaban 2.5 MG Tabs tablet Commonly known as: ELIQUIS Take 1 tablet (2.5 mg total) by mouth every 12 (twelve)  hours.   b complex vitamins tablet Take 1 tablet by mouth daily.   escitalopram 20 MG tablet Commonly known as: LEXAPRO Take 20 mg by mouth daily.   gabapentin 300 MG capsule Commonly known as: NEURONTIN Take 300 mg by mouth 4 (four) times daily as needed (pain).   LORazepam 0.5 MG tablet Commonly known as: ATIVAN Take 0.5 mg by mouth 2 (two) times daily as needed for anxiety.   Melatonin 5 MG Tabs Take 5 mg by mouth at bedtime.   multivitamin with minerals Tabs tablet Take 1 tablet by mouth daily.   nortriptyline 25 MG capsule Commonly known as: PAMELOR Take 50 mg by mouth at bedtime.   omeprazole 40 MG capsule Commonly known as: PRILOSEC TAKE 1 CAPSULE BY MOUTH DAILY. What changed: when to take this   oxyCODONE-acetaminophen 5-325 MG tablet Commonly known as: Percocet Take 1-2 tablets by mouth every 6 (six) hours as needed for severe pain.   psyllium 0.52 g capsule Commonly known as: REGULOID Take 0.52 g by mouth daily.   tiZANidine 4 MG tablet Commonly known as: Zanaflex Take 1 tablet (4 mg total) by mouth every 6 (six) hours  as needed. What changed: reasons to take this   vitamin C 1000 MG tablet Take 2,000 mg by mouth daily.   zolpidem 10 MG tablet Commonly known as: AMBIEN Take 10 mg by mouth at bedtime as needed for sleep.            Durable Medical Equipment  (From admission, onward)         Start     Ordered   05/18/19 1026  For home use only DME 3 n 1  Once     05/18/19 1025         Follow-up Information    Tracey Harries, MD. Call in 1 week(s).   Specialty: Family Medicine Contact information: [address] [place] [address] [phone]          Allergies  Allergen Reactions  . Indocin [Indomethacin] Other (See Comments)    Dizziness  . Earnestine Leys Officinalis] Other (See Comments)    Heart races   . Sulfa Antibiotics Nausea And Vomiting  . Avelox [Moxifloxacin Hcl In Nacl] Palpitations     Consultations:  Orthopedics, Dr. Luiz Blare   Procedures/Studies: Dg Chest 2 View  Result Date: 05/07/2019 CLINICAL DATA:  Preoperative study prior to hip replacement. EXAM: CHEST - 2 VIEW COMPARISON:  None. FINDINGS: Probable healed right rib fractures. The heart, hila, mediastinum, lungs, and pleura are otherwise unremarkable. IMPRESSION: No acute abnormalities are noted. Electronically Signed   By: Gerome Sam III M.D   On: 05/07/2019 16:17   Dg Chest Port 1 View  Result Date: 05/17/2019 CLINICAL DATA:  Hypotension EXAM: PORTABLE CHEST 1 VIEW COMPARISON:  May 07, 2019 FINDINGS: The heart size is stable. There is no large pleural effusion. No pneumothorax. No focal infiltrate. The patient is status post total shoulder arthroplasty on the right. There is partially visualized orthopedic hardware involving the left humerus. There are old healed right-sided rib fractures. There may be some mild atelectasis at the left lung base. IMPRESSION: No active disease. Electronically Signed   By: Katherine Mantle M.D.   On: 05/17/2019 20:03   Dg C-arm 1-60 Min-no Report  Result Date: 05/14/2019 Fluoroscopy was utilized by the requesting physician.  No radiographic interpretation.   Dg Hip Operative Unilat W Or W/o Pelvis Right  Result Date: 05/14/2019 CLINICAL DATA:  Right hip replacement. FLUOROSCOPY TIME:  39 seconds EXAM: OPERATIVE RIGHT HIP (WITH PELVIS IF PERFORMED) 2 VIEWS TECHNIQUE: Fluoroscopic spot image(s) were submitted for interpretation post-operatively. COMPARISON:  None. FINDINGS: Patient is status post right hip replacement. Acetabular and femoral components are in good position. IMPRESSION: Right hip replacement as above. Electronically Signed   By: Gerome Sam III M.D   On: 05/14/2019 11:47   Dg Hip Unilat With Pelvis 2-3 Views Right  Result Date: 05/18/2019 CLINICAL DATA:  Pain EXAM: DG HIP (WITH OR WITHOUT PELVIS) 2-3V RIGHT COMPARISON:  May 14, 2019 intraoperative  examination FINDINGS: Frontal pelvis as well as frontal and lateral right hip images obtained. There are total hip replacements bilaterally. Prostheses appear well seated bilaterally. There is subcutaneous air on the right, likely due to recent surgery. No acute fracture or dislocation. No erosive change. Postoperative change noted in the mid to lower lumbar spine. 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 IMPRESSION: Bilateral total hip replacements with recent postoperative change on the right. Prosthetic components appear well seated bilaterally. 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 No acute fracture or dislocation. Postoperative change noted in the lumbar  spine. Electronically Signed   By: Bretta BangWilliam  Woodruff III M.D.   On: 05/18/2019 10:17       Subjective: Patient seen and examined at bedside, resting comfortably.  She currently feels back to her normal baseline.  Blood pressure has now normalized.  Requesting if she could discharge home this afternoon.  Evaluated by orthopedics, Dr. Luiz BlareGraves and PT/OT without any further recommendations.  Patient reports very poor oral intake since previous discharge, now appetite improved this morning.  Ready for discharge home.  No other complaints/concerns this morning.  Denies headache, no fever/chills/night sweats, no nausea/vomiting/diarrhea, no chest pain, no palpitations, no shortness of breath, no abdominal pain, no weakness, no paresthesias.  No acute events overnight per nursing.   Discharge Exam: Vitals:   05/18/19 0500 05/18/19 0822  BP:  118/79  Pulse: 79 88  Resp: 16 11  Temp:  98 F (36.7 C)  SpO2: 96% 95%   Vitals:   05/18/19 0205 05/18/19 0448 05/18/19 0500 05/18/19 0822  BP:  120/64  118/79  Pulse: 83 80 79 88  Resp:  14 16 11   Temp: 98.2 F (36.8 C) 98.1 F (36.7 C)  98 F (36.7 C)  TempSrc: Oral Oral  Oral  SpO2: 98% 98% 96% 95%  Weight:   77.5 kg     General: Pt is alert, awake, not in acute  distress Cardiovascular: RRR, S1/S2 +, no rubs, no gallops Respiratory: CTA bilaterally, no wheezing, no rhonchi Abdominal: Soft, NT, ND, bowel sounds + Extremities: no edema, no cyanosis, right hip surgical incision noted, dressing in place, clean/dry/intact    The results of significant diagnostics from this hospitalization (including imaging, microbiology, ancillary and laboratory) are listed below for reference.     Microbiology: Recent Results (from the past 240 hour(s))  SARS Coronavirus 2 (Performed in [facility] hospital lab)     Status: None   Collection Time: 05/10/19 11:39 AM   Specimen: Nasal Swab  Result Value Ref Range Status   SARS Coronavirus 2 NEGATIVE NEGATIVE Final    Comment: (NOTE) SARS-CoV-2 target nucleic acids are NOT DETECTED. The SARS-CoV-2 RNA is generally detectable in upper and lower respiratory specimens during the acute phase of infection. Negative results do not preclude SARS-CoV-2 infection, do not rule out co-infections with other pathogens, and should not be used as the sole basis for treatment or other patient management decisions. Negative results must be combined with clinical observations, patient history, and epidemiological information. The expected result is Negative. Fact Sheet for Patients: HairSlick.nohttps://www.fda.gov/media/138098/download Fact Sheet for Healthcare Providers: quierodirigir.comhttps://www.fda.gov/media/138095/download This test is not yet approved or cleared by the [place] FDA and  has been authorized for detection and/or diagnosis of SARS-CoV-2 by FDA under an Emergency Use Authorization (EUA). This EUA will remain  in effect (meaning this test can be used) for the duration of the COVID-19 declaration under Section 56 4(b)(1) of the Act, 21 U.S.C. section 360bbb-3(b)(1), unless the authorization is terminated or revoked sooner. Performed at [facility], [address] [place], [place] [address]   SARS Coronavirus 2 (CEPHEID  - Performed in [facility] hospital lab), Hosp Order     Status: None   Collection Time: 05/17/19 10:51 PM   Specimen: Nasopharyngeal Swab  Result Value Ref Range Status   SARS Coronavirus 2 NEGATIVE NEGATIVE Final    Comment: (NOTE) If result is NEGATIVE SARS-CoV-2 target nucleic acids are NOT DETECTED. The SARS-CoV-2 RNA is generally detectable in upper and lower  respiratory specimens during  the acute phase of infection. The lowest  concentration of SARS-CoV-2 viral copies this assay can detect is 250  copies / mL. A negative result does not preclude SARS-CoV-2 infection  and should not be used as the sole basis for treatment or other  patient management decisions.  A negative result may occur with  improper specimen collection / handling, submission of specimen other  than nasopharyngeal swab, presence of viral mutation(s) within the  areas targeted by this assay, and inadequate number of viral copies  (<250 copies / mL). A negative result must be combined with clinical  observations, patient history, and epidemiological information. If result is POSITIVE SARS-CoV-2 target nucleic acids are DETECTED. The SARS-CoV-2 RNA is generally detectable in upper and lower  respiratory specimens dur ing the acute phase of infection.  Positive  results are indicative of active infection with SARS-CoV-2.  Clinical  correlation with patient history and other diagnostic information is  necessary to determine patient infection status.  Positive results do  not rule out bacterial infection or co-infection with other viruses. If result is PRESUMPTIVE POSTIVE SARS-CoV-2 nucleic acids MAY BE PRESENT.   A presumptive positive result was obtained on the submitted specimen  and confirmed on repeat testing.  While 2019 novel coronavirus  (SARS-CoV-2) nucleic acids may be present in the submitted sample  additional confirmatory testing may be necessary for epidemiological  and / or clinical management  purposes  to differentiate between  SARS-CoV-2 and other Sarbecovirus currently known to infect humans.  If clinically indicated additional testing with an alternate test  methodology [phone]) is advised. The SARS-CoV-2 RNA is generally  detectable in upper and lower respiratory sp ecimens during the acute  phase of infection. The expected result is Negative. Fact Sheet for Patients:  BoilerBrush.com.cy Fact Sheet for Healthcare Providers: https://pope.com/ This test is not yet approved or cleared by the [place] FDA and has been authorized for detection and/or diagnosis of SARS-CoV-2 by FDA under an Emergency Use Authorization (EUA).  This EUA will remain in effect (meaning this test can be used) for the duration of the COVID-19 declaration under Section 564(b)(1) of the Act, 21 U.S.C. section 360bbb-3(b)(1), unless the authorization is terminated or revoked sooner. Performed at [facility], [address], [place], [place] [address]      Labs: BNP (last 3 results) No results for input(s): BNP in the last 8760 hours. Basic Metabolic Panel: Recent Labs  Lab 05/17/19 1918 05/18/19 0552  NA 132* 139  K 4.0 4.0  CL 97* 105  CO2 26 25  GLUCOSE 117* 97  BUN 11 8  CREATININE 0.90 0.66  CALCIUM 8.3* 8.2*   Liver Function Tests: Recent Labs  Lab 05/17/19 1918  AST 23  ALT 22  ALKPHOS 90  BILITOT 0.8  PROT 5.2*  ALBUMIN 2.8*   No results for input(s): LIPASE, AMYLASE in the last 168 hours. No results for input(s): AMMONIA in the last 168 hours. CBC: Recent Labs  Lab 05/17/19 1918 05/18/19 0552  WBC 8.6 5.6  NEUTROABS 5.5  --   HGB 7.7* 8.8*  HCT 24.6* 27.0*  MCV 91.8 87.7  PLT 211 211   Cardiac Enzymes: No results for input(s): CKTOTAL, CKMB, CKMBINDEX, TROPONINI in the last 168 hours. BNP: Invalid input(s): POCBNP CBG: No results for input(s): GLUCAP in the last 168 hours. D-Dimer No results  for input(s): DDIMER in the last 72 hours. Hgb A1c No results for input(s): HGBA1C in the last 72 hours.  Lipid Profile No results for input(s): CHOL, HDL, LDLCALC, TRIG, CHOLHDL, LDLDIRECT in the last 72 hours. Thyroid function studies No results for input(s): TSH, T4TOTAL, T3FREE, THYROIDAB in the last 72 hours.  Invalid input(s): FREET3 Anemia work up No results for input(s): VITAMINB12, FOLATE, FERRITIN, TIBC, IRON, RETICCTPCT in the last 72 hours. Urinalysis    Component Value Date/Time   COLORURINE YELLOW 05/17/2019 2122   APPEARANCEUR HAZY (A) 05/17/2019 2122   LABSPEC 1.005 05/17/2019 2122   PHURINE 7.0 05/17/2019 2122   GLUCOSEU NEGATIVE 05/17/2019 2122   HGBUR NEGATIVE 05/17/2019 2122   BILIRUBINUR NEGATIVE 05/17/2019 2122   KETONESUR NEGATIVE 05/17/2019 2122   PROTEINUR NEGATIVE 05/17/2019 2122   NITRITE NEGATIVE 05/17/2019 2122   LEUKOCYTESUR NEGATIVE 05/17/2019 2122   Sepsis Labs Invalid input(s): PROCALCITONIN,  WBC,  LACTICIDVEN Microbiology Recent Results (from the past 240 hour(s))  SARS Coronavirus 2 (Performed in [facility] hospital lab)     Status: None   Collection Time: 05/10/19 11:39 AM   Specimen: Nasal Swab  Result Value Ref Range Status   SARS Coronavirus 2 NEGATIVE NEGATIVE Final    Comment: (NOTE) SARS-CoV-2 target nucleic acids are NOT DETECTED. The SARS-CoV-2 RNA is generally detectable in upper and lower respiratory specimens during the acute phase of infection. Negative results do not preclude SARS-CoV-2 infection, do not rule out co-infections with other pathogens, and should not be used as the sole basis for treatment or other patient management decisions. Negative results must be combined with clinical observations, patient history, and epidemiological information. The expected result is Negative. Fact Sheet for Patients: HairSlick.no Fact Sheet for Healthcare  Providers: quierodirigir.com This test is not yet approved or cleared by the [place] FDA and  has been authorized for detection and/or diagnosis of SARS-CoV-2 by FDA under an Emergency Use Authorization (EUA). This EUA will remain  in effect (meaning this test can be used) for the duration of the COVID-19 declaration under Section 56 4(b)(1) of the Act, 21 U.S.C. section 360bbb-3(b)(1), unless the authorization is terminated or revoked sooner. Performed at [facility], [address], [place], [place] [address]   SARS Coronavirus 2 (CEPHEID - Performed in [facility] hospital lab), Hosp Order     Status: None   Collection Time: 05/17/19 10:51 PM   Specimen: Nasopharyngeal Swab  Result Value Ref Range Status   SARS Coronavirus 2 NEGATIVE NEGATIVE Final    Comment: (NOTE) If result is NEGATIVE SARS-CoV-2 target nucleic acids are NOT DETECTED. The SARS-CoV-2 RNA is generally detectable in upper and lower  respiratory specimens during the acute phase of infection. The lowest  concentration of SARS-CoV-2 viral copies this assay can detect is 250  copies / mL. A negative result does not preclude SARS-CoV-2 infection  and should not be used as the sole basis for treatment or other  patient management decisions.  A negative result may occur with  improper specimen collection / handling, submission of specimen other  than nasopharyngeal swab, presence of viral mutation(s) within the  areas targeted by this assay, and inadequate number of viral copies  (<250 copies / mL). A negative result must be combined with clinical  observations, patient history, and epidemiological information. If result is POSITIVE SARS-CoV-2 target nucleic acids are DETECTED. The SARS-CoV-2 RNA is generally detectable in upper and lower  respiratory specimens dur ing the acute phase of infection.  Positive  results are indicative of active infection with SARS-CoV-2.  Clinical   correlation with patient history and  other diagnostic information is  necessary to determine patient infection status.  Positive results do  not rule out bacterial infection or co-infection with other viruses. If result is PRESUMPTIVE POSTIVE SARS-CoV-2 nucleic acids MAY BE PRESENT.   A presumptive positive result was obtained on the submitted specimen  and confirmed on repeat testing.  While 2019 novel coronavirus  (SARS-CoV-2) nucleic acids may be present in the submitted sample  additional confirmatory testing may be necessary for epidemiological  and / or clinical management purposes  to differentiate between  SARS-CoV-2 and other Sarbecovirus currently known to infect humans.  If clinically indicated additional testing with an alternate test  methodology [phone]) is advised. The SARS-CoV-2 RNA is generally  detectable in upper and lower respiratory sp ecimens during the acute  phase of infection. The expected result is Negative. Fact Sheet for Patients:  StrictlyIdeas.no Fact Sheet for Healthcare Providers: BankingDealers.co.za This test is not yet approved or cleared by the [place] FDA and has been authorized for detection and/or diagnosis of SARS-CoV-2 by FDA under an Emergency Use Authorization (EUA).  This EUA will remain in effect (meaning this test can be used) for the duration of the COVID-19 declaration under Section 564(b)(1) of the Act, 21 U.S.C. section 360bbb-3(b)(1), unless the authorization is terminated or revoked sooner. Performed at [facility], [place] [address] [place], [place] [address]      Time coordinating discharge: Over 30 minutes  SIGNED:   Donnamarie Poag [place] DO  [facility] 05/18/2019, 11:52 AM

## 2019-05-18 NOTE — Evaluation (Signed)
Physical Therapy Evaluation Patient Details Name: Tanya Duncan MRN: [id number] DOB: 1946/09/01 Today's Date: 05/18/2019   History of Present Illness  73 yo female s/p R DA-THA on 05/14/19. PMH includes anxiety, fibromyalgia, PNA, lumbar fusion, L THA, R shoulder arthroplasty.  Clinical Impression  This extremely pleasant 73 yo female was evaluated for mobility and note her pulse was up to 97 after gait and 88 baseline.  Her plan is to return home after this admit, to resume HHPT and to be followed up with orthopedic as needed.  Will follow acutely and may benefit to retry steps, although her ability to complete this task was validated in the last couple days and at home.  Will monitor for any alterations of vitals that indicate difficulty with maintaining balance and safety at home, progress gait and balance and will work on RLE strengthening.    Follow Up Recommendations Home health PT;Supervision - Intermittent;Other (comment)(HHPT was in place from last dc)    Equipment Recommendations  None recommended by PT(Has RW from last admit)    Recommendations for Other Services OT consult     Precautions / Restrictions Precautions Precautions: Fall Precaution Comments: monitor her vitals Restrictions Weight Bearing Restrictions: Yes Other Position/Activity Restrictions: WBAT      Mobility  Bed Mobility Overal bed mobility: Needs Assistance             General bed mobility comments: EOB when PT arrived  Transfers Overall transfer level: Needs assistance Equipment used: Rolling walker (2 wheeled) Transfers: Sit to/from Stand Sit to Stand: Min guard         General transfer comment: pt is able to power up to stand and pull up clothing  Ambulation/Gait Ambulation/Gait assistance: Min guard Gait Distance (Feet): 100 Feet Assistive device: Rolling walker (2 wheeled) Gait Pattern/deviations: Step-through pattern;Decreased stride length;Wide base of support Gait velocity:  reduced Gait velocity interpretation: <1.31 ft/sec, indicative of household ambulator General Gait Details: pt is familiar with turning and obstacle clearance  Stairs            Wheelchair Mobility    Modified Rankin (Stroke Patients Only)       Balance Overall balance assessment: Needs assistance(pt is controlling balance with RW and has mainly pain issues) Sitting-balance support: Feet supported Sitting balance-Leahy Scale: Good     Standing balance support: Bilateral upper extremity supported;During functional activity Standing balance-Leahy Scale: Fair                               Pertinent Vitals/Pain Pain Assessment: 0-10 Pain Score: 7  Faces Pain Scale: Hurts little more Pain Location: R hip Pain Descriptors / Indicators: Operative site guarding Pain Intervention(s): Limited activity within patient's tolerance;Monitored during session;Repositioned;RN gave pain meds during session    [place] expects to be discharged to:: Private residence Living Arrangements: Alone Available Help at Discharge: Family;Available 24 hours/day Type of Home: House Home Access: Stairs to enter Entrance Stairs-Rails: Left Entrance Stairs-Number of Steps: 2 Home Layout: One level Home Equipment: Walker - 2 wheels      Prior Function Level of Independence: Independent         Comments: Pt enjoys gardening     Hand Dominance   Dominant Hand: Right    Extremity/Trunk Assessment   Upper Extremity Assessment Upper Extremity Assessment: Overall WFL for tasks assessed    Lower Extremity Assessment Lower Extremity Assessment: RLE deficits/detail RLE Deficits / Details: R hip weakness,  affecting knee and ankle with pain RLE Sensation: WNL RLE Coordination: decreased gross motor    Cervical / Trunk Assessment Cervical / Trunk Assessment: Normal  Communication   Communication: No difficulties  Cognition Arousal/Alertness:  Awake/alert Behavior During Therapy: WFL for tasks assessed/performed Overall Cognitive Status: Within Functional Limits for tasks assessed                                        General Comments General comments (skin integrity, edema, etc.): pt is accustomed to maneuvering in the hosp clearly from recent stay, is careful to keep PT aware of how she is feeling, has been transfused     Exercises     Assessment/Plan    PT Assessment Patient needs continued PT services  PT Problem List Decreased strength;Decreased range of motion;Decreased activity tolerance;Decreased balance;Decreased mobility;Decreased coordination;Decreased skin integrity;Pain       PT Treatment Interventions      PT Goals (Current goals can be found in the Care Plan section)  Acute Rehab PT Goals Patient Stated Goal: go home PT Goal Formulation: With patient Time For Goal Achievement: 05/25/19 Potential to Achieve Goals: Good    Frequency Min 4X/week   Barriers to discharge Inaccessible home environment;Decreased caregiver support home alone with stairs to enter house    Co-evaluation               AM-PAC PT "6 Clicks" Mobility  Outcome Measure Help needed turning from your back to your side while in a flat bed without using bedrails?: None Help needed moving from lying on your back to sitting on the side of a flat bed without using bedrails?: A Little Help needed moving to and from a bed to a chair (including a wheelchair)?: A Little Help needed standing up from a chair using your arms (e.g., wheelchair or bedside chair)?: A Little Help needed to walk in hospital room?: A Little Help needed climbing 3-5 steps with a railing? : A Little 6 Click Score: 19    End of Session Equipment Utilized During Treatment: Gait belt Activity Tolerance: Patient tolerated treatment well;Patient limited by pain Patient left: in bed;with call bell/phone within reach;with nursing/sitter in  room Nurse Communication: Mobility status PT Visit Diagnosis: Unsteadiness on feet (R26.81);Muscle weakness (generalized) (M62.81);Pain Pain - Right/Left: Right Pain - part of body: Hip    Time: 7026-3785 PT Time Calculation (min) (ACUTE ONLY): 20 min   Charges:   PT Evaluation $PT Eval Moderate Complexity: 1 Mod         Ivar Drape 05/18/2019, 10:58 AM  Samul Dada, PT MS Acute Rehab Dept. Number: [facility] [id number] and [facility] [phone]

## 2019-05-18 NOTE — Progress Notes (Signed)
Admitted from [place] and oriented.,attached to cardiac monitoring,CCMD notified,V/S checked.Limited movement of right leg due to recent hip arthroplasty.

## 2019-05-18 NOTE — Care Management (Signed)
Patient states she does not need 3n1 or other equipment.

## 2019-05-18 NOTE — Progress Notes (Signed)
PT Cancellation Note  Patient Details Name: Tanya Duncan MRN: [id number] DOB: 11-Jan-1946   Cancelled Treatment:    Reason Eval/Treat Not Completed: Patient at procedure or test/unavailable.  Just being sent to get imaging as PT arrived, will re-attempt at another time.   Ramond Dial 05/18/2019, 9:39 AM  Mee Hives, PT MS Acute Rehab Dept. Number: [place] and [place]

## 2019-05-18 NOTE — Consult Note (Signed)
Reason for Consult:Status post right total hip replacement in a patient who is readmitted for mental status issues Referring Physician: Hospitalists  Tanya Duncan is an 73 y.o. female.  HPI: The patient is well known to our practice.  She underwent total hip replacement on Tuesday.  She had some pain after her surgery and was a little slow to progress.  She ultimately went home Thursday.  She did well through the evening on Thursday.  Friday morning she was getting up and doing reasonably well and may have had a little bit more pain.  She ultimately was found by her son with what he felt were some mental status changes and he brought her in to be seen.  She was extremely hypotensive in the emergency room and had hemoglobin of 7.7.  Ultimately she was admitted to the hospitalist service and underwent transfusion.  This morning she feels quite good except for some ongoing pain in the right hip.  We are consult it for evaluation and management of her total hip replacement.  Past Medical History:  Diagnosis Date  . Anxiety   . Arthritis   . Fibromyalgia   . Pneumonia 2005  . Stomach ulcer 1990    Past Surgical History:  Procedure Laterality Date  . ABDOMINAL HYSTERECTOMY    . LUMBAR FUSION  2015  . PYLOROPLASTY    . TOTAL HIP ARTHROPLASTY  2011  . TOTAL HIP ARTHROPLASTY Right 05/14/2019   Procedure: Right Anterior Hip Arthroplasty;  Surgeon: Marcene Corningalldorf, Peter, MD;  Location: [facility];  Service: Orthopedics;  Laterality: Right;  . TOTAL SHOULDER REPLACEMENT  2014    Family History  Problem Relation Age of Onset  . Colon cancer Father 5061    Social History:  reports that she has never smoked. She has never used smokeless tobacco. She reports current alcohol use. She reports that she does not use drugs.  Allergies:  Allergies  Allergen Reactions  . Indocin [Indomethacin] Other (See Comments)    Dizziness  . Earnestine LeysSage [Salvia Officinalis] Other (See Comments)    Heart races   . Sulfa  Antibiotics Nausea And Vomiting  . Avelox [Moxifloxacin Hcl In Nacl] Palpitations    Medications: I have reviewed the patient's current medications.  Results for orders placed or performed during the hospital encounter of 05/17/19 (from the past 48 hour(s))  Comprehensive metabolic panel     Status: Abnormal   Collection Time: 05/17/19  7:18 PM  Result Value Ref Range   Sodium 132 (L) 135 - 145 mmol/L   Potassium 4.0 3.5 - 5.1 mmol/L   Chloride 97 (L) 98 - 111 mmol/L   CO2 26 22 - 32 mmol/L   Glucose, Bld 117 (H) 70 - 99 mg/dL   BUN 11 8 - 23 mg/dL   Creatinine, Ser 1.610.90 0.44 - 1.00 mg/dL   Calcium 8.3 (L) 8.9 - 10.3 mg/dL   Total Protein 5.2 (L) 6.5 - 8.1 g/dL   Albumin 2.8 (L) 3.5 - 5.0 g/dL   AST 23 15 - 41 U/L   ALT 22 0 - 44 U/L   Alkaline Phosphatase 90 38 - 126 U/L   Total Bilirubin 0.8 0.3 - 1.2 mg/dL   GFR calc non Af Amer >60 >60 mL/min   GFR calc Af Amer >60 >60 mL/min   Anion gap 9 5 - 15    Comment: Performed at [facility], [address] [place], [place] [address]  CBC WITH DIFFERENTIAL     Status:  Abnormal   Collection Time: 05/17/19  7:18 PM  Result Value Ref Range   WBC 8.6 4.0 - 10.5 K/uL   RBC 2.68 (L) 3.87 - 5.11 MIL/uL   Hemoglobin 7.7 (L) 12.0 - 15.0 g/dL   HCT 47.4 (L) 25.9 - 56.3 %   MCV 91.8 80.0 - 100.0 fL   MCH 28.7 26.0 - 34.0 pg   MCHC 31.3 30.0 - 36.0 g/dL   RDW 87.5 64.3 - 32.9 %   Platelets 211 150 - 400 K/uL   nRBC 0.0 0.0 - 0.2 %   Neutrophils Relative % 64 %   Neutro Abs 5.5 1.7 - 7.7 K/uL   Lymphocytes Relative 20 %   Lymphs Abs 1.8 0.7 - 4.0 K/uL   Monocytes Relative 9 %   Monocytes Absolute 0.7 0.1 - 1.0 K/uL   Eosinophils Relative 6 %   Eosinophils Absolute 0.6 (H) 0.0 - 0.5 K/uL   Basophils Relative 0 %   Basophils Absolute 0.0 0.0 - 0.1 K/uL   Immature Granulocytes 1 %   Abs Immature Granulocytes 0.05 0.00 - 0.07 K/uL    Comment: Performed at [facility], [address], [place], [place] [address]  Lactic  acid, plasma     Status: None   Collection Time: 05/17/19  7:18 PM  Result Value Ref Range   Lactic Acid, Venous 1.1 0.5 - 1.9 mmol/L    Comment: Performed at [facility], [address] [place], [place] [address]  Urinalysis, Routine w reflex microscopic (not at [facility])     Status: Abnormal   Collection Time: 05/17/19  9:22 PM  Result Value Ref Range   Color, Urine YELLOW YELLOW   APPearance HAZY (A) CLEAR   Specific Gravity, Urine 1.005 1.005 - 1.030   pH 7.0 5.0 - 8.0   Glucose, UA NEGATIVE NEGATIVE mg/dL   Hgb urine dipstick NEGATIVE NEGATIVE   Bilirubin Urine NEGATIVE NEGATIVE   Ketones, ur NEGATIVE NEGATIVE mg/dL   Protein, ur NEGATIVE NEGATIVE mg/dL   Nitrite NEGATIVE NEGATIVE   Leukocytes,Ua NEGATIVE NEGATIVE    Comment: Performed at [facility], [address] [place], [place] [address]  SARS Coronavirus 2 (CEPHEID - Performed in [facility] hospital lab), Hosp Order     Status: None   Collection Time: 05/17/19 10:51 PM   Specimen: Nasopharyngeal Swab  Result Value Ref Range   SARS Coronavirus 2 NEGATIVE NEGATIVE    Comment: (NOTE) If result is NEGATIVE SARS-CoV-2 target nucleic acids are NOT DETECTED. The SARS-CoV-2 RNA is generally detectable in upper and lower  respiratory specimens during the acute phase of infection. The lowest  concentration of SARS-CoV-2 viral copies this assay can detect is 250  copies / mL. A negative result does not preclude SARS-CoV-2 infection  and should not be used as the sole basis for treatment or other  patient management decisions.  A negative result may occur with  improper specimen collection / handling, submission of specimen other  than nasopharyngeal swab, presence of viral mutation(s) within the  areas targeted by this assay, and inadequate number of viral copies  (<250 copies / mL). A negative result must be combined with clinical  observations, patient history, and epidemiological information. If result is  POSITIVE SARS-CoV-2 target nucleic acids are DETECTED. The SARS-CoV-2 RNA is generally detectable in upper and lower  respiratory specimens dur ing the acute phase of infection.  Positive  results are indicative of active infection with SARS-CoV-2.  Clinical  correlation with patient history and other diagnostic information is  necessary to determine patient infection status.  Positive results do  not rule out bacterial infection or co-infection with other viruses. If result is PRESUMPTIVE POSTIVE SARS-CoV-2 nucleic acids MAY BE PRESENT.   A presumptive positive result was obtained on the submitted specimen  and confirmed on repeat testing.  While 2019 novel coronavirus  (SARS-CoV-2) nucleic acids may be present in the submitted sample  additional confirmatory testing may be necessary for epidemiological  and / or clinical management purposes  to differentiate between  SARS-CoV-2 and other Sarbecovirus currently known to infect humans.  If clinically indicated additional testing with an alternate test  methodology [phone]) is advised. The SARS-CoV-2 RNA is generally  detectable in upper and lower respiratory sp ecimens during the acute  phase of infection. The expected result is Negative. Fact Sheet for Patients:  BoilerBrush.com.cyhttps://www.fda.gov/media/136312/download Fact Sheet for Healthcare Providers: https://pope.com/https://www.fda.gov/media/136313/download This test is not yet approved or cleared by the [place] FDA and has been authorized for detection and/or diagnosis of SARS-CoV-2 by FDA under an Emergency Use Authorization (EUA).  This EUA will remain in effect (meaning this test can be used) for the duration of the COVID-19 declaration under Section 564(b)(1) of the Act, 21 U.S.C. section 360bbb-3(b)(1), unless the authorization is terminated or revoked sooner. Performed at [facility], [address] [place], [place] [address]   Lactic acid, plasma     Status: None   Collection Time:  05/18/19 12:19 AM  Result Value Ref Range   Lactic Acid, Venous 0.7 0.5 - 1.9 mmol/L    Comment: Performed at [facility], [address] [place], [place] [address]  Type and screen [facility]     Status: None (Preliminary result)   Collection Time: 05/18/19 12:19 AM  Result Value Ref Range   ABO/RH(D) O POS    Antibody Screen NEG    Sample Expiration 05/21/2019,2359    Unit Number [id number]    Blood Component Type RED CELLS,LR    Unit division 00    Status of Unit ISSUED    Transfusion Status OK TO TRANSFUSE    Crossmatch Result      Compatible Performed at [facility], [address] [place], [place] [address]   Prepare RBC     Status: None   Collection Time: 05/18/19 12:19 AM  Result Value Ref Range   Order Confirmation      ORDER PROCESSED BY BLOOD BANK Performed at [facility], [address] [place], [place] [address]   ABO/Rh     Status: None   Collection Time: 05/18/19 12:19 AM  Result Value Ref Range   ABO/RH(D)      O POS Performed at [facility], [address] [place], [place] [address]   Basic metabolic panel     Status: Abnormal   Collection Time: 05/18/19  5:52 AM  Result Value Ref Range   Sodium 139 135 - 145 mmol/L    Comment: DELTA CHECK NOTED   Potassium 4.0 3.5 - 5.1 mmol/L   Chloride 105 98 - 111 mmol/L   CO2 25 22 - 32 mmol/L   Glucose, Bld 97 70 - 99 mg/dL   BUN 8 8 - 23 mg/dL   Creatinine, Ser 3.220.66 0.44 - 1.00 mg/dL   Calcium 8.2 (L) 8.9 - 10.3 mg/dL   GFR calc non Af Amer >60 >60 mL/min  GFR calc Af Amer >60 >60 mL/min   Anion gap 9 5 - 15    Comment: Performed at [facility] [address] [place], [place] [address]  CBC     Status: Abnormal   Collection Time: 05/18/19  5:52 AM  Result Value Ref Range   WBC 5.6 4.0 - 10.5 K/uL   RBC 3.08 (L) 3.87 - 5.11 MIL/uL   Hemoglobin 8.8 (L) 12.0 - 15.0 g/dL   HCT 27.0 (L) 36.0 - 46.0 %   MCV 87.7 80.0 - 100.0 fL   MCH 28.6 26.0 -  34.0 pg   MCHC 32.6 30.0 - 36.0 g/dL   RDW 13.1 11.5 - 15.5 %   Platelets 211 150 - 400 K/uL   nRBC 0.0 0.0 - 0.2 %    Comment: Performed at [facility], [place] [address], [place], [place] [address]    Dg Chest Port 1 View  Result Date: 05/17/2019 CLINICAL DATA:  Hypotension EXAM: PORTABLE CHEST 1 VIEW COMPARISON:  May 07, 2019 FINDINGS: The heart size is stable. There is no large pleural effusion. No pneumothorax. No focal infiltrate. The patient is status post total shoulder arthroplasty on the right. There is partially visualized orthopedic hardware involving the left humerus. There are old healed right-sided rib fractures. There may be some mild atelectasis at the left lung base. IMPRESSION: No active disease. Electronically Signed   By: Constance Holster M.D.   On: 05/17/2019 20:03   Dg Hip Unilat With Pelvis 2-3 Views Right  Result Date: 05/18/2019 CLINICAL DATA:  Pain EXAM: DG HIP (WITH OR WITHOUT PELVIS) 2-3V RIGHT COMPARISON:  May 14, 2019 intraoperative examination FINDINGS: Frontal pelvis as well as frontal and lateral right hip images obtained. There are total hip replacements bilaterally. Prostheses appear well seated bilaterally. There is subcutaneous air on the right, likely due to recent surgery. No acute fracture or dislocation. No erosive change. Postoperative change noted in the mid to lower lumbar spine. 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 IMPRESSION: Bilateral total hip replacements with recent postoperative change on the right. Prosthetic components appear well seated bilaterally. 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 No acute fracture or dislocation. Postoperative change noted in the lumbar spine. Electronically Signed   By: Lowella Grip III M.D.   On: 05/18/2019 10:17    ROS  ROS: I have reviewed the patient's review of systems thoroughly and there are no positive responses as relates to the HPI. Blood pressure 118/79, pulse  88, temperature 98 F (36.7 C), temperature source Oral, resp. rate 11, weight 77.5 kg, SpO2 95 %. Physical Exam Well-developed well-nourished patient in no acute distress. Alert and oriented x3 HEENT:within normal limits Cardiac: Regular rate and rhythm Pulmonary: Lungs clear to auscultation Abdomen: Soft and nontender.  Normal active bowel sounds  Musculoskeletal: Right leg: Mild soft tissue swelling.  No significant swelling outside of what I consider to be normal.  Calf is soft and nontender.  She can move the leg but has some pain with both flexion and extension as well as rotation.)  X-ray: 2 views of the right hip are taken interpreted today and show no significant abnormality. Assessment/Plan: 73 year old female status post right total hip replacement who had issues of hypotension and mental status changes most likely related to orthostasis.  She has had transfusion and improvement in her vital signs.  She is completely with it today and has pain which is controllable by pain medication.  Radiographically there are no abnormalities.  At this point I believe the patient is satisfactory for discharge if she clears physical therapy.  I think that the hydrocodone on a scheduled basis for 3-4 days is appropriate.  At that point I believe he can go back to a when necessary ministration.  She has our number and knows to call any time should issues arise.  Her follow-up will be as per her original instructions.  She knows to call and come sooner should any issues arise.  I am available at any time to discuss this case further.  Breanah Faddis Starling Manns 05/18/2019, 11:15 AM  [phone]

## 2019-05-19 LAB — TYPE AND SCREEN
ABO/RH(D): O POS
Antibody Screen: NEGATIVE
Unit division: 0

## 2019-05-19 LAB — BPAM RBC
Blood Product Expiration Date: 202008072359
ISSUE DATE / TIME: 202007110138
Unit Type and Rh: 5100

## 2019-05-19 LAB — URINE CULTURE: Culture: <10000 — AB

## 2019-05-22 LAB — CULTURE, BLOOD (ROUTINE X 2)
Culture: NO GROWTH
Culture: NO GROWTH
Special Requests: ADEQUATE

## 2019-07-30 ENCOUNTER — Other Ambulatory Visit: Payer: Self-pay | Admitting: Family Medicine

## 2019-07-30 DIAGNOSIS — N644 Mastodynia: Secondary | ICD-10-CM

## 2019-08-05 ENCOUNTER — Ambulatory Visit
Admission: RE | Admit: 2019-08-05 | Discharge: 2019-08-05 | Disposition: A | Discharge status: Home / Self Care | Payer: Medicare Other | Source: Ambulatory Visit | Attending: Family Medicine | Admitting: Family Medicine

## 2019-08-05 ENCOUNTER — Other Ambulatory Visit: Payer: Self-pay

## 2019-08-05 ENCOUNTER — Ambulatory Visit: Payer: Medicare Other

## 2019-08-05 DIAGNOSIS — N644 Mastodynia: Secondary | ICD-10-CM

## 2019-08-09 ENCOUNTER — Other Ambulatory Visit (HOSPITAL_COMMUNITY): Payer: Self-pay | Admitting: Orthopaedic Surgery

## 2019-08-09 ENCOUNTER — Other Ambulatory Visit: Payer: Self-pay | Admitting: Orthopaedic Surgery

## 2019-08-09 ENCOUNTER — Other Ambulatory Visit: Payer: Self-pay

## 2019-08-09 ENCOUNTER — Ambulatory Visit (HOSPITAL_COMMUNITY)
Admission: RE | Admit: 2019-08-09 | Discharge: 2019-08-09 | Disposition: A | Discharge status: Home / Self Care | Payer: Medicare Other | Source: Ambulatory Visit | Attending: Family Medicine | Admitting: Family Medicine

## 2019-08-09 ENCOUNTER — Ambulatory Visit (HOSPITAL_COMMUNITY)
Admission: RE | Admit: 2019-08-09 | Discharge: 2019-08-09 | Disposition: A | Discharge status: Home / Self Care | Payer: Medicare Other | Source: Ambulatory Visit | Attending: Orthopaedic Surgery | Admitting: Orthopaedic Surgery

## 2019-08-09 DIAGNOSIS — M7989 Other specified soft tissue disorders: Secondary | ICD-10-CM | POA: Diagnosis present

## 2019-08-09 DIAGNOSIS — M79604 Pain in right leg: Secondary | ICD-10-CM | POA: Diagnosis not present

## 2019-08-09 DIAGNOSIS — M79661 Pain in right lower leg: Secondary | ICD-10-CM

## 2019-08-09 NOTE — Progress Notes (Signed)
Right lower extremity venous duplex has been completed. Preliminary results can be found in CV Proc through chart review.  Results were given to [facility] at Dr. Jerald Kief office.  08/09/19 2:23 PM Carlos Levering RVT

## 2020-06-22 DIAGNOSIS — R7303 Prediabetes: Secondary | ICD-10-CM | POA: Insufficient documentation

## 2020-06-22 DIAGNOSIS — E782 Mixed hyperlipidemia: Secondary | ICD-10-CM | POA: Insufficient documentation

## 2020-06-22 DIAGNOSIS — F331 Major depressive disorder, recurrent, moderate: Secondary | ICD-10-CM | POA: Insufficient documentation

## 2020-09-01 DIAGNOSIS — R002 Palpitations: Secondary | ICD-10-CM | POA: Insufficient documentation

## 2020-09-01 DIAGNOSIS — R9431 Abnormal electrocardiogram [ECG] [EKG]: Secondary | ICD-10-CM | POA: Insufficient documentation

## 2020-09-01 DIAGNOSIS — R079 Chest pain, unspecified: Secondary | ICD-10-CM | POA: Insufficient documentation

## 2020-09-10 ENCOUNTER — Encounter: Payer: Self-pay | Admitting: Physician Assistant

## 2020-09-10 ENCOUNTER — Ambulatory Visit (INDEPENDENT_AMBULATORY_CARE_PROVIDER_SITE_OTHER): Payer: Medicare Other | Admitting: Physician Assistant

## 2020-09-10 VITALS — BP 134/100 | HR 96 | Ht 61.0 in | Wt 157.5 lb

## 2020-09-10 DIAGNOSIS — R1013 Epigastric pain: Secondary | ICD-10-CM

## 2020-09-10 DIAGNOSIS — K219 Gastro-esophageal reflux disease without esophagitis: Secondary | ICD-10-CM

## 2020-09-10 DIAGNOSIS — Z8719 Personal history of other diseases of the digestive system: Secondary | ICD-10-CM

## 2020-09-10 MED ORDER — OMEPRAZOLE 40 MG PO CPDR: 40.0000 mg | DELAYED_RELEASE_CAPSULE | Freq: Two times a day (BID) | ORAL | 5 refills | Status: DC

## 2020-09-10 NOTE — Progress Notes (Signed)
Chief Complaint: History of Barrett's esophagus, epigastric pressure and GERD  HPI:    Tanya Duncan is a 74 year old Caucasian female with a past medical history as listed below including fibromyalgia and previously Barrett's esophagus, assigned to Dr. Lavon Paganini, who was referred to me by Tracey Harries, MD for a complaint of history of Barrett's esophagus, epigastric pressure and GERD.      06/07/2018 patient seen in clinic by Dr. Lavon Paganini.  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  She is started on Entocort and no significant improvement diarrhea.  She was continued on budesonide 9 mg daily for 2 months.  Her history of Barrett's esophagus was discussed and it was recommended she have surveillance EGD.  Her repeat colonoscopy due in 2024 due to family history of colon cancer.    09/01/2020 patient saw cardiologist in regards to some chest pain.  Labs including a CBC, hepatic panel and CRP were normal.  There was an uncertain etiology for her chest pain and it was recommended that she have a nuclear stress test which is being scheduled.  There is also some discussion about starting her on either aspirin or Plavix.  Is recommend she discuss this with [place] due to her history of collagenous colitis, gastritis and Barrett's esophagus.  Was also noted she had a mild anemia possibly secondary to collagenous colitis and gastritis (though lab work showed a normal CBC that day).  Her iron studies were normal.    Today, the patient presents to clinic and tells me that she does not know anything about what the cardiologist said in regards to a blood thinner.  She is here for complaint of some epigastric pressure, she feels like she might have a "hiatal hernia", because she constantly feels like something is pushing up underneath her rib cage.  Along with this has been experiencing increase in reflux symptoms regardless of  Omeprazole 40 mg daily and Carafate 4 times daily and as needed Tums.  Tells me that she is also having breakthrough reflux symptoms regardless of these medicines over the past couple of months.    Denies fever, chills, weight loss or change in bowel habits.  Past Medical History:  Diagnosis Date  . Anxiety   . Arthritis   . Fibromyalgia   . Pneumonia 2005  . Stomach ulcer 1990    Past Surgical History:  Procedure Laterality Date  . ABDOMINAL HYSTERECTOMY    . LUMBAR FUSION  2015  . PYLOROPLASTY    . TOTAL HIP ARTHROPLASTY  2011  . TOTAL HIP ARTHROPLASTY Right 05/14/2019   Procedure: Right Anterior Hip Arthroplasty;  Surgeon: Marcene Corning, MD;  Location: [facility];  Service: Orthopedics;  Laterality: Right;  . TOTAL SHOULDER REPLACEMENT  2014    Current Outpatient Medications  Medication Sig Dispense Refill  . apixaban (ELIQUIS) 2.5 MG TABS tablet Take 1 tablet (2.5 mg total) by mouth every 12 (twelve) hours. 60 tablet 0  . Ascorbic Acid (VITAMIN C) 1000 MG tablet Take 2,000 mg by mouth daily.    Marland Kitchen b complex vitamins tablet Take 1 tablet by mouth daily.    Marland Kitchen escitalopram (LEXAPRO) 20 MG tablet Take 20 mg by mouth daily.    Marland Kitchen gabapentin (NEURONTIN) 300 MG capsule Take 300 mg by mouth 4 (four) times daily as needed (pain).     . LORazepam (ATIVAN) 0.5 MG tablet Take 0.5 mg by mouth 2 (two) times daily  as needed for anxiety.     . Melatonin 5 MG TABS Take 5 mg by mouth at bedtime.    . Multiple Vitamin (MULTIVITAMIN WITH MINERALS) TABS tablet Take 1 tablet by mouth daily.    . nortriptyline (PAMELOR) 25 MG capsule Take 50 mg by mouth at bedtime.     Marland Kitchen omeprazole (PRILOSEC) 40 MG capsule TAKE 1 CAPSULE BY MOUTH DAILY. (Patient taking differently: Take 40 mg by mouth at bedtime. ) 90 capsule 3  . psyllium (REGULOID) 0.52 g capsule Take 0.52 g by mouth daily.    Marland Kitchen zolpidem (AMBIEN) 10 MG tablet Take 10 mg by mouth at bedtime as needed for sleep.      No current facility-administered  medications for this visit.    Allergies as of 09/10/2020 - Review Complete 05/17/2019  Allergen Reaction Noted  . Indocin [indomethacin] Other (See Comments) 05/09/2018  . Earnestine Leys officinalis] Other (See Comments) 05/14/2019  . Sulfa antibiotics Nausea And Vomiting 05/09/2018  . Avelox [moxifloxacin hcl in nacl] Palpitations 05/09/2018    Family History  Problem Relation Age of Onset  . Colon cancer Father 63    Social History   Socioeconomic History  . Marital status: Divorced    Spouse name: Not on file  . Number of children: Not on file  . Years of education: Not on file  . Highest education level: Not on file  Occupational History  . Occupation: Retired  Tobacco Use  . Smoking status: Never Smoker  . Smokeless tobacco: Never Used  Vaping Use  . Vaping Use: Never used  Substance and Sexual Activity  . Alcohol use: Yes    Comment: occasionally  . Drug use: Never  . Sexual activity: Not on file  Other Topics Concern  . Not on file  Social History Narrative  . Not on file   Social Determinants of Health   Financial Resource Strain:   . Difficulty of Paying Living Expenses: Not on file  Food Insecurity:   . Worried About Programme researcher, broadcasting/film/video in the Last Year: Not on file  . Ran Out of Food in the Last Year: Not on file  Transportation Needs:   . Lack of Transportation (Medical): Not on file  . Lack of Transportation (Non-Medical): Not on file  Physical Activity:   . Days of Exercise per Week: Not on file  . Minutes of Exercise per Session: Not on file  Stress:   . Feeling of Stress : Not on file  Social Connections:   . Frequency of Communication with Friends and Family: Not on file  . Frequency of Social Gatherings with Friends and Family: Not on file  . Attends Religious Services: Not on file  . Active Member of Clubs or Organizations: Not on file  . Attends Banker Meetings: Not on file  . Marital Status: Not on file  Intimate Partner  Violence:   . Fear of Current or Ex-Partner: Not on file  . Emotionally Abused: Not on file  . Physically Abused: Not on file  . Sexually Abused: Not on file    Review of Systems:    Constitutional: No weight loss, fever or chills Cardiovascular: No chest pain Respiratory: No SOB  Gastrointestinal: See HPI and otherwise negative   Physical Exam:  Vital signs: BP (!) 134/100 (BP Location: Left Arm, Patient Position: Sitting, Cuff Size: Normal)   Pulse 96   Ht 5\' 1"  (1.549 m) Comment: height measured without shoes  Wt 157  lb 8 oz (71.4 kg)   BMI 29.76 kg/m   Constitutional:   Pleasant elderly Caucasian female appears to be in NAD, Well developed, Well nourished, alert and cooperative Respiratory: Respirations even and unlabored. Lungs clear to auscultation bilaterally.   No wheezes, crackles, or rhonchi.  Cardiovascular: Normal S1, S2. No MRG. Regular rate and rhythm. No peripheral edema, cyanosis or pallor.  Gastrointestinal:  Soft, nondistended, mild epigastric TTP no rebound or guarding. Normal bowel sounds. No appreciable masses or hepatomegaly. Rectal:  Not performed.  Psychiatric: Demonstrates good judgement and reason without abnormal affect or behaviors.  See HPI for recent labs.  Assessment: 1.  GERD: Breakthrough symptoms on Omeprazole 40 mg daily and Carafate 4 times daily 2.  Epigastric pressure: Over the past month or so; consider gastritis versus other 3.  History of Barrett's esophagus: Overdue for surveillance  Plan: 1.  From a GI standpoint I do not see a reason with the patient could not be on blood thinners, though she is about to undergo work-up with an EGD, it may be best to wait until after EGD to start her on anything.  Will let the cardiologist know our recommendations. 2.  Today patient requested a switch from Dr. Lavon Paganini to Dr. Russella Dar.  Pending results of this she will be scheduled for an EGD in the [facility].  Did discuss risks, benefits, limitations and  alternatives and patient agrees to proceed.  She has had her Covid vaccine. 3.  Increase Omeprazole to 40 mg twice daily, 30-60 minutes before eating breakfast and dinner #60 with 5 refills. 4.  Can continue Carafate 4 times daily. 5.  Patient to follow in clinic per recommendations from the doctor after time of procedure.  Hyacinth Meeker, PA-C  Gastroenterology 09/10/2020, 11:18 AM  Cc: Tracey Harries, MD

## 2020-09-10 NOTE — Progress Notes (Signed)
Reviewed and agree with management plan.  Essance Gatti T. Mardee Clune, MD FACG [facility]  

## 2020-09-10 NOTE — Patient Instructions (Addendum)
If you are age 72 or older, your body mass index should be between 23-30. Your Body mass index is 29.76 kg/m. If this is out of the aforementioned range listed, please consider follow up with your Primary Care Provider.  If you are age 42 or younger, your body mass index should be between 19-25. Your Body mass index is 29.76 kg/m. If this is out of the aformentioned range listed, please consider follow up with your Primary Care Provider.   We have sent the following medications to your pharmacy for you to pick up at your convenience: Increase Omeprazole to 40 mg twice daily 30-60 minutes before breakfast and dinner.   Continue Carafate try crushing and making a slurry.   Will call back to schedule.

## 2020-09-14 ENCOUNTER — Telehealth: Payer: Self-pay | Admitting: Physician Assistant

## 2020-09-14 DIAGNOSIS — R1013 Epigastric pain: Secondary | ICD-10-CM

## 2020-09-14 DIAGNOSIS — Z8719 Personal history of other diseases of the digestive system: Secondary | ICD-10-CM

## 2020-09-14 DIAGNOSIS — K219 Gastro-esophageal reflux disease without esophagitis: Secondary | ICD-10-CM

## 2020-09-14 NOTE — Telephone Encounter (Signed)
Jennifer please advise?  

## 2020-09-14 NOTE — Telephone Encounter (Signed)
Pt is requesting a call back from a nurse to discuss the EGD she is to have scheduled, pt has further questions.

## 2020-09-14 NOTE — Telephone Encounter (Signed)
Appointment scheduled for Friday 09/18/20 @ 9:30 am with Dr. Russella Dar. Patient will stop by tomorrow to sign consent and pick up instructions.

## 2020-09-14 NOTE — Telephone Encounter (Signed)
Patient requested switch. I'm not sure if either one approved. Will need to ask [facility]. I have not been told to call patient to schedule anything.

## 2020-09-14 NOTE — Telephone Encounter (Signed)
Herbert Seta do you know if the switch was ok's by Dr Lavon Paganini and Russella Dar.  I am not sure who this pt is suppose to be scheduled with.

## 2020-09-14 NOTE — Telephone Encounter (Signed)
Patient was accepted by Dr. Russella Dar.  Thanks.

## 2020-09-15 ENCOUNTER — Other Ambulatory Visit: Payer: Self-pay | Admitting: Orthopedic Surgery

## 2020-09-15 DIAGNOSIS — S329XXA Fracture of unspecified parts of lumbosacral spine and pelvis, initial encounter for closed fracture: Secondary | ICD-10-CM

## 2020-09-15 HISTORY — DX: Fracture of unspecified parts of lumbosacral spine and pelvis, initial encounter for closed fracture: S32.9XXA

## 2020-09-15 NOTE — Telephone Encounter (Signed)
Patient picked up instructions and signed consent.

## 2020-09-16 ENCOUNTER — Encounter (HOSPITAL_BASED_OUTPATIENT_CLINIC_OR_DEPARTMENT_OTHER): Payer: Self-pay | Admitting: Orthopedic Surgery

## 2020-09-16 ENCOUNTER — Telehealth: Payer: Self-pay

## 2020-09-16 NOTE — Telephone Encounter (Signed)
While prepping chart for Friday 09-18-20 for Dr. Russella Dar.  I read pt's office visit with Marla Roe, APP I saw a mention of cardiology placing pt on Plavix.  Then on pt's medication list was Eliquis.  There was no clearance in the computer from cardiology to take pt off of a blood thinner either.  I called pt and asked if she was taking Eliquis at this timew.  Pt said she took Eliquis for a short period after her hip replacement to prevent blood clots and it was discontinued.  I asked if she was taking Plavix at this time.  Pt said she was not taking Plavix and did not discuss taking Plavix when she was at cardiology visit.  So pt denies taking any blood thinner at this time.

## 2020-09-16 NOTE — Progress Notes (Signed)
Chart reviewed with Dr Hyacinth Meeker and due to patient not having recommended cardiac tests done as ordered by Dr Rexanne Mano on 09-01-20 appointment, she will need to be done at [facility]. Tatiana at Dr Carollee Massed office made aware.

## 2020-09-18 ENCOUNTER — Encounter: Payer: Self-pay | Admitting: Gastroenterology

## 2020-09-18 ENCOUNTER — Encounter (HOSPITAL_COMMUNITY): Payer: Self-pay | Admitting: Orthopedic Surgery

## 2020-09-18 ENCOUNTER — Telehealth: Payer: Self-pay | Admitting: Gastroenterology

## 2020-09-18 ENCOUNTER — Ambulatory Visit (AMBULATORY_SURGERY_CENTER): Payer: Medicare Other | Admitting: Gastroenterology

## 2020-09-18 ENCOUNTER — Other Ambulatory Visit: Payer: Self-pay

## 2020-09-18 VITALS — BP 111/74 | HR 92 | Temp 98.4°F | Resp 11 | Ht 61.0 in | Wt 157.0 lb

## 2020-09-18 DIAGNOSIS — K2970 Gastritis, unspecified, without bleeding: Secondary | ICD-10-CM

## 2020-09-18 DIAGNOSIS — K219 Gastro-esophageal reflux disease without esophagitis: Secondary | ICD-10-CM

## 2020-09-18 DIAGNOSIS — K229 Disease of esophagus, unspecified: Secondary | ICD-10-CM

## 2020-09-18 DIAGNOSIS — K449 Diaphragmatic hernia without obstruction or gangrene: Secondary | ICD-10-CM | POA: Diagnosis not present

## 2020-09-18 DIAGNOSIS — K2289 Other specified disease of esophagus: Secondary | ICD-10-CM

## 2020-09-18 DIAGNOSIS — K3189 Other diseases of stomach and duodenum: Secondary | ICD-10-CM | POA: Diagnosis not present

## 2020-09-18 DIAGNOSIS — K2100 Gastro-esophageal reflux disease with esophagitis, without bleeding: Secondary | ICD-10-CM | POA: Diagnosis not present

## 2020-09-18 DIAGNOSIS — K2950 Unspecified chronic gastritis without bleeding: Secondary | ICD-10-CM | POA: Diagnosis not present

## 2020-09-18 MED ORDER — SODIUM CHLORIDE 0.9 % IV SOLN: 500.0000 mL | INTRAVENOUS | Status: DC

## 2020-09-18 MED ORDER — PANTOPRAZOLE SODIUM 40 MG PO TBEC: 40.0000 mg | DELAYED_RELEASE_TABLET | Freq: Two times a day (BID) | ORAL | 3 refills | Status: DC

## 2020-09-18 NOTE — Telephone Encounter (Signed)
Patient called this AM at 715. She has an upcoming EGD later this morning. Has had a recent fall this week and has a stable pelvic fracture that she will not be having any intervention performed and she has a finger fracture that will require treatment with surgery next week. She can lay on her left side but may need a pillow if the EGD is longer than anticipated. May be able to do a supine EGD but will defer to how she is doing to Dr. Russella Dar and the Anesthesia team. I do not see a contraindication to coming in and she can discuss how she is feeling at time of being in the [facility] with the team. I will forward this to Dr. Russella Dar and [facility] Charge Team.  Corliss Parish, MD [facility]oscopy Office # [phone]

## 2020-09-18 NOTE — Progress Notes (Signed)
PT taken to PACU. Monitors in place. VSS. Report given to RN. 

## 2020-09-18 NOTE — Patient Instructions (Signed)
Handouts given for Gastritis and Hiatal Hernia.  Await pathology results.  Stop Omeprazole and start Pantoprazole.  Office will call to schedule gastric emptying study.  YOU HAD AN ENDOSCOPIC PROCEDURE TODAY AT THE [facility]:   Refer to the procedure report that was given to you for any specific questions about what was found during the examination.  If the procedure report does not answer your questions, please call your gastroenterologist to clarify.  If you requested that your care partner not be given the details of your procedure findings, then the procedure report has been included in a sealed envelope for you to review at your convenience later.  YOU SHOULD EXPECT: Some feelings of bloating in the abdomen. Passage of more gas than usual.  Walking can help get rid of the air that was put into your GI tract during the procedure and reduce the bloating. If you had a lower endoscopy (such as a colonoscopy or flexible sigmoidoscopy) you may notice spotting of blood in your stool or on the toilet paper. If you underwent a bowel prep for your procedure, you may not have a normal bowel movement for a few days.  Please Note:  You might notice some irritation and congestion in your nose or some drainage.  This is from the oxygen used during your procedure.  There is no need for concern and it should clear up in a day or so.  SYMPTOMS TO REPORT IMMEDIATELY:   Following upper endoscopy (EGD)  Vomiting of blood or coffee ground material  New chest pain or pain under the shoulder blades  Painful or persistently difficult swallowing  New shortness of breath  Fever of 100F or higher  Black, tarry-looking stools  For urgent or emergent issues, a gastroenterologist can be reached at any hour by calling [phone]. Do not use MyChart messaging for urgent concerns.    DIET:  We do recommend a small meal at first, but then you may proceed to your regular diet.  Drink plenty of  fluids but you should avoid alcoholic beverages for 24 hours.  ACTIVITY:  You should plan to take it easy for the rest of today and you should NOT DRIVE or use heavy machinery until tomorrow (because of the sedation medicines used during the test).    FOLLOW UP: Our staff will call the number listed on your records 48-72 hours following your procedure to check on you and address any questions or concerns that you may have regarding the information given to you following your procedure. If we do not reach you, we will leave a message.  We will attempt to reach you two times.  During this call, we will ask if you have developed any symptoms of COVID 19. If you develop any symptoms (ie: fever, flu-like symptoms, shortness of breath, cough etc.) before then, please call [phone].  If you test positive for Covid 19 in the 2 weeks post procedure, please call and report this information to [place].    If any biopsies were taken you will be contacted by phone or by letter within the next 1-3 weeks.  Please call us at [phone] if you have not heard about the biopsies in 3 weeks.    SIGNATURES/CONFIDENTIALITY: You and/or your care partner have signed paperwork which will be entered into your electronic medical record.  These signatures attest to the fact that that the information above on your After Visit Summary has been reviewed and is understood.  Full responsibility  of the confidentiality of this discharge information lies with you and/or your care-partner.

## 2020-09-18 NOTE — Progress Notes (Signed)
PCP - Dr Tracey Harries Cardiologist - Dr Naaman Plummer - [facility]  Chest x-ray - n/a EKG - 09/01/20 CE-requested Stress Test - to be scheduled ECHO - n/a Cardiac Cath - n/a  Anesthesia review: Yes - See Note dated 09/16/20 in EPIC by Dianna Rossetti, RN   STOP now taking any Aspirin (unless otherwise instructed by your surgeon), Aleve, Naproxen, Ibuprofen, Motrin, Advil, Goody's, BC's, all herbal medications, fish oil, and all vitamins.   Coronavirus Screening Covid test scheduled on 09/19/20 Do you have any of the following symptoms:  Cough yes/no: No Fever (>100.93F)  yes/no: No Runny nose yes/no: No Sore throat yes/no: No Difficulty breathing/shortness of breath  yes/no: No  Have you traveled in the last 14 days and where? yes/no: No  Patient verbalized understanding of instructions that were given via phone.

## 2020-09-18 NOTE — Progress Notes (Signed)
Called to room to assist during endoscopic procedure.  Patient ID and intended procedure confirmed with present staff. Received instructions for my participation in the procedure from the performing physician.  

## 2020-09-18 NOTE — Telephone Encounter (Signed)
Agree with GM. OK to proceed with EGD today.

## 2020-09-18 NOTE — Op Note (Signed)
[facility] Patient Name: Tanya Duncan Procedure Date: 09/18/2020 9:57 AM MRN: [id number] Endoscopist: Meryl Dare , MD Age: 74 Referring MD:  Date of Birth: 26-Jun-1946 Gender: Female Account #: [id number] Procedure:                Upper GI endoscopy Indications:              Dyspepsia, Gastroesophageal reflux disease, History                            of Barrett's Medicines:                Monitored Anesthesia Care Procedure:                Pre-Anesthesia Assessment:                           - Prior to the procedure, a History and Physical                            was performed, and patient medications and                            allergies were reviewed. The patient's tolerance of                            previous anesthesia was also reviewed. The risks                            and benefits of the procedure and the sedation                            options and risks were discussed with the patient.                            All questions were answered, and informed consent                            was obtained. Prior Anticoagulants: The patient has                            taken no previous anticoagulant or antiplatelet                            agents. ASA Grade Assessment: II - A patient with                            mild systemic disease. After reviewing the risks                            and benefits, the patient was deemed in                            satisfactory condition to undergo the procedure.  After obtaining informed consent, the endoscope was                            passed under direct vision. Throughout the                            procedure, the patient's blood pressure, pulse, and                            oxygen saturations were monitored continuously. The                            Endoscope was introduced through the mouth, and                            advanced to the second part of duodenum.  The upper                            GI endoscopy was accomplished without difficulty.                            The patient tolerated the procedure well. Scope In: Scope Out: Findings:                 The Z-line was variable and was found 37 cm from                            the incisors. Biopsies were taken with a cold                            forceps for histology.                           LA Grade A (one or more mucosal breaks less than 5                            mm, not extending between tops of 2 mucosal folds)                            esophagitis with no bleeding was found in the                            distal esophagus.                           The exam of the esophagus was otherwise normal.                           A small hiatal hernia was present.                           Localized moderate inflammation characterized by  erythema and granularity was found in the gastric                            body and in the gastric antrum. Biopsies were taken                            with a cold forceps for histology.                           A medium amount of food (residue) was found in the                            gastric fundus and in the gastric body.                           The exam of the stomach was otherwise normal.                           The duodenal bulb and second portion of the                            duodenum were normal. Complications:            No immediate complications. Estimated blood loss:                            None. Impression:               - Z-line variable, 37 cm from the incisors.                            Biopsied.                           - LA Grade A esophagitis.                           - Small hiatal hernia.                           - Gastritis. Biopsied.                           - A medium amount of food (residue) in the stomach.                           - Normal duodenal bulb and second  portion of the                            duodenum. Recommendation:           - Patient has a contact number available for                            emergencies. The signs and symptoms of potential  delayed complications were discussed with the                            patient. Return to normal activities tomorrow.                            Written discharge instructions were provided to the                            patient.                           - Resume previous diet.                           - Follow antireflux measures.                           - Continue present medications.                           - Discontinue omeprazole.                           - Pantoprazole 40 mg po bid, 1 year of refills.                           - Schedule GES.                           - Await pathology results.                           - GI office visit in 6-8 weeks. Meryl Dare, MD 09/18/2020 10:23:59 AM This report has been signed electronically.

## 2020-09-19 ENCOUNTER — Other Ambulatory Visit (HOSPITAL_COMMUNITY)
Admission: RE | Admit: 2020-09-19 | Discharge: 2020-09-19 | Disposition: A | Discharge status: Home / Self Care | Payer: Medicare Other | Source: Ambulatory Visit | Attending: Orthopedic Surgery | Admitting: Orthopedic Surgery

## 2020-09-19 DIAGNOSIS — Z01812 Encounter for preprocedural laboratory examination: Secondary | ICD-10-CM | POA: Insufficient documentation

## 2020-09-19 DIAGNOSIS — Z20822 Contact with and (suspected) exposure to covid-19: Secondary | ICD-10-CM | POA: Insufficient documentation

## 2020-09-19 LAB — SARS CORONAVIRUS 2 (TAT 6-24 HRS): SARS Coronavirus 2: NEGATIVE

## 2020-09-19 NOTE — H&P (Signed)
Tanya Duncan is an 74 y.o. female.   CC / Reason for Visit: Right hand and pelvis injury HPI: This patient is a 75 year old RHD retired female who presents for evaluation of problems involving the right hand in the anterior pelvis.  She was pulled down by a dog today.  It happened shortly before presentation to the office.  She has pain and swelling and bruising of the right middle finger, with a gold wedding band encircling it.  In addition there is pain in the anterior pubic symphysis slightly to the right.  Past Medical History:  Diagnosis Date  . Anxiety   . Arthritis   . Blood transfusion without reported diagnosis 05/2019   with hip surgery  . Fibromyalgia   . Fractured pelvis (HCC)    right  . GERD (gastroesophageal reflux disease)   . GI bleed    x2  . Hyperlipidemia   . Pneumonia 2005  . Stomach ulcer 1990    Past Surgical History:  Procedure Laterality Date  . ABDOMINAL HYSTERECTOMY    . JOINT REPLACEMENT    . LUMBAR FUSION  2015  . PYLOROPLASTY    . TOTAL HIP ARTHROPLASTY  2011  . TOTAL HIP ARTHROPLASTY Right 05/14/2019   Procedure: Right Anterior Hip Arthroplasty;  Surgeon: Marcene Corning, MD;  Location: [facility];  Service: Orthopedics;  Laterality: Right;  . TOTAL SHOULDER REPLACEMENT  2014    Family History  Problem Relation Age of Onset  . Colon cancer Father 95   Social History:  reports that she has never smoked. She has never used smokeless tobacco. She reports current alcohol use. She reports that she does not use drugs.  Allergies:  Allergies  Allergen Reactions  . Atorvastatin Diarrhea  . Ibuprofen     Has had GI bleeds x2 in past  . Indocin [Indomethacin] Other (See Comments)    Dizziness  . Earnestine Leys Officinalis] Other (See Comments)    Heart races   . Sulfa Antibiotics Nausea And Vomiting  . Avelox [Moxifloxacin Hcl In Nacl] Palpitations    No medications prior to admission.    No results found for this or any previous visit (from the  past 48 hour(s)). No results found.  Review of Systems  All other systems reviewed and are negative.   Height 5\' 1"  (1.549 m), weight 71.2 kg. Physical Exam  Constitutional:  WD, WN, NAD HEENT:  NCAT, EOMI Neuro/Psych:  Alert & oriented to person, place, and time; appropriate mood & affect Lymphatic: No generalized UE edema or lymphadenopathy Extremities / MSK:  Both UE are normal with respect to appearance, ranges of motion, joint stability, muscle strength/tone, sensation, & perfusion except as otherwise noted:  Right long finger is swollen and bruised in the region of P1.  There is a 3 mm gold band ring encircling it, already quite tight due to swelling.  The digit is a little cool consequently.  She has been placing ice upon it.  After removal of the ring, the digit had a crossover deformity with the ring and was unstable through P1.  TTP right anterior pelvis adjacent to the symphysis.  No tenderness to palpation at the posterior pelvis.  Antalgic gait favoring the right.  Labs / Xrays:  3 views of the right long finger ordered and obtained today reveals a comminuted proximal phalangeal shaft fracture which is angulated and displaced.  2 views of the right hip ordered and obtained today reveals intact hip prosthesis and cup, no periprosthetic fracture  identified, there is a slight cortical irregularity on the inferior pubic symphysis on the right consistent with fracture  Assessment: 1.  Displaced right long finger P1 fracture 2.  Probable right anterior pelvic fracture  Plan:  I discussed these findings with her.  She will he has a walker at home and will use that for her presumed pelvic injury.  After digital block was performed by me, the ring was removed with a ring cutter after receiving her consent for doing such.  The ring was given to her after it was divided.  In the process, the cutting wheel of the ring abraded the skin distal to the ring and a Band-Aid was applied.  The  long and ring fingers were buddy taped and we discussed the indications for surgical treatment.  She has an EGD scheduled for Friday, and she attempted to move it but was unsuccessful.  We will therefore plan to proceed with surgery on Monday the 15th.  Since she cannot use NSAIDs, she will use Tylenol and we provided a few oxycodone to hopefully last between now and then.  The details of the operative procedure were discussed with the patient.  Questions were invited and answered.  In addition to the goal of the procedure, the risks of the procedure to include but not limited to bleeding; infection; damage to the nerves or blood vessels that could result in bleeding, numbness, weakness, chronic pain, and the need for additional procedures; stiffness; the need for revision surgery; and anesthetic risks were reviewed.  No specific outcome was guaranteed or implied.  Informed consent was obtained.  [name], MD 09/19/2020, 12:19 PM

## 2020-09-21 ENCOUNTER — Ambulatory Visit (HOSPITAL_COMMUNITY): Payer: Medicare Other | Admitting: Physician Assistant

## 2020-09-21 ENCOUNTER — Encounter (HOSPITAL_COMMUNITY): Payer: Self-pay | Admitting: Orthopedic Surgery

## 2020-09-21 ENCOUNTER — Ambulatory Visit (HOSPITAL_COMMUNITY): Payer: Medicare Other

## 2020-09-21 ENCOUNTER — Ambulatory Visit (HOSPITAL_COMMUNITY)
Admission: RE | Admit: 2020-09-21 | Discharge: 2020-09-21 | Disposition: A | Discharge status: Home / Self Care | Payer: Medicare Other | Source: Ambulatory Visit | Attending: Orthopedic Surgery | Admitting: Orthopedic Surgery

## 2020-09-21 ENCOUNTER — Encounter (HOSPITAL_COMMUNITY)
Admission: RE | Disposition: A | Discharge status: Home / Self Care | Payer: Self-pay | Source: Ambulatory Visit | Attending: Orthopedic Surgery

## 2020-09-21 ENCOUNTER — Telehealth: Payer: Self-pay

## 2020-09-21 DIAGNOSIS — S62621A Displaced fracture of medial phalanx of left index finger, initial encounter for closed fracture: Secondary | ICD-10-CM | POA: Insufficient documentation

## 2020-09-21 DIAGNOSIS — R1013 Epigastric pain: Secondary | ICD-10-CM

## 2020-09-21 DIAGNOSIS — W541XXA Struck by dog, initial encounter: Secondary | ICD-10-CM | POA: Diagnosis not present

## 2020-09-21 DIAGNOSIS — Z96619 Presence of unspecified artificial shoulder joint: Secondary | ICD-10-CM | POA: Insufficient documentation

## 2020-09-21 DIAGNOSIS — Z96641 Presence of right artificial hip joint: Secondary | ICD-10-CM | POA: Insufficient documentation

## 2020-09-21 DIAGNOSIS — K219 Gastro-esophageal reflux disease without esophagitis: Secondary | ICD-10-CM

## 2020-09-21 DIAGNOSIS — Z419 Encounter for procedure for purposes other than remedying health state, unspecified: Secondary | ICD-10-CM

## 2020-09-21 DIAGNOSIS — S62612A Displaced fracture of proximal phalanx of right middle finger, initial encounter for closed fracture: Secondary | ICD-10-CM | POA: Diagnosis present

## 2020-09-21 HISTORY — PX: OPEN REDUCTION INTERNAL FIXATION (ORIF) PROXIMAL PHALANX: SHX6235

## 2020-09-21 HISTORY — DX: Gastrointestinal hemorrhage, unspecified: K92.2

## 2020-09-21 HISTORY — DX: Gastro-esophageal reflux disease without esophagitis: K21.9

## 2020-09-21 LAB — CBC
HCT: 36.1 % (ref 36.0–46.0)
Hemoglobin: 11.5 g/dL — ABNORMAL LOW (ref 12.0–15.0)
MCH: 29.2 pg (ref 26.0–34.0)
MCHC: 31.9 g/dL (ref 30.0–36.0)
MCV: 91.6 fL (ref 80.0–100.0)
Platelets: 203 10*3/uL (ref 150–400)
RBC: 3.94 MIL/uL (ref 3.87–5.11)
RDW: 13.1 % (ref 11.5–15.5)
WBC: 6 10*3/uL (ref 4.0–10.5)
nRBC: 0 % (ref 0.0–0.2)

## 2020-09-21 SURGERY — OPEN REDUCTION INTERNAL FIXATION (ORIF) PROXIMAL PHALANX
Anesthesia: Monitor Anesthesia Care | Site: Finger | Laterality: Right

## 2020-09-21 MED ORDER — ONDANSETRON HCL 4 MG/2ML IJ SOLN
INTRAMUSCULAR | Status: AC
  Filled 2020-09-21: qty 2

## 2020-09-21 MED ORDER — CHLORHEXIDINE GLUCONATE 0.12 % MT SOLN: 15.0000 mL | Freq: Once | OROMUCOSAL | Status: AC

## 2020-09-21 MED ORDER — ONDANSETRON HCL 4 MG/2ML IJ SOLN
INTRAMUSCULAR | Status: DC | PRN
  Administered 2020-09-21: 4 mg via INTRAVENOUS

## 2020-09-21 MED ORDER — OXYCODONE HCL 5 MG PO CAPS: 5.0000 mg | ORAL_CAPSULE | Freq: Four times a day (QID) | ORAL | 0 refills | Status: DC | PRN

## 2020-09-21 MED ORDER — CHLORHEXIDINE GLUCONATE 0.12 % MT SOLN
OROMUCOSAL | Status: AC
  Administered 2020-09-21: 15 mL via OROMUCOSAL
  Filled 2020-09-21: qty 15

## 2020-09-21 MED ORDER — BUPIVACAINE-EPINEPHRINE (PF) 0.25% -1:200000 IJ SOLN
INTRAMUSCULAR | Status: DC | PRN
  Administered 2020-09-21: 4 mL

## 2020-09-21 MED ORDER — MIDAZOLAM HCL 2 MG/2ML IJ SOLN
INTRAMUSCULAR | Status: AC
  Filled 2020-09-21: qty 2

## 2020-09-21 MED ORDER — FENTANYL CITRATE (PF) 100 MCG/2ML IJ SOLN
INTRAMUSCULAR | Status: DC | PRN
  Administered 2020-09-21: 50 ug via INTRAVENOUS

## 2020-09-21 MED ORDER — FENTANYL CITRATE (PF) 250 MCG/5ML IJ SOLN
INTRAMUSCULAR | Status: AC
  Filled 2020-09-21: qty 5

## 2020-09-21 MED ORDER — PHENYLEPHRINE 40 MCG/ML (10ML) SYRINGE FOR IV PUSH (FOR BLOOD PRESSURE SUPPORT)
PREFILLED_SYRINGE | INTRAVENOUS | Status: AC
  Filled 2020-09-21: qty 10

## 2020-09-21 MED ORDER — LIDOCAINE HCL (PF) 1 % IJ SOLN
INTRAMUSCULAR | Status: AC
  Filled 2020-09-21: qty 30

## 2020-09-21 MED ORDER — ORAL CARE MOUTH RINSE: 15.0000 mL | Freq: Once | OROMUCOSAL | Status: AC

## 2020-09-21 MED ORDER — CEFAZOLIN SODIUM-DEXTROSE 2-4 GM/100ML-% IV SOLN
2.0000 g | INTRAVENOUS | Status: AC
  Administered 2020-09-21: 2 g via INTRAVENOUS
  Filled 2020-09-21: qty 100

## 2020-09-21 MED ORDER — MINERAL OIL LIGHT OIL
TOPICAL_OIL | Status: AC
  Administered 2020-09-21: 1 via TOPICAL
  Filled 2020-09-21: qty 10

## 2020-09-21 MED ORDER — BUPIVACAINE-EPINEPHRINE (PF) 0.25% -1:200000 IJ SOLN
INTRAMUSCULAR | Status: AC
  Filled 2020-09-21: qty 30

## 2020-09-21 MED ORDER — 0.9 % SODIUM CHLORIDE (POUR BTL) OPTIME
TOPICAL | Status: DC | PRN
  Administered 2020-09-21: 1000 mL

## 2020-09-21 MED ORDER — DEXAMETHASONE SODIUM PHOSPHATE 10 MG/ML IJ SOLN
INTRAMUSCULAR | Status: DC | PRN
  Administered 2020-09-21: 5 mg via INTRAVENOUS

## 2020-09-21 MED ORDER — PHENYLEPHRINE 40 MCG/ML (10ML) SYRINGE FOR IV PUSH (FOR BLOOD PRESSURE SUPPORT)
PREFILLED_SYRINGE | INTRAVENOUS | Status: DC | PRN
  Administered 2020-09-21 (×2): 120 ug via INTRAVENOUS

## 2020-09-21 MED ORDER — PROPOFOL 500 MG/50ML IV EMUL
INTRAVENOUS | Status: DC | PRN
  Administered 2020-09-21: 150 ug/kg/min via INTRAVENOUS

## 2020-09-21 MED ORDER — ONDANSETRON HCL 4 MG/2ML IJ SOLN: 4.0000 mg | Freq: Once | INTRAMUSCULAR | Status: DC | PRN

## 2020-09-21 MED ORDER — FENTANYL CITRATE (PF) 100 MCG/2ML IJ SOLN: 25.0000 ug | INTRAMUSCULAR | Status: DC | PRN

## 2020-09-21 MED ORDER — PROPOFOL 10 MG/ML IV BOLUS
INTRAVENOUS | Status: AC
  Filled 2020-09-21: qty 20

## 2020-09-21 MED ORDER — OXYCODONE HCL 5 MG/5ML PO SOLN: 5.0000 mg | Freq: Once | ORAL | Status: DC | PRN

## 2020-09-21 MED ORDER — LACTATED RINGERS IV SOLN: INTRAVENOUS | Status: DC

## 2020-09-21 MED ORDER — MIDAZOLAM HCL 2 MG/2ML IJ SOLN
INTRAMUSCULAR | Status: DC | PRN
  Administered 2020-09-21: 1 mg via INTRAVENOUS

## 2020-09-21 MED ORDER — DEXAMETHASONE SODIUM PHOSPHATE 10 MG/ML IJ SOLN
INTRAMUSCULAR | Status: AC
  Filled 2020-09-21: qty 1

## 2020-09-21 MED ORDER — PROPOFOL 1000 MG/100ML IV EMUL
INTRAVENOUS | Status: AC
  Filled 2020-09-21: qty 100

## 2020-09-21 MED ORDER — OXYCODONE HCL 5 MG PO TABS: 5.0000 mg | ORAL_TABLET | Freq: Once | ORAL | Status: DC | PRN

## 2020-09-21 MED ORDER — LIDOCAINE HCL (PF) 1 % IJ SOLN
INTRAMUSCULAR | Status: DC | PRN
  Administered 2020-09-21: 4 mL

## 2020-09-21 SURGICAL SUPPLY — 51 items
BIT DRILL .045X (BIT) ×1 IMPLANT
BIT DRILL 1.1 (BIT) ×2
BIT DRL .045X (BIT) ×1
BNDG COHESIVE 4X5 TAN STRL (GAUZE/BANDAGES/DRESSINGS) ×3 IMPLANT
BNDG ESMARK 4X9 LF (GAUZE/BANDAGES/DRESSINGS) ×3 IMPLANT
BNDG GAUZE ELAST 4 BULKY (GAUZE/BANDAGES/DRESSINGS) ×3 IMPLANT
CANISTER SUCT 3000ML PPV (MISCELLANEOUS) ×3 IMPLANT
CHLORAPREP W/TINT 26 (MISCELLANEOUS) ×3 IMPLANT
CORD BIPOLAR FORCEPS 12FT (ELECTRODE) ×3 IMPLANT
CUFF TOURN SGL QUICK 18X4 (TOURNIQUET CUFF) ×3 IMPLANT
DRAPE C-ARM 42X72 X-RAY (DRAPES) ×3 IMPLANT
DRAPE SURG 17X23 STRL (DRAPES) ×3 IMPLANT
DRIVER SHAFT MINI HEX F/1/1.4 (ORTHOPEDIC DISPOSABLE SUPPLIES) ×3 IMPLANT
DRSG EMULSION OIL 3X3 NADH (GAUZE/BANDAGES/DRESSINGS) ×3 IMPLANT
GAUZE SPONGE 4X4 12PLY STRL (GAUZE/BANDAGES/DRESSINGS) ×3 IMPLANT
GAUZE SPONGE 4X4 12PLY STRL LF (GAUZE/BANDAGES/DRESSINGS) ×3 IMPLANT
GLOVE BIO SURGEON STRL SZ7.5 (GLOVE) ×3 IMPLANT
GLOVE BIOGEL PI IND STRL 7.0 (GLOVE) ×1 IMPLANT
GLOVE BIOGEL PI IND STRL 8 (GLOVE) ×1 IMPLANT
GLOVE BIOGEL PI INDICATOR 7.0 (GLOVE) ×2
GLOVE BIOGEL PI INDICATOR 8 (GLOVE) ×2
GLOVE ECLIPSE 6.5 STRL STRAW (GLOVE) ×3 IMPLANT
GOWN STRL REUS W/ TWL LRG LVL3 (GOWN DISPOSABLE) ×2 IMPLANT
GOWN STRL REUS W/ TWL XL LVL3 (GOWN DISPOSABLE) ×1 IMPLANT
GOWN STRL REUS W/TWL LRG LVL3 (GOWN DISPOSABLE) ×4
GOWN STRL REUS W/TWL XL LVL3 (GOWN DISPOSABLE) ×2
KIT BASIN OR (CUSTOM PROCEDURE TRAY) ×3 IMPLANT
NEEDLE HYPO 25X1 1.5 SAFETY (NEEDLE) ×3 IMPLANT
NS IRRIG 1000ML POUR BTL (IV SOLUTION) ×3 IMPLANT
PACK ORTHO EXTREMITY (CUSTOM PROCEDURE TRAY) ×3 IMPLANT
PAD CAST 4YDX4 CTTN HI CHSV (CAST SUPPLIES) ×1 IMPLANT
PADDING CAST COTTON 4X4 STRL (CAST SUPPLIES) ×2
PLATE Y 1.4 6H (Plate) ×3 IMPLANT
SCREW 1.4X8 (Screw) ×3 IMPLANT
SCREW 1.4X9 (Screw) ×6 IMPLANT
SCREW CORT LP TI 1.4X11 (Screw) ×3 IMPLANT
SCREW LOCK VA 1.4X11 (Screw) ×3 IMPLANT
SCREW LOCK VA 1.4X12 (Screw) ×9 IMPLANT
SCREW LOCK VA 1.4X13 (Screw) ×3 IMPLANT
SCREW LOCK VA 1.4X7 (Screw) ×3 IMPLANT
STOCKINETTE 6  STRL (DRAPES) ×2
STOCKINETTE 6 STRL (DRAPES) ×1 IMPLANT
SUT VIC AB 4-0 P-3 18X BRD (SUTURE) ×1 IMPLANT
SUT VIC AB 4-0 P3 18 (SUTURE) ×2
SUT VICRYL RAPIDE 4/0 PS 2 (SUTURE) ×3 IMPLANT
SYR 10ML LL (SYRINGE) ×3 IMPLANT
TOWEL GREEN STERILE FF (TOWEL DISPOSABLE) ×3 IMPLANT
TOWEL OR NON WOVEN STRL DISP B (DISPOSABLE) IMPLANT
TUBE CONNECTING 20'X1/4 (TUBING)
TUBE CONNECTING 20X1/4 (TUBING) IMPLANT
UNDERPAD 30X36 HEAVY ABSORB (UNDERPADS AND DIAPERS) ×3 IMPLANT

## 2020-09-21 NOTE — Discharge Instructions (Addendum)
Discharge Instructions   You have a dressing with a plaster splint incorporated in it. Move your fingers as much as possible, making a full fist and fully opening the fist. Elevate your hand to reduce pain & swelling of the digits.  Ice over the operative site may be helpful to reduce pain & swelling.  DO NOT USE HEAT. Pain medicine has been prescribed for you.  Leave the dressing in place until you return to our office.  You may shower, but keep the bandage clean & dry.  You may drive a car when you are off of prescription pain medications and can safely control your vehicle with both hands. Our office will call you to arrange follow-up   Please call [phone] during normal business hours or [phone] after hours for any problems. Including the following:  - excessive redness of the incisions - drainage for more than 4 days - fever of more than 101.5 F  *Please note that pain medications will not be refilled after hours or on weekends.  

## 2020-09-21 NOTE — Anesthesia Preprocedure Evaluation (Addendum)
Anesthesia Evaluation  Patient identified by MRN, date of birth, ID band Patient awake    Reviewed: Allergy & Precautions, NPO status , Patient's Chart, lab work & pertinent test results  Airway Mallampati: II  TM Distance: >3 FB Neck ROM: Full    Dental no notable dental hx.    Pulmonary neg pulmonary ROS,    Pulmonary exam normal breath sounds clear to auscultation       Cardiovascular negative cardio ROS Normal cardiovascular exam Rhythm:Regular Rate:Normal     Neuro/Psych Anxiety negative neurological ROS     GI/Hepatic Neg liver ROS, PUD, GERD  ,  Endo/Other  negative endocrine ROS  Renal/GU negative Renal ROS  negative genitourinary   Musculoskeletal  (+) Arthritis , Fibromyalgia -  Abdominal   Peds negative pediatric ROS (+)  Hematology negative hematology ROS (+)   Anesthesia Other Findings Broken pelvis (right side)  Reproductive/Obstetrics negative OB ROS                            Anesthesia Physical  Anesthesia Plan  ASA: II  Anesthesia Plan: MAC   Post-op Pain Management:    Induction: Intravenous  PONV Risk Score and Plan: 2 and Ondansetron, Dexamethasone and Propofol infusion  Airway Management Planned: Simple Face Mask and Natural Airway  Additional Equipment:   Intra-op Plan:   Post-operative Plan:   Informed Consent: I have reviewed the patients History and Physical, chart, labs and discussed the procedure including the risks, benefits and alternatives for the proposed anesthesia with the patient or authorized representative who has indicated his/her understanding and acceptance.     Dental advisory given  Plan Discussed with: CRNA and Surgeon  Anesthesia Plan Comments: (Local by surgeon. Propofol gtt. GA/LMA as backup plan. )        Anesthesia Quick Evaluation

## 2020-09-21 NOTE — Interval H&P Note (Signed)
History and Physical Interval Note:  09/21/2020 7:34 AM  Tanya Duncan  has presented today for surgery, with the diagnosis of RIGHT LONG FINGER PROXIMAL PHALANX FRACTURE.  The various methods of treatment have been discussed with the patient and family. After consideration of risks, benefits and other options for treatment, the patient has consented to  Procedure(s) with comments: OPEN TREATMENT OF RIGHT LONG FINGER PROXIMAL PHALANX FRACTURE (Right) - LENGTH OF SURGERY: 75 MIN as a surgical intervention.  The patient's history has been reviewed, patient examined, no change in status, stable for surgery.  I have reviewed the patient's chart and labs.  Questions were answered to the patient's satisfaction.     [name]

## 2020-09-21 NOTE — Anesthesia Postprocedure Evaluation (Signed)
Anesthesia Post Note  Patient: Tanya Duncan  Procedure(s) Performed: OPEN TREATMENT OF RIGHT LONG FINGER PROXIMAL PHALANX FRACTURE (Right Finger)     Patient location during evaluation: PACU Anesthesia Type: MAC Level of consciousness: awake and alert Pain management: pain level controlled Vital Signs Assessment: post-procedure vital signs reviewed and stable Respiratory status: spontaneous breathing and respiratory function stable Cardiovascular status: stable Postop Assessment: no apparent nausea or vomiting Anesthetic complications: no   No complications documented.  Last Vitals:  Vitals:   09/21/20 0910 09/21/20 0925  BP: 116/73 119/79  Pulse: 82 81  Resp: 14 19  Temp:  (!) 36.2 C  SpO2: 94% 97%    Last Pain:  Vitals:   09/21/20 0925  TempSrc:   PainSc: 0-No pain                 Merlinda Frederick

## 2020-09-21 NOTE — Transfer of Care (Signed)
Immediate Anesthesia Transfer of Care Note  Patient: Tanya Duncan  Procedure(s) Performed: OPEN TREATMENT OF RIGHT LONG FINGER PROXIMAL PHALANX FRACTURE (Right Finger)  Patient Location: PACU  Anesthesia Type:MAC and Regional  Level of Consciousness: awake, alert  and oriented  Airway & Oxygen Therapy: Patient Spontanous Breathing  Post-op Assessment: Report given to RN  Post vital signs: Reviewed and stable  Last Vitals:  Vitals Value Taken Time  BP    Temp    Pulse    Resp    SpO2      Last Pain:  Vitals:   09/21/20 0855  TempSrc:   PainSc: (P) 0-No pain      Patients Stated Pain Goal: 3 (81/82/99 3716)  Complications: No complications documented.

## 2020-09-21 NOTE — Telephone Encounter (Signed)
Per procedure reports from 09/18/20 patient needs GES.  She is scheduled for Dec 7 at [facility] to arrive at 7:15 for 7:30.  She is asked to be NPO after midnight and to hold her carafate, pantoprazole, and zofran prior to the procedure. Patient has been notified of all.  Follow up office visit scheduled for 11/13/20 10:30

## 2020-09-21 NOTE — Op Note (Signed)
09/21/2020  7:34 AM  PATIENT:  Tanya Duncan  74 y.o. female  PRE-OPERATIVE DIAGNOSIS:  Displaced R LF P1 diaphyseal fx  POST-OPERATIVE DIAGNOSIS:  Same  PROCEDURE:  ORIF R LF P1 shaft fx  SURGEON: Rayvon Char. Grandville Silos, MD  PHYSICIAN ASSISTANT: none  ANESTHESIA:  local and MAC  SPECIMENS:  None  DRAINS:   None  EBL:  less than 50 mL  PREOPERATIVE INDICATIONS:  [name] is a  74 y.o. female with a displaced R LF P1 fx  The risks benefits and alternatives were discussed with the patient preoperatively including but not limited to the risks of infection, bleeding, nerve injury, cardiopulmonary complications, the need for revision surgery, among others, and the patient verbalized understanding and consented to proceed.  OPERATIVE IMPLANTS: Arthrex 1.6m plate/screws  OPERATIVE PROCEDURE:  After receiving prophylactic antibiotics, the patient was escorted to the operative theatre and placed in a supine position.  A surgical "time-out" was performed during which the planned procedure, proposed operative site, and the correct patient identity were compared to the operative consent and agreement confirmed by the circulating nurse according to current facility policy.  Digital block was performed by me with a mixture of lidocaine Marcaine Baring epinephrine.  Following application of a tourniquet to the operative extremity, the exposed skin was prepped with Chloraprep and draped in the usual sterile fashion.  The limb was exsanguinated with an Esmarch bandage and the tourniquet inflated to approximately 1053mg higher than systolic BP.  A incision was marked just radial of midline on the dorsum, curving ulnarly proximally and distally for a couple millimeters creating a broad-based ulnar-sided flap.  This was elevated full-thickness.  The extensor apparatus was split in the midline and reflected.  The periosteum was split radial of midline and reflected radially and ulnarly.  There was  comminution at the fracture site, but there was one area where a cortical contact and interdigitation could be made.  This allowed for anatomic alignment of the fracture.  A dorsal T-plate was applied in standard fashion, and secured with locking screws.  Effort was made help draw a couple of the comminuted fragments into the construct, particularly large radial and volar sided fragment that had fractured off of the distal major fragment.  Alignment was judged to be near-anatomic with acceptable placement of hardware and final images were obtained.  Clinical alignment was good without significant angulation or malrotation.  The wound was irrigated and the periosteum reapproximated over the plate with 4-0 Vicryl Rapide running suture.  The extensor apparatus was repaired with 4-0 Vicryl running suture and the tourniquet was released.  Some additional hemostasis was obtained and the skin was closed with 4-0 Vicryl Rapide running horizontal mattress suture.  A short arm splint dressing was applied and she was taken recovery room in stable condition.  DISPOSITION: She will be discharged home today, returning to the office in a few days to see therapy and have a custom splint fabricated and begin her rehab, then again in 10 to 15 days to see us[place]ith new x-rays of the right long finger out of splint

## 2020-09-21 NOTE — Anesthesia Procedure Notes (Signed)
Procedure Name: MAC Date/Time: 09/21/2020 7:43 AM Performed by: Barrington Ellison, CRNA Pre-anesthesia Checklist: Patient identified, Emergency Drugs available, Suction available, Patient being monitored and Timeout performed Patient Re-evaluated:Patient Re-evaluated prior to induction Oxygen Delivery Method: Simple face mask

## 2020-09-22 ENCOUNTER — Telehealth: Payer: Self-pay

## 2020-09-22 ENCOUNTER — Encounter (HOSPITAL_COMMUNITY): Payer: Self-pay | Admitting: Orthopedic Surgery

## 2020-09-22 NOTE — Telephone Encounter (Signed)
No answer, left message to call if having any issues or concerns, B.Kayleah Appleyard RN 

## 2020-09-29 ENCOUNTER — Encounter: Payer: Self-pay | Admitting: Gastroenterology

## 2020-10-12 ENCOUNTER — Telehealth: Payer: Self-pay | Admitting: Gastroenterology

## 2020-10-12 NOTE — Telephone Encounter (Signed)
Patient notified that scan has been cancelled.  She will call back to reschedule when she has improved

## 2020-10-12 NOTE — Telephone Encounter (Signed)
GES

## 2020-10-13 ENCOUNTER — Encounter (HOSPITAL_COMMUNITY): Payer: Medicare Other

## 2020-10-15 ENCOUNTER — Other Ambulatory Visit: Payer: Self-pay | Admitting: Family Medicine

## 2020-10-15 DIAGNOSIS — M25551 Pain in right hip: Secondary | ICD-10-CM

## 2020-11-13 ENCOUNTER — Ambulatory Visit: Payer: Medicare Other | Admitting: Gastroenterology

## 2020-11-18 ENCOUNTER — Other Ambulatory Visit: Payer: Self-pay | Admitting: Orthopaedic Surgery

## 2020-11-20 ENCOUNTER — Other Ambulatory Visit: Payer: Self-pay | Admitting: Orthopaedic Surgery

## 2020-11-20 DIAGNOSIS — M545 Low back pain, unspecified: Secondary | ICD-10-CM

## 2020-11-25 ENCOUNTER — Telehealth: Payer: Self-pay

## 2020-11-25 NOTE — Telephone Encounter (Signed)
Spoke to pt concerning upcoming sacroplasty.  Reviewed allergies and weight.  Advised not to take any oral antibiotics if prescribed for this procedure because she will receive IV antibiotics day of procedure.  Confirmed consult scheduled for 12/04/20.

## 2020-12-04 ENCOUNTER — Other Ambulatory Visit: Payer: Self-pay

## 2020-12-04 ENCOUNTER — Inpatient Hospital Stay: Admission: RE | Admit: 2020-12-04 | Payer: Medicare Other | Source: Ambulatory Visit

## 2020-12-04 ENCOUNTER — Ambulatory Visit
Admission: RE | Admit: 2020-12-04 | Discharge: 2020-12-04 | Disposition: A | Discharge status: Home / Self Care | Payer: Medicare Other | Source: Ambulatory Visit | Attending: Orthopaedic Surgery | Admitting: Orthopaedic Surgery

## 2020-12-04 ENCOUNTER — Other Ambulatory Visit: Payer: Self-pay | Admitting: Orthopaedic Surgery

## 2020-12-04 DIAGNOSIS — M545 Low back pain, unspecified: Secondary | ICD-10-CM

## 2020-12-04 DIAGNOSIS — G8929 Other chronic pain: Secondary | ICD-10-CM

## 2020-12-04 NOTE — Consult Note (Signed)
Chief Complaint: Patient was seen in consultation today for left sacral fracture at the request of Dalldorf,Peter  Referring Physician(s): Dalldorf,Peter  History of Present Illness: Tanya Duncan is a 75 y.o. female who sustained a fall Sep 08, 2020.  Ct scan of her right hip revealed right superior and inferior pubic rami fractures.  She had an mri of her lumbar spine 11/13/20 which demonstrated a non healed left sacral ala fracture.  Although not mentioned in the report, there appears to be a fracture at the inferior endplate of L5 as well.  She has had continued pain since the fall.  Pain is across her posterior pelvis.  It is greatest when first getting up from a recumbent or sitting position.  With tramadol to 3 times a day she states her pain is 6/10 on a visual analog scale.  She scored 11/24 on the [employer] Disability questionnaire.  She ambulates without assistance.   Past Medical History:  Diagnosis Date  . Anxiety   . Arthritis   . Blood transfusion without reported diagnosis 05/2019   with hip surgery  . Fibromyalgia   . Fractured pelvis (HCC)    right  . GERD (gastroesophageal reflux disease)   . GI bleed    x2  . Hyperlipidemia   . Pneumonia 2005  . Stomach ulcer 1990    Past Surgical History:  Procedure Laterality Date  . ABDOMINAL HYSTERECTOMY    . JOINT REPLACEMENT    . LUMBAR FUSION  2015  . OPEN REDUCTION INTERNAL FIXATION (ORIF) PROXIMAL PHALANX Right 09/21/2020   Procedure: OPEN TREATMENT OF RIGHT LONG FINGER PROXIMAL PHALANX FRACTURE;  Surgeon: Mack Hook, MD;  Location: [facility];  Service: Orthopedics;  Laterality: Right;  LENGTH OF SURGERY: 75 MIN  . PYLOROPLASTY    . TOTAL HIP ARTHROPLASTY  2011  . TOTAL HIP ARTHROPLASTY Right 05/14/2019   Procedure: Right Anterior Hip Arthroplasty;  Surgeon: Marcene Corning, MD;  Location: [facility];  Service: Orthopedics;  Laterality: Right;  . TOTAL SHOULDER REPLACEMENT  2014     Allergies: Atorvastatin, Ibuprofen, Indocin [indomethacin], Sage [salvia officinalis], Sulfa antibiotics, and Avelox [moxifloxacin hcl in nacl]  Medications: Prior to Admission medications   Medication Sig Start Date End Date Taking? Authorizing Provider  acetaminophen (TYLENOL) 500 MG tablet Take 1,000 mg by mouth every 6 (six) hours as needed for moderate pain or headache.     [provider]  Ascorbic Acid (VITAMIN C) 1000 MG tablet Take 1,000 mg by mouth daily.     [provider]  b complex vitamins tablet Take 1 tablet by mouth daily.    [provider]  bismuth subsalicylate (PEPTO BISMOL) 262 MG/15ML suspension Take 30 mLs by mouth every 6 (six) hours as needed for diarrhea or loose stools. Patient not taking: Reported on 09/18/2020    [provider]  buPROPion (WELLBUTRIN XL) 150 MG 24 hr tablet Take 150 mg by mouth daily.  08/07/20   [provider]  Calcium Polycarbophil (FIBER-CAPS PO) Take 3 tablets by mouth daily.    [provider]  Cholecalciferol (VITAMIN D3 PO) Take 1 capsule by mouth daily.    [provider]  diclofenac Sodium (VOLTAREN) 1 % GEL Place 1 application onto the skin 4 (four) times daily as needed (pain).  Patient not taking: Reported on 09/18/2020 03/07/19   [provider]  ergocalciferol (VITAMIN D2) 1.25 MG (50000 UT) capsule Take 50,000 Units by mouth every Monday.  08/26/20   [provider]  escitalopram (LEXAPRO) 20 MG tablet Take 20 mg by mouth daily.    [provider]  Ferrous Sulfate (IRON PO) Take 1 tablet by mouth 3 (three) times a week.    [provider]  gabapentin (NEURONTIN) 300 MG capsule Take 600 mg by mouth 2 (two) times daily.     [provider]  LORazepam (ATIVAN) 1 MG tablet Take 1 mg by mouth 2 (two) times daily.    [provider]  Melatonin 5 MG TABS Take 5 mg by mouth at bedtime.    [provider]   methocarbamol (ROBAXIN) 500 MG tablet Take 500 mg by mouth every 6 (six) hours as needed for muscle spasms.  08/25/20   [provider]  Multiple Vitamin (MULTIVITAMIN WITH MINERALS) TABS tablet Take 1 tablet by mouth daily.    [provider]  nitroGLYCERIN (NITROSTAT) 0.3 MG SL tablet Place 0.3 mg under the tongue every 5 (five) minutes as needed for chest pain. Patient not taking: Reported on 09/18/2020    [provider]  nortriptyline (PAMELOR) 25 MG capsule Take 100 mg by mouth at bedtime.     [provider]  oxycodone (OXY-IR) 5 MG capsule Take 1 capsule (5 mg total) by mouth every 6 (six) hours as needed for pain (severe postop pain). 09/21/20   Mack Hook, MD  pantoprazole (PROTONIX) 40 MG tablet Take 1 tablet (40 mg total) by mouth 2 (two) times daily. 09/18/20   Meryl Dare, MD  pravastatin (PRAVACHOL) 80 MG tablet Take 80 mg by mouth daily.    [provider]  sucralfate (CARAFATE) 1 g tablet Take 1 g by mouth 4 (four) times daily. 07/09/20   [provider]  zolpidem (AMBIEN) 10 MG tablet Take 10 mg by mouth at bedtime as needed for sleep.     [provider]     Family History  Problem Relation Age of Onset  . Colon cancer Father 64    Social History   Socioeconomic History  . Marital status: Divorced    Spouse name: Not on file  . Number of children: Not on file  . Years of education: Not on file  . Highest education level: Not on file  Occupational History  . Occupation: Retired  Tobacco Use  . Smoking status: Never Smoker  . Smokeless tobacco: Never Used  Vaping Use  . Vaping Use: Never used  Substance and Sexual Activity  . Alcohol use: Yes    Comment: rare  . Drug use: Never  . Sexual activity: Not Currently    Birth control/protection: Surgical    Comment: Hysterectomy  Other Topics Concern  . Not on file  Social History Narrative  . Not on file   Social Determinants of Health    Financial Resource Strain: Not on file  Food Insecurity: Not on file  Transportation Needs: Not on file  Physical Activity: Not on file  Stress: Not on file  Social Connections: Not on file      Review of Systems: A 12 point ROS discussed and pertinent positives are indicated in the HPI above.  All other systems are negative.  Review of Systems  Musculoskeletal: Positive for arthralgias and joint swelling.  All other systems reviewed and are negative.   Vital Signs: BP 131/66   Pulse (!) 101   Temp (!) 95.5 F (35.3 C) Comment: oral  Resp 18   Wt 71.2 kg   LMP  (LMP Unknown)  SpO2 99% Comment: room air  BMI 29.66 kg/m   Physical Exam Constitutional:      General: She is not in acute distress.    Appearance: Normal appearance. She is not ill-appearing, toxic-appearing or diaphoretic.  Neurological:     Mental Status: She is alert.     Motor: Motor function is intact.     Gait: Gait abnormal.     Comments: Antalgic gait related to R knee.     Imaging: No results found.  Labs:  CBC: Recent Labs    09/21/20 0718  WBC 6.0  HGB 11.5*  HCT 36.1  PLT 203    COAGS: No results for input(s): INR, APTT in the last 8760 hours.  BMP: No results for input(s): NA, K, CL, CO2, GLUCOSE, BUN, CALCIUM, CREATININE, GFRNONAA, GFRAA in the last 8760 hours.  Invalid input(s): CMP  LIVER FUNCTION TESTS: No results for input(s): BILITOT, AST, ALT, ALKPHOS, PROT, ALBUMIN in the last 8760 hours.  TUMOR MARKERS: No results for input(s): AFPTM, CEA, CA199, CHROMGRNA in the last 8760 hours.  Assessment and Plan:  Left post traumatic sacral fracture.  Given the nature of the trauma, this is likely an insufficieny fracture.  The patient has consistent pain despite 3 months of conservative therapy.  She would likely benefit from sacroplasty to facilitate healing.    I am concerned about L5.  If there is persistent edema, vertebroplasty would be appropriate as well.  I  am recommending a repeat mri of the sacrum for treatment planning purposes as well as to evaluate for interim right sacral fracture of which she is at risk and assessment of the L5 fracture.  I recommend at least left sacroplasty and L5 vertebrolasty.  If she has a right sacral fracture in the interval, bilateral sacroplasty is recommended.   Thank you for this interesting consult.  I greatly enjoyed meeting Allsion Mcelvany and look forward to participating in their care.  A copy of this report was sent to the requesting provider on this date.  Electronically Signed: Chauncey Fischer, MD 12/04/2020, 3:34 PM   I spent a total of  15 Minutes   in face to face in clinical consultation, greater than 50% of which was counseling/coordinating care for her sacral fracture.

## 2020-12-07 ENCOUNTER — Other Ambulatory Visit: Payer: Self-pay | Admitting: Orthopaedic Surgery

## 2020-12-07 ENCOUNTER — Other Ambulatory Visit: Payer: Self-pay | Admitting: Family Medicine

## 2020-12-07 DIAGNOSIS — R52 Pain, unspecified: Secondary | ICD-10-CM

## 2020-12-15 ENCOUNTER — Ambulatory Visit: Payer: Medicare Other | Admitting: Gastroenterology

## 2020-12-15 ENCOUNTER — Ambulatory Visit
Admission: RE | Admit: 2020-12-15 | Discharge: 2020-12-15 | Disposition: A | Discharge status: Home / Self Care | Payer: Medicare Other | Source: Ambulatory Visit | Attending: Orthopaedic Surgery | Admitting: Orthopaedic Surgery

## 2020-12-15 DIAGNOSIS — R52 Pain, unspecified: Secondary | ICD-10-CM

## 2020-12-18 ENCOUNTER — Other Ambulatory Visit: Payer: Self-pay | Admitting: Radiology

## 2020-12-18 ENCOUNTER — Other Ambulatory Visit: Payer: Self-pay

## 2020-12-18 ENCOUNTER — Other Ambulatory Visit: Payer: Medicare Other

## 2020-12-18 ENCOUNTER — Other Ambulatory Visit: Payer: Self-pay | Admitting: Orthopaedic Surgery

## 2020-12-18 ENCOUNTER — Ambulatory Visit
Admission: RE | Admit: 2020-12-18 | Discharge: 2020-12-18 | Disposition: A | Discharge status: Home / Self Care | Payer: Medicare Other | Source: Ambulatory Visit | Attending: Orthopaedic Surgery | Admitting: Orthopaedic Surgery

## 2020-12-18 DIAGNOSIS — M545 Low back pain, unspecified: Secondary | ICD-10-CM

## 2020-12-18 DIAGNOSIS — G8929 Other chronic pain: Secondary | ICD-10-CM

## 2020-12-18 MED ORDER — CEFAZOLIN SODIUM-DEXTROSE 2-4 GM/100ML-% IV SOLN
2.0000 g | INTRAVENOUS | Status: AC
  Administered 2020-12-18: 2 g via INTRAVENOUS

## 2020-12-18 MED ORDER — FENTANYL CITRATE (PF) 100 MCG/2ML IJ SOLN
25.0000 ug | INTRAMUSCULAR | Status: DC | PRN
  Administered 2020-12-18: 25 ug via INTRAVENOUS
  Administered 2020-12-18: 50 ug via INTRAVENOUS
  Administered 2020-12-18 (×4): 25 ug via INTRAVENOUS

## 2020-12-18 MED ORDER — SODIUM CHLORIDE 0.9 % IV SOLN: Freq: Once | INTRAVENOUS | Status: DC

## 2020-12-18 MED ORDER — KETOROLAC TROMETHAMINE 30 MG/ML IJ SOLN
30.0000 mg | Freq: Once | INTRAMUSCULAR | Status: AC
  Administered 2020-12-18: 30 mg via INTRAVENOUS

## 2020-12-18 MED ORDER — MIDAZOLAM HCL 2 MG/2ML IJ SOLN
1.0000 mg | INTRAMUSCULAR | Status: DC | PRN
  Administered 2020-12-18: 1 mg via INTRAVENOUS
  Administered 2020-12-18: 0.5 mg via INTRAVENOUS
  Administered 2020-12-18: 1 mg via INTRAVENOUS

## 2020-12-18 NOTE — Discharge Instructions (Signed)
Sacroplasty Post Procedure Discharge Instructions ° °1. May resume a regular diet and any medications that you routinely take (including pain medications). °2. No driving day of procedure. °3. Upon discharge go home and rest for at least 4 hours.  May use an ice pack as needed to injection sites on back.  Ice to back 30 minutes on and 30 minutes off, all day. °4. May remove bandaids tomorrow after taking a shower. Replace daily with clean bandaid until healed. °5. Do not lift anything heavier than a milk jug. °6. Follow up with your attending physician in 2 weeks. ° ° ° °Please contact our office at [phone] for the following symptoms: ° °· Fever greater than 100 degrees °· Increased swelling, pain, or redness at injection site. ° ° °Thank you for visiting [facility]. °

## 2020-12-18 NOTE — Progress Notes (Signed)
[id number] pt onto stretcher and transferred to nurse's area for recovery.  Reconnected to vital sign machine and on room air now.  Denied pain. 1014 Dr Alfredo Batty in to talk with pt about condition and procedure.

## 2020-12-28 ENCOUNTER — Ambulatory Visit
Admission: RE | Admit: 2020-12-28 | Discharge: 2020-12-28 | Disposition: A | Discharge status: Home / Self Care | Payer: Medicare Other | Source: Ambulatory Visit | Attending: Radiology | Admitting: Radiology

## 2020-12-28 ENCOUNTER — Other Ambulatory Visit: Payer: Self-pay

## 2020-12-28 DIAGNOSIS — M545 Low back pain, unspecified: Secondary | ICD-10-CM

## 2020-12-28 DIAGNOSIS — G8929 Other chronic pain: Secondary | ICD-10-CM

## 2020-12-31 ENCOUNTER — Telehealth: Payer: Self-pay | Admitting: Gastroenterology

## 2020-12-31 NOTE — Telephone Encounter (Signed)
Patient called wanting to schedule procedure for GES

## 2021-01-01 NOTE — Telephone Encounter (Signed)
Patient had to cancel her GES due to a broken pelvis.  She is advised she will be contacted directly by [facility] radiology to schedule via VM

## 2021-01-03 NOTE — Evaluation (Signed)
Referring Physician(s): Dalldorf, Petere   Chief Complaint: The patient is seen in follow up today s/p left sacroplasty & L5 unipedicular vertebroplasty   History of present illness:  Patient reports minimal change to mid back pain following sacroplasty and vertebroplasty.  The procedure was technically satisfactory.  Trabecular filling patterns were achieved in the left sacral ala and in the L5 vertebroplasty.  Reports pain into her hip.  While she ambulates without assistance, she has not altered gait.  She reports her pain level 6/10.  She scored 9/24 on the [employer] disability questionnaire, not significantly changed.   Past Medical History:  Diagnosis Date  . Anxiety   . Arthritis   . Blood transfusion without reported diagnosis 05/2019   with hip surgery  . Fibromyalgia   . Fractured pelvis (HCC)    right  . GERD (gastroesophageal reflux disease)   . GI bleed    x2  . Hyperlipidemia   . Pneumonia 2005  . Stomach ulcer 1990    Past Surgical History:  Procedure Laterality Date  . ABDOMINAL HYSTERECTOMY    . JOINT REPLACEMENT    . LUMBAR FUSION  2015  . OPEN REDUCTION INTERNAL FIXATION (ORIF) PROXIMAL PHALANX Right 09/21/2020   Procedure: OPEN TREATMENT OF RIGHT LONG FINGER PROXIMAL PHALANX FRACTURE;  Surgeon: Mack Hook, MD;  Location: [facility] OR;  Service: Orthopedics;  Laterality: Right;  LENGTH OF SURGERY: 75 MIN  . PYLOROPLASTY    . TOTAL HIP ARTHROPLASTY  2011  . TOTAL HIP ARTHROPLASTY Right 05/14/2019   Procedure: Right Anterior Hip Arthroplasty;  Surgeon: Marcene Corning, MD;  Location: [facility] ORS;  Service: Orthopedics;  Laterality: Right;  . TOTAL SHOULDER REPLACEMENT  2014    Allergies: Atorvastatin, Ibuprofen, Sage [salvia officinalis], Sulfa antibiotics, Avelox [moxifloxacin hcl in nacl], and Indocin [indomethacin]  Medications: Prior to Admission medications   Medication Sig Start Date End Date Taking? Authorizing Provider  acetaminophen  (TYLENOL) 500 MG tablet Take 1,000 mg by mouth every 6 (six) hours as needed for moderate pain or headache.     [provider]  Ascorbic Acid (VITAMIN C) 1000 MG tablet Take 1,000 mg by mouth daily.     [provider]  b complex vitamins tablet Take 1 tablet by mouth daily.    [provider]  bismuth subsalicylate (PEPTO BISMOL) 262 MG/15ML suspension Take 30 mLs by mouth every 6 (six) hours as needed for diarrhea or loose stools. Patient not taking: Reported on 09/18/2020    [provider]  buPROPion (WELLBUTRIN XL) 150 MG 24 hr tablet Take 150 mg by mouth daily.  08/07/20   [provider]  Calcium Polycarbophil (FIBER-CAPS PO) Take 3 tablets by mouth daily.    [provider]  Cholecalciferol (VITAMIN D3 PO) Take 1 capsule by mouth daily.    [provider]  diclofenac Sodium (VOLTAREN) 1 % GEL Place 1 application onto the skin 4 (four) times daily as needed (pain).  Patient not taking: Reported on 09/18/2020 03/07/19   [provider]  ergocalciferol (VITAMIN D2) 1.25 MG (50000 UT) capsule Take 50,000 Units by mouth every Monday.  08/26/20   [provider]  escitalopram (LEXAPRO) 20 MG tablet Take 20 mg by mouth daily.    [provider]  Ferrous Sulfate (IRON PO) Take 1 tablet by mouth 3 (three) times a week.    [provider]  gabapentin (NEURONTIN) 300 MG capsule Take 600 mg by mouth 2 (two) times daily.  [provider]  LORazepam (ATIVAN) 1 MG tablet Take 1 mg by mouth 2 (two) times daily.    [provider]  Melatonin 5 MG TABS Take 5 mg by mouth at bedtime.    [provider]  methocarbamol (ROBAXIN) 500 MG tablet Take 500 mg by mouth every 6 (six) hours as needed for muscle spasms.  08/25/20   [provider]  Multiple Vitamin (MULTIVITAMIN WITH MINERALS) TABS tablet Take 1 tablet by mouth daily.    [provider]  nitroGLYCERIN  (NITROSTAT) 0.3 MG SL tablet Place 0.3 mg under the tongue every 5 (five) minutes as needed for chest pain. Patient not taking: Reported on 09/18/2020    [provider]  nortriptyline (PAMELOR) 25 MG capsule Take 100 mg by mouth at bedtime.     [provider]  oxycodone (OXY-IR) 5 MG capsule Take 1 capsule (5 mg total) by mouth every 6 (six) hours as needed for pain (severe postop pain). 09/21/20   Mack Hook, MD  pantoprazole (PROTONIX) 40 MG tablet Take 1 tablet (40 mg total) by mouth 2 (two) times daily. 09/18/20   Meryl Dare, MD  pravastatin (PRAVACHOL) 80 MG tablet Take 80 mg by mouth daily.    [provider]  sucralfate (CARAFATE) 1 g tablet Take 1 g by mouth 4 (four) times daily. 07/09/20   [provider]  zolpidem (AMBIEN) 10 MG tablet Take 10 mg by mouth at bedtime as needed for sleep.     [provider]     Family History  Problem Relation Age of Onset  . Colon cancer Father 38    Social History   Socioeconomic History  . Marital status: Divorced    Spouse name: Not on file  . Number of children: Not on file  . Years of education: Not on file  . Highest education level: Not on file  Occupational History  . Occupation: Retired  Tobacco Use  . Smoking status: Never Smoker  . Smokeless tobacco: Never Used  Vaping Use  . Vaping Use: Never used  Substance and Sexual Activity  . Alcohol use: Yes    Comment: rare  . Drug use: Never  . Sexual activity: Not Currently    Birth control/protection: Surgical    Comment: Hysterectomy  Other Topics Concern  . Not on file  Social History Narrative  . Not on file   Social Determinants of Health   Financial Resource Strain: Not on file  Food Insecurity: Not on file  Transportation Needs: Not on file  Physical Activity: Not on file  Stress: Not on file  Social Connections: Not on file     Vital Signs: BP (!) 143/95 (BP Location: Right Arm, Patient Position:  Sitting)   Pulse (!) 106   Temp 98.3 F (36.8 C) (Oral)   Resp 18   LMP  (LMP Unknown)   SpO2 98% Comment: room air  Physical Exam Constitutional:      Appearance: Normal appearance. She is normal weight.  Neurological:     Mental Status: She is alert.   Exam demonstrates pain in the mid lumbar region rather than in the posterior pelvic region with palpation.  The mid back pain is not exacerbated with palpation.  Strength in the lower extremities is normal.  Gait is unchanged.]   Imaging: No results found.  Labs:  CBC: Recent Labs    09/21/20 0718  WBC 6.0  HGB 11.5*  HCT 36.1  PLT 203  COAGS: No results for input(s): INR, APTT in the last 8760 hours.  BMP: No results for input(s): NA, K, CL, CO2, GLUCOSE, BUN, CALCIUM, CREATININE, GFRNONAA, GFRAA in the last 8760 hours.  Invalid input(s): CMP  LIVER FUNCTION TESTS: No results for input(s): BILITOT, AST, ALT, ALKPHOS, PROT, ALBUMIN in the last 8760 hours.  Assessment and Plan:  1. No significant change in low back pain following sacroplasty and L5 vertebroplasty.  Both procedures were performed without complication.  This suggests alternative pain generators, likely related to her lumbar spine.  2. I discussed this with the patient.  She was satisfied with the information and had no additional questions.  3. Recommend bone density assessment and treatment if appropriate.    Electronically Signed: Shenita Trego W 01/03/2021, 8:12 PM   I spent a total of 10 Minutes in face to face in clinical consultation, greater than 50% of which was counseling/coordinating care for follow up of her sacroplasty and vertebroplasty

## 2021-01-20 ENCOUNTER — Telehealth: Payer: Self-pay | Admitting: Gastroenterology

## 2021-01-20 NOTE — Telephone Encounter (Signed)
I left a message for the patient with the number to central scheduling of [phone] to assist her in rescheduling the GES

## 2021-01-22 ENCOUNTER — Other Ambulatory Visit: Payer: Self-pay | Admitting: Orthopedic Surgery

## 2021-01-22 ENCOUNTER — Ambulatory Visit (HOSPITAL_COMMUNITY): Payer: Medicare Other

## 2021-01-22 DIAGNOSIS — Z01811 Encounter for preprocedural respiratory examination: Secondary | ICD-10-CM

## 2021-01-28 NOTE — Patient Instructions (Addendum)
DUE TO COVID-19 ONLY ONE VISITOR IS ALLOWED TO COME WITH YOU AND STAY IN THE WAITING ROOM ONLY DURING PRE OP AND PROCEDURE DAY OF SURGERY. THE 2 VISITORS   MAY VISIT WITH YOU AFTER SURGERY IN YOUR PRIVATE ROOM DURING VISITING HOURS ONLY!  YOU NEED TO HAVE A COVID 19 TEST ON_3/28______ @__12 :45pm_____, THIS TEST MUST BE DONE BEFORE SURGERY,  COVID TESTING SITE [address] [place] , IT IS ON THE RIGHT GOING OUT [address] APPROXIMATELY  2 MINUTES PAST [employer] ON THE RIGHT. ONCE YOUR COVID TEST IS COMPLETED,  PLEASE BEGIN THE QUARANTINE INSTRUCTIONS AS OUTLINED IN YOUR HANDOUT.                Tanya Duncan   Your procedure is scheduled on: 02/04/21   Report to [facility] Main  Entrance   Report to admitting at  7:15 AM     Call this number if you have problems the morning of surgery [phone]     BRUSH YOUR TEETH MORNING OF SURGERY AND RINSE YOUR MOUTH OUT, NO CHEWING GUM CANDY OR MINTS.   No food after midnight.    You may have clear liquid until 6:30 AM.    At 6:00  AM drink pre surgery drink.   Nothing by mouth after 6:30 AM.    Take these medicines the morning of surgery with A SIP OF WATER: Gabapentin, Wellbutrin, Lexapro, Pantoprazole, Ativan if needed                                 You may not have any metal on your body including hair pins and              piercings  Do not wear jewelry, make-up, lotions, powders or perfumes, deodorant             Do not wear nail polish on your fingernails.  Do not shave  48 hours prior to surgery.     Do not bring valuables to the hospital. [facility] IS NOT             RESPONSIBLE   FOR VALUABLES.  Contacts, dentures or bridgework may not be worn into surgery.      Patients discharged the day of surgery will not be allowed to drive home.   IF YOU ARE HAVING SURGERY AND GOING HOME THE SAME DAY, YOU MUST HAVE AN ADULT TO DRIVE YOU HOME AND BE WITH YOU FOR 24 HOURS.  YOU MAY GO  HOME BY TAXI OR UBER OR ORTHERWISE, BUT AN ADULT MUST ACCOMPANY YOU HOME AND STAY WITH YOU FOR 24 HOURS.  Name and phone number of your driver:  Special Instructions: N/A              Please read over the following fact sheets you were given: _____________________________________________________________________             [facility]- Preparing for Total Shoulder Arthroplasty    Before surgery, you can play an important role. Because skin is not sterile, your skin needs to be as free of germs as possible. You can reduce the number of germs on your skin by using the following products. . Benzoyl Peroxide Gel o Reduces the number of germs present on the skin o Applied twice a day to shoulder area starting two days before surgery    ==================================================================  Please follow these instructions  carefully:  BENZOYL PEROXIDE 5% GEL  Please do not use if you have an allergy to benzoyl peroxide.   If your skin becomes reddened/irritated stop using the benzoyl peroxide.  Starting two days before surgery, apply as follows: 1. Apply benzoyl peroxide in the morning and at night. Apply after taking a shower. If you are not taking a shower clean entire shoulder front, back, and side along with the armpit with a clean wet washcloth.  2. Place a quarter-sized dollop on your shoulder and rub in thoroughly, making sure to cover the front, back, and side of your shoulder, along with the armpit.   2 days before ____ AM   ____ PM              1 day before ____ AM   ____ PM                         3. Do this twice a day for two days.  (Last application is the night before surgery, AFTER using the CHG soap as described below).  4. Do NOT apply benzoyl peroxide gel on the day of surgery.   [facility] - Preparing for Surgery Before surgery, you can play an important role.  Because skin is not sterile, your skin needs to be as free of germs as possible.  You can  reduce the number of germs on your skin by washing with CHG (chlorahexidine gluconate) soap before surgery.  CHG is an antiseptic cleaner which kills germs and bonds with the skin to continue killing germs even after washing. Please DO NOT use if you have an allergy to CHG or antibacterial soaps.  If your skin becomes reddened/irritated stop using the CHG and inform your nurse when you arrive at Short Stay. Do not shave (including legs and underarms) for at least 48 hours prior to the first CHG shower.   Please follow these instructions carefully:  1.  Shower with CHG Soap the night before surgery and the  morning of Surgery.  2.  If you choose to wash your hair, wash your hair first as usual with your  normal  shampoo.  3.  After you shampoo, rinse your hair and body thoroughly to remove the  shampoo.                                        4.  Use CHG as you would any other liquid soap.  You can apply chg directly  to the skin and wash                       Gently with a scrungie or clean washcloth.  5.  Apply the CHG Soap to your body ONLY FROM THE NECK DOWN.   Do not use on face/ open                           Wound or open sores. Avoid contact with eyes, ears mouth and genitals (private parts).                       Wash face,  Genitals (private parts) with your normal soap.             6.  Wash thoroughly, paying special attention to the area  where your surgery  will be performed.  7.  Thoroughly rinse your body with warm water from the neck down.  8.  DO NOT shower/wash with your normal soap after using and rinsing off  the CHG Soap.             9.  Pat yourself dry with a clean towel.            10.  Wear clean pajamas.            11.  Place clean sheets on your bed the night of your first shower and do not  sleep with pets. Day of Surgery : Do not apply any lotions/deodorants the morning of surgery.  Please wear clean clothes to the hospital/surgery center.  FAILURE TO FOLLOW THESE  INSTRUCTIONS MAY RESULT IN THE CANCELLATION OF YOUR SURGERY PATIENT SIGNATURE_________________________________  NURSE SIGNATURE__________________________________  ________________________________________________________________________   Tanya Duncan  An incentive spirometer is a tool that can help keep your lungs clear and active. This tool measures how well you are filling your lungs with each breath. Taking long deep breaths may help reverse or decrease the chance of developing breathing (pulmonary) problems (especially infection) following:  A long period of time when you are unable to move or be active. BEFORE THE PROCEDURE   If the spirometer includes an indicator to show your best effort, your nurse or respiratory therapist will set it to a desired goal.  If possible, sit up straight or lean slightly forward. Try not to slouch.  Hold the incentive spirometer in an upright position. INSTRUCTIONS FOR USE  1. Sit on the edge of your bed if possible, or sit up as far as you can in bed or on a chair. 2. Hold the incentive spirometer in an upright position. 3. Breathe out normally. 4. Place the mouthpiece in your mouth and seal your lips tightly around it. 5. Breathe in slowly and as deeply as possible, raising the piston or the ball toward the top of the column. 6. Hold your breath for 3-5 seconds or for as long as possible. Allow the piston or ball to fall to the bottom of the column. 7. Remove the mouthpiece from your mouth and breathe out normally. 8. Rest for a few seconds and repeat Steps 1 through 7 at least 10 times every 1-2 hours when you are awake. Take your time and take a few normal breaths between deep breaths. 9. The spirometer may include an indicator to show your best effort. Use the indicator as a goal to work toward during each repetition. 10. After each set of 10 deep breaths, practice coughing to be sure your lungs are clear. If you have an incision (the  cut made at the time of surgery), support your incision when coughing by placing a pillow or rolled up towels firmly against it. Once you are able to get out of bed, walk around indoors and cough well. You may stop using the incentive spirometer when instructed by your caregiver.  RISKS AND COMPLICATIONS  Take your time so you do not get dizzy or light-headed.  If you are in pain, you may need to take or ask for pain medication before doing incentive spirometry. It is harder to take a deep breath if you are having pain. AFTER USE  Rest and breathe slowly and easily.  It can be helpful to keep track of a log of your progress. Your caregiver can provide you with a simple table to help  with this. If you are using the spirometer at home, follow these instructions: [place] IF:   You are having difficultly using the spirometer.  You have trouble using the spirometer as often as instructed.  Your pain medication is not giving enough relief while using the spirometer.  You develop fever of 100.5 F (38.1 C) or higher. SEEK IMMEDIATE MEDICAL CARE IF:   You cough up bloody sputum that had not been present before.  You develop fever of 102 F (38.9 C) or greater.  You develop worsening pain at or near the incision site. MAKE SURE YOU:   Understand these instructions.  Will watch your condition.  Will get help right away if you are not doing well or get worse. Document Released: 03/06/2007 Document Revised: 01/16/2012 Document Reviewed: 05/07/2007 [facility] Patient Information 2014 [place], [place].   ________________________________________________________________________

## 2021-01-29 ENCOUNTER — Encounter (HOSPITAL_COMMUNITY)
Admission: RE | Admit: 2021-01-29 | Discharge: 2021-01-29 | Disposition: A | Discharge status: Home / Self Care | Payer: Medicare Other | Source: Ambulatory Visit | Attending: Orthopedic Surgery | Admitting: Orthopedic Surgery

## 2021-01-29 ENCOUNTER — Other Ambulatory Visit: Payer: Self-pay

## 2021-01-29 ENCOUNTER — Encounter (HOSPITAL_COMMUNITY): Payer: Self-pay

## 2021-01-29 ENCOUNTER — Ambulatory Visit (HOSPITAL_COMMUNITY)
Admission: RE | Admit: 2021-01-29 | Discharge: 2021-01-29 | Disposition: A | Discharge status: Home / Self Care | Payer: Medicare Other | Source: Ambulatory Visit | Attending: Orthopedic Surgery | Admitting: Orthopedic Surgery

## 2021-01-29 DIAGNOSIS — Z01811 Encounter for preprocedural respiratory examination: Secondary | ICD-10-CM

## 2021-01-29 DIAGNOSIS — Z01818 Encounter for other preprocedural examination: Secondary | ICD-10-CM | POA: Insufficient documentation

## 2021-01-29 LAB — SURGICAL PCR SCREEN
MRSA, PCR: NEGATIVE
Staphylococcus aureus: NEGATIVE

## 2021-01-29 LAB — CBC WITH DIFFERENTIAL/PLATELET
Abs Immature Granulocytes: 0.03 10*3/uL (ref 0.00–0.07)
Basophils Absolute: 0 10*3/uL (ref 0.0–0.1)
Basophils Relative: 1 %
Eosinophils Absolute: 0.3 10*3/uL (ref 0.0–0.5)
Eosinophils Relative: 5 %
HCT: 39.9 % (ref 36.0–46.0)
Hemoglobin: 12.7 g/dL (ref 12.0–15.0)
Immature Granulocytes: 1 %
Lymphocytes Relative: 31 %
Lymphs Abs: 2 10*3/uL (ref 0.7–4.0)
MCH: 29.3 pg (ref 26.0–34.0)
MCHC: 31.8 g/dL (ref 30.0–36.0)
MCV: 91.9 fL (ref 80.0–100.0)
Monocytes Absolute: 0.5 10*3/uL (ref 0.1–1.0)
Monocytes Relative: 8 %
Neutro Abs: 3.5 10*3/uL (ref 1.7–7.7)
Neutrophils Relative %: 54 %
Platelets: 232 10*3/uL (ref 150–400)
RBC: 4.34 MIL/uL (ref 3.87–5.11)
RDW: 13.2 % (ref 11.5–15.5)
WBC: 6.4 10*3/uL (ref 4.0–10.5)
nRBC: 0 % (ref 0.0–0.2)

## 2021-01-29 LAB — URINALYSIS, ROUTINE W REFLEX MICROSCOPIC
Bilirubin Urine: NEGATIVE
Glucose, UA: NEGATIVE mg/dL
Hgb urine dipstick: NEGATIVE
Ketones, ur: NEGATIVE mg/dL
Leukocytes,Ua: NEGATIVE
Nitrite: NEGATIVE
Protein, ur: NEGATIVE mg/dL
Specific Gravity, Urine: 1.004 — ABNORMAL LOW (ref 1.005–1.030)
pH: 7 (ref 5.0–8.0)

## 2021-01-29 LAB — COMPREHENSIVE METABOLIC PANEL
ALT: 28 U/L (ref 0–44)
AST: 28 U/L (ref 15–41)
Albumin: 4.4 g/dL (ref 3.5–5.0)
Alkaline Phosphatase: 76 U/L (ref 38–126)
Anion gap: 8 (ref 5–15)
BUN: 7 mg/dL — ABNORMAL LOW (ref 8–23)
CO2: 28 mmol/L (ref 22–32)
Calcium: 9.5 mg/dL (ref 8.9–10.3)
Chloride: 101 mmol/L (ref 98–111)
Creatinine, Ser: 0.67 mg/dL (ref 0.44–1.00)
GFR, Estimated: >60 mL/min (ref >60)
Glucose, Bld: 100 mg/dL — ABNORMAL HIGH (ref 70–99)
Potassium: 3.9 mmol/L (ref 3.5–5.1)
Sodium: 137 mmol/L (ref 135–145)
Total Bilirubin: 0.6 mg/dL (ref 0.3–1.2)
Total Protein: 7.1 g/dL (ref 6.5–8.1)

## 2021-01-29 LAB — APTT: aPTT: 32 seconds (ref 24–36)

## 2021-01-29 NOTE — Progress Notes (Signed)
COVID Vaccine Completed:yes Date COVID Vaccine completed:12/31/19-booster 10/21/20 COVID vaccine manufacturer:   Moderna   PCP - Dr. Jordan Hawks Cardiologist - none   Chest x-ray - no EKG - no Stress Test - no ECHO - no Cardiac Cath - no Pacemaker/ICD device last checked:NA  Sleep Study - no CPAP -   Fasting Blood Sugar - no Checks Blood Sugar _____ times a day  Blood Thinner Instructions:NA Aspirin Instructions: Last Dose:  Anesthesia review:   Patient denies shortness of breath, fever, cough and chest pain at PAT appointment  Yes Patient verbalized understanding of instructions that were given to them at the PAT appointment. Patient was also instructed that they will need to review over the PAT instructions again at home before surgery.yes  Pt Reports no SOB with any activities.

## 2021-02-01 ENCOUNTER — Other Ambulatory Visit (HOSPITAL_COMMUNITY): Payer: Medicare Other

## 2021-02-01 ENCOUNTER — Other Ambulatory Visit (HOSPITAL_COMMUNITY)
Admission: RE | Admit: 2021-02-01 | Discharge: 2021-02-01 | Disposition: A | Discharge status: Home / Self Care | Payer: Medicare Other | Source: Ambulatory Visit | Attending: Orthopedic Surgery | Admitting: Orthopedic Surgery

## 2021-02-01 DIAGNOSIS — Z20822 Contact with and (suspected) exposure to covid-19: Secondary | ICD-10-CM | POA: Insufficient documentation

## 2021-02-01 DIAGNOSIS — Z01812 Encounter for preprocedural laboratory examination: Secondary | ICD-10-CM | POA: Insufficient documentation

## 2021-02-02 LAB — SARS CORONAVIRUS 2 (TAT 6-24 HRS): SARS Coronavirus 2: NEGATIVE

## 2021-02-03 ENCOUNTER — Encounter (HOSPITAL_COMMUNITY): Payer: Self-pay | Admitting: Orthopedic Surgery

## 2021-02-04 ENCOUNTER — Ambulatory Visit (HOSPITAL_COMMUNITY)
Admission: RE | Admit: 2021-02-04 | Discharge: 2021-02-04 | Disposition: A | Discharge status: Home / Self Care | Payer: Medicare Other | Source: Ambulatory Visit | Attending: Orthopedic Surgery | Admitting: Orthopedic Surgery

## 2021-02-04 ENCOUNTER — Ambulatory Visit (HOSPITAL_COMMUNITY): Payer: Medicare Other

## 2021-02-04 ENCOUNTER — Encounter (HOSPITAL_COMMUNITY)
Admission: RE | Disposition: A | Discharge status: Home / Self Care | Payer: Self-pay | Source: Ambulatory Visit | Attending: Orthopedic Surgery

## 2021-02-04 ENCOUNTER — Ambulatory Visit (HOSPITAL_COMMUNITY): Payer: Medicare Other | Admitting: Anesthesiology

## 2021-02-04 ENCOUNTER — Encounter (HOSPITAL_COMMUNITY): Payer: Self-pay | Admitting: Orthopedic Surgery

## 2021-02-04 DIAGNOSIS — M75102 Unspecified rotator cuff tear or rupture of left shoulder, not specified as traumatic: Secondary | ICD-10-CM | POA: Diagnosis not present

## 2021-02-04 DIAGNOSIS — Z96612 Presence of left artificial shoulder joint: Secondary | ICD-10-CM

## 2021-02-04 DIAGNOSIS — Z79899 Other long term (current) drug therapy: Secondary | ICD-10-CM | POA: Diagnosis not present

## 2021-02-04 DIAGNOSIS — M19012 Primary osteoarthritis, left shoulder: Secondary | ICD-10-CM | POA: Insufficient documentation

## 2021-02-04 HISTORY — PX: REVERSE SHOULDER ARTHROPLASTY: SHX5054

## 2021-02-04 LAB — TYPE AND SCREEN
ABO/RH(D): O POS
Antibody Screen: NEGATIVE

## 2021-02-04 SURGERY — ARTHROPLASTY, SHOULDER, TOTAL, REVERSE
Anesthesia: General | Site: Shoulder | Laterality: Left

## 2021-02-04 MED ORDER — ONDANSETRON HCL 4 MG/2ML IJ SOLN: 4.0000 mg | Freq: Once | INTRAMUSCULAR | Status: DC | PRN

## 2021-02-04 MED ORDER — LIDOCAINE 2% (20 MG/ML) 5 ML SYRINGE
INTRAMUSCULAR | Status: DC | PRN
  Administered 2021-02-04: 80 mg via INTRAVENOUS

## 2021-02-04 MED ORDER — MIDAZOLAM HCL 2 MG/2ML IJ SOLN
INTRAMUSCULAR | Status: AC
  Administered 2021-02-04: 2 mg via INTRAVENOUS
  Filled 2021-02-04: qty 2

## 2021-02-04 MED ORDER — HYDROMORPHONE HCL 1 MG/ML IJ SOLN
0.2500 mg | INTRAMUSCULAR | Status: DC | PRN
  Administered 2021-02-04 (×2): 0.25 mg via INTRAVENOUS

## 2021-02-04 MED ORDER — ACETAMINOPHEN 10 MG/ML IV SOLN
INTRAVENOUS | Status: AC
  Filled 2021-02-04: qty 100

## 2021-02-04 MED ORDER — DEXAMETHASONE SODIUM PHOSPHATE 10 MG/ML IJ SOLN
INTRAMUSCULAR | Status: DC | PRN
  Administered 2021-02-04: 4 mg via INTRAVENOUS

## 2021-02-04 MED ORDER — OXYCODONE HCL 5 MG PO TABS: 5.0000 mg | ORAL_TABLET | Freq: Once | ORAL | Status: AC | PRN

## 2021-02-04 MED ORDER — FENTANYL CITRATE (PF) 100 MCG/2ML IJ SOLN: 50.0000 ug | INTRAMUSCULAR | Status: DC

## 2021-02-04 MED ORDER — FENTANYL CITRATE (PF) 100 MCG/2ML IJ SOLN
INTRAMUSCULAR | Status: AC
  Administered 2021-02-04: 50 ug via INTRAVENOUS
  Filled 2021-02-04: qty 2

## 2021-02-04 MED ORDER — MIDAZOLAM HCL 2 MG/2ML IJ SOLN: 1.0000 mg | INTRAMUSCULAR | Status: DC

## 2021-02-04 MED ORDER — SODIUM CHLORIDE 0.9 % IR SOLN
Status: DC | PRN
  Administered 2021-02-04: 1000 mL

## 2021-02-04 MED ORDER — 0.9 % SODIUM CHLORIDE (POUR BTL) OPTIME
TOPICAL | Status: DC | PRN
  Administered 2021-02-04: 1000 mL

## 2021-02-04 MED ORDER — SUGAMMADEX SODIUM 200 MG/2ML IV SOLN
INTRAVENOUS | Status: DC | PRN
  Administered 2021-02-04: 200 mg via INTRAVENOUS

## 2021-02-04 MED ORDER — ROCURONIUM BROMIDE 10 MG/ML (PF) SYRINGE
PREFILLED_SYRINGE | INTRAVENOUS | Status: DC | PRN
  Administered 2021-02-04: 60 mg via INTRAVENOUS

## 2021-02-04 MED ORDER — CHLORHEXIDINE GLUCONATE 0.12 % MT SOLN
15.0000 mL | Freq: Once | OROMUCOSAL | Status: AC
  Administered 2021-02-04: 15 mL via OROMUCOSAL

## 2021-02-04 MED ORDER — HYDROMORPHONE HCL 1 MG/ML IJ SOLN
INTRAMUSCULAR | Status: AC
  Administered 2021-02-04: 0.5 mg via INTRAVENOUS
  Filled 2021-02-04: qty 1

## 2021-02-04 MED ORDER — OXYCODONE HCL 5 MG PO TABS
ORAL_TABLET | ORAL | Status: AC
  Administered 2021-02-04: 5 mg via ORAL
  Filled 2021-02-04: qty 1

## 2021-02-04 MED ORDER — ORAL CARE MOUTH RINSE: 15.0000 mL | Freq: Once | OROMUCOSAL | Status: AC

## 2021-02-04 MED ORDER — ACETAMINOPHEN 10 MG/ML IV SOLN
1000.0000 mg | Freq: Once | INTRAVENOUS | Status: DC | PRN
Start: 2021-02-04 — End: 2021-02-05
  Administered 2021-02-04: 1000 mg via INTRAVENOUS

## 2021-02-04 MED ORDER — BUPIVACAINE LIPOSOME 1.3 % IJ SUSP
INTRAMUSCULAR | Status: DC | PRN
  Administered 2021-02-04: 10 mL via PERINEURAL

## 2021-02-04 MED ORDER — ONDANSETRON HCL 4 MG/2ML IJ SOLN
INTRAMUSCULAR | Status: DC | PRN
  Administered 2021-02-04: 4 mg via INTRAVENOUS

## 2021-02-04 MED ORDER — OXYCODONE HCL 5 MG/5ML PO SOLN: 5.0000 mg | Freq: Once | ORAL | Status: AC | PRN

## 2021-02-04 MED ORDER — LACTATED RINGERS IV SOLN: INTRAVENOUS | Status: DC

## 2021-02-04 MED ORDER — WATER FOR IRRIGATION, STERILE IR SOLN
Status: DC | PRN
  Administered 2021-02-04: 1000 mL

## 2021-02-04 MED ORDER — HYDROCODONE-ACETAMINOPHEN 5-325 MG PO TABS
1.0000 | ORAL_TABLET | ORAL | 0 refills | Status: DC | PRN
Start: 2021-02-04 — End: 2021-12-06

## 2021-02-04 MED ORDER — TIZANIDINE HCL 4 MG PO TABS: 4.0000 mg | ORAL_TABLET | Freq: Three times a day (TID) | ORAL | 1 refills | Status: DC | PRN

## 2021-02-04 MED ORDER — PROPOFOL 10 MG/ML IV BOLUS
INTRAVENOUS | Status: DC | PRN
  Administered 2021-02-04: 150 mg via INTRAVENOUS

## 2021-02-04 MED ORDER — TRANEXAMIC ACID-NACL 1000-0.7 MG/100ML-% IV SOLN
1000.0000 mg | INTRAVENOUS | Status: AC
  Administered 2021-02-04: 1000 mg via INTRAVENOUS
  Filled 2021-02-04: qty 100

## 2021-02-04 MED ORDER — PHENYLEPHRINE HCL-NACL 10-0.9 MG/250ML-% IV SOLN
INTRAVENOUS | Status: DC | PRN
  Administered 2021-02-04: 40 ug/min via INTRAVENOUS

## 2021-02-04 MED ORDER — CEFAZOLIN SODIUM-DEXTROSE 2-4 GM/100ML-% IV SOLN
2.0000 g | INTRAVENOUS | Status: AC
  Administered 2021-02-04: 2 g via INTRAVENOUS
  Filled 2021-02-04: qty 100

## 2021-02-04 MED ORDER — BUPIVACAINE HCL (PF) 0.5 % IJ SOLN
INTRAMUSCULAR | Status: DC | PRN
  Administered 2021-02-04: 15 mL via PERINEURAL

## 2021-02-04 SURGICAL SUPPLY — 71 items
BAG ZIPLOCK 12X15 (MISCELLANEOUS) ×2 IMPLANT
BASEPLATE P2 COATD GLND 6.5X30 (Shoulder) ×1 IMPLANT
BIT DRILL 1.6MX128 (BIT) IMPLANT
BIT DRILL 2.5 DIA 127 CALI (BIT) ×2 IMPLANT
BIT DRILL 4 DIA CALIBRATED (BIT) ×2 IMPLANT
BLADE SAW SAG 73X25 THK (BLADE) ×1
BLADE SAW SGTL 73X25 THK (BLADE) ×1 IMPLANT
BOOTIES KNEE HIGH SLOAN (MISCELLANEOUS) ×4 IMPLANT
COOLER ICEMAN CLASSIC (MISCELLANEOUS) ×2 IMPLANT
COVER BACK TABLE 60X90IN (DRAPES) ×2 IMPLANT
COVER SURGICAL LIGHT HANDLE (MISCELLANEOUS) ×2 IMPLANT
COVER WAND RF STERILE (DRAPES) IMPLANT
DRAPE INCISE IOBAN 66X45 STRL (DRAPES) ×2 IMPLANT
DRAPE ORTHO SPLIT 77X108 STRL (DRAPES) ×2
DRAPE POUCH INSTRU U-SHP 10X18 (DRAPES) ×2 IMPLANT
DRAPE SHEET LG 3/4 BI-LAMINATE (DRAPES) ×2 IMPLANT
DRAPE SURG 17X11 SM STRL (DRAPES) ×2 IMPLANT
DRAPE SURG ORHT 6 SPLT 77X108 (DRAPES) ×2 IMPLANT
DRAPE TOP 10253 STERILE (DRAPES) ×2 IMPLANT
DRAPE U-SHAPE 47X51 STRL (DRAPES) ×2 IMPLANT
DRSG AQUACEL AG ADV 3.5X 6 (GAUZE/BANDAGES/DRESSINGS) ×2 IMPLANT
DURAPREP 26ML APPLICATOR (WOUND CARE) ×4 IMPLANT
ELECT BLADE TIP CTD 4 INCH (ELECTRODE) ×2 IMPLANT
ELECT REM PT RETURN 15FT ADLT (MISCELLANEOUS) ×2 IMPLANT
GLOVE SRG 8 PF TXTR STRL LF DI (GLOVE) ×1 IMPLANT
GLOVE SURG ENC MOIS LTX SZ7 (GLOVE) ×2 IMPLANT
GLOVE SURG ENC MOIS LTX SZ7.5 (GLOVE) ×2 IMPLANT
GLOVE SURG UNDER POLY LF SZ7 (GLOVE) ×2 IMPLANT
GLOVE SURG UNDER POLY LF SZ8 (GLOVE) ×1
GOWN STRL REUS W/TWL LRG LVL3 (GOWN DISPOSABLE) ×2 IMPLANT
GOWN STRL REUS W/TWL XL LVL3 (GOWN DISPOSABLE) ×2 IMPLANT
HANDPIECE INTERPULSE COAX TIP (DISPOSABLE) ×1
HOOD PEEL AWAY FLYTE STAYCOOL (MISCELLANEOUS) ×8 IMPLANT
INSERT SMALL SOCKET 32MM NEU (Insert) ×2 IMPLANT
KIT BASIN OR (CUSTOM PROCEDURE TRAY) ×2 IMPLANT
KIT TURNOVER KIT A (KITS) ×2 IMPLANT
MANIFOLD NEPTUNE II (INSTRUMENTS) ×2 IMPLANT
NEEDLE TROCAR POINT SZ 2 1/2 (NEEDLE) IMPLANT
NS IRRIG 1000ML POUR BTL (IV SOLUTION) ×2 IMPLANT
P2 COATDE GLNOID BSEPLT 6.5X30 (Shoulder) ×2 IMPLANT
PACK SHOULDER (CUSTOM PROCEDURE TRAY) ×2 IMPLANT
PAD COLD SHLDR WRAP-ON (PAD) ×2 IMPLANT
PROTECTOR NERVE ULNAR (MISCELLANEOUS) ×2 IMPLANT
RESTRAINT HEAD UNIVERSAL NS (MISCELLANEOUS) IMPLANT
RETRIEVER SUT HEWSON (MISCELLANEOUS) IMPLANT
SCREW BONE LOCKING RSP 5.0X14 (Screw) ×4 IMPLANT
SCREW BONE LOCKING RSP 5.0X30 (Screw) ×4 IMPLANT
SCREW BONE RSP LOCK 5X14 (Screw) ×2 IMPLANT
SCREW BONE RSP LOCK 5X30 (Screw) ×2 IMPLANT
SCREW RETAIN W/HEAD 4MM OFFSET (Shoulder) ×2 IMPLANT
SET HNDPC FAN SPRY TIP SCT (DISPOSABLE) ×1 IMPLANT
SLING ARM IMMOBILIZER LRG (SOFTGOODS) IMPLANT
SLING ARM IMMOBILIZER MED (SOFTGOODS) ×2 IMPLANT
SPONGE LAP 18X18 RF (DISPOSABLE) ×2 IMPLANT
STEM HUMERAL SM SHELL 12X48 (Stem) ×1 IMPLANT
STEM HUMERAL SMALL SHELL 12X48 (Stem) ×2 IMPLANT
STRIP CLOSURE SKIN 1/2X4 (GAUZE/BANDAGES/DRESSINGS) ×4 IMPLANT
SUCTION FRAZIER HANDLE 10FR (MISCELLANEOUS) ×1
SUCTION TUBE FRAZIER 10FR DISP (MISCELLANEOUS) ×1 IMPLANT
SUPPORT WRAP ARM LG (MISCELLANEOUS) ×2 IMPLANT
SUT ETHIBOND 2 V 37 (SUTURE) IMPLANT
SUT FIBERWIRE #2 38 REV NDL BL (SUTURE)
SUT MNCRL AB 4-0 PS2 18 (SUTURE) ×2 IMPLANT
SUT VIC AB 2-0 CT1 27 (SUTURE) ×2
SUT VIC AB 2-0 CT1 TAPERPNT 27 (SUTURE) ×2 IMPLANT
SUTURE FIBERWR#2 38 REV NDL BL (SUTURE) IMPLANT
TAPE LABRALWHITE 1.5X36 (TAPE) IMPLANT
TAPE SUT LABRALTAP WHT/BLK (SUTURE) IMPLANT
TOWEL OR 17X26 10 PK STRL BLUE (TOWEL DISPOSABLE) ×2 IMPLANT
TOWEL OR NON WOVEN STRL DISP B (DISPOSABLE) ×2 IMPLANT
WATER STERILE IRR 1000ML POUR (IV SOLUTION) ×2 IMPLANT

## 2021-02-04 NOTE — Anesthesia Postprocedure Evaluation (Signed)
Anesthesia Post Note  Patient: Tanya Duncan  Procedure(s) Performed: REVERSE SHOULDER ARTHROPLASTY WITH HARDWARE REMOVAL (Left Shoulder)     Patient location during evaluation: PACU Anesthesia Type: General Level of consciousness: awake and alert Pain management: pain level controlled Vital Signs Assessment: post-procedure vital signs reviewed and stable Respiratory status: spontaneous breathing, nonlabored ventilation, respiratory function stable and patient connected to nasal cannula oxygen Cardiovascular status: blood pressure returned to baseline and stable Postop Assessment: no apparent nausea or vomiting Anesthetic complications: no   No complications documented.  Last Vitals:  Vitals:   02/04/21 1200 02/04/21 1215  BP: 127/84 111/73  Pulse: 85 88  Resp: 13 19  Temp:    SpO2: 92% 93%    Last Pain:  Vitals:   02/04/21 1215  TempSrc:   PainSc: 6                  Abdirahman Chittum S

## 2021-02-04 NOTE — Progress Notes (Signed)
Assisted Dr. Rose with left, ultrasound guided, interscalene  block. Side rails up, monitors on throughout procedure. See vital signs in flow sheet. Tolerated Procedure well.  

## 2021-02-04 NOTE — Discharge Instructions (Signed)
Interscalene Nerve Block, Care After This sheet gives you information about how to care for yourself after your procedure. Your health care provider may also give you more specific instructions. If you have problems or questions, contact your health care provider. What can I expect after the procedure? After the procedure, it is common to have:  Neck soreness or tenderness.  Numbness in your shoulder, upper arm, and some fingers.  Weakness in your shoulder and arm muscles. You may also have:  Voice changes.  A droopy eyelid.  Trouble swallowing.  A stuffy nose. Feeling and strength in your shoulder, arm, and fingers should return to normal within hours after your procedure or within a few days if you have a long, thin tube (nerve block catheter) in place to deliver medicine for the nerve block. Follow these instructions at home: Medicines  Take over-the-counter and prescription medicines only as told by your health care provider.  For the time period you were told by your health care provider, do not take sleeping pills or medicines that cause drowsiness.  Ask your health care provider if the medicine prescribed to you: ? Requires you to avoid driving or using machinery. ? Can cause constipation. You may need to take these actions to prevent or treat constipation:  Drink enough fluid to keep your urine pale yellow.  Take over-the-counter or prescription medicines.  Eat foods that are high in fiber, such as beans, whole grains, and fresh fruits and vegetables.  Limit foods that are high in fat and processed sugars, such as fried or sweet foods. Eating and drinking  Follow instructions from your health care provider about eating or drinking restrictions.  If you vomit, drink small sips of water, juice, or soup when you can drink without vomiting.  If you have trouble swallowing, take small bites when eating and small sips of water when drinking until you are able to swallow  normally.  Make sure you have little or no nausea before eating solid foods. If you have a sling:  Wear it as told by your health care provider. Remove it only as told by your health care provider.  Loosen it if your fingers tingle, become numb, or turn cold and blue.  Make sure that your entire arm, including your wrist, is supported. Do not let your wrist dangle over the end of the sling.  Keep it clean.  If the sling is not waterproof: ? Do not let it get wet. ? Cover it with a watertight covering when you take a bath or shower. Bathing  Do not take baths, swim, or use a hot tub until your health care provider approves. Ask your health care provider if you may take showers. You may only be allowed to take sponge baths.  If you have a nerve block catheter in place, keep the injection site and tubing dry. Injection site care  Follow instructions from your health care provider about how to take care of your nerve block injection site. Make sure you: ? Wash your hands with soap and water for at least 20 seconds before and after you change your bandage (dressing). If soap and water are not available, use hand sanitizer. ? Change your dressing as told by your health care provider.  Keep your dressing dry.  Check your injection site every day for signs of infection. Check for: ? Redness, swelling, or pain. ? Fluid or blood. ? Warmth. ? Pus or a bad smell.   Activity  For the  time period you were told by your health care provider: ? Do not participate in activities where you could fall or become injured. ? Do not drink alcohol. ? Do not make important decisions or sign legal documents. ? Do not take care of children on your own.  Do not lift anything that is heavier than 10 lb (4.5 kg), or the limit that you are told, until your health care provider says that it is safe.  Do not drive or use machinery until you have fully recovered. Ask your health care provider when it is safe  to drive.  Rest as told by your health care provider. This will help you recover faster.  Be very careful until you have regained strength and feeling.  Return to your normal activities as told by your health care provider. Ask your health care provider what activities are safe for you.   General instructions  Have a responsible adult stay with you until the feeling in your shoulder, arm, and fingers returns to normal.  Do not use any products that contain nicotine or tobacco. These products include cigarettes, chewing tobacco, and vaping devices, such as e-cigarettes. These can delay healing. If you need help quitting, ask your health care provider.  Do not expose your arm or shoulder to very cold or very hot temperatures until you have full feeling back.  If you have a nerve block catheter in place: ? Try to keep the catheter from getting kinked or pinched. ? Avoid pulling or tugging on the catheter.  Keep all follow-up visits. This is important. Contact a health care provider if:  You have a fever or chills.  You have any of these signs of infection: ? Redness, swelling, or pain around your injection site. ? Fluid or blood coming from your injection site. ? Warmth coming from your injection site. ? Pus or a bad smell coming from your injection site.  You have hoarseness, a drooping eye, or dry eye that lasts more than a few days.  You have pain that is poorly controlled with the block or with pain medicine.  You have numbness, tingling, or weakness in your shoulder or arm that lasts for more than 1 week. Get help right away if you:  Have severe pain.  Lose or do not regain strength and feeling in your arm even after the nerve block medicine has stopped.  Have trouble breathing. These symptoms may represent a serious problem that is an emergency. Do not wait to see if the symptoms will go away. Get medical help right away. Call your local emergency services (911 in the  [place] Do not drive yourself to the hospital. Summary  After the procedure, it is common to have neck soreness, weakness in the shoulder and arm muscles, and numbness in the upper arm, shoulder, and some fingers.  Follow instructions from your health care provider about how to take care of your nerve block injection site.  Keep all follow-up visits. This information is not intended to replace advice given to you by your health care provider. Make sure you discuss any questions you have with your health care provider. Document Revised: 04/28/2020 Document Reviewed: 04/28/2020 Elsevier Patient Education  [place]. Discharge Instructions after Reverse Total Shoulder Arthroplasty   . A sling has been provided for you. You are to wear this at all times (except for bathing and dressing), until your first post operative visit with Dr. Tamera Punt. Please also wear while sleeping at night. While  you bath and dress, let the arm/elbow extend straight down to stretch your elbow. Wiggle your fingers and pump your first while your in the sling to prevent hand swelling. . Use ice on the shoulder intermittently over the first 48 hours after surgery. Continue to use ice or and ice machine as needed after 48 hours for pain control/swelling.  . Pain medicine has been prescribed for you.  . Use your medicine liberally over the first 48 hours, and then you can begin to taper your use. You may take Extra Strength Tylenol or Tylenol only in place of the pain pills. DO NOT take ANY nonsteroidal anti-inflammatory pain medications: Advil, Motrin, Ibuprofen, Aleve, Naproxen or Naprosyn.  . Take one aspirin a day for 2 weeks after surgery, unless you have an aspirin sensitivity/allergy or asthma.  . Leave your dressing on until your first follow up visit.  You may shower with the dressing.  Hold your arm as if you still have your sling on while you shower. . Simply allow the water to wash over the site and then pat  dry. Make sure your axilla (armpit) is completely dry after showering.    Please call [phone] during normal business hours or [phone] after hours for any problems. Including the following:  - excessive redness of the incisions - drainage for more than 4 days - fever of more than 101.5 F  *Please note that pain medications will not be refilled after hours or on weekends.  Dental Antibiotics:  In most cases prophylactic antibiotics for Dental procdeures after total joint surgery are not necessary.  Exceptions are as follows:  1. History of prior total joint infection  2. Severely immunocompromised (Organ Transplant, cancer chemotherapy, Rheumatoid biologic meds such as Humera)  3. Poorly controlled diabetes (A1C &gt; 8.0, blood glucose over 200)   CONSTIPATION  Constipation is defined medically as fewer than three stools per week and severe constipation as less than one stool per week.  Even if you have a regular bowel pattern at home, your normal regimen is likely to be disrupted due to multiple reasons following surgery.  Combination of anesthesia, postoperative narcotics, change in appetite and fluid intake all can affect your bowels.   YOU MUST use at least one of the following options; they are listed in order of increasing strength to get the job done.  They are all available over the counter, and you may need to use some, POSSIBLY even all of these options:    Drink plenty of fluids (prune juice may be helpful) and high fiber foods Colace 100 mg by mouth twice a day  Senokot for constipation as directed and as needed Dulcolax (bisacodyl), take with full glass of water  Miralax (polyethylene glycol) once or twice a day as needed.  If you have tried all these things and are unable to have a bowel movement in the first 3-4 days after surgery call either your surgeon or your primary doctor.    If you experience loose stools or diarrhea, hold the medications until you  stool forms back up.  If your symptoms do not get better within 1 week or if they get worse, check with your doctor.  If you experience "the worst abdominal pain ever" or develop nausea or vomiting, please contact the office immediately for further recommendations for treatment.   ITCHING:  If you experience itching with your medications, try taking only a single pain pill, or even half a pain pill at a time.  You can also use Benadryl over the counter for itching or also to help with sleep.    PRECAUTIONS:  If you experience chest pain or shortness of breath - call 911 immediately for transfer to the hospital emergency department.   If you develop a fever greater that 101 F, purulent drainage from wound, increased redness or drainage from wound, foul odor from the wound/dressing, or calf pain - CONTACT YOUR SURGEON.                                                   FOLLOW-UP APPOINTMENTS:  If you do not already have a post-op appointment, please call the office for an appointment to be seen by your surgeon.  Guidelines for how soon to be seen are listed in your "After Visit Summary", but are typically between 1-4 weeks after surgery.  Thank you for letting us be a part of your medical care team.  It is a privilege we respect greatly.  We hope these instructions will help you stay on track for a fast and full recovery!

## 2021-02-04 NOTE — H&P (Signed)
Tanya Duncan is an 75 y.o. female.   Chief Complaint: L shoulder pain and dysfunction HPI: s/p ORIF proximal humerus fracture with progressive rotator cuff tear arthopathy.  Past Medical History:  Diagnosis Date  . Anxiety   . Arthritis   . Blood transfusion without reported diagnosis 05/2019   with hip surgery  . Fibromyalgia   . Fractured pelvis (HCC) 09/15/2020   right  . GERD (gastroesophageal reflux disease)   . GI bleed 2536,6440   x2  . Hyperlipidemia   . Pneumonia 2005  . Stomach ulcer 1990    Past Surgical History:  Procedure Laterality Date  . ABDOMINAL HYSTERECTOMY    . FRACTURE SURGERY Right 1998   ankle  . JOINT REPLACEMENT    . LUMBAR FUSION  2015  . OPEN REDUCTION INTERNAL FIXATION (ORIF) PROXIMAL PHALANX Right 09/21/2020   Procedure: OPEN TREATMENT OF RIGHT LONG FINGER PROXIMAL PHALANX FRACTURE;  Surgeon: Mack Hook, MD;  Location[facility];  Service: Orthopedics;  Laterality: Right;  LENGTH OF SURGERY: 75 MIN  . ORIF HUMERUS FRACTURE Left   . ORIF TIBIA FRACTURE Right 1998   with bone graft  . PYLOROPLASTY    . TOTAL HIP ARTHROPLASTY  2011  . TOTAL HIP ARTHROPLASTY Right 05/14/2019   Procedure: Right Anterior Hip Arthroplasty;  Surgeon: Marcene Corning, MD;  Location: [facility] ORS;  Service: Orthopedics;  Laterality: Right;  . TOTAL SHOULDER REPLACEMENT  2014    Family History  Problem Relation Age of Onset  . Colon cancer Father 73   Social History:  reports that she has never smoked. She has never used smokeless tobacco. She reports current alcohol use. She reports that she does not use drugs.  Allergies:  Allergies  Allergen Reactions  . Atorvastatin Diarrhea  . Ibuprofen Other (See Comments)    Has had GI bleeds x2 in past  . Earnestine Leys Officinalis] Other (See Comments)    Heart races, flu-like symptoms   . Sulfa Antibiotics Nausea And Vomiting  . Avelox [Moxifloxacin Hcl In Nacl] Palpitations  . Indocin [Indomethacin] Other (See Comments)     Dizziness    Medications Prior to Admission  Medication Sig Dispense Refill  . acetaminophen (TYLENOL) 500 MG tablet Take 1,000 mg by mouth every 6 (six) hours as needed for moderate pain or headache.     . Ascorbic Acid (VITAMIN C) 1000 MG tablet Take 1,000 mg by mouth daily.     Marland Kitchen b complex vitamins tablet Take 1 tablet by mouth daily.    Marland Kitchen bismuth subsalicylate (PEPTO BISMOL) 262 MG/15ML suspension Take 30 mLs by mouth every 6 (six) hours as needed (upset stomach).    Marland Kitchen buPROPion (WELLBUTRIN XL) 150 MG 24 hr tablet Take 150 mg by mouth daily.     . Calcium Polycarbophil (FIBER-CAPS PO) Take 3 tablets by mouth daily.    . diclofenac Sodium (VOLTAREN) 1 % GEL Place 1 application onto the skin 4 (four) times daily as needed (pain).    Marland Kitchen ergocalciferol (VITAMIN D2) 1.25 MG (50000 UT) capsule Take 50,000 Units by mouth every Monday.     . escitalopram (LEXAPRO) 20 MG tablet Take 20 mg by mouth daily.    . Ferrous Sulfate (IRON PO) Take 1 tablet by mouth daily as needed (low iron).    . gabapentin (NEURONTIN) 300 MG capsule Take 600 mg by mouth 2 (two) times daily.    Marland Kitchen HYDROcodone-acetaminophen (NORCO/VICODIN) 5-325 MG tablet Take 1 tablet by mouth 2 (two) times daily as  needed for pain.    Marland Kitchen LORazepam (ATIVAN) 1 MG tablet Take 1 mg by mouth 2 (two) times daily.    . Melatonin 5 MG TABS Take 5 mg by mouth at bedtime as needed (Sleep).    . methocarbamol (ROBAXIN) 500 MG tablet Take 500 mg by mouth every 6 (six) hours as needed for muscle spasms.     . Multiple Vitamin (MULTIVITAMIN WITH MINERALS) TABS tablet Take 1 tablet by mouth daily.    . nortriptyline (PAMELOR) 25 MG capsule Take 100 mg by mouth at bedtime.     . pantoprazole (PROTONIX) 40 MG tablet Take 1 tablet (40 mg total) by mouth 2 (two) times daily. 90 tablet 3  . pravastatin (PRAVACHOL) 80 MG tablet Take 80 mg by mouth daily.    . Psyllium (DAILY FIBER PO) Take 3-4 capsules by mouth daily.    . sucralfate (CARAFATE) 1 g tablet  Take 1-2 g by mouth once a week.    . traMADol (ULTRAM) 50 MG tablet Take 50-100 mg by mouth 4 (four) times daily as needed for severe pain or moderate pain.    Marland Kitchen zolpidem (AMBIEN) 10 MG tablet Take 10 mg by mouth at bedtime as needed for sleep. Must be Torrent brand      No results found for this or any previous visit (from the past 48 hour(s)). No results found.  Review of Systems  All other systems reviewed and are negative.   Blood pressure 118/73, pulse 87, temperature 98.4 F (36.9 C), temperature source Oral, resp. rate 14, height 5\' 3"  (1.6 m), weight 70.8 kg, SpO2 98 %. Physical Exam HENT:     Head: Atraumatic.  Eyes:     Extraocular Movements: Extraocular movements intact.  Cardiovascular:     Pulses: Normal pulses.  Musculoskeletal:     Comments: L shoulder pain with limited ROM. NVID.  Neurological:     Mental Status: She is alert.  Psychiatric:        Mood and Affect: Mood normal.      Assessment/Plan s/p ORIF proximal humerus fracture with progressive rotator cuff tear arthopathy. Plan L reverse TSA with hardware removal Risks / benefits of surgery discussed Consent on chart  NPO for OR Preop antibiotics   , MD 02/04/2021, 9:00 AM

## 2021-02-04 NOTE — Anesthesia Procedure Notes (Signed)
Anesthesia Regional Block: Interscalene brachial plexus block   Pre-Anesthetic Checklist: ,, timeout performed, Correct Patient, Correct Site, Correct Laterality, Correct Procedure, Correct Position, site marked, Risks and benefits discussed,  Surgical consent,  Pre-op evaluation,  At surgeon's request and post-op pain management  Laterality: Left  Prep: chloraprep       Needles:  Injection technique: Single-shot  Needle Type: Echogenic Needle     Needle Length: 9cm      Additional Needles:   Procedures:,,,, ultrasound used (permanent image in chart),,,,  Narrative:  Start time: 02/04/2021 8:57 AM End time: 02/04/2021 9:04 AM Injection made incrementally with aspirations every 5 mL.  Performed by: Personally  Anesthesiologist: Eilene Ghazi, MD  Additional Notes: Patient tolerated the procedure well without complications

## 2021-02-04 NOTE — Anesthesia Preprocedure Evaluation (Signed)
Anesthesia Evaluation  Patient identified by MRN, date of birth, ID band Patient awake    Reviewed: Allergy & Precautions, NPO status , Patient's Chart, lab work & pertinent test results  Airway Mallampati: II  TM Distance: >3 FB Neck ROM: Full    Dental no notable dental hx.    Pulmonary neg pulmonary ROS,    Pulmonary exam normal breath sounds clear to auscultation       Cardiovascular negative cardio ROS Normal cardiovascular exam Rhythm:Regular Rate:Normal     Neuro/Psych negative neurological ROS  negative psych ROS   GI/Hepatic Neg liver ROS, GERD  ,  Endo/Other  negative endocrine ROS  Renal/GU negative Renal ROS  negative genitourinary   Musculoskeletal  (+) Arthritis , Rheumatoid disorders,    Abdominal   Peds negative pediatric ROS (+)  Hematology negative hematology ROS (+)   Anesthesia Other Findings   Reproductive/Obstetrics negative OB ROS                             Anesthesia Physical Anesthesia Plan  ASA: II  Anesthesia Plan: General   Post-op Pain Management:  Regional for Post-op pain   Induction: Intravenous  PONV Risk Score and Plan: 3 and Ondansetron and Treatment may vary due to age or medical condition  Airway Management Planned: Oral ETT  Additional Equipment:   Intra-op Plan:   Post-operative Plan: Extubation in OR  Informed Consent: I have reviewed the patients History and Physical, chart, labs and discussed the procedure including the risks, benefits and alternatives for the proposed anesthesia with the patient or authorized representative who has indicated his/her understanding and acceptance.     Dental advisory given  Plan Discussed with: CRNA and Surgeon  Anesthesia Plan Comments:         Anesthesia Quick Evaluation

## 2021-02-04 NOTE — Anesthesia Procedure Notes (Signed)
Anesthesia Procedure Image    

## 2021-02-04 NOTE — Anesthesia Procedure Notes (Signed)
Procedure Name: Intubation Date/Time: 02/04/2021 10:15 AM Performed by: Lavina Hamman, CRNA Pre-anesthesia Checklist: Patient identified, Emergency Drugs available, Suction available, Patient being monitored and Timeout performed Patient Re-evaluated:Patient Re-evaluated prior to induction Oxygen Delivery Method: Circle system utilized Preoxygenation: Pre-oxygenation with 100% oxygen Induction Type: IV induction Ventilation: Mask ventilation without difficulty Laryngoscope Size: Mac and 3 Grade View: Grade I Tube type: Oral Tube size: 7.0 mm Number of attempts: 1 Airway Equipment and Method: Stylet Placement Confirmation: ETT inserted through vocal cords under direct vision,  positive ETCO2,  CO2 detector and breath sounds checked- equal and bilateral Secured at: 21 cm Tube secured with: Tape Dental Injury: Teeth and Oropharynx as per pre-operative assessment  Comments: ATOI

## 2021-02-04 NOTE — Op Note (Signed)
Procedure(s): REVERSE SHOULDER ARTHROPLASTY WITH HARDWARE REMOVAL Procedure Note  Savvy Peeters female 75 y.o. 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  She had a previous ORIF of a humerus fracture with retained hardware which needs to be removed to implant the reverse shoulder.  Pain and dysfunction interfered with quality of life and nonoperative treatment with activity modification, NSAIDS and injections failed.     Surgeon: Berline Lopes   Assistants: Damita Lack PA-C [facility] was present and scrubbed throughout the procedure and was essential in positioning, retraction, exposure, and closure)  Anesthesia: General endotracheal anesthesia with preoperative interscalene block given by the attending anesthesiologist     Procedure Detail  REVERSE SHOULDER ARTHROPLASTY WITH HARDWARE REMOVAL   Estimated Blood Loss:  200 mL         Drains: none  Blood Given: none          Specimens: none        Complications:  * No complications entered in OR log *         Disposition: PACU - hemodynamically stable.         Condition: stable      OPERATIVE FINDINGS:  A DJO Altivate pressfit reverse total shoulder arthroplasty was placed with a  size 12 stem, a 32-4 glenosphere, and a standard-mm poly insert. The base plate  fixation was excellent.  PROCEDURE: The patient was identified in the preoperative holding area  where I personally marked the operative site after verifying site, side,  and procedure with the patient. An interscalene block given by  the attending anesthesiologist in the holding area and the patient was taken back to  the operating room where all extremities were  carefully padded in position after general anesthesia was induced. She  was placed in a beach-chair position and the operative upper extremity was  prepped and draped in a standard sterile fashion. An approximately 10-  cm incision was made from the tip of the coracoid process to the center  point of the humerus at the level of the axilla. Dissection was carried  down through subcutaneous tissues to the level of the cephalic vein  which was taken laterally with the deltoid. The pectoralis major was  retracted medially. The subdeltoid space was developed and the lateral  edge of the conjoined tendon was identified. The undersurface of  conjoined tendon was palpated and the musculocutaneous nerve was not in  the field. Retractor was placed underneath the conjoined and second  retractor was placed lateral into the deltoid.  The lateral proximal plate was exposed and the proximal 3 screws were removed without difficulty.  The plate was left intact.   The circumflex humeral  artery and vessels were identified and clamped and coagulated. The  biceps tendon was tenotomized.  The subscapularis was taken down as a peel with the underlying capsule.  The  joint was then gently externally rotated while the capsule was released  from the humeral neck around to just beyond the 6 o'clock position. At  this point, the joint was dislocated and the humeral head was presented  into the wound. The excessive osteophyte formation was removed with a  large rongeur.  The cutting guide  was used to make the appropriate  head cut and the head was saved for potentially bone grafting.  The glenoid was exposed with the arm in an  abducted extended position. The anterior and posterior labrum were  completely excised and the capsule was released circumferentially to  allow for exposure of the glenoid for preparation. The 2.5 mm drill was  placed using the guide in 5-10  inferior angulation and the tap was then advanced in the same hole. Small and large reamers were then used. The tap was then removed and the Metaglene was then screwed in with excellent purchase.  The peripheral guide was then used to drilled measured and filled peripheral locking screws. The size 32-4 glenosphere was then impacted on the [facility] taper and the central screw was placed. The humerus was then again exposed and the diaphyseal reamers were used followed by the metaphyseal reamers. The final broach was left in place in the proximal trial was placed. The joint was reduced and with this implant it was felt that soft tissue tensioning was appropriate with excellent stability and excellent range of motion. Therefore, final humeral stem was placed press-fit with bone graft.  And then the trial polyethylene inserts were tested again and the above implant was felt to be the most appropriate for final insertion. The joint was reduced taken through full range of motion and felt to be stable. Soft tissue tension was appropriate.  The joint was then copiously irrigated with pulse  lavage and the wound was then closed. The subscapularis was not repaired.  Skin was closed with 2-0 Vicryl in a deep dermal layer and 4-0  Monocryl for skin closure. Steri-Strips were applied. Sterile  dressings were then applied as well as a sling. The patient was allowed  to awaken from general anesthesia, transferred to stretcher, and taken  to recovery room in stable condition.   POSTOPERATIVE PLAN: The patient will be observed in the recovery room.  If she is hemodynamically stable and has good pain control with her regional block I think she can go home today with family.

## 2021-02-04 NOTE — Transfer of Care (Signed)
Immediate Anesthesia Transfer of Care Note  Patient: Tanya Duncan  Procedure(s) Performed: Procedure(s): REVERSE SHOULDER ARTHROPLASTY WITH HARDWARE REMOVAL (Left)  Patient Location: PACU  Anesthesia Type:GA combined with regional for post-op pain  Level of Consciousness:  sedated, patient cooperative and responds to stimulation  Airway & Oxygen Therapy:Patient Spontanous Breathing and Patient connected to face mask oxgen  Post-op Assessment:  Report given to PACU RN and Post -op Vital signs reviewed and stable  Post vital signs:  Reviewed and stable  Last Vitals:  Vitals:   02/04/21 0905 02/04/21 1126  BP: 116/65 125/74  Pulse: 88 82  Resp: 16 13  Temp:  36.8 C  SpO2: 99% 100%    Complications: No apparent anesthesia complications

## 2021-02-05 ENCOUNTER — Encounter (HOSPITAL_COMMUNITY): Payer: Self-pay | Admitting: Orthopedic Surgery

## 2021-02-10 ENCOUNTER — Ambulatory Visit (HOSPITAL_COMMUNITY): Payer: Medicare Other

## 2021-02-14 ENCOUNTER — Emergency Department (HOSPITAL_COMMUNITY)
Admission: EM | Admit: 2021-02-14 | Discharge: 2021-02-14 | Disposition: A | Discharge status: Home / Self Care | Payer: Medicare Other | Attending: Emergency Medicine | Admitting: Emergency Medicine

## 2021-02-14 ENCOUNTER — Other Ambulatory Visit: Payer: Self-pay

## 2021-02-14 ENCOUNTER — Emergency Department (HOSPITAL_COMMUNITY): Payer: Medicare Other

## 2021-02-14 ENCOUNTER — Encounter (HOSPITAL_COMMUNITY): Payer: Self-pay | Admitting: Emergency Medicine

## 2021-02-14 DIAGNOSIS — Y92009 Unspecified place in unspecified non-institutional (private) residence as the place of occurrence of the external cause: Secondary | ICD-10-CM | POA: Diagnosis not present

## 2021-02-14 DIAGNOSIS — S0101XA Laceration without foreign body of scalp, initial encounter: Secondary | ICD-10-CM | POA: Insufficient documentation

## 2021-02-14 DIAGNOSIS — M542 Cervicalgia: Secondary | ICD-10-CM | POA: Diagnosis not present

## 2021-02-14 DIAGNOSIS — S0990XA Unspecified injury of head, initial encounter: Secondary | ICD-10-CM | POA: Diagnosis present

## 2021-02-14 DIAGNOSIS — W108XXA Fall (on) (from) other stairs and steps, initial encounter: Secondary | ICD-10-CM | POA: Insufficient documentation

## 2021-02-14 DIAGNOSIS — Y9301 Activity, walking, marching and hiking: Secondary | ICD-10-CM | POA: Diagnosis not present

## 2021-02-14 DIAGNOSIS — Z96612 Presence of left artificial shoulder joint: Secondary | ICD-10-CM | POA: Diagnosis not present

## 2021-02-14 DIAGNOSIS — Z96641 Presence of right artificial hip joint: Secondary | ICD-10-CM | POA: Diagnosis not present

## 2021-02-14 MED ORDER — OXYCODONE-ACETAMINOPHEN 5-325 MG PO TABS
1.0000 | ORAL_TABLET | Freq: Once | ORAL | Status: AC
  Administered 2021-02-14: 1 via ORAL
  Filled 2021-02-14: qty 1

## 2021-02-14 MED ORDER — ONDANSETRON 4 MG PO TBDP
4.0000 mg | ORAL_TABLET | Freq: Once | ORAL | Status: AC
  Administered 2021-02-14: 4 mg via ORAL
  Filled 2021-02-14: qty 1

## 2021-02-14 NOTE — Discharge Instructions (Addendum)
Keep the wound clean and dry for the first 24 hours. After that you may gently clean the wound with soap and water. Make sure to pat dry the wound before covering it with any dressing. You can use topical antibiotic ointment and bandage. Ice and elevate for pain relief.  ° °You can take Tylenol or Ibuprofen as directed for pain. You can alternate Tylenol and Ibuprofen every 4 hours for additional pain relief.  ° °Return to the Emergency Department, your primary care doctor, or the [facility] in 7-10 days for suture removal.  ° °Monitor closely for any signs of infection. Return to the Emergency Department for any worsening redness/swelling of the area that begins to spread, drainage from the site, worsening pain, fever or any other worsening or concerning symptoms.  ° ° °

## 2021-02-14 NOTE — ED Provider Notes (Signed)
[facility] EMERGENCY DEPARTMENT Provider Note   CSN: [id number] Arrival date & time: 02/14/21  1457     History Chief Complaint  Patient presents with  . Fall  . Head Laceration    Tanya Duncan is a 75 y.o. female with PMH/o fibromyalgia, GERD, HLD who presents for evaluation of headache and neck pain after a mechanical fall that occurred just prior to ED arrival.  She reports that she was walking at the steps to her house and tripped, causing her to fall backwards and hit her head.  No LOC.  She did not have any chest pain or dizziness preceding her fall.  She reports that since then, she has had pain to her neck and her head.  She reports her tetanus is up-to-date.  She has been able to ambulate since then.  She denies any pain anywhere else.  She just recently had a shoulder replacement has not in a sling.  Denies any pain to the shoulder.  Denies any abdominal pain, hip pain, leg pain.  She reports some nausea.  No vomiting.  No numbness/weakness of arms or legs.  The history is provided by the patient.       Past Medical History:  Diagnosis Date  . Anxiety   . Arthritis   . Blood transfusion without reported diagnosis 05/2019   with hip surgery  . Fibromyalgia   . Fractured pelvis (HCC) 09/15/2020   right  . GERD (gastroesophageal reflux disease)   . GI bleed 7829,5621   x2  . Hyperlipidemia   . Pneumonia 2005  . Stomach ulcer 1990    Patient Active Problem List   Diagnosis Date Noted  . Palpitations 09/01/2020  . Chest pain 09/01/2020  . Abnormal ECG 09/01/2020  . Moderate episode of recurrent major depressive disorder (HCC) 06/22/2020  . Prediabetes 06/22/2020  . Mixed hyperlipidemia 06/22/2020  . 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 Anxiety   . Primary osteoarthritis of right hip 05/14/2019  . Vitamin D deficiency 01/10/2019  . Insomnia due to anxiety and fear 10/25/2017  . Chronic prescription benzodiazepine use  10/25/2017  . Chronic bilateral low back pain without sciatica 04/27/2017  . Osteoarthritis 01/06/2017  . GAD (generalized anxiety disorder) 01/06/2017  . Rheumatoid arthritis involving multiple sites with positive rheumatoid factor (HCC) 12/12/2016  . Status post lumbar spinal fusion 12/12/2016  . Fibromyalgia 12/12/2016    Past Surgical History:  Procedure Laterality Date  . ABDOMINAL HYSTERECTOMY    . FRACTURE SURGERY Right 1998   ankle  . JOINT REPLACEMENT    . LUMBAR FUSION  2015  . OPEN REDUCTION INTERNAL FIXATION (ORIF) PROXIMAL PHALANX Right 09/21/2020   Procedure: OPEN TREATMENT OF RIGHT LONG FINGER PROXIMAL PHALANX FRACTURE;  Surgeon: Mack Hook, MD;  Location: [facility] OR;  Service: Orthopedics;  Laterality: Right;  LENGTH OF SURGERY: 75 MIN  . ORIF HUMERUS FRACTURE Left   . ORIF TIBIA FRACTURE Right 1998   with bone graft  . PYLOROPLASTY    . REVERSE SHOULDER ARTHROPLASTY Left 02/04/2021   Procedure: REVERSE SHOULDER ARTHROPLASTY WITH HARDWARE REMOVAL;  Surgeon: Jones Broom, MD;  Location: [facility] ORS;  Service: Orthopedics;  Laterality: Left;  . TOTAL HIP ARTHROPLASTY  2011  . TOTAL HIP ARTHROPLASTY Right 05/14/2019   Procedure: Right Anterior Hip Arthroplasty;  Surgeon: Marcene Corning, MD;  Location: [facility] ORS;  Service: Orthopedics;  Laterality: Right;  . TOTAL SHOULDER REPLACEMENT  2014     OB History  No obstetric history on file.     Family History  Problem Relation Age of Onset  . Colon cancer Father 30    Social History   Tobacco Use  . Smoking status: Never Smoker  . Smokeless tobacco: Never Used  Vaping Use  . Vaping Use: Never used  Substance Use Topics  . Alcohol use: Yes    Comment: rare  . Drug use: Never    Home Medications Prior to Admission medications   Medication Sig Start Date End Date Taking? Authorizing Provider  Ascorbic Acid (VITAMIN C) 1000 MG tablet Take 1,000 mg by mouth daily.     [provider]  b complex vitamins  tablet Take 1 tablet by mouth daily.    [provider]  bismuth subsalicylate (PEPTO BISMOL) 262 MG/15ML suspension Take 30 mLs by mouth every 6 (six) hours as needed (upset stomach).    [provider]  buPROPion (WELLBUTRIN XL) 150 MG 24 hr tablet Take 150 mg by mouth daily.  08/07/20   [provider]  Calcium Polycarbophil (FIBER-CAPS PO) Take 3 tablets by mouth daily.    [provider]  diclofenac Sodium (VOLTAREN) 1 % GEL Place 1 application onto the skin 4 (four) times daily as needed (pain). 03/07/19   [provider]  ergocalciferol (VITAMIN D2) 1.25 MG (50000 UT) capsule Take 50,000 Units by mouth every Monday.  08/26/20   [provider]  escitalopram (LEXAPRO) 20 MG tablet Take 20 mg by mouth daily.    [provider]  Ferrous Sulfate (IRON PO) Take 1 tablet by mouth daily as needed (low iron).    [provider]  gabapentin (NEURONTIN) 300 MG capsule Take 600 mg by mouth 2 (two) times daily.    [provider]  HYDROcodone-acetaminophen (NORCO/VICODIN) 5-325 MG tablet Take 1-2 tablets by mouth every 4 (four) hours as needed. 02/04/21   Jiles Harold, PA-C  LORazepam (ATIVAN) 1 MG tablet Take 1 mg by mouth 2 (two) times daily.    [provider]  Melatonin 5 MG TABS Take 5 mg by mouth at bedtime as needed (Sleep).    [provider]  methocarbamol (ROBAXIN) 500 MG tablet Take 500 mg by mouth every 6 (six) hours as needed for muscle spasms.  08/25/20   [provider]  Multiple Vitamin (MULTIVITAMIN WITH MINERALS) TABS tablet Take 1 tablet by mouth daily.    [provider]  nortriptyline (PAMELOR) 25 MG capsule Take 100 mg by mouth at bedtime.     [provider]  pantoprazole (PROTONIX) 40 MG tablet Take 1 tablet (40 mg total) by mouth 2 (two) times daily. 09/18/20   Meryl Dare, MD  PERCOCET 5-325 MG tablet 1-2 tablet by mouth every 4-6 hours as needed  for pain 02/05/21   [provider]  pravastatin (PRAVACHOL) 80 MG tablet Take 80 mg by mouth daily.    [provider]  Psyllium (DAILY FIBER PO) Take 3-4 capsules by mouth daily.    [provider]  sucralfate (CARAFATE) 1 g tablet Take 1-2 g by mouth once a week. 07/09/20   [provider]  tiZANidine (ZANAFLEX) 4 MG tablet Take 1 tablet (4 mg total) by mouth every 8 (eight) hours as needed for muscle spasms. 02/04/21   Jiles Harold, PA-C  traMADol (ULTRAM) 50 MG tablet Take 50-100 mg by mouth 4 (four) times daily as needed for severe pain or moderate pain. 12/01/20   [provider]  zolpidem (AMBIEN) 10 MG tablet Take 10 mg by mouth at bedtime as needed for sleep. 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  Cardiovascular: Negative for chest pain.  Gastrointestinal: Negative for abdominal pain, nausea and vomiting.  Musculoskeletal: Positive for neck pain.  Neurological: Positive for headaches. Negative for weakness and numbness.  All other systems reviewed and are negative.   Physical Exam Updated Vital Signs BP (!) 145/81 (BP Location: Right Arm)   Pulse (!) 102   Temp 98.8 F (37.1 C)   Resp 16   LMP  (LMP Unknown)   SpO2 99%   Physical Exam Vitals and nursing note reviewed.  Constitutional:      Appearance: Normal appearance. She is well-developed.  HENT:     Head: Normocephalic and atraumatic.      Comments: Small 1 cm laceration posterior aspect of head with surrounding hematoma. Eyes:     General: Lids are normal.     Conjunctiva/sclera: Conjunctivae normal.     Pupils: Pupils are equal, round, and reactive to light.     Comments: PERRL. EOMs intact. No nystagmus. No neglect.   Neck:      Comments: Tenderness palpation midline C-spine.  No deformity or crepitus noted. Cardiovascular:     Rate and Rhythm: Normal rate and regular rhythm.     Pulses: Normal pulses.     Heart sounds: Normal heart sounds. No murmur heard. No friction rub. No gallop.   Pulmonary:     Effort: Pulmonary effort is normal.     Breath sounds: Normal breath sounds.  Abdominal:     Palpations: Abdomen is soft. Abdomen is not rigid.     Tenderness: There is no abdominal tenderness. There is no guarding.  Musculoskeletal:        General: Normal range of motion.     Comments: LUE in sling from previous surgery. No tenderness to palpation to bilateral shoulders, clavicles, elbows, and wrists. No deformities or crepitus noted. FROM of BUE without difficulty. No tenderness to palpation to bilateral knees and ankles. No deformities or crepitus noted. FROM of BLE without any difficulty.  No midline T or L-spine tenderness.  No deformity or crepitus noted.  Skin:    General: Skin is warm and dry.     Capillary Refill: Capillary refill takes less than 2 seconds.  Neurological:     Mental Status: She is alert and oriented to person, place, and time.     Comments: Cranial nerves III-XII intact Follows commands, Moves all extremities  5/5 strength to BUE and BLE  Sensation intact throughout all major nerve distributions Normal finger to nose. No dysdiadochokinesia. No pronator drift. No gait abnormalities  No slurred speech. No facial droop.   Psychiatric:        Speech: Speech normal.     ED Results / Procedures / Treatments   Labs (all labs ordered are listed, but only abnormal results are displayed) Labs Reviewed - No data to display  EKG None  Radiology CT Head Wo Contrast  Result Date: 02/14/2021 CLINICAL DATA:  75 year old who lost her balance earlier today falling backwards, striking the back of her head. Laceration to the back of the head. Headache and neck pain. Patient denies loss of  consciousness. Initial encounter.  EXAM: CT HEAD WITHOUT CONTRAST CT CERVICAL SPINE WITHOUT CONTRAST TECHNIQUE: Multidetector CT imaging of the head and cervical spine was performed following the standard protocol without intravenous contrast. Multiplanar CT image reconstructions of the cervical spine were also generated. COMPARISON:  None. FINDINGS: CT HEAD FINDINGS Brain: Mild-to-moderate age related cortical atrophy, especially involving the frontal lobes. Ventricular system normal in size and appearance for age. No mass lesion. No midline shift. No acute hemorrhage or hematoma. No extra-axial fluid collections. No evidence of acute infarction. Vascular: Mild BILATERAL carotid siphon and LEFT vertebral artery atherosclerosis. No hyperdense vessel. Skull: No skull fracture or other focal osseous abnormality involving the skull. Sinuses/Orbits: Small air-fluid level in a POSTERIOR RIGHT ethmoid air cell. Remaining visualized paranasal sinuses, BILATERAL mastoid air cells and BILATERAL middle ear cavities well-aerated. Visualized orbits and globes unremarkable. Other: LEFT posterior parietal scalp hematoma. CT CERVICAL SPINE FINDINGS Alignment: Straightening of the usual lordosis. Grade 1 spondylolisthesis of C3 on C4 measuring 4 mm and grade 1 spondylolisthesis of C4 on C5 measuring 3 mm, both likely degenerative in nature, as there are diffuse facet degenerative changes. The facet joints are anatomically aligned. Skull base and vertebrae: No fractures identified involving the cervical spine. Coronal reformatted images demonstrate an intact craniocervical junction, intact dens and intact lateral masses throughout. Soft tissues and spinal canal: No evidence of paraspinous or spinal canal hematoma. No evidence of spinal stenosis. Disc levels: Disc space narrowing and endplate hypertrophic changes at every level from C3-4 through C7-T1. Facet and uncinate hypertrophy account for multilevel foraminal stenoses including:  Mild RIGHT C2-3, severe BILATERAL C3-4, severe BILATERAL C4-5, severe BILATERAL C5-6 and severe BILATERAL C6-7. Upper chest: Visualized lung apices clear. Visualized superior mediastinum unremarkable; beam hardening streak artifact from the patient's bilateral shoulder prostheses partially obscures the superior mediastinum. Other: None. IMPRESSION: 1. No acute intracranial abnormality. 2. LEFT posterior parietal scalp hematoma without evidence of skull fracture. 3. No cervical spine fractures identified. 4. Grade 1 spondylolisthesis of C3 on C4 and C4 on C5, both likely degenerative in nature. 5. Multilevel degenerative disc disease, spondylosis and facet degenerative changes throughout the cervical spine with multilevel foraminal stenoses as detailed above. Electronically Signed   By: Hulan Saas M.D.   On: 02/14/2021 17:05   CT Cervical Spine Wo Contrast  Result Date: 02/14/2021 CLINICAL DATA:  75 year old who lost her balance earlier today falling backwards, striking the back of her head. Laceration to the back of the head. Headache and neck pain. Patient denies loss of consciousness. Initial encounter. EXAM: CT HEAD WITHOUT CONTRAST CT CERVICAL SPINE WITHOUT CONTRAST TECHNIQUE: Multidetector CT imaging of the head and cervical spine was performed following the standard protocol without intravenous contrast. Multiplanar CT image reconstructions of the cervical spine were also generated. COMPARISON:  None. FINDINGS: CT HEAD FINDINGS Brain: Mild-to-moderate age related cortical atrophy, especially involving the frontal lobes. Ventricular system normal in size and appearance for age. No mass lesion. No midline shift. No acute hemorrhage or hematoma. No extra-axial fluid collections. No evidence of acute infarction. Vascular: Mild BILATERAL carotid siphon and LEFT vertebral artery atherosclerosis. No hyperdense vessel. Skull: No skull fracture or other focal osseous abnormality involving the skull.  Sinuses/Orbits: Small air-fluid level in a POSTERIOR RIGHT ethmoid air cell. Remaining visualized paranasal sinuses, BILATERAL mastoid air cells and BILATERAL middle ear cavities well-aerated. Visualized orbits and globes unremarkable. Other: LEFT posterior parietal scalp hematoma. CT CERVICAL SPINE FINDINGS Alignment: Straightening of the usual lordosis. Grade 1 spondylolisthesis of C3 on C4 measuring  4 mm and grade 1 spondylolisthesis of C4 on C5 measuring 3 mm, both likely degenerative in nature, as there are diffuse facet degenerative changes. The facet joints are anatomically aligned. Skull base and vertebrae: No fractures identified involving the cervical spine. Coronal reformatted images demonstrate an intact craniocervical junction, intact dens and intact lateral masses throughout. Soft tissues and spinal canal: No evidence of paraspinous or spinal canal hematoma. No evidence of spinal stenosis. Disc levels: Disc space narrowing and endplate hypertrophic changes at every level from C3-4 through C7-T1. Facet and uncinate hypertrophy account for multilevel foraminal stenoses including: Mild RIGHT C2-3, severe BILATERAL C3-4, severe BILATERAL C4-5, severe BILATERAL C5-6 and severe BILATERAL C6-7. Upper chest: Visualized lung apices clear. Visualized superior mediastinum unremarkable; beam hardening streak artifact from the patient's bilateral shoulder prostheses partially obscures the superior mediastinum. Other: None. IMPRESSION: 1. No acute intracranial abnormality. 2. LEFT posterior parietal scalp hematoma without evidence of skull fracture. 3. No cervical spine fractures identified. 4. Grade 1 spondylolisthesis of C3 on C4 and C4 on C5, both likely degenerative in nature. 5. Multilevel degenerative disc disease, spondylosis and facet degenerative changes throughout the cervical spine with multilevel foraminal stenoses as detailed above. Electronically Signed   By: Hulan Saas M.D.   On: 02/14/2021  17:05    Procedures .Marland KitchenLaceration Repair  Date/Time: 02/14/2021 9:22 PM Performed by: Maxwell Caul, PA-C Authorized by: Maxwell Caul, PA-C   Consent:    Consent obtained:  Verbal   Consent given by:  Patient   Risks discussed:  Infection, need for additional repair, pain, poor cosmetic result and poor wound healing   Alternatives discussed:  No treatment and delayed treatment Universal protocol:    Procedure explained and questions answered to patient or proxy's satisfaction: yes     Relevant documents present and verified: yes     Test results available: yes     Imaging studies available: yes     Required blood products, implants, devices, and special equipment available: yes     Site/side marked: yes     Immediately prior to procedure, a time out was called: yes     Patient identity confirmed:  Verbally with patient Anesthesia:    Anesthesia method:  Local infiltration Laceration details:    Location:  Scalp   Scalp location: Posterior.   Length (cm):  1 Pre-procedure details:    Preparation:  Patient was prepped and draped in usual sterile fashion Exploration:    Hemostasis achieved with:  Direct pressure   Imaging outcome: foreign body not noted   Treatment:    Area cleansed with:  Povidone-iodine   Amount of cleaning:  Extensive   Irrigation solution:  Sterile saline   Irrigation method:  Syringe   Debridement:  None Skin repair:    Repair method:  Staples   Number of staples:  2 Approximation:    Approximation:  Close Repair type:    Repair type:  Simple Post-procedure details:    Procedure completion:  Tolerated Comments:     The area was cleaned and thoroughly irrigated with sterile saline.  Using a sterile Q-tip, I probed the area and found a small 1 cm laceration.  This area was fixed with 2 staples.     Medications Ordered in ED Medications  ondansetron (ZOFRAN-ODT) disintegrating tablet 4 mg (4 mg Oral Given 02/14/21 1652)   oxyCODONE-acetaminophen (PERCOCET/ROXICET) 5-325 MG per tablet 1 tablet (1 tablet Oral Given 02/14/21 1652)    ED Course  I have  reviewed the triage vital signs and the nursing notes.  Pertinent labs & imaging results that were available during my care of the patient were reviewed by me and considered in my medical decision making (see chart for details).    MDM Rules/Calculators/A&P                          75 y.o. F who presents for evaluation of headache and neck after a mechanical fall that occurred just prior to ED arrival.  No preceding chest pain or dizziness.  No LOC.  She is on a blood thinners.  Initially ED arrival, she is afebrile nontoxic-appearing.  Vital signs are stable.  On exam, she has tenderness palpation in midline C-spine.  No deformity or step-off noted.  She has hematoma noted to the left posterior head with a small 1 cm laceration.  She reports her tetanus is up-to-date.  Given head injury, neck pain, will plan for imaging.  She had recent shoulder replacement surgery.  She states she did not hit her shoulder and does not have any pain that is new or different.  CT head shows no acute intracranial abnormality.  She has a left posterior scalp hematoma with no evidence of skull fracture.  CT C-spine shows no acute cervical spine fracture.  She does have spondylolisthesis noted in multiple level degenerative disc disease.  Laceration repaired as documented above.  Patient tolerated procedure well.  Encouraged at home supportive care measures. At this time, patient exhibits no emergent life-threatening condition that require further evaluation in ED. Patient had ample opportunity for questions and discussion. All patient's questions were answered with full understanding. Strict return precautions discussed. Patient expresses understanding and agreement to plan.  Portions of this note were generated with Scientist, clinical (histocompatibility and immunogenetics). Dictation errors may occur despite best  attempts at proofreading.   Final Clinical Impression(s) / ED Diagnoses Final diagnoses:  Injury of head, initial encounter  Laceration of scalp, initial encounter    Rx / DC Orders ED Discharge Orders    None       Rosana Hoes 02/14/21 2124    Cathren Laine, MD 02/14/21 2300

## 2021-02-14 NOTE — ED Triage Notes (Signed)
Pt lost balance and fell when on the 4th step and hit the sidewalk approx 30 min ago.  Laceration to back of head. Denies LOC.  No blood thinners.  Reports neck aching.

## 2021-02-26 ENCOUNTER — Ambulatory Visit (HOSPITAL_COMMUNITY): Admission: RE | Admit: 2021-02-26 | Payer: Medicare Other | Source: Ambulatory Visit

## 2021-06-14 ENCOUNTER — Other Ambulatory Visit: Payer: Self-pay | Admitting: Gastroenterology

## 2021-06-14 ENCOUNTER — Telehealth: Payer: Self-pay | Admitting: Gastroenterology

## 2021-06-14 ENCOUNTER — Telehealth: Payer: Self-pay | Admitting: Physician Assistant

## 2021-06-14 DIAGNOSIS — K219 Gastro-esophageal reflux disease without esophagitis: Secondary | ICD-10-CM

## 2021-06-14 DIAGNOSIS — R1013 Epigastric pain: Secondary | ICD-10-CM

## 2021-06-14 DIAGNOSIS — K2100 Gastro-esophageal reflux disease with esophagitis, without bleeding: Secondary | ICD-10-CM

## 2021-06-14 NOTE — Telephone Encounter (Signed)
Error

## 2021-06-14 NOTE — Telephone Encounter (Signed)
Patient called would like to know if she can up her dosage on Protonix or if she can get another script that is stronger.

## 2021-06-14 NOTE — Telephone Encounter (Signed)
Patient calling for continued reflux.  She was to have a GES last November.  Has been rescheduled multiple times due to broken pelvis in late December.  Okay to reschedule GES now or do you want an office visit first?

## 2021-06-15 NOTE — Telephone Encounter (Signed)
Left message for patient to call back  

## 2021-06-16 NOTE — Telephone Encounter (Signed)
Patient notified of the recommendations.  She is aware she will be contacted directly by [facility] Scheduling.  She has had continued symptoms from her broken pelvis.

## 2021-06-17 ENCOUNTER — Other Ambulatory Visit: Payer: Self-pay | Admitting: Gastroenterology

## 2021-06-17 DIAGNOSIS — K219 Gastro-esophageal reflux disease without esophagitis: Secondary | ICD-10-CM

## 2021-07-21 ENCOUNTER — Encounter (HOSPITAL_COMMUNITY): Payer: Medicare Other

## 2021-08-03 ENCOUNTER — Ambulatory Visit (HOSPITAL_COMMUNITY): Admission: RE | Admit: 2021-08-03 | Payer: Medicare Other | Source: Ambulatory Visit

## 2021-09-15 ENCOUNTER — Other Ambulatory Visit: Payer: Self-pay | Admitting: Orthopedic Surgery

## 2021-09-15 DIAGNOSIS — M25512 Pain in left shoulder: Secondary | ICD-10-CM

## 2021-09-21 ENCOUNTER — Ambulatory Visit
Admission: RE | Admit: 2021-09-21 | Discharge: 2021-09-21 | Disposition: A | Discharge status: Home / Self Care | Payer: Medicare Other | Source: Ambulatory Visit | Attending: Orthopedic Surgery | Admitting: Orthopedic Surgery

## 2021-09-21 DIAGNOSIS — M25512 Pain in left shoulder: Secondary | ICD-10-CM

## 2021-10-08 ENCOUNTER — Other Ambulatory Visit: Payer: Medicare Other

## 2021-10-24 ENCOUNTER — Other Ambulatory Visit: Payer: Self-pay | Admitting: Gastroenterology

## 2021-10-24 DIAGNOSIS — K219 Gastro-esophageal reflux disease without esophagitis: Secondary | ICD-10-CM

## 2021-10-25 IMAGING — CT CT CERVICAL SPINE W/O CM
3 series · 14 of 33 positions shown, 17 images · non-contrast
Comparison: None.

CLINICAL DATA: 75-year-old who lost her balance earlier today
falling backwards, striking the back of her head. Laceration to the
back of the head. Headache and neck pain. Patient denies loss of
consciousness. Initial encounter.

EXAM:
CT HEAD WITHOUT CONTRAST
CT CERVICAL SPINE WITHOUT CONTRAST
TECHNIQUE: Multidetector CT imaging of the head and cervical spine was
performed following the standard protocol without intravenous
contrast. Multiplanar CT image reconstructions of the cervical spine
were also generated.

[Series 5: c spine soft · axial · 0.29mm/px · z∈[-292,-164]mm · 6 of 84 slices shown, 8 images]
[im 13/84  soft-tissue]
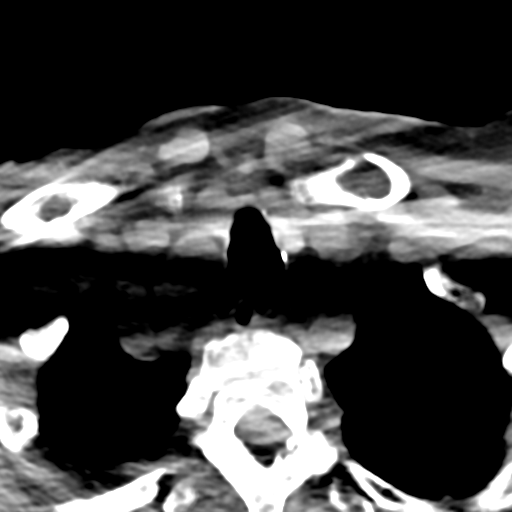
[im 13/84  bone]
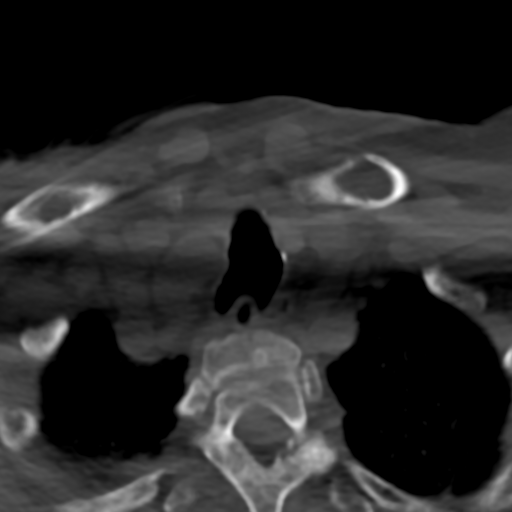
[im 26/84  bone]
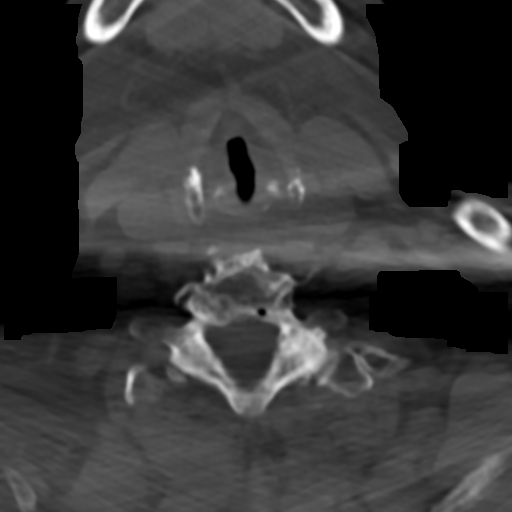
[im 39/84  bone]
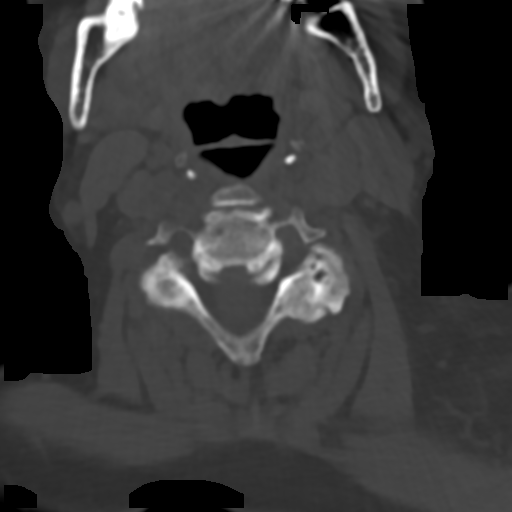
[im 52/84  bone]
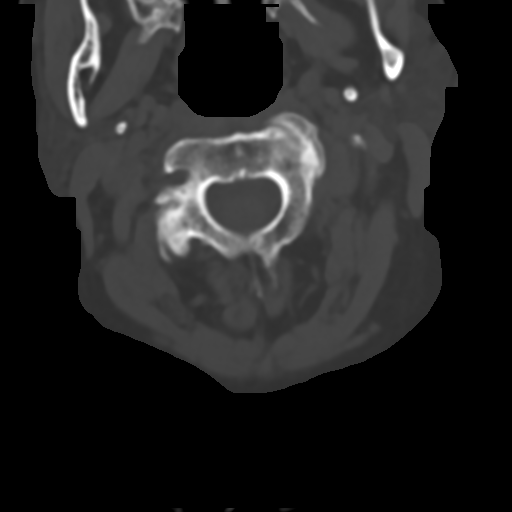
[im 64/84  soft-tissue]
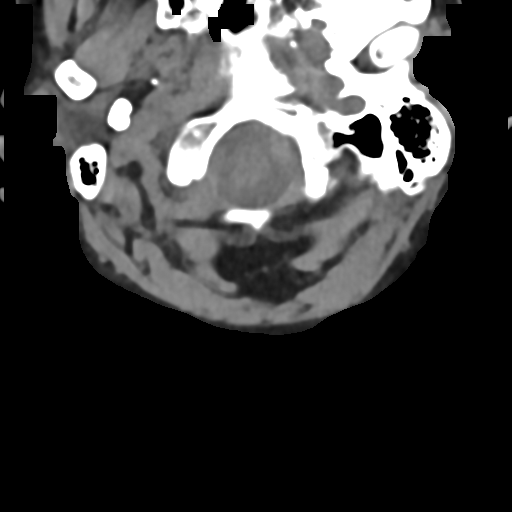
[im 64/84  bone]
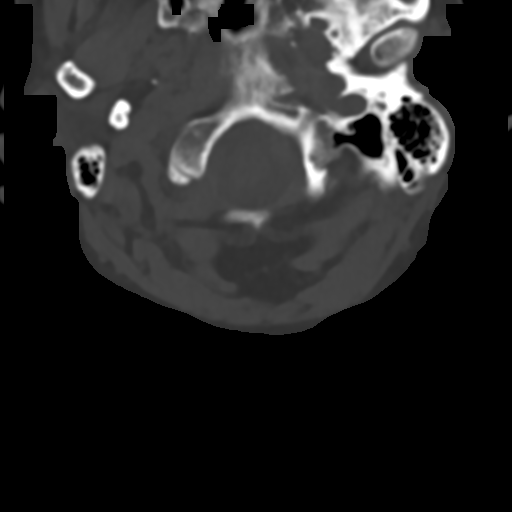
[im 77/84  bone]
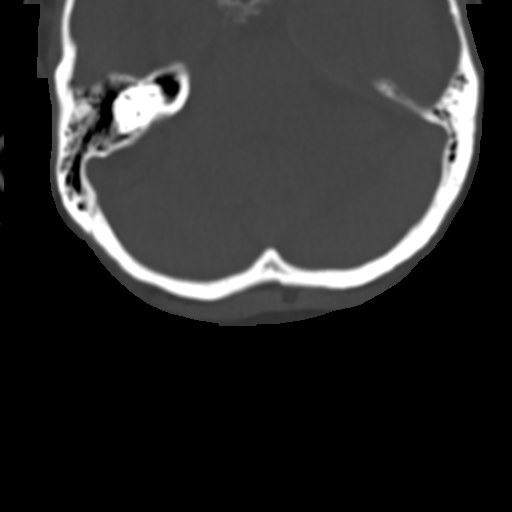

[Series 8: sag bone · sagittal · 0.37mm/px · 5 of 52 slices shown, 6 images]
[im 18/52  bone]
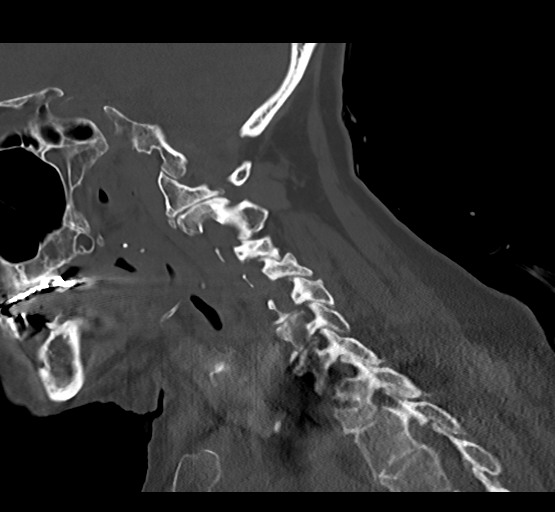
[im 22/52  bone]
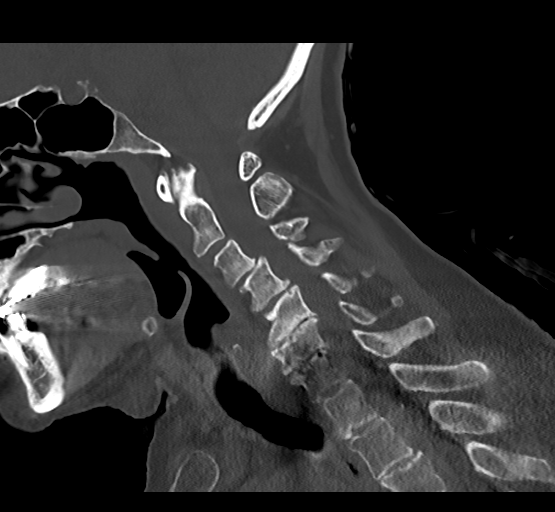
[im 26/52  soft-tissue]
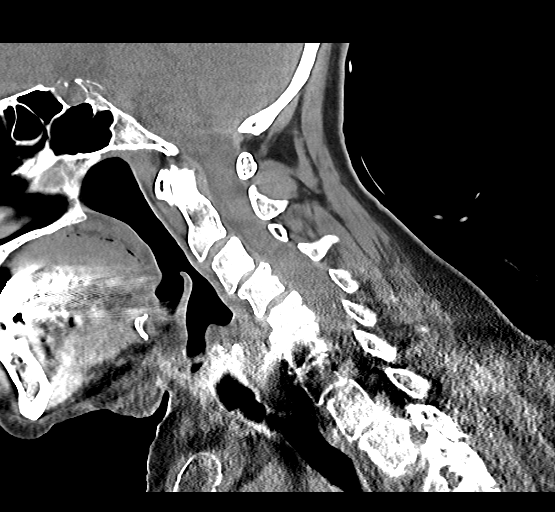
[im 26/52  bone]
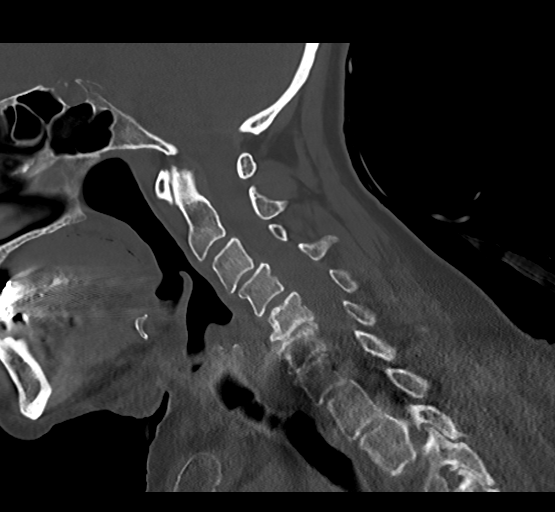
[im 30/52  bone]
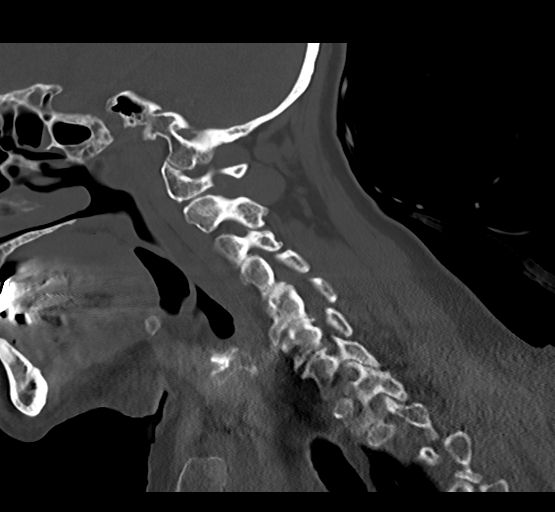
[im 35/52  bone]
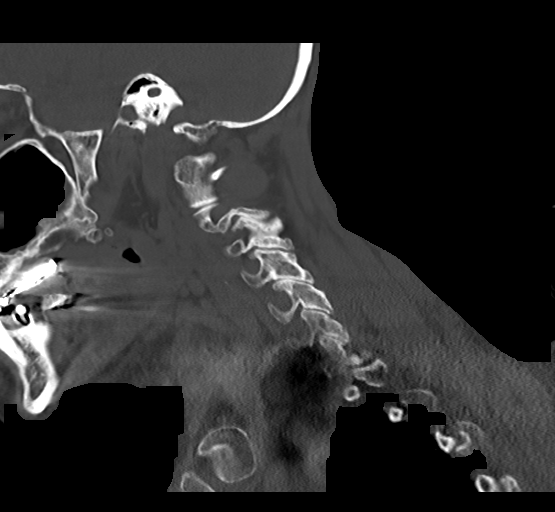

[Series 9: cor bone · coronal · 0.31mm/px · 3 of 51 slices shown]
[im 12/51  bone]
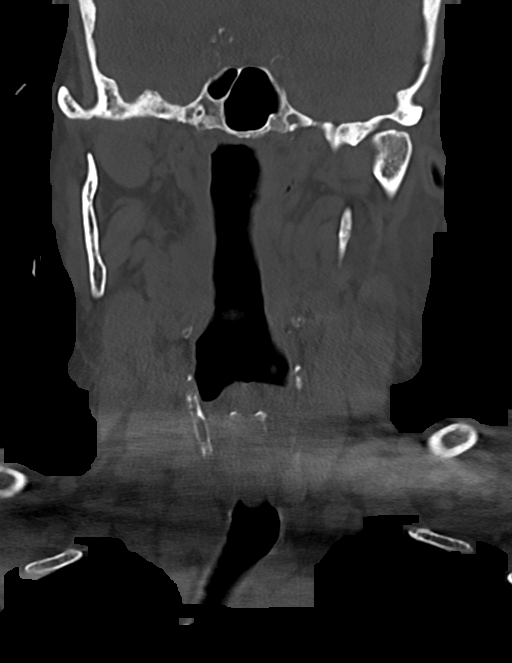
[im 21/51  bone]
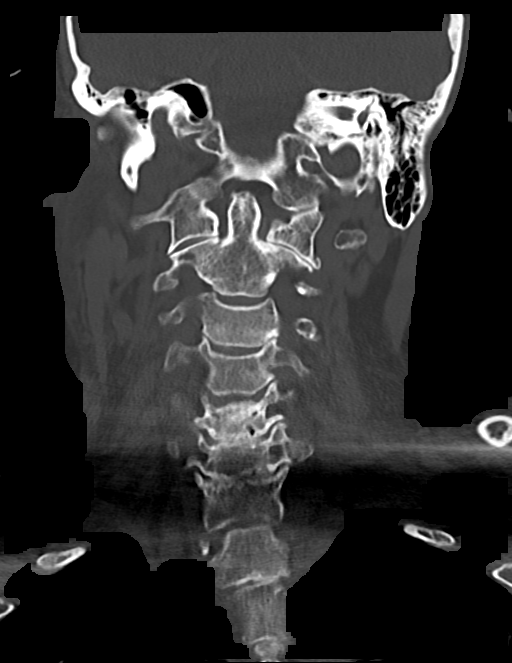
[im 30/51  bone]
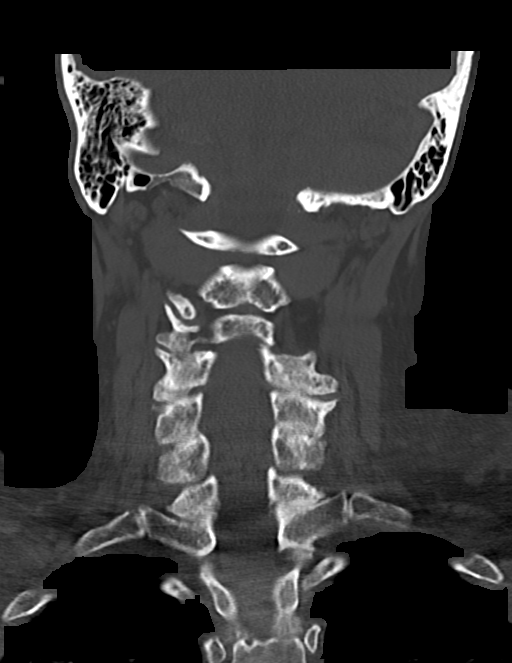

[14 of 33 positions shown; findings below may reference images not displayed]

FINDINGS: CT HEAD FINDINGS

Brain: Mild-to-moderate age related cortical atrophy, especially
involving the frontal lobes. Ventricular system normal in size and
appearance for age. No mass lesion. No midline shift. No acute
hemorrhage or hematoma. No extra-axial fluid collections. No
evidence of acute infarction.

Vascular: Mild BILATERAL carotid siphon and LEFT vertebral artery
atherosclerosis. No hyperdense vessel.

Skull: No skull fracture or other focal osseous abnormality
involving the skull.

Sinuses/Orbits: Small air-fluid level in a POSTERIOR RIGHT ethmoid
air cell. Remaining visualized paranasal sinuses, BILATERAL mastoid
air cells and BILATERAL middle ear cavities well-aerated. Visualized
orbits and globes unremarkable.

Other: LEFT posterior parietal scalp hematoma.

CT CERVICAL SPINE FINDINGS

Alignment: Straightening of the usual lordosis. Grade 1
spondylolisthesis of C3 on C4 measuring 4 mm and grade 1
spondylolisthesis of C4 on C5 measuring 3 mm, both likely
degenerative in nature, as there are diffuse facet degenerative
changes. The facet joints are anatomically aligned.

Skull base and vertebrae: No fractures identified involving the
cervical spine. Coronal reformatted images demonstrate an intact
craniocervical junction, intact dens and intact lateral masses
throughout.

Soft tissues and spinal canal: No evidence of paraspinous or spinal
canal hematoma. No evidence of spinal stenosis.

Disc levels: Disc space narrowing and endplate hypertrophic changes
at every level from C3-4 through C7-T1. Facet and uncinate
hypertrophy account for multilevel foraminal stenoses including:
Mild RIGHT C2-3, severe BILATERAL C3-4, severe BILATERAL C4-5,
severe BILATERAL C5-6 and severe BILATERAL C6-7.

Upper chest: Visualized lung apices clear. Visualized superior
mediastinum unremarkable; beam hardening streak artifact from the
patient's bilateral shoulder prostheses partially obscures the
superior mediastinum.

Other: None.
IMPRESSION: 1. No acute intracranial abnormality.
2. LEFT posterior parietal scalp hematoma without evidence of skull
fracture.
3. No cervical spine fractures identified.
4. Grade 1 spondylolisthesis of C3 on C4 and C4 on C5, both likely
degenerative in nature.
5. Multilevel degenerative disc disease, spondylosis and facet
degenerative changes throughout the cervical spine with multilevel
foraminal stenoses as detailed above.

## 2021-11-18 ENCOUNTER — Ambulatory Visit: Payer: Medicare Other | Admitting: Physician Assistant

## 2021-12-06 ENCOUNTER — Ambulatory Visit (INDEPENDENT_AMBULATORY_CARE_PROVIDER_SITE_OTHER): Payer: Medicare Other | Admitting: Physician Assistant

## 2021-12-06 ENCOUNTER — Encounter: Payer: Self-pay | Admitting: Physician Assistant

## 2021-12-06 VITALS — BP 140/72 | HR 92 | Ht 61.0 in | Wt 137.1 lb

## 2021-12-06 DIAGNOSIS — R933 Abnormal findings on diagnostic imaging of other parts of digestive tract: Secondary | ICD-10-CM

## 2021-12-06 DIAGNOSIS — K219 Gastro-esophageal reflux disease without esophagitis: Secondary | ICD-10-CM

## 2021-12-06 DIAGNOSIS — R1314 Dysphagia, pharyngoesophageal phase: Secondary | ICD-10-CM | POA: Diagnosis not present

## 2021-12-06 NOTE — Progress Notes (Signed)
Chief Complaint: Dysphagia and reflux  HPI:    Tanya Duncan is a 76 year old female with a past medical history as listed below including Barrett's esophagus, reflux and fibromyalgia, known to Dr. Fuller Plan, who presents clinic today with a complaint of dysphagia and reflux.    09/10/2020 patient seen in clinic by me and at that time was complaining of epigastric pressure and felt like she might have a "hiatal hernia".  Also an increase in reflux regardless of Omeprazole 40 daily and Carafate 4 times daily.  At that time scheduled her for an EGD.  We increased her Omeprazole to 40 twice daily and continued Carafate 4 times daily.    09/18/2020 EGD with Z-line variable, LA grade a esophagitis, small hiatal hernia, gastritis and a medium amount of food residue in the stomach.  Patient was scheduled for gastric emptying study and her Omeprazole was stopped.  She was started on Pantoprazole 40 twice daily.    Today, the patient tells me that over the past 2 months she has had increased difficulty when trying to swallow white meat such as chicken or pork.  Tells me these foods feel like they get hung up in her throat on the way down.  She has not noticed it with anything else then this.  Does tell me she continues with breakthrough reflux symptoms on some days regardless of her Pantoprazole 40 twice daily.  Tells me that it just "hits her out of the blue" and then she is "chewing Tums like crazy".  She has never noticed certain foods that seem to make this worse or any correlation with what she is doing.    Denies fever, chills, weight loss, change in bowel habits or symptoms that awaken her from sleep.  Past Medical History:  Diagnosis Date   Anxiety    Arthritis    Blood transfusion without reported diagnosis 05/2019   with hip surgery   Fibromyalgia    Fractured pelvis ([place]) 09/15/2020   right   GERD (gastroesophageal reflux disease)    GI bleed [id number]   x2   Hyperlipidemia    Pneumonia 2005    Stomach ulcer 1990    Past Surgical History:  Procedure Laterality Date   ABDOMINAL HYSTERECTOMY     FRACTURE SURGERY Right 1998   ankle   JOINT REPLACEMENT     LUMBAR FUSION  2015   OPEN REDUCTION INTERNAL FIXATION (ORIF) PROXIMAL PHALANX Right 09/21/2020   Procedure: OPEN TREATMENT OF RIGHT LONG FINGER PROXIMAL PHALANX FRACTURE;  Surgeon: Milly Jakob, MD;  Location: [place] [facility];  Service: Orthopedics;  Laterality: Right;  LENGTH OF SURGERY: 75 MIN   ORIF HUMERUS FRACTURE Left    ORIF TIBIA FRACTURE Right 1998   with bone graft   PYLOROPLASTY     REVERSE SHOULDER ARTHROPLASTY Left 02/04/2021   Procedure: REVERSE SHOULDER ARTHROPLASTY WITH HARDWARE REMOVAL;  Surgeon: Tania Ade, MD;  Location: [facility];  Service: Orthopedics;  Laterality: Left;   TOTAL HIP ARTHROPLASTY  2011   TOTAL HIP ARTHROPLASTY Right 05/14/2019   Procedure: Right Anterior Hip Arthroplasty;  Surgeon: Melrose Nakayama, MD;  Location: [facility];  Service: Orthopedics;  Laterality: Right;   TOTAL SHOULDER REPLACEMENT  2014    Current Outpatient Medications  Medication Sig Dispense Refill   Ascorbic Acid (VITAMIN C) 1000 MG tablet Take 1,000 mg by mouth daily.      b complex vitamins tablet Take 1 tablet by mouth daily.     bismuth subsalicylate (PEPTO BISMOL) 262  MG/15ML suspension Take 30 mLs by mouth every 6 (six) hours as needed (upset stomach).     buPROPion (WELLBUTRIN XL) 150 MG 24 hr tablet Take 150 mg by mouth daily.      Calcium Polycarbophil (FIBER-CAPS PO) Take 3 tablets by mouth daily.     diclofenac Sodium (VOLTAREN) 1 % GEL Place 1 application onto the skin 4 (four) times daily as needed (pain).     ergocalciferol (VITAMIN D2) 1.25 MG (50000 UT) capsule Take 50,000 Units by mouth every Monday.      escitalopram (LEXAPRO) 20 MG tablet Take 20 mg by mouth daily.     Ferrous Sulfate (IRON PO) Take 1 tablet by mouth daily as needed (low iron).     gabapentin (NEURONTIN) 300 MG capsule Take 600 mg by mouth 2  (two) times daily.     HYDROcodone-acetaminophen (NORCO/VICODIN) 5-325 MG tablet Take 1-2 tablets by mouth every 4 (four) hours as needed. 30 tablet 0   LORazepam (ATIVAN) 1 MG tablet Take 1 mg by mouth 2 (two) times daily.     Melatonin 5 MG TABS Take 5 mg by mouth at bedtime as needed (Sleep).     methocarbamol (ROBAXIN) 500 MG tablet Take 500 mg by mouth every 6 (six) hours as needed for muscle spasms.      Multiple Vitamin (MULTIVITAMIN WITH MINERALS) TABS tablet Take 1 tablet by mouth daily.     nortriptyline (PAMELOR) 25 MG capsule Take 100 mg by mouth at bedtime.      pantoprazole (PROTONIX) 40 MG tablet TAKE 1 TABLET(40 MG) BY MOUTH TWICE DAILY 180 tablet 0   PERCOCET 5-325 MG tablet 1-2 tablet by mouth every 4-6 hours as needed for pain     pravastatin (PRAVACHOL) 80 MG tablet Take 80 mg by mouth daily.     Psyllium (DAILY FIBER PO) Take 3-4 capsules by mouth daily.     sucralfate (CARAFATE) 1 g tablet Take 1-2 g by mouth once a week.     tiZANidine (ZANAFLEX) 4 MG tablet Take 1 tablet (4 mg total) by mouth every 8 (eight) hours as needed for muscle spasms. 30 tablet 1   traMADol (ULTRAM) 50 MG tablet Take 50-100 mg by mouth 4 (four) times daily as needed for severe pain or moderate pain.     zolpidem (AMBIEN) 10 MG tablet Take 10 mg by mouth at bedtime as needed for sleep. Must be [employer] brand     No current facility-administered medications for this visit.    Allergies as of 12/06/2021 - Review Complete 02/04/2021  Allergen Reaction Noted   Atorvastatin Diarrhea 09/16/2020   Ibuprofen Other (See Comments) 09/16/2020   Thelma Barge officinalis] Other (See Comments) 05/14/2019   Sulfa antibiotics Nausea And Vomiting 05/09/2018   Avelox [moxifloxacin hcl in nacl] Palpitations 05/09/2018   Indocin [indomethacin] Other (See Comments) 05/09/2018    Family History  Problem Relation Age of Onset   Colon cancer Father 36    Social History   Socioeconomic History   Marital  status: Divorced    Spouse name: Not on file   Number of children: Not on file   Years of education: Not on file   Highest education level: Not on file  Occupational History   Occupation: Retired  Tobacco Use   Smoking status: Never   Smokeless tobacco: Never  Vaping Use   Vaping Use: Never used  Substance and Sexual Activity   Alcohol use: Yes    Comment: rare  Drug use: Never   Sexual activity: Not Currently    Birth control/protection: Surgical    Comment: Hysterectomy  Other Topics Concern   Not on file  Social History Narrative   Not on file   Social Determinants of Health   Financial Resource Strain: Not on file  Food Insecurity: Not on file  Transportation Needs: Not on file  Physical Activity: Not on file  Stress: Not on file  Social Connections: Not on file  Intimate Partner Violence: Not on file    Review of Systems:    Constitutional: No weight loss, fever or chills Cardiovascular: No chest pain  Respiratory: No SOB  Gastrointestinal: See HPI and otherwise negative   Physical Exam:  Vital signs: BP 140/72 (BP Location: Left Arm, Patient Position: Sitting, Cuff Size: Normal)    Pulse 92    Ht 5\' 1"  (1.549 m) Comment: height measured without shoes   Wt 137 lb 2 oz (62.2 kg)    LMP  (LMP Unknown)    BMI 25.91 kg/m    Constitutional:   Pleasant Elderly Caucasian female appears to be in NAD, Well developed, Well nourished, alert and cooperative Respiratory: Respirations even and unlabored. Lungs clear to auscultation bilaterally.   No wheezes, crackles, or rhonchi.  Cardiovascular: Normal S1, S2. No MRG. Regular rate and rhythm. No peripheral edema, cyanosis or pallor.  Gastrointestinal:  Soft, nondistended, nontender. No rebound or guarding. Normal bowel sounds. No appreciable masses or hepatomegaly. Psychiatric:  Demonstrates good judgement and reason without abnormal affect or behaviors.  RELEVANT LABS AND IMAGING: CBC    Component Value Date/Time    WBC 6.4 01/29/2021 1520   RBC 4.34 01/29/2021 1520   HGB 12.7 01/29/2021 1520   HCT 39.9 01/29/2021 1520   PLT 232 01/29/2021 1520   MCV 91.9 01/29/2021 1520   MCH 29.3 01/29/2021 1520   MCHC 31.8 01/29/2021 1520   RDW 13.2 01/29/2021 1520   LYMPHSABS 2.0 01/29/2021 1520   MONOABS 0.5 01/29/2021 1520   EOSABS 0.3 01/29/2021 1520   BASOSABS 0.0 01/29/2021 1520    CMP     Component Value Date/Time   NA 137 01/29/2021 1520   K 3.9 01/29/2021 1520   CL 101 01/29/2021 1520   CO2 28 01/29/2021 1520   GLUCOSE 100 (H) 01/29/2021 1520   BUN 7 (L) 01/29/2021 1520   CREATININE 0.67 01/29/2021 1520   CALCIUM 9.5 01/29/2021 1520   PROT 7.1 01/29/2021 1520   ALBUMIN 4.4 01/29/2021 1520   AST 28 01/29/2021 1520   ALT 28 01/29/2021 1520   ALKPHOS 76 01/29/2021 1520   BILITOT 0.6 01/29/2021 1520   GFRNONAA >60 01/29/2021 1520   GFRAA >60 05/18/2019 0552    Assessment: 1.  Dysphagia: Trouble with white meats getting stuck over the past 2 months, previous history of Barrett's esophagus, most recent EGD 09/18/2020 with LA grade a esophagitis, small hiatal hernia and residual food in the stomach; consider esophageal stricture +/- esophagitis 2.  GERD: Breakthrough symptoms every once in a while; consider relation of possible gastroparesis given residual food in the stomach on EGD as above  Plan: 1.  Scheduled patient for a barium esophagram with tablet for further evaluation of dysphagia.  Pending results from this may need repeat EGD with dilation. 2.  Continue Pantoprazole 40 twice daily, 30-60 minutes before breakfast and dinner.  Patient will call if she needs refills. 3.  Discussed findings of EGD and why we are wanting to do a gastric emptying  scan.  Recommend that we go ahead and schedule this now.  Scheduled patient for gastric emptying scan.  This could be contributing to her breakthrough reflux symptoms. 4.  Patient to follow in clinic per recommendations after imaging  above.  Tanya Newer, PA-C [facility] 12/06/2021, 11:04 AM  Cc: Bernerd Limbo, MD

## 2021-12-06 NOTE — Patient Instructions (Addendum)
Continue Pantoprazole twice daily.  You have been scheduled for a Barium Esophogram at [facility] (1st floor of the hospital) on Thursday 12/09/21 at 9 am. Please arrive 15 minutes prior to your appointment for registration. Make certain not to have anything to eat or drink 3 hours prior to your test. If you need to reschedule for any reason, please contact radiology at [phone] to do so. __________________________________________________________________ A barium swallow is an examination that concentrates on views of the esophagus. This tends to be a double contrast exam (barium and two liquids which, when combined, create a gas to distend the wall of the oesophagus) or single contrast (non-ionic iodine based). The study is usually tailored to your symptoms so a good history is essential. Attention is paid during the study to the form, structure and configuration of the esophagus, looking for functional disorders (such as aspiration, dysphagia, achalasia, motility and reflux) EXAMINATION You may be asked to change into a gown, depending on the type of swallow being performed. A radiologist and radiographer will perform the procedure. The radiologist will advise you of the type of contrast selected for your procedure and direct you during the exam. You will be asked to stand, sit or lie in several different positions and to hold a small amount of fluid in your mouth before being asked to swallow while the imaging is performed .In some instances you may be asked to swallow barium coated marshmallows to assess the motility of a solid food bolus. The exam can be recorded as a digital or video fluoroscopy procedure. POST PROCEDURE It will take 1-2 days for the barium to pass through your system. To facilitate this, it is important, unless otherwise directed, to increase your fluids for the next 24-48hrs and to resume your normal diet.  This test typically takes about 30 minutes to  perform. ___________________________________________________________________________  Bonita QuinYou have been scheduled for a gastric emptying scan at [facility] on Thursday 12/15/21 at 9:30 am. Please arrive at least 15 minutes prior to your appointment for registration. Please make certain not to have anything to eat or drink after midnight the night before your test. Hold all stomach medications (ex: Zofran, phenergan, Reglan) 48 hours prior to your test. If you need to reschedule your appointment, please contact radiology scheduling at [phone]. _____________________________________________________________________ A gastric-emptying study measures how long it takes for food to move through your stomach. There are several ways to measure stomach emptying. In the most common test, you eat food that contains a small amount of radioactive material. A scanner that detects the movement of the radioactive material is placed over your abdomen to monitor the rate at which food leaves your stomach. This test normally takes about 4 hours to complete.  If you are age 74 or older, your body mass index should be between 23-30. Your Body mass index is 25.91 kg/m. If this is out of the aforementioned range listed, please consider follow up with your Primary Care Provider.  If you are age 164 or younger, your body mass index should be between 19-25. Your Body mass index is 25.91 kg/m. If this is out of the aformentioned range listed, please consider follow up with your Primary Care Provider.   ________________________________________________________  The [facility] providers would like to encourage you to use [facility] to communicate with providers for non-urgent requests or questions.  Due to long hold times on the telephone, sending your provider a message by [facility] may be a faster and more efficient  way to get a response.  Please allow 48 business hours for a response.  Please remember that this is for  non-urgent requests.  _______________________________________________________  _____________________________________________________________________

## 2021-12-09 ENCOUNTER — Ambulatory Visit (HOSPITAL_COMMUNITY): Admission: RE | Admit: 2021-12-09 | Payer: Medicare Other | Source: Ambulatory Visit

## 2021-12-13 ENCOUNTER — Ambulatory Visit (HOSPITAL_COMMUNITY): Payer: Medicare Other

## 2021-12-15 ENCOUNTER — Ambulatory Visit (HOSPITAL_COMMUNITY): Payer: Medicare Other

## 2022-01-24 ENCOUNTER — Other Ambulatory Visit: Payer: Self-pay | Admitting: Gastroenterology

## 2022-01-24 DIAGNOSIS — K219 Gastro-esophageal reflux disease without esophagitis: Secondary | ICD-10-CM

## 2022-02-28 ENCOUNTER — Other Ambulatory Visit: Payer: Self-pay | Admitting: Gastroenterology

## 2022-02-28 DIAGNOSIS — K219 Gastro-esophageal reflux disease without esophagitis: Secondary | ICD-10-CM

## 2022-04-26 ENCOUNTER — Other Ambulatory Visit: Payer: Self-pay | Admitting: Gastroenterology

## 2022-04-26 DIAGNOSIS — K219 Gastro-esophageal reflux disease without esophagitis: Secondary | ICD-10-CM

## 2022-04-28 ENCOUNTER — Other Ambulatory Visit: Payer: Self-pay | Admitting: Gastroenterology

## 2022-04-28 DIAGNOSIS — K219 Gastro-esophageal reflux disease without esophagitis: Secondary | ICD-10-CM

## 2022-09-09 ENCOUNTER — Other Ambulatory Visit: Payer: Self-pay | Admitting: Physician Assistant

## 2022-09-09 DIAGNOSIS — Z1231 Encounter for screening mammogram for malignant neoplasm of breast: Secondary | ICD-10-CM

## 2022-09-28 ENCOUNTER — Encounter: Payer: Self-pay | Admitting: Gastroenterology

## 2022-09-28 ENCOUNTER — Other Ambulatory Visit: Payer: Medicare Other

## 2022-09-28 ENCOUNTER — Ambulatory Visit (INDEPENDENT_AMBULATORY_CARE_PROVIDER_SITE_OTHER): Payer: Medicare Other | Admitting: Gastroenterology

## 2022-09-28 VITALS — BP 122/80 | HR 75 | Ht 61.0 in | Wt 144.0 lb

## 2022-09-28 DIAGNOSIS — K2100 Gastro-esophageal reflux disease with esophagitis, without bleeding: Secondary | ICD-10-CM

## 2022-09-28 DIAGNOSIS — R198 Other specified symptoms and signs involving the digestive system and abdomen: Secondary | ICD-10-CM | POA: Diagnosis not present

## 2022-09-28 DIAGNOSIS — Z800 Family history of malignant neoplasm of digestive organs: Secondary | ICD-10-CM | POA: Diagnosis not present

## 2022-09-28 DIAGNOSIS — K219 Gastro-esophageal reflux disease without esophagitis: Secondary | ICD-10-CM

## 2022-09-28 MED ORDER — PANTOPRAZOLE SODIUM 40 MG PO TBEC: DELAYED_RELEASE_TABLET | ORAL | 3 refills | Status: DC

## 2022-09-28 NOTE — Progress Notes (Signed)
    Assessment     GERD with LA grade A esophagitis. Barrett's found in 2011 however not found on 2021 EGD Alternating diarrhea and constipation: possible celiac disease, history of collagenous colitis Family history of colon cancer, first-degree relative   Recommendations    Continue pantoprazole 40 mg po bid and follow antireflux measures No plans for future Barrett's surveillance given negative findings on 2021 EGD tTG, IgA Colonoscopy due in July 2024   HPI    This is a 76 year old female returning for follow-up of GERD.  She notes frequent regurgitation when she bends over after a meal or first thing in the morning. EGD in 2011 showed Barrett's however Barrett's was not found on EGD in 2021. 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 She was evaluated by JL in Jan 2023 in the office for GERD and dysphagia. Barium esophagram and GES were scheduled however they were not completed.  She relates alternating diarrhea and constipation with no specific pattern and no specific food or beverage intolerances noted.  She does not follow a gluten-free diet.  Denies weight loss, abdominal pain, change in stool caliber, melena, hematochezia, nausea, vomiting, dysphagia, chest pain.  EGD Nov 2021: Variable z-line (intestinal metaplasia was not noted) LA Grade A esophagitis Small hiatal hernia Retained gastric solids   Labs / Imaging       Latest Ref Rng & Units 01/29/2021    3:20 PM 05/17/2019    7:18 PM  Hepatic Function  Total Protein 6.5 - 8.1 g/dL 7.1  5.2   Albumin 3.5 - 5.0 g/dL 4.4  2.8   AST 15 - 41 U/L 28  23   ALT 0 - 44 U/L 28  22   Alk Phosphatase 38 - 126 U/L 76  90   Total Bilirubin 0.3 - 1.2 mg/dL 0.6  0.8        Latest Ref Rng & Units 01/29/2021    3:20 PM 09/21/2020    7:18 AM 05/18/2019    5:52 AM  CBC  WBC 4.0 - 10.5 K/uL 6.4  6.0  5.6   Hemoglobin 12.0 - 15.0 g/dL 12.7  11.5  8.8   Hematocrit 36.0 - 46.0 % 39.9  36.1  27.0    Platelets 150 - 400 K/uL 232  203  211     Current Medications, Allergies, Past Medical History, Past Surgical History, Family History and Social History were reviewed in [employer] record.   Physical Exam: General: Well developed, well nourished, no acute distress Head: Normocephalic and atraumatic Eyes: Sclerae anicteric, EOMI Ears: Normal auditory acuity Mouth: Not examined Lungs: Clear throughout to auscultation Heart: Regular rate and rhythm; no murmurs, rubs or bruits Abdomen: Soft, non tender and non distended. No masses, hepatosplenomegaly or hernias noted. Normal Bowel sounds Rectal: Not done Musculoskeletal: Symmetrical with no gross deformities  Pulses:  Normal pulses noted Extremities: No clubbing, cyanosis, edema or deformities noted Neurological: Alert oriented x 4, grossly nonfocal Psychological:  Alert and cooperative. Normal mood and affect   Joia Doyle T. Fuller Plan, MD 09/28/2022, 10:42 AM

## 2022-09-28 NOTE — Patient Instructions (Addendum)
We have sent the following medications to your pharmacy for you to pick up at your convenience: pantoprazole 40 mg twice daily.  Your provider has requested that you go to the basement level for lab work before leaving today. Press "B" on the elevator. The lab is located at the first door on the left as you exit the elevator.  The [facility] providers would like to encourage you to use [facility] to communicate with providers for non-urgent requests or questions.  Due to long hold times on the telephone, sending your provider a message by [facility] may be a faster and more efficient way to get a response.  Please allow 48 business hours for a response.  Please remember that this is for non-urgent requests.   Due to recent changes in healthcare laws, you may see the results of your imaging and laboratory studies on MyChart before your provider has had a chance to review them.  We understand that in some cases there may be results that are confusing or concerning to you. Not all laboratory results come back in the same time frame and the provider may be waiting for multiple results in order to interpret others.  Please give [place] 48 hours in order for your provider to thoroughly review all the results before contacting the office for clarification of your results.   Thank you for choosing me and  Gastroenterology.  Venita Lick. Pleas Koch., MD., Clementeen Graham

## 2022-09-30 LAB — TISSUE TRANSGLUTAMINASE, IGA: (tTG) Ab, IgA: <1 U/mL

## 2022-09-30 LAB — IGA: Immunoglobulin A: 114 mg/dL (ref 70–320)

## 2022-10-04 ENCOUNTER — Telehealth: Payer: Self-pay | Admitting: Gastroenterology

## 2022-10-04 NOTE — Telephone Encounter (Signed)
Refer to lab result note 09/28/22. 

## 2022-10-04 NOTE — Telephone Encounter (Signed)
Inbound call from pt states she is returning a call regarding lab results. She says to leave results on her voicemail if she does not pick up next time. Please advise

## 2022-10-10 ENCOUNTER — Telehealth: Payer: Self-pay | Admitting: Gastroenterology

## 2022-10-10 ENCOUNTER — Other Ambulatory Visit: Payer: Self-pay

## 2022-10-10 DIAGNOSIS — R1013 Epigastric pain: Secondary | ICD-10-CM

## 2022-10-10 DIAGNOSIS — K2100 Gastro-esophageal reflux disease with esophagitis, without bleeding: Secondary | ICD-10-CM

## 2022-10-10 MED ORDER — FAMOTIDINE 40 MG PO TABS: 40.0000 mg | ORAL_TABLET | Freq: Two times a day (BID) | ORAL | 3 refills | Status: DC

## 2022-10-10 NOTE — Telephone Encounter (Signed)
Left message for patient to call back  

## 2022-10-10 NOTE — Telephone Encounter (Signed)
Patient called in with complaints of ongoing upper abdominal pain & burning in her esophagus. Currently taking pantoprazole twice a day & tums daily. Pepto just as needed. She says if she bends over at the waist within a couple hours after eating she will feel the urge to vomit. She has a history of a perforated ulcer, and is concerned that this could be the start of one. Last seen in [facility] on 09/28/22 with Dr. Russella Dar. Will route to MD for further recommendations.

## 2022-10-10 NOTE — Telephone Encounter (Signed)
Spoke with patient regarding MD recommendations. Prescription sent to pharmacy. EGD scheduled for 10/17/22 at 10:00 am in the [facility] with Dr. Russella Dar. Amb ref placed. No blood thinners or diabetic. Patient given instructions over the phone & verbalized all understanding. Also mailed a copy home.

## 2022-10-10 NOTE — Telephone Encounter (Signed)
Patient is calling states her symptoms are worsening feels like its burning a hole in her esophagus states she is taking tums and pepto bismal every day. She is wondering if she needs a higher dosage of Protonix or if there's anything else that would help. Please advise

## 2022-10-10 NOTE — Telephone Encounter (Signed)
Symptoms are typical for GERD. Reflux and regurgitation with bending will likely not be helped with medications. Unlikely an ulcer.   Add famotidine 40 mg po bid. Take pantoprazole 40 mg po bid 30 minutes before meals. Follow antireflux measures. Schedule EGD to further evaluate.

## 2022-10-16 ENCOUNTER — Encounter: Payer: Self-pay | Admitting: Certified Registered Nurse Anesthetist

## 2022-10-17 ENCOUNTER — Encounter: Payer: Self-pay | Admitting: Gastroenterology

## 2022-10-17 ENCOUNTER — Ambulatory Visit (AMBULATORY_SURGERY_CENTER): Payer: Medicare Other | Admitting: Gastroenterology

## 2022-10-17 VITALS — BP 114/70 | HR 94 | Temp 97.8°F | Resp 12 | Ht 61.0 in | Wt 144.0 lb

## 2022-10-17 DIAGNOSIS — K2970 Gastritis, unspecified, without bleeding: Secondary | ICD-10-CM

## 2022-10-17 DIAGNOSIS — R1013 Epigastric pain: Secondary | ICD-10-CM | POA: Diagnosis not present

## 2022-10-17 DIAGNOSIS — K2100 Gastro-esophageal reflux disease with esophagitis, without bleeding: Secondary | ICD-10-CM

## 2022-10-17 DIAGNOSIS — R1310 Dysphagia, unspecified: Secondary | ICD-10-CM | POA: Diagnosis not present

## 2022-10-17 MED ORDER — SODIUM CHLORIDE 0.9 % IV SOLN: 500.0000 mL | INTRAVENOUS | Status: DC

## 2022-10-17 NOTE — Patient Instructions (Addendum)
Thank you for coming in to see [place] today! Resume your  medications today. Return to regular daily activities today. Biopsy results will be available in 1-2 weeks.  Clear liquids for 2 hours after discharge today.  Then advance to a soft foods diet for the remainder of the day.  Follow anti-reflux measures long term.   Handout given.  We will call to schedule a gastric emptying scan and a follow up appointment with Dr Russella Dar.   YOU HAD AN ENDOSCOPIC PROCEDURE TODAY AT THE [facility]:   Refer to the procedure report that was given to you for any specific questions about what was found during the examination.  If the procedure report does not answer your questions, please call your gastroenterologist to clarify.  If you requested that your care partner not be given the details of your procedure findings, then the procedure report has been included in a sealed envelope for you to review at your convenience later.  YOU SHOULD EXPECT: Some feelings of bloating in the abdomen. Passage of more gas than usual.  Walking can help get rid of the air that was put into your GI tract during the procedure and reduce the bloating. If you had a lower endoscopy (such as a colonoscopy or flexible sigmoidoscopy) you may notice spotting of blood in your stool or on the toilet paper. If you underwent a bowel prep for your procedure, you may not have a normal bowel movement for a few days.  Please Note:  You might notice some irritation and congestion in your nose or some drainage.  This is from the oxygen used during your procedure.  There is no need for concern and it should clear up in a day or so.  SYMPTOMS TO REPORT  Following upper endoscopy (EGD)  Vomiting of blood or coffee ground material  New chest pain or pain under the shoulder blades  Painful or persistently difficult swallowing  New shortness of breath  Fever of 100F or higher  Black, tarry-looking stools  For urgent or emergent  issues, a gastroenterologist can be reached at any hour by calling [phone]. Do not use MyChart messaging for urgent concerns.    DIET:  We do recommend a small meal at first, but then you may proceed to your regular diet.  Drink plenty of fluids but you should avoid alcoholic beverages for 24 hours.  ACTIVITY:  You should plan to take it easy for the rest of today and you should NOT DRIVE or use heavy machinery until tomorrow (because of the sedation medicines used during the test).    FOLLOW UP: Our staff will call the number listed on your records the next business day following your procedure.  We will call around 7:15- 8:00 am to check on you and address any questions or concerns that you may have regarding the information given to you following your procedure. If we do not reach you, we will leave a message.     If any biopsies were taken you will be contacted by phone or by letter within the next 1-3 weeks.  Please call us at [phone] if you have not heard about the biopsies in 3 weeks.    SIGNATURES/CONFIDENTIALITY: You and/or your care partner have signed paperwork which will be entered into your electronic medical record.  These signatures attest to the fact that that the information above on your After Visit Summary has been reviewed and is understood.  Full responsibility of the confidentiality  of this discharge information lies with you and/or your care-partner.   YOU HAD AN ENDOSCOPIC PROCEDURE TODAY AT [facility]:   Refer to the procedure report that was given to you for any specific questions about what was found during the examination.  If the procedure report does not answer your questions, please call your gastroenterologist to clarify.  If you requested that your care partner not be given the details of your procedure findings, then the procedure report has been included in a sealed envelope for you to review at your convenience later.  YOU SHOULD  EXPECT: Some feelings of bloating in the abdomen. Passage of more gas than usual.  Walking can help get rid of the air that was put into your GI tract during the procedure and reduce the bloating. If you had a lower endoscopy (such as a colonoscopy or flexible sigmoidoscopy) you may notice spotting of blood in your stool or on the toilet paper. If you underwent a bowel prep for your procedure, you may not have a normal bowel movement for a few days.  Please Note:  You might notice some irritation and congestion in your nose or some drainage.  This is from the oxygen used during your procedure.  There is no need for concern and it should clear up in a day or so.  SYMPTOMS TO REPORT IMMEDIATELY:  Following lower endoscopy (colonoscopy or flexible sigmoidoscopy):  Excessive amounts of blood in the stool  Significant tenderness or worsening of abdominal pains  Swelling of the abdomen that is new, acute  Fever of 100F or higher  Following upper endoscopy (EGD)  Vomiting of blood or coffee ground material  New chest pain or pain under the shoulder blades  Painful or persistently difficult swallowing  New shortness of breath  Fever of 100F or higher  Black, tarry-looking stools  For urgent or emergent issues, a gastroenterologist can be reached at any hour by calling [phone]. Do not use MyChart messaging for urgent concerns.    DIET:  We do recommend a small meal at first, but then you may proceed to your regular diet.  Drink plenty of fluids but you should avoid alcoholic beverages for 24 hours.  ACTIVITY:  You should plan to take it easy for the rest of today and you should NOT DRIVE or use heavy machinery until tomorrow (because of the sedation medicines used during the test).    FOLLOW UP: Our staff will call the number listed on your records the next business day following your procedure.  We will call around 7:15- 8:00 am to check on you and address any questions or concerns that  you may have regarding the information given to you following your procedure. If we do not reach you, we will leave a message.     If any biopsies were taken you will be contacted by phone or by letter within the next 1-3 weeks.  Please call us at [phone] if you have not heard about the biopsies in 3 weeks.    SIGNATURES/CONFIDENTIALITY: You and/or your care partner have signed paperwork which will be entered into your electronic medical record.  These signatures attest to the fact that that the information above on your After Visit Summary has been reviewed and is understood.  Full responsibility of the confidentiality of this discharge information lies with you and/or your care-partner.

## 2022-10-17 NOTE — Progress Notes (Signed)
See 09/28/2022 H&P. She is again complaining of dysphagia, otherwise no changes

## 2022-10-17 NOTE — Progress Notes (Signed)
0959 Robinul 0.1 mg IV given due large amount of secretions upon assessment.  MD made aware, vss 

## 2022-10-17 NOTE — Progress Notes (Signed)
Vital signs checked by:DT  The medical and surgical history was reviewed and verified with the patient.  

## 2022-10-17 NOTE — Progress Notes (Signed)
Report given to PACU, vss 

## 2022-10-17 NOTE — Op Note (Signed)
[facility] Patient Name: Tanya Duncan Procedure Date: 10/17/2022 9:53 AM MRN: [id number] Endoscopist: Meryl Dare , MD, [id number] Age: 76 Referring MD:  Date of Birth: November 23, 1945 Gender: Female Account #: [id number] Procedure:                Upper GI endoscopy Indications:              Epigastric abdominal pain, Dysphagia,                            Gastroesophageal reflux disease Medicines:                Monitored Anesthesia Care Procedure:                Pre-Anesthesia Assessment:                           - Prior to the procedure, a History and Physical                            was performed, and patient medications and                            allergies were reviewed. The patient's tolerance of                            previous anesthesia was also reviewed. The risks                            and benefits of the procedure and the sedation                            options and risks were discussed with the patient.                            All questions were answered, and informed consent                            was obtained. Prior Anticoagulants: The patient has                            taken no anticoagulant or antiplatelet agents. ASA                            Grade Assessment: II - A patient with mild systemic                            disease. After reviewing the risks and benefits,                            the patient was deemed in satisfactory condition to                            undergo the procedure.  After obtaining informed consent, the endoscope was                            passed under direct vision. Throughout the                            procedure, the patient's blood pressure, pulse, and                            oxygen saturations were monitored continuously. The                            Endoscope was introduced through the mouth, and                            advanced to the second part of  duodenum. The upper                            GI endoscopy was accomplished without difficulty.                            The patient tolerated the procedure well. Scope In: Scope Out: Findings:                 LA Grade B (one or more mucosal breaks greater than                            5 mm, not extending between the tops of two mucosal                            folds) esophagitis with no bleeding was found in                            the distal and mid esophagus.                           No endoscopic abnormality was evident in the                            esophagus to explain the patient's complaint of                            dysphagia. It was decided, however, to proceed with                            dilation of the entire esophagus. The scope was                            withdrawn. Dilation was performed with a Maloney                            dilator with no resistance at 48 Fr. No heme noted  A medium amount of food (residue) was found on the                            greater curvature of the stomach.                           Patchy mildly erythematous mucosa without bleeding                            was found in the gastric fundus and in the gastric                            body. Biopsies were taken with a cold forceps for                            histology.                           A deformity was found at the pylorus.                           The exam of the stomach was otherwise normal.                           The duodenal bulb and second portion of the                            duodenum were normal. Complications:            No immediate complications. Estimated Blood Loss:     Estimated blood loss was minimal. Impression:               - LA Grade B reflux esophagitis with no bleeding.                           - No endoscopic esophageal abnormality to explain                            patient's dysphagia. Esophagus  dilated.                           - A medium amount of food (residue) in the stomach.                           - Erythematous mucosa in the gastric body and                            fundus. Biopsied.                           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                           - Normal duodenal bulb and second portion of the  duodenum. Recommendation:           - Patient has a contact number available for                            emergencies. The signs and symptoms of potential                            delayed complications were discussed with the                            patient. Return to normal activities tomorrow.                            Written discharge instructions were provided to the                            patient.                           - Clear liquid diet for 2 hours, then advance as                            tolerated to soft diet today.                           - Advance to low reside, fat modified,                            gastroparesis diet tomorrow.                           - Follow antireflux measures long term.                           - Continue present medications.                           - Await pathology results.                           - Schedule gastric emptying scan.                           - GI office appt in 6 weeks. Meryl Dare, MD 10/17/2022 10:25:14 AM This report has been signed electronically.

## 2022-10-18 ENCOUNTER — Telehealth: Payer: Self-pay | Admitting: *Deleted

## 2022-10-18 ENCOUNTER — Other Ambulatory Visit: Payer: Self-pay

## 2022-10-18 DIAGNOSIS — R1013 Epigastric pain: Secondary | ICD-10-CM

## 2022-10-18 DIAGNOSIS — R1012 Left upper quadrant pain: Secondary | ICD-10-CM

## 2022-10-18 NOTE — Telephone Encounter (Signed)
Scheduled patient for first available CT AP at [facility] on 10/26/22 at 4:00 pm, needs to arrive by 3:45 pm, NPO 4 hours prior. Patient is aware & has been advised to call back if her symptoms worsen prior to then.

## 2022-10-18 NOTE — Telephone Encounter (Signed)
  Follow up Call-     10/17/2022    9:28 AM 12/18/2020    7:50 AM 09/18/2020    9:07 AM  Call back number  Post procedure Call Back phone  # [phone]  Permission to leave phone message Yes  Yes     Patient questions:  Do you have a fever, pain , or abdominal swelling? Yes.   Pain Score  8 * Pt c/o 8/10 Left abdominal pain that is worse since procedure. Reports the pain as sharp and constant with no relief. She states she tried ice, heat and Tylenol with no relief in pain. Reports she had the pain in the same location prior to the procedure but not in this severity. Stated the pain she had before was usually first thing in the morning and then it would go away on it's own. RN to make MD aware and await further recommendations.   Have you tolerated food without any problems? Yes.    Have you been able to return to your normal activities? Yes.    Do you have any questions about your discharge instructions: Diet   No. Medications  No. Follow up visit  No.  Do you have questions or concerns about your Care? No.  Actions: * If pain score is 4 or above: Physician/ provider Notified : Claudette Head, MD. Date 10/18/2022  at Time 7:47 AM .

## 2022-10-21 NOTE — Telephone Encounter (Signed)
Pt has been made aware of follow up on 12/06/22 with Dr. Russella Dar. Will plan to attend.

## 2022-10-26 ENCOUNTER — Ambulatory Visit (HOSPITAL_COMMUNITY)
Admission: RE | Admit: 2022-10-26 | Discharge: 2022-10-26 | Disposition: A | Discharge status: Home / Self Care | Payer: Medicare Other | Source: Ambulatory Visit | Attending: Gastroenterology | Admitting: Gastroenterology

## 2022-10-26 DIAGNOSIS — R1013 Epigastric pain: Secondary | ICD-10-CM | POA: Insufficient documentation

## 2022-10-26 DIAGNOSIS — R1012 Left upper quadrant pain: Secondary | ICD-10-CM | POA: Diagnosis present

## 2022-10-26 MED ORDER — IOHEXOL 300 MG/ML  SOLN
100.0000 mL | Freq: Once | INTRAMUSCULAR | Status: AC | PRN
  Administered 2022-10-26: 100 mL via INTRAVENOUS

## 2022-10-26 MED ORDER — SODIUM CHLORIDE (PF) 0.9 % IJ SOLN
INTRAMUSCULAR | Status: AC
  Filled 2022-10-26: qty 50

## 2022-11-01 ENCOUNTER — Other Ambulatory Visit: Payer: Self-pay

## 2022-11-01 DIAGNOSIS — R9389 Abnormal findings on diagnostic imaging of other specified body structures: Secondary | ICD-10-CM

## 2022-11-01 DIAGNOSIS — R1012 Left upper quadrant pain: Secondary | ICD-10-CM

## 2022-11-03 ENCOUNTER — Encounter: Payer: Self-pay | Admitting: Gastroenterology

## 2022-11-09 ENCOUNTER — Ambulatory Visit (HOSPITAL_COMMUNITY): Payer: Medicare Other

## 2022-11-18 ENCOUNTER — Ambulatory Visit (HOSPITAL_COMMUNITY): Payer: Medicare Other | Attending: Gastroenterology

## 2022-11-18 ENCOUNTER — Encounter (HOSPITAL_COMMUNITY): Payer: Self-pay

## 2022-12-01 ENCOUNTER — Telehealth: Payer: Self-pay | Admitting: Gastroenterology

## 2022-12-01 NOTE — Telephone Encounter (Signed)
Patient called, stated she wanted to let the provider know she has been having soft stools for the past month. Patient states if she needs to bring a stool sample in or if she needs a test done before her appointment on 1/30. Please advise.

## 2022-12-01 NOTE — Telephone Encounter (Signed)
The pt states she has soft stools for the past month and not sure if she will need a stool test prior to her up coming appt.  I have advised her to keep appt and further testing or treatment will be discussed at her appt

## 2022-12-06 ENCOUNTER — Ambulatory Visit: Payer: Medicare Other | Admitting: Gastroenterology

## 2022-12-06 ENCOUNTER — Telehealth: Payer: Self-pay | Admitting: Gastroenterology

## 2022-12-06 NOTE — Telephone Encounter (Signed)
Good morning Dr. Fuller Plan,    Patient called stating she would not be able to come into her appointment with you this morning at 10:30 due to her hurting her back.   Patient was rescheduled for 2/27 at 10:50

## 2022-12-07 ENCOUNTER — Ambulatory Visit: Payer: Medicare Other | Attending: Family Medicine

## 2022-12-07 DIAGNOSIS — M069 Rheumatoid arthritis, unspecified: Secondary | ICD-10-CM | POA: Diagnosis present

## 2022-12-07 DIAGNOSIS — M4326 Fusion of spine, lumbar region: Secondary | ICD-10-CM | POA: Diagnosis present

## 2022-12-07 DIAGNOSIS — M5459 Other low back pain: Secondary | ICD-10-CM | POA: Insufficient documentation
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
## 2022-12-07 NOTE — Patient Instructions (Signed)
Aquatic Therapy at [facility]-  What to Expect! ? ?Where:  ? ?[facility] ?[address] ?[place], [place] [address] ?Rehab phone [phone] ? ?NOTE:  You will receive an automated phone message reminding you of your appt and it will say the appointment is at the [address] [facility] [address] [facility] clinic. ? ?        ?How to Prepare: ?Please make sure you drink 8 ounces of water about one hour prior to your pool session ?A caregiver may attend if needed with the patient to help assist as needed. A caregiver can sit in the pool room on chair. ?Please arrive IN YOUR SUIT and 15 minutes prior to your appointment - this helps to avoid delays in starting your session. ?Please make sure to attend to any toileting needs prior to entering the pool ?Locker rooms for changing are provided.   There is direct access to the pool deck form the locker room.  You can lock your belongings in a locker with lock provided. ?Once on the pool deck your therapist will ask if you have signed the Patient  Consent and Assignment of Benefits form before beginning treatment ?Your therapist may take your blood pressure prior to, during and after your session if indicated ?We usually try and create a home exercise program based on activities we do in the pool.  Please be thinking about who might be able to assist you in the pool should you need to participate in an aquatic home exercise program at the time of discharge if you need assistance.  Some patients do not want to or do not have the ability to participate in an aquatic home program - this is not a barrier in any way to you participating in aquatic therapy as part of your current therapy plan! ?After Discharge from PT, you can continue using home program at  the [facility]/, there is a drop-in fee for $5 ($45 a month)or for 60 years  or older $4.00 ($40 a month for seniors ) or any local [employer] pool.  Memberships for purchase are  available for gym/pool at [facility] ? ?IT IS VERY IMPORTANT THAT YOUR LAST VISIT BE IN THE CLINIC AT [address] AFTER YOUR LAST AQUATIC VISIT.  PLEASE MAKE SURE THAT YOU HAVE A [address]  APPOINTMENT SCHEDULED. ? ? ?About the pool: ?Pool is located approximately 500 FT from the entrance of the building.  ?Please bring a support person if you need assistance traveling this      distance.  ? ?Your therapist will assist you in entering the water; there are two ways to     ?      enter: stairs with railings, and a mechanical lift. Your therapist will determine the most appropriate way for you. ? ?Water temperature is usually between 88-90 degrees ? ?There may be up to 2 other swimmers in the pool at the same time ? ?The pool deck is tile, please wear shoes with good traction if you prefer not to be barefoot.  ? ? ?Contact Info:  ?For appointment scheduling and cancellations:         Please call the [facility]  PH:[phone]              Aquatic Therapy  ?[facility]       All sessions are 45 minutes ? ? ? ? ?        ? ?                ?                      ?

## 2022-12-15 ENCOUNTER — Ambulatory Visit: Payer: Medicare Other | Admitting: Physical Therapy

## 2022-12-15 NOTE — Therapy (Deleted)
OUTPATIENT PHYSICAL THERAPY TREATMENT NOTE   Patient Name: Tanya Duncan MRN: [id number] DOB:1946-04-25, 77 y.o., female Today's Date: 12/15/2022  PCP: Bernerd Limbo, MD    REFERRING PROVIDER: Mindi Curling, PA-C     Past Medical History:  Diagnosis Date   Anxiety    Arthritis    Blood transfusion without reported diagnosis 05/2019   with hip surgery   Fibromyalgia    Fractured pelvis ([place]) 09/15/2020   right   GERD (gastroesophageal reflux disease)    GI bleed 3220,2542   x2   Hyperlipidemia    Pneumonia 2005   Stomach ulcer 1990   Past Surgical History:  Procedure Laterality Date   ABDOMINAL HYSTERECTOMY     FRACTURE SURGERY Right 1998   ankle   JOINT REPLACEMENT     LUMBAR FUSION  2015   OPEN REDUCTION INTERNAL FIXATION (ORIF) PROXIMAL PHALANX Right 09/21/2020   Procedure: OPEN TREATMENT OF RIGHT LONG FINGER PROXIMAL PHALANX FRACTURE;  Surgeon: Milly Jakob, MD;  Location: [place];  Service: Orthopedics;  Laterality: Right;  LENGTH OF SURGERY: 75 MIN   ORIF HUMERUS FRACTURE Left    ORIF TIBIA FRACTURE Right 1998   with bone graft   PYLOROPLASTY     REVERSE SHOULDER ARTHROPLASTY Left 02/04/2021   Procedure: REVERSE SHOULDER ARTHROPLASTY WITH HARDWARE REMOVAL;  Surgeon: Tania Ade, MD;  [facility];  Service: Orthopedics;  Laterality: Left;   TOTAL HIP ARTHROPLASTY  2011   TOTAL HIP ARTHROPLASTY Right 05/14/2019   Procedure: Right Anterior Hip Arthroplasty;  Surgeon: Melrose Nakayama, MD;  Location[facility];  Service: Orthopedics;  Laterality: Right;   TOTAL SHOULDER REPLACEMENT  2014   Patient Active Problem List   Diagnosis Date Noted   Palpitations 09/01/2020   Chest pain 09/01/2020   Abnormal ECG 09/01/2020   Moderate episode of recurrent major depressive disorder ([place]) 06/22/2020   Prediabetes 06/22/2020   Mixed hyperlipidemia 06/22/2020   History of total right hip arthroplasty 05/17/2019   Anxiety    Primary osteoarthritis of right hip  05/14/2019   Vitamin D deficiency 01/10/2019   Insomnia due to anxiety and fear 10/25/2017   Chronic prescription benzodiazepine use 10/25/2017   Chronic bilateral low back pain without sciatica 04/27/2017   Osteoarthritis 01/06/2017   GAD (generalized anxiety disorder) 01/06/2017   Rheumatoid arthritis involving multiple sites with positive rheumatoid factor ([place]) 12/12/2016   Status post lumbar spinal fusion 12/12/2016   Fibromyalgia 12/12/2016    THERAPY DIAG:  No diagnosis found.   Rationale for Evaluation and Treatment Rehabilitation  REFERRING DIAG: M05.79 (ICD-10-CM) - Rheumatoid arthritis with rheumatoid factor of multiple sites without organ or systems involvement M54.50 (ICD-10-CM) - Low back pain, unspecified G89.29 (ICD-10-CM) - Other chronic pain   PERTINENT HISTORY: Lumbar fusion and foraminotomy 10 years ago, B THA, hx of L TSA reverse   PRECAUTIONS/RESTRICTIONS:   lumbar fusion, RA  SUBJECTIVE:  ***  PAIN:  Are you having pain? Yes: NPRS scale: 8/10 Pain location: sacral region  Pain description: ache  Aggravating factors: activity Relieving factors: meds and thermal modalities  OBJECTIVE: (objective measures completed at initial evaluation unless otherwise dated)  DIAGNOSTIC FINDINGS:  CLINICAL DATA:  Sacral pain since the patient suffered a fall when she was pulled down by her dog 09/08/2020.   EXAM: MRI LUMBAR SPINE WITHOUT CONTRAST   TECHNIQUE: Multiplanar, multisequence MR imaging of the lumbar spine was performed. No intravenous contrast was administered.   COMPARISON:  CT of the right hip 10/20/2020. MRI of  the right hip 02/01/2020.   FINDINGS: Bones/Joint/Cartilage   There is marrow edema throughout the left sacrum due to a mildly displaced fracture. A small focus of marrow edema is seen in the left ilium adjacent to the SI joint consistent with nondisplaced fracture. Also seen is a mild inferior endplate compression fracture of L5  eccentric to the right with vertebral body height loss of approximately 10% and associated marrow edema.   There is partial visualization of lumbar fusion hardware extending from L4 above the superior aspect of the scan. Artifact from bilateral hip replacements also noted. No worrisome lesion is identified.   Ligaments   Negative.   Muscles and Tendons   Intact.   Soft tissues   Sigmoid diverticulosis noted.   IMPRESSION: Acute or subacute minimally displaced fracture of the left sacrum. There is also a small fracture in the left ilium adjacent to the SI joint.   Acute or subacute mild inferior endplate compression fracture L5.   Diverticulosis.     Electronically Signed   By: Inge Rise M.D.   On: 12/16/2020 09:57   PATIENT SURVEYS:  FOTO 32(44 predicted)   SCREENING FOR RED FLAGS: Bowel or bladder incontinence: No   COGNITION: Overall cognitive status: Within functional limits for tasks assessed                          SENSATION: Not tested   MUSCLE LENGTH: Hamstrings: Right 90 deg; Left 90 deg     POSTURE: decreased lumbar lordosis and side bent R with R knee flexed  in valgus with pronated R ankle   PALPATION: NT   LUMBAR ROM:    AROM eval  Flexion 75%  Extension 25%  Right lateral flexion 25%  Left lateral flexion 25%  Right rotation 90%  Left rotation 75%   (Blank rows = not tested)   LOWER EXTREMITY ROM:   WFL   Active  Right eval Left eval  Hip flexion      Hip extension      Hip abduction      Hip adduction      Hip internal rotation      Hip external rotation      Knee flexion      Knee extension      Ankle dorsiflexion      Ankle plantarflexion      Ankle inversion      Ankle eversion       (Blank rows = not tested)   LOWER EXTREMITY MMT:  [facility]   MMT Right eval Left eval  Hip flexion      Hip extension      Hip abduction      Hip adduction      Hip internal rotation      Hip external rotation      Knee  flexion      Knee extension      Ankle dorsiflexion      Ankle plantarflexion      Ankle inversion      Ankle eversion      Core/trunk 3 3   (Blank rows = not tested)   LUMBAR SPECIAL TESTS:  Deferred due to fusion   FUNCTIONAL TESTS:  NT   GAIT: Distance walked: 71f x2 Assistive device utilized: None Level of assistance: Complete Independence Comments: antalgic     HOME EXERCISE PROGRAM: Access Code: [email or web address]: [email or web address] Date: 12/07/2022 Prepared by: JSharlynn Oliphant  Exercises - Supine Posterior Pelvic Tilt  - 2 x daily - 5 x weekly - 1 sets - 10 reps - 3s hold - Supine 90/90 Alternating Heel Touches with Posterior Pelvic Tilt  - 2 x daily - 5 x weekly - 1 sets - 2 reps - 30s hold - Supine 90/90 Abdominal Bracing  - 2 x daily - 5 x weekly - 1 sets - 2 reps - 30s hold  TREATMENT 12/15/22:  Aquatic therapy at [address] - therapeutic pool temp 92 degrees Pt enters building independently.  Treatment took place in water 3.8 to  4 ft 8 in.feet deep depending upon activity.  Pt entered and exited the pool via stair and handrails    Aquatic Therapy:  Water walking for warm up fwd/lat/bkwds  Standing: Standing march Hamstring curl x20 BIL Hip abd/add x20 BIL Hip Circles CC/CCW 2x10 each BIL Heel raises - x20 Hip ext/flex with knee straight x 20 BIL Yellow noodle stomp x20 ea Squats x20 Lunge fwd x20 Lunge lateral x20 Step ups on submerged step 2x10 BIL Step up and overs on submerged step 2x10 BIL  Core/shoulders: Kickboard push down Yellow noodle push down Riding noodle across pool using arms for propulsion reverse Water bell push pull bil alternating in squat Water bell push pull alternating in squat Water bell alternating flexion/ext in squat Water bell bil flexion/ext in squat  Gait/LE: Lateral walking with dumbell add/abd Walking march With PF Walking lunge Walking lateral lunge Cross over  walking  Balance: *** stance w/ bil shoulder flexion/ext *** stance w/ alternating shoulder flexion/ext *** stance w/ bil shoulder flexion/ext w/ waterbell resistance *** stance w/ alternating shoulder flexion/ext w/ waterbell resistance  Sitting on bench in water: Bicycle kicks x1' Reverse bicycle kicks x1' Flutter kicks x1' Scissor kicks x1'  Stretching: Runners stretch on bottom step x30" BIL Hamstring stretch on bottom step x30" BIL Figure 4 squat stretch, BIL UE support 2x30" BIL  Pt requires the buoyancy of water for active assisted exercises with buoyancy supported for strengthening and AROM exercises. Hydrostatic pressure also supports joints by unweighting joint load by at least 50 % in 3-4 feet depth water. 80% in chest to neck deep water. Water will provide assistance with movement using the current and laminar flow while the buoyancy reduces weight bearing. Pt requires the viscosity of the water for resistance with strengthening exercises.   ASSESSMENT:   CLINICAL IMPRESSION: Session today focused on *** in the aquatic environment for use of buoyancy to offload joints and the viscosity of water as resistance during therapeutic exercise.  ***.  Patient was able to tolerate all prescribed exercises in the aquatic environment with no adverse effects and reports ***/10 pain at the end of the session. Patient continues to benefit from skilled PT services on land and aquatic based and should be progressed as able to improve functional independence.     OBJECTIVE IMPAIRMENTS: Abnormal gait, decreased activity tolerance, decreased balance, decreased mobility, difficulty walking, decreased ROM, decreased strength, impaired perceived functional ability, impaired flexibility, improper body mechanics, postural dysfunction, and pain.    ACTIVITY LIMITATIONS: carrying, lifting, bending, standing, squatting, and stairs   PERSONAL FACTORS: Age, Fitness, Past/current experiences, Time  since onset of injury/illness/exacerbation, and 1 comorbidity: RA  are also affecting patient's functional outcome.    REHAB POTENTIAL: Good   CLINICAL DECISION MAKING: Evolving/moderate complexity   EVALUATION COMPLEXITY: Moderate     GOALS: Goals reviewed with patient? Yes   SHORT TERM GOALS:  Target date: 12/28/2022     Begin aquatic program. Baseline: TBD Goal status: INITIAL   2.  Patient to demonstrate independence in HEP  Baseline: [id number] Goal status: INITIAL     LONG TERM GOALS: Target date: 01/18/2023     Increase trunk and core strength to 4/5 Baseline: 3/5 Goal status: INITIAL   2.  Increase FOTO score to 44 Baseline: 32 Goal status: INITIAL   3.  Patient to perform 5x STS with arms crossed in <20s Baseline: TBD Goal status: INITIAL   4.  Decrease worst pain to 6/10 Baseline: 8/10 worst pain Goal status: INITIAL     PLAN:   PT FREQUENCY: 2x/week   PT DURATION: 6 weeks   PLANNED INTERVENTIONS: Therapeutic exercises, Therapeutic activity, Neuromuscular re-education, Balance training, Gait training, Patient/Family education, Self Care, Joint mobilization, Stair training, Aquatic Therapy, Dry Needling, Manual therapy, and Re-evaluation.   PLAN FOR NEXT SESSION: Initiate aquatic therapy, core and trunk strengthening, postural exercises, stretching tasks    Kevan Ny Geraldene Eisel PT 12/15/2022, 7:05 AM
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
## 2022-12-21 ENCOUNTER — Ambulatory Visit: Payer: Medicare Other | Admitting: Physical Therapy

## 2022-12-29 ENCOUNTER — Ambulatory Visit: Payer: Medicare Other | Admitting: Physical Therapy

## 2022-12-29 NOTE — Therapy (Incomplete)
OUTPATIENT PHYSICAL THERAPY TREATMENT NOTE   Patient Name: Tanya Duncan MRN: [id number] DOB:September 17, 1946, 77 y.o., female Today's Date: 12/29/2022  PCP: Bernerd Limbo, MD   REFERRING PROVIDER: Mindi Curling, PA-C    END OF SESSION:    Past Medical History:  Diagnosis Date   Anxiety    Arthritis    Blood transfusion without reported diagnosis 05/2019   with hip surgery   Fibromyalgia    Fractured pelvis ([place]) 09/15/2020   right   GERD (gastroesophageal reflux disease)    GI bleed [id number]   x2   Hyperlipidemia    Pneumonia 2005   Stomach ulcer 1990   Past Surgical History:  Procedure Laterality Date   ABDOMINAL HYSTERECTOMY     FRACTURE SURGERY Right 1998   ankle   JOINT REPLACEMENT     LUMBAR FUSION  2015   OPEN REDUCTION INTERNAL FIXATION (ORIF) PROXIMAL PHALANX Right 09/21/2020   Procedure: OPEN TREATMENT OF RIGHT LONG FINGER PROXIMAL PHALANX FRACTURE;  Surgeon: Milly Jakob, MD;  Location: [facility];  Service: Orthopedics;  Laterality: Right;  LENGTH OF SURGERY: 75 MIN   ORIF HUMERUS FRACTURE Left    ORIF TIBIA FRACTURE Right 1998   with bone graft   PYLOROPLASTY     REVERSE SHOULDER ARTHROPLASTY Left 02/04/2021   Procedure: REVERSE SHOULDER ARTHROPLASTY WITH HARDWARE REMOVAL;  Surgeon: Tania Ade, MD;  [facility];  Service: Orthopedics;  Laterality: Left;   TOTAL HIP ARTHROPLASTY  2011   TOTAL HIP ARTHROPLASTY Right 05/14/2019   Procedure: Right Anterior Hip Arthroplasty;  Surgeon: Melrose Nakayama, MD;  Location[facility];  Service: Orthopedics;  Laterality: Right;   TOTAL SHOULDER REPLACEMENT  2014   Patient Active Problem List   Diagnosis Date Noted   Palpitations 09/01/2020   Chest pain 09/01/2020   Abnormal ECG 09/01/2020   Moderate episode of recurrent major depressive disorder ([place]) 06/22/2020   Prediabetes 06/22/2020   Mixed hyperlipidemia 06/22/2020   History of total right hip arthroplasty 05/17/2019   Anxiety    Primary osteoarthritis  of right hip 05/14/2019   Vitamin D deficiency 01/10/2019   Insomnia due to anxiety and fear 10/25/2017   Chronic prescription benzodiazepine use 10/25/2017   Chronic bilateral low back pain without sciatica 04/27/2017   Osteoarthritis 01/06/2017   GAD (generalized anxiety disorder) 01/06/2017   Rheumatoid arthritis involving multiple sites with positive rheumatoid factor ([place]) 12/12/2016   Status post lumbar spinal fusion 12/12/2016   Fibromyalgia 12/12/2016    REFERRING DIAG: M05.79 (ICD-10-CM) - Rheumatoid arthritis with rheumatoid factor of multiple sites without organ or systems involvement M54.50 (ICD-10-CM) - Low back pain, unspecified G89.29 (ICD-10-CM) - Other chronic pain   THERAPY DIAG:  No diagnosis found.  Rationale for Evaluation and Treatment Rehabilitation  PERTINENT HISTORY: Lumbar fusion and foraminotomy 10 years ago, B THA, hx of L TSA reverse   PRECAUTIONS: Other: lumbar fusion, RA  SUBJECTIVE:  SUBJECTIVE STATEMENT:  ***   PAIN:  Are you having pain? Yes: NPRS scale: *** 8/10 Pain location: sacral region  Pain description: ache  Aggravating factors: activity Relieving factors: meds and thermal modalities   OBJECTIVE: (objective measures completed at initial evaluation unless otherwise dated)  DIAGNOSTIC FINDINGS:  CLINICAL DATA:  Sacral pain since the patient suffered a fall when she was pulled down by her dog 09/08/2020.   EXAM: MRI LUMBAR SPINE WITHOUT CONTRAST   TECHNIQUE: Multiplanar, multisequence MR imaging of the lumbar spine was performed. No intravenous contrast was administered.   COMPARISON:  CT of the right hip 10/20/2020. MRI of the right hip 02/01/2020.   FINDINGS: Bones/Joint/Cartilage   There is marrow edema throughout the left sacrum due to a  mildly displaced fracture. A small focus of marrow edema is seen in the left ilium adjacent to the SI joint consistent with nondisplaced fracture. Also seen is a mild inferior endplate compression fracture of L5 eccentric to the right with vertebral body height loss of approximately 10% and associated marrow edema.   There is partial visualization of lumbar fusion hardware extending from L4 above the superior aspect of the scan. Artifact from bilateral hip replacements also noted. No worrisome lesion is identified.   Ligaments   Negative.   Muscles and Tendons   Intact.   Soft tissues   Sigmoid diverticulosis noted.   IMPRESSION: Acute or subacute minimally displaced fracture of the left sacrum. There is also a small fracture in the left ilium adjacent to the SI joint.   Acute or subacute mild inferior endplate compression fracture L5.   Diverticulosis.     Electronically Signed   By: Inge Rise M.D.   On: 12/16/2020 09:57   PATIENT SURVEYS:  FOTO 32(44 predicted)   SCREENING FOR RED FLAGS: Bowel or bladder incontinence: No   COGNITION: Overall cognitive status: Within functional limits for tasks assessed                          SENSATION: Not tested   MUSCLE LENGTH: Hamstrings: Right 90 deg; Left 90 deg     POSTURE: decreased lumbar lordosis and side bent R with R knee flexed  in valgus with pronated R ankle   PALPATION: NT   LUMBAR ROM:    AROM eval  Flexion 75%  Extension 25%  Right lateral flexion 25%  Left lateral flexion 25%  Right rotation 90%  Left rotation 75%   (Blank rows = not tested)   LOWER EXTREMITY ROM:   WFL   Active  Right eval Left eval  Hip flexion      Hip extension      Hip abduction      Hip adduction      Hip internal rotation      Hip external rotation      Knee flexion      Knee extension      Ankle dorsiflexion      Ankle plantarflexion      Ankle inversion      Ankle eversion       (Blank rows =  not tested)   LOWER EXTREMITY MMT:  [facility]   MMT Right eval Left eval  Hip flexion      Hip extension      Hip abduction      Hip adduction      Hip internal rotation      Hip external rotation  Knee flexion      Knee extension      Ankle dorsiflexion      Ankle plantarflexion      Ankle inversion      Ankle eversion      Core/trunk 3 3   (Blank rows = not tested)   LUMBAR SPECIAL TESTS:  Deferred due to fusion   FUNCTIONAL TESTS:  NT   GAIT: Distance walked: 75f x2 Assistive device utilized: None Level of assistance: Complete Independence Comments: antalgic    TODAY'S TREATMENT:  [facility]                                                DATE: 12/30/[email or web address] therapeutic pool temp approximately *** degrees. Pt enters building ***. Treatment took place in water 3.8 to  4 ft 8 in.feet deep depending upon activity.  Pt entered and exited the pool via stair and handrails ***.  Patient entered water for aquatic therapy for first time and was introduced to principles and therapeutic effects of water as they ambulated and acclimated to pool.  Therapeutic Exercise: Walking forward/backwards/side stepping Lunge stepping forwards x2 laps Side stepping lunge x2 laps Runners stretch on bottom step x30" BIL Hamstring stretch on bottom step x30" BIL Figure 4 squat stretch, BIL UE support 2x30" BIL Standing thoracic rotation with noodle x1' STS from 3rd step from bottom At edge of pool, pt performed LE exercise: Hip abd/add x20 BIL Hip ext/flex with knee straight x 20 BIL Hip Circles CC/CCW 2x10 each BIL Marching hip flexion to knee extension 2x10 BIL Hamstring curl x20 BIL Squats 2x20 Step ups on submerged step 2x10 BIL Step up and overs on submerged step 2x10 BIL Sitting on bench in water: Bicycle kicks x1' Reverse bicycle kicks x1' Flutter kicks x1' Scissor kicks x1' Kickboard push/pull x1' Kickboard push  downs x1'  Pt requires the buoyancy of water for active assisted exercises with buoyancy supported for strengthening and AROM exercises. Hydrostatic pressure also supports joints by unweighting joint load by at least 50 % in 3-4 feet depth water. 80% in chest to neck deep water. Water will provide assistance with movement using the current and laminar flow while the buoyancy reduces weight bearing. Pt requires the viscosity of the water for resistance with strengthening exercises.                                                                                                                              DATE: 12/07/22 Eval and HEP     PATIENT EDUCATION:  Education details: Discussed eval findings, rehab rationale and POC and patient is in agreement  Person educated: Patient Education method: Explanation Education comprehension: verbalized understanding and needs further education   HOME EXERCISE PROGRAM: Access Code: [email or web address]  URL: [email or web address] Date: 12/07/2022 Prepared by: Sharlynn Oliphant   Exercises - Supine Posterior Pelvic Tilt  - 2 x daily - 5 x weekly - 1 sets - 10 reps - 3s hold - Supine 90/90 Alternating Heel Touches with Posterior Pelvic Tilt  - 2 x daily - 5 x weekly - 1 sets - 2 reps - 30s hold - Supine 90/90 Abdominal Bracing  - 2 x daily - 5 x weekly - 1 sets - 2 reps - 30s hold   ASSESSMENT:   CLINICAL IMPRESSION: ***  Patient is a 77 y.o. female who was seen today for physical therapy evaluation and treatment for chronic low back pain in setting of lumbar fusion and foraminotomy.  She present with limitations in trunk mobility   OBJECTIVE IMPAIRMENTS: Abnormal gait, decreased activity tolerance, decreased balance, decreased mobility, difficulty walking, decreased ROM, decreased strength, impaired perceived functional ability, impaired flexibility, improper body mechanics, postural dysfunction, and pain.    ACTIVITY LIMITATIONS: carrying, lifting,  bending, standing, squatting, and stairs   PERSONAL FACTORS: Age, Fitness, Past/current experiences, Time since onset of injury/illness/exacerbation, and 1 comorbidity: RA  are also affecting patient's functional outcome.    REHAB POTENTIAL: Good   CLINICAL DECISION MAKING: Evolving/moderate complexity   EVALUATION COMPLEXITY: Moderate     GOALS: Goals reviewed with patient? Yes   SHORT TERM GOALS: Target date: 12/28/2022     Begin aquatic program. Baseline: TBD Goal status: INITIAL   2.  Patient to demonstrate independence in HEP  Baseline: [id number] Goal status: INITIAL     LONG TERM GOALS: Target date: 01/18/2023     Increase trunk and core strength to 4/5 Baseline: 3/5 Goal status: INITIAL   2.  Increase FOTO score to 44 Baseline: 32 Goal status: INITIAL   3.  Patient to perform 5x STS with arms crossed in <20s Baseline: TBD Goal status: INITIAL   4.  Decrease worst pain to 6/10 Baseline: 8/10 worst pain Goal status: INITIAL     PLAN:   PT FREQUENCY: 2x/week   PT DURATION: 6 weeks   PLANNED INTERVENTIONS: Therapeutic exercises, Therapeutic activity, Neuromuscular re-education, Balance training, Gait training, Patient/Family education, Self Care, Joint mobilization, Stair training, Aquatic Therapy, Dry Needling, Manual therapy, and Re-evaluation.   PLAN FOR NEXT SESSION: Initiate aquatic therapy, core and trunk strengthening, postural exercises, stretching tasks    Margarette Canada, PTA 12/29/2022, 3:18 PM

## 2022-12-29 NOTE — Therapy (Deleted)
OUTPATIENT PHYSICAL THERAPY TREATMENT NOTE   Patient Name: Tanya Duncan MRN: [id number] DOB:August 26, 1946, 77 y.o., female Today's Date: 12/29/2022  PCP: Bernerd Limbo, MD    REFERRING PROVIDER: Mindi Curling, PA-C     Past Medical History:  Diagnosis Date   Anxiety    Arthritis    Blood transfusion without reported diagnosis 05/2019   with hip surgery   Fibromyalgia    Fractured pelvis ([place]) 09/15/2020   right   GERD (gastroesophageal reflux disease)    GI bleed [id number]   x2   Hyperlipidemia    Pneumonia 2005   Stomach ulcer 1990   Past Surgical History:  Procedure Laterality Date   ABDOMINAL HYSTERECTOMY     FRACTURE SURGERY Right 1998   ankle   JOINT REPLACEMENT     LUMBAR FUSION  2015   OPEN REDUCTION INTERNAL FIXATION (ORIF) PROXIMAL PHALANX Right 09/21/2020   Procedure: OPEN TREATMENT OF RIGHT LONG FINGER PROXIMAL PHALANX FRACTURE;  Surgeon: Milly Jakob, MD;  Location: [place];  Service: Orthopedics;  Laterality: Right;  LENGTH OF SURGERY: 75 MIN   ORIF HUMERUS FRACTURE Left    ORIF TIBIA FRACTURE Right 1998   with bone graft   PYLOROPLASTY     REVERSE SHOULDER ARTHROPLASTY Left 02/04/2021   Procedure: REVERSE SHOULDER ARTHROPLASTY WITH HARDWARE REMOVAL;  Surgeon: Tania Ade, MD;  [facility];  Service: Orthopedics;  Laterality: Left;   TOTAL HIP ARTHROPLASTY  2011   TOTAL HIP ARTHROPLASTY Right 05/14/2019   Procedure: Right Anterior Hip Arthroplasty;  Surgeon: Melrose Nakayama, MD;  Location[facility];  Service: Orthopedics;  Laterality: Right;   TOTAL SHOULDER REPLACEMENT  2014   Patient Active Problem List   Diagnosis Date Noted   Palpitations 09/01/2020   Chest pain 09/01/2020   Abnormal ECG 09/01/2020   Moderate episode of recurrent major depressive disorder ([place]) 06/22/2020   Prediabetes 06/22/2020   Mixed hyperlipidemia 06/22/2020   History of total right hip arthroplasty 05/17/2019   Anxiety    Primary osteoarthritis of right hip  05/14/2019   Vitamin D deficiency 01/10/2019   Insomnia due to anxiety and fear 10/25/2017   Chronic prescription benzodiazepine use 10/25/2017   Chronic bilateral low back pain without sciatica 04/27/2017   Osteoarthritis 01/06/2017   GAD (generalized anxiety disorder) 01/06/2017   Rheumatoid arthritis involving multiple sites with positive rheumatoid factor ([place]) 12/12/2016   Status post lumbar spinal fusion 12/12/2016   Fibromyalgia 12/12/2016    THERAPY DIAG:  No diagnosis found.   Rationale for Evaluation and Treatment Rehabilitation  REFERRING DIAG: M05.79 (ICD-10-CM) - Rheumatoid arthritis with rheumatoid factor of multiple sites without organ or systems involvement M54.50 (ICD-10-CM) - Low back pain, unspecified G89.29 (ICD-10-CM) - Other chronic pain   PERTINENT HISTORY: Lumbar fusion and foraminotomy 10 years ago, B THA, hx of L TSA reverse   PRECAUTIONS/RESTRICTIONS:   lumbar fusion, RA  SUBJECTIVE:  ***  PAIN:  Are you having pain? Yes: NPRS scale: 8/10 Pain location: sacral region  Pain description: ache  Aggravating factors: activity Relieving factors: meds and thermal modalities  OBJECTIVE: (objective measures completed at initial evaluation unless otherwise dated)  DIAGNOSTIC FINDINGS:  CLINICAL DATA:  Sacral pain since the patient suffered a fall when she was pulled down by her dog 09/08/2020.   EXAM: MRI LUMBAR SPINE WITHOUT CONTRAST   TECHNIQUE: Multiplanar, multisequence MR imaging of the lumbar spine was performed. No intravenous contrast was administered.   COMPARISON:  CT of the right hip 10/20/2020. MRI of  the right hip 02/01/2020.   FINDINGS: Bones/Joint/Cartilage   There is marrow edema throughout the left sacrum due to a mildly displaced fracture. A small focus of marrow edema is seen in the left ilium adjacent to the SI joint consistent with nondisplaced fracture. Also seen is a mild inferior endplate compression fracture of L5  eccentric to the right with vertebral body height loss of approximately 10% and associated marrow edema.   There is partial visualization of lumbar fusion hardware extending from L4 above the superior aspect of the scan. Artifact from bilateral hip replacements also noted. No worrisome lesion is identified.   Ligaments   Negative.   Muscles and Tendons   Intact.   Soft tissues   Sigmoid diverticulosis noted.   IMPRESSION: Acute or subacute minimally displaced fracture of the left sacrum. There is also a small fracture in the left ilium adjacent to the SI joint.   Acute or subacute mild inferior endplate compression fracture L5.   Diverticulosis.     Electronically Signed   By: Inge Rise M.D.   On: 12/16/2020 09:57   PATIENT SURVEYS:  FOTO 32(44 predicted)   SCREENING FOR RED FLAGS: Bowel or bladder incontinence: No   COGNITION: Overall cognitive status: Within functional limits for tasks assessed                          SENSATION: Not tested   MUSCLE LENGTH: Hamstrings: Right 90 deg; Left 90 deg     POSTURE: decreased lumbar lordosis and side bent R with R knee flexed  in valgus with pronated R ankle   PALPATION: NT   LUMBAR ROM:    AROM eval  Flexion 75%  Extension 25%  Right lateral flexion 25%  Left lateral flexion 25%  Right rotation 90%  Left rotation 75%   (Blank rows = not tested)   LOWER EXTREMITY ROM:   WFL   Active  Right eval Left eval  Hip flexion      Hip extension      Hip abduction      Hip adduction      Hip internal rotation      Hip external rotation      Knee flexion      Knee extension      Ankle dorsiflexion      Ankle plantarflexion      Ankle inversion      Ankle eversion       (Blank rows = not tested)   LOWER EXTREMITY MMT:  [facility]   MMT Right eval Left eval  Hip flexion      Hip extension      Hip abduction      Hip adduction      Hip internal rotation      Hip external rotation      Knee  flexion      Knee extension      Ankle dorsiflexion      Ankle plantarflexion      Ankle inversion      Ankle eversion      Core/trunk 3 3   (Blank rows = not tested)   LUMBAR SPECIAL TESTS:  Deferred due to fusion   FUNCTIONAL TESTS:  NT   GAIT: Distance walked: 71f x2 Assistive device utilized: None Level of assistance: Complete Independence Comments: antalgic     HOME EXERCISE PROGRAM: Access Code: [email or web address]: [email or web address] Date: 12/07/2022 Prepared by: JSharlynn Oliphant  Exercises - Supine Posterior Pelvic Tilt  - 2 x daily - 5 x weekly - 1 sets - 10 reps - 3s hold - Supine 90/90 Alternating Heel Touches with Posterior Pelvic Tilt  - 2 x daily - 5 x weekly - 1 sets - 2 reps - 30s hold - Supine 90/90 Abdominal Bracing  - 2 x daily - 5 x weekly - 1 sets - 2 reps - 30s hold  TREATMENT 12/15/22:  Aquatic therapy at [address] - therapeutic pool temp 92 degrees Pt enters building independently.  Treatment took place in water 3.8 to  4 ft 8 in.feet deep depending upon activity.  Pt entered and exited the pool via stair and handrails    Aquatic Therapy:  Water walking for warm up fwd/lat/bkwds  Standing: Standing march Hamstring curl x20 BIL Hip abd/add x20 BIL Hip Circles CC/CCW 2x10 each BIL Heel raises - x20 Hip ext/flex with knee straight x 20 BIL Yellow noodle stomp x20 ea Squats x20 Lunge fwd x20 Lunge lateral x20 Step ups on submerged step 2x10 BIL Step up and overs on submerged step 2x10 BIL  Core/shoulders: Kickboard push down Yellow noodle push down Riding noodle across pool using arms for propulsion reverse Water bell push pull bil alternating in squat Water bell push pull alternating in squat Water bell alternating flexion/ext in squat Water bell bil flexion/ext in squat  Gait/LE: Lateral walking with dumbell add/abd Walking march With PF Walking lunge Walking lateral lunge Cross over  walking  Balance: *** stance w/ bil shoulder flexion/ext *** stance w/ alternating shoulder flexion/ext *** stance w/ bil shoulder flexion/ext w/ waterbell resistance *** stance w/ alternating shoulder flexion/ext w/ waterbell resistance  Sitting on bench in water: Bicycle kicks x1' Reverse bicycle kicks x1' Flutter kicks x1' Scissor kicks x1'  Stretching: Runners stretch on bottom step x30" BIL Hamstring stretch on bottom step x30" BIL Figure 4 squat stretch, BIL UE support 2x30" BIL  Pt requires the buoyancy of water for active assisted exercises with buoyancy supported for strengthening and AROM exercises. Hydrostatic pressure also supports joints by unweighting joint load by at least 50 % in 3-4 feet depth water. 80% in chest to neck deep water. Water will provide assistance with movement using the current and laminar flow while the buoyancy reduces weight bearing. Pt requires the viscosity of the water for resistance with strengthening exercises.   ASSESSMENT:   CLINICAL IMPRESSION: Session today focused on *** in the aquatic environment for use of buoyancy to offload joints and the viscosity of water as resistance during therapeutic exercise.  ***.  Patient was able to tolerate all prescribed exercises in the aquatic environment with no adverse effects and reports ***/10 pain at the end of the session. Patient continues to benefit from skilled PT services on land and aquatic based and should be progressed as able to improve functional independence.     OBJECTIVE IMPAIRMENTS: Abnormal gait, decreased activity tolerance, decreased balance, decreased mobility, difficulty walking, decreased ROM, decreased strength, impaired perceived functional ability, impaired flexibility, improper body mechanics, postural dysfunction, and pain.    ACTIVITY LIMITATIONS: carrying, lifting, bending, standing, squatting, and stairs   PERSONAL FACTORS: Age, Fitness, Past/current experiences, Time  since onset of injury/illness/exacerbation, and 1 comorbidity: RA  are also affecting patient's functional outcome.    REHAB POTENTIAL: Good   CLINICAL DECISION MAKING: Evolving/moderate complexity   EVALUATION COMPLEXITY: Moderate     GOALS: Goals reviewed with patient? Yes   SHORT TERM GOALS:  Target date: 12/28/2022     Begin aquatic program. Baseline: TBD Goal status: INITIAL   2.  Patient to demonstrate independence in HEP  Baseline: [id number] Goal status: INITIAL     LONG TERM GOALS: Target date: 01/18/2023     Increase trunk and core strength to 4/5 Baseline: 3/5 Goal status: INITIAL   2.  Increase FOTO score to 44 Baseline: 32 Goal status: INITIAL   3.  Patient to perform 5x STS with arms crossed in <20s Baseline: TBD Goal status: INITIAL   4.  Decrease worst pain to 6/10 Baseline: 8/10 worst pain Goal status: INITIAL     PLAN:   PT FREQUENCY: 2x/week   PT DURATION: 6 weeks   PLANNED INTERVENTIONS: Therapeutic exercises, Therapeutic activity, Neuromuscular re-education, Balance training, Gait training, Patient/Family education, Self Care, Joint mobilization, Stair training, Aquatic Therapy, Dry Needling, Manual therapy, and Re-evaluation.   PLAN FOR NEXT SESSION: Initiate aquatic therapy, core and trunk strengthening, postural exercises, stretching tasks    Kevan Ny Rayli Wiederhold PT 12/29/2022, 8:10 AM

## 2022-12-30 ENCOUNTER — Ambulatory Visit: Payer: Medicare Other

## 2023-01-03 ENCOUNTER — Ambulatory Visit: Payer: Medicare Other | Admitting: Gastroenterology

## 2023-01-05 ENCOUNTER — Encounter: Payer: Self-pay | Admitting: Physical Therapy

## 2023-01-05 ENCOUNTER — Ambulatory Visit: Payer: Medicare Other | Attending: Family Medicine | Admitting: Physical Therapy

## 2023-01-05 DIAGNOSIS — M4326 Fusion of spine, lumbar region: Secondary | ICD-10-CM | POA: Diagnosis present

## 2023-01-05 DIAGNOSIS — M5459 Other low back pain: Secondary | ICD-10-CM | POA: Diagnosis present

## 2023-01-05 DIAGNOSIS — M069 Rheumatoid arthritis, unspecified: Secondary | ICD-10-CM | POA: Insufficient documentation

## 2023-01-05 NOTE — Therapy (Signed)
OUTPATIENT PHYSICAL THERAPY TREATMENT NOTE   Patient Name: Tanya Duncan MRN: [id number] DOB:11/28/1945, 77 y.o., female Today's Date: 01/06/2023  PCP: Bernerd Limbo, MD    REFERRING PROVIDER: Juanda Chance    PT End of Session - 01/05/23 1514     Visit Number 2    Number of Visits 12    Date for PT Re-Evaluation 02/01/23    Authorization Type MCR    PT Start Time 0315    PT Stop Time 0356    PT Time Calculation (min) 41 min    Activity Tolerance Patient tolerated treatment well    Behavior During Therapy [facility] for tasks assessed/performed             Past Medical History:  Diagnosis Date   Anxiety    Arthritis    Blood transfusion without reported diagnosis 05/2019   with hip surgery   Fibromyalgia    Fractured pelvis ([place]) 09/15/2020   right   GERD (gastroesophageal reflux disease)    GI bleed [id number]   x2   Hyperlipidemia    Pneumonia 2005   Stomach ulcer 1990   Past Surgical History:  Procedure Laterality Date   ABDOMINAL HYSTERECTOMY     FRACTURE SURGERY Right 1998   ankle   JOINT REPLACEMENT     LUMBAR FUSION  2015   OPEN REDUCTION INTERNAL FIXATION (ORIF) PROXIMAL PHALANX Right 09/21/2020   Procedure: OPEN TREATMENT OF RIGHT LONG FINGER PROXIMAL PHALANX FRACTURE;  Surgeon: Milly Jakob, MD;  Location: [place];  Service: Orthopedics;  Laterality: Right;  LENGTH OF SURGERY: 75 MIN   ORIF HUMERUS FRACTURE Left    ORIF TIBIA FRACTURE Right 1998   with bone graft   PYLOROPLASTY     REVERSE SHOULDER ARTHROPLASTY Left 02/04/2021   Procedure: REVERSE SHOULDER ARTHROPLASTY WITH HARDWARE REMOVAL;  Surgeon: Tania Ade, MD;  [facility];  Service: Orthopedics;  Laterality: Left;   TOTAL HIP ARTHROPLASTY  2011   TOTAL HIP ARTHROPLASTY Right 05/14/2019   Procedure: Right Anterior Hip Arthroplasty;  Surgeon: Melrose Nakayama, MD;  [facility];  Service: Orthopedics;  Laterality: Right;   TOTAL SHOULDER REPLACEMENT  2014   Patient Active  Problem List   Diagnosis Date Noted   Palpitations 09/01/2020   Chest pain 09/01/2020   Abnormal ECG 09/01/2020   Moderate episode of recurrent major depressive disorder ([place]) 06/22/2020   Prediabetes 06/22/2020   Mixed hyperlipidemia 06/22/2020   History of total right hip arthroplasty 05/17/2019   Anxiety    Primary osteoarthritis of right hip 05/14/2019   Vitamin D deficiency 01/10/2019   Insomnia due to anxiety and fear 10/25/2017   Chronic prescription benzodiazepine use 10/25/2017   Chronic bilateral low back pain without sciatica 04/27/2017   Osteoarthritis 01/06/2017   GAD (generalized anxiety disorder) 01/06/2017   Rheumatoid arthritis involving multiple sites with positive rheumatoid factor ([place]) 12/12/2016   Status post lumbar spinal fusion 12/12/2016   Fibromyalgia 12/12/2016    THERAPY DIAG:  Other low back pain  Fusion of lumbar spine  Rheumatoid arthritis involving multiple sites, unspecified whether rheumatoid factor present ([place])   Rationale for Evaluation and Treatment Rehabilitation  REFERRING DIAG: M05.79 (ICD-10-CM) - Rheumatoid arthritis with rheumatoid factor of multiple sites without organ or systems involvement M54.50 (ICD-10-CM) - Low back pain, unspecified G89.29 (ICD-10-CM) - Other chronic pain   PERTINENT HISTORY: Lumbar fusion and foraminotomy 10 years ago, B THA, hx of L TSA reverse   PRECAUTIONS/RESTRICTIONS:   lumbar fusion, RA  SUBJECTIVE:  Pt reports she had an ear infection and had to miss 2 visits.  She reports her back is feeling good today.  PAIN:  Are you having pain? Yes: NPRS scale: 5/10 Pain location: sacral region  Pain description: ache  Aggravating factors: activity Relieving factors: meds and thermal modalities  OBJECTIVE: (objective measures completed at initial evaluation unless otherwise dated)  DIAGNOSTIC FINDINGS:  CLINICAL DATA:  Sacral pain since the patient suffered a fall when she was pulled down by her dog  09/08/2020.   EXAM: MRI LUMBAR SPINE WITHOUT CONTRAST   TECHNIQUE: Multiplanar, multisequence MR imaging of the lumbar spine was performed. No intravenous contrast was administered.   COMPARISON:  CT of the right hip 10/20/2020. MRI of the right hip 02/01/2020.   FINDINGS: Bones/Joint/Cartilage   There is marrow edema throughout the left sacrum due to a mildly displaced fracture. A small focus of marrow edema is seen in the left ilium adjacent to the SI joint consistent with nondisplaced fracture. Also seen is a mild inferior endplate compression fracture of L5 eccentric to the right with vertebral body height loss of approximately 10% and associated marrow edema.   There is partial visualization of lumbar fusion hardware extending from L4 above the superior aspect of the scan. Artifact from bilateral hip replacements also noted. No worrisome lesion is identified.   Ligaments   Negative.   Muscles and Tendons   Intact.   Soft tissues   Sigmoid diverticulosis noted.   IMPRESSION: Acute or subacute minimally displaced fracture of the left sacrum. There is also a small fracture in the left ilium adjacent to the SI joint.   Acute or subacute mild inferior endplate compression fracture L5.   Diverticulosis.     Electronically Signed   By: Inge Rise M.D.   On: 12/16/2020 09:57   PATIENT SURVEYS:  FOTO 32(44 predicted)   SCREENING FOR RED FLAGS: Bowel or bladder incontinence: No   COGNITION: Overall cognitive status: Within functional limits for tasks assessed                          SENSATION: Not tested   MUSCLE LENGTH: Hamstrings: Right 90 deg; Left 90 deg     POSTURE: decreased lumbar lordosis and side bent R with R knee flexed  in valgus with pronated R ankle   PALPATION: NT   LUMBAR ROM:    AROM eval  Flexion 75%  Extension 25%  Right lateral flexion 25%  Left lateral flexion 25%  Right rotation 90%  Left rotation 75%   (Blank  rows = not tested)   LOWER EXTREMITY ROM:   WFL   Active  Right eval Left eval  Hip flexion      Hip extension      Hip abduction      Hip adduction      Hip internal rotation      Hip external rotation      Knee flexion      Knee extension      Ankle dorsiflexion      Ankle plantarflexion      Ankle inversion      Ankle eversion       (Blank rows = not tested)   LOWER EXTREMITY MMT:  [facility]   MMT Right eval Left eval  Hip flexion      Hip extension      Hip abduction      Hip adduction  Hip internal rotation      Hip external rotation      Knee flexion      Knee extension      Ankle dorsiflexion      Ankle plantarflexion      Ankle inversion      Ankle eversion      Core/trunk 3 3   (Blank rows = not tested)   LUMBAR SPECIAL TESTS:  Deferred due to fusion   FUNCTIONAL TESTS:  NT   GAIT: Distance walked: 55f x2 Assistive device utilized: None Level of assistance: Complete Independence Comments: antalgic     HOME EXERCISE PROGRAM: Access Code: [email or web address]: [email or web address] Date: 12/07/2022 Prepared by: JSharlynn Oliphant  Exercises - Supine Posterior Pelvic Tilt  - 2 x daily - 5 x weekly - 1 sets - 10 reps - 3s hold - Supine 90/90 Alternating Heel Touches with Posterior Pelvic Tilt  - 2 x daily - 5 x weekly - 1 sets - 2 reps - 30s hold - Supine 90/90 Abdominal Bracing  - 2 x daily - 5 x weekly - 1 sets - 2 reps - 30s hold  TREATMENT 01/05/23:  Aquatic therapy at [address] - therapeutic pool temp 92 degrees Pt enters building independently.  Treatment took place in water 3.8 to  4 ft 8 in.feet deep depending upon activity.  Pt entered and exited the pool via stair and handrails    Aquatic Therapy:  Water walking for warm up fwd/lat/bkwds  Standing: Walking march Standing march Hip abd/add x20 BIL Heel raises - x20 Green noodle stomp x20 ea Step ups on submerged step 2x10 BIL Kickboard push down -  10x Kickboard - push/pull - 20x   NOT TODAY Squats x20 Lunge fwd x20 Lunge lateral x20 Step up and overs on submerged step 2x10 BIL   Pt requires the buoyancy of water for active assisted exercises with buoyancy supported for strengthening and AROM exercises. Hydrostatic pressure also supports joints by unweighting joint load by at least 50 % in 3-4 feet depth water. 80% in chest to neck deep water. Water will provide assistance with movement using the current and laminar flow while the buoyancy reduces weight bearing. Pt requires the viscosity of the water for resistance with strengthening exercises.   ASSESSMENT:   CLINICAL IMPRESSION: Session today focused on LE and core strengthening in the aquatic environment for use of buoyancy to offload joints and the viscosity of water as resistance during therapeutic exercise.  Pt with several instances of LOB during LS activities which required minA assist from therapist.  Patient was able to tolerate all prescribed exercises in the aquatic environment with no adverse effects and reports 4/10 pain at the end of the session. Patient continues to benefit from skilled PT services on land and aquatic based and should be progressed as able to improve functional independence.     OBJECTIVE IMPAIRMENTS: Abnormal gait, decreased activity tolerance, decreased balance, decreased mobility, difficulty walking, decreased ROM, decreased strength, impaired perceived functional ability, impaired flexibility, improper body mechanics, postural dysfunction, and pain.    ACTIVITY LIMITATIONS: carrying, lifting, bending, standing, squatting, and stairs   PERSONAL FACTORS: Age, Fitness, Past/current experiences, Time since onset of injury/illness/exacerbation, and 1 comorbidity: RA  are also affecting patient's functional outcome.    REHAB POTENTIAL: Good   CLINICAL DECISION MAKING: Evolving/moderate complexity   EVALUATION COMPLEXITY: Moderate     GOALS: Goals  reviewed with patient? Yes   SHORT TERM GOALS: Target date:  12/28/2022     Begin aquatic program. Baseline: TBD Goal status: INITIAL   2.  Patient to demonstrate independence in HEP  Baseline: [id number] Goal status: INITIAL     LONG TERM GOALS: Target date: 01/18/2023     Increase trunk and core strength to 4/5 Baseline: 3/5 Goal status: INITIAL   2.  Increase FOTO score to 44 Baseline: 32 Goal status: INITIAL   3.  Patient to perform 5x STS with arms crossed in <20s Baseline: TBD Goal status: INITIAL   4.  Decrease worst pain to 6/10 Baseline: 8/10 worst pain Goal status: INITIAL     PLAN:   PT FREQUENCY: 2x/week   PT DURATION: 6 weeks   PLANNED INTERVENTIONS: Therapeutic exercises, Therapeutic activity, Neuromuscular re-education, Balance training, Gait training, Patient/Family education, Self Care, Joint mobilization, Stair training, Aquatic Therapy, Dry Needling, Manual therapy, and Re-evaluation.   PLAN FOR NEXT SESSION: Initiate aquatic therapy, core and trunk strengthening, postural exercises, stretching tasks    Kevan Ny Khadeeja Elden PT 01/06/2023, 10:08 AM

## 2023-01-06 ENCOUNTER — Ambulatory Visit: Payer: Medicare Other | Attending: Family Medicine

## 2023-01-06 ENCOUNTER — Other Ambulatory Visit: Payer: Self-pay | Admitting: Gastroenterology

## 2023-01-06 DIAGNOSIS — M5459 Other low back pain: Secondary | ICD-10-CM | POA: Diagnosis not present

## 2023-01-06 DIAGNOSIS — M4326 Fusion of spine, lumbar region: Secondary | ICD-10-CM

## 2023-01-06 DIAGNOSIS — M069 Rheumatoid arthritis, unspecified: Secondary | ICD-10-CM

## 2023-01-06 NOTE — Therapy (Signed)
OUTPATIENT PHYSICAL THERAPY TREATMENT NOTE   Patient Name: Tanya Duncan MRN: [id number] DOB:June 19, 1946, 77 y.o., female Today's Date: 01/06/2023  PCP: Bernerd Limbo, MD    REFERRING PROVIDER: Juanda Chance    PT End of Session - 01/06/23 1512     Visit Number 3    Number of Visits 12    Date for PT Re-Evaluation 02/01/23    Authorization Type MCR    PT Start Time 0310    PT Stop Time [id number]    PT Time Calculation (min) 45 min    Activity Tolerance Patient tolerated treatment well    Behavior During Therapy [facility] for tasks assessed/performed              Past Medical History:  Diagnosis Date   Anxiety    Arthritis    Blood transfusion without reported diagnosis 05/2019   with hip surgery   Fibromyalgia    Fractured pelvis ([place]) 09/15/2020   right   GERD (gastroesophageal reflux disease)    GI bleed [id number]   x2   Hyperlipidemia    Pneumonia 2005   Stomach ulcer 1990   Past Surgical History:  Procedure Laterality Date   ABDOMINAL HYSTERECTOMY     FRACTURE SURGERY Right 1998   ankle   JOINT REPLACEMENT     LUMBAR FUSION  2015   OPEN REDUCTION INTERNAL FIXATION (ORIF) PROXIMAL PHALANX Right 09/21/2020   Procedure: OPEN TREATMENT OF RIGHT LONG FINGER PROXIMAL PHALANX FRACTURE;  Surgeon: Milly Jakob, MD;  Location: [facility];  Service: Orthopedics;  Laterality: Right;  LENGTH OF SURGERY: 75 MIN   ORIF HUMERUS FRACTURE Left    ORIF TIBIA FRACTURE Right 1998   with bone graft   PYLOROPLASTY     REVERSE SHOULDER ARTHROPLASTY Left 02/04/2021   Procedure: REVERSE SHOULDER ARTHROPLASTY WITH HARDWARE REMOVAL;  Surgeon: Tania Ade, MD;  Location: [facility];  Service: Orthopedics;  Laterality: Left;   TOTAL HIP ARTHROPLASTY  2011   TOTAL HIP ARTHROPLASTY Right 05/14/2019   Procedure: Right Anterior Hip Arthroplasty;  Surgeon: [place] Nakayama, MD;  Location: [facility];  Service: Orthopedics;  Laterality: Right;   TOTAL SHOULDER REPLACEMENT  2014   Patient Active  Problem List   Diagnosis Date Noted   Palpitations 09/01/2020   Chest pain 09/01/2020   Abnormal ECG 09/01/2020   Moderate episode of recurrent major depressive disorder ([place]) 06/22/2020   Prediabetes 06/22/2020   Mixed hyperlipidemia 06/22/2020   History of total right hip arthroplasty 05/17/2019   Anxiety    Primary osteoarthritis of right hip 05/14/2019   Vitamin D deficiency 01/10/2019   Insomnia due to anxiety and fear 10/25/2017   Chronic prescription benzodiazepine use 10/25/2017   Chronic bilateral low back pain without sciatica 04/27/2017   Osteoarthritis 01/06/2017   GAD (generalized anxiety disorder) 01/06/2017   Rheumatoid arthritis involving multiple sites with positive rheumatoid factor ([place]) 12/12/2016   Status post lumbar spinal fusion 12/12/2016   Fibromyalgia 12/12/2016    THERAPY DIAG:  Other low back pain  Fusion of lumbar spine  Rheumatoid arthritis involving multiple sites, unspecified whether rheumatoid factor present ([place])   Rationale for Evaluation and Treatment Rehabilitation  REFERRING DIAG: M05.79 (ICD-10-CM) - Rheumatoid arthritis with rheumatoid factor of multiple sites without organ or systems involvement M54.50 (ICD-10-CM) - Low back pain, unspecified G89.29 (ICD-10-CM) - Other chronic pain   PERTINENT HISTORY: Lumbar fusion and foraminotomy 10 years ago, B THA, hx of L TSA reverse   PRECAUTIONS/RESTRICTIONS:   lumbar fusion,  RA  SUBJECTIVE:  Patient reports muscle soreness from session yesterday.  PAIN:  Are you having pain? Yes: NPRS scale: 2/10 Pain location: sacral region  Pain description: ache  Aggravating factors: activity Relieving factors: meds and thermal modalities  OBJECTIVE: (objective measures completed at initial evaluation unless otherwise dated)  DIAGNOSTIC FINDINGS:  CLINICAL DATA:  Sacral pain since the patient suffered a fall when she was pulled down by her dog 09/08/2020.   EXAM: MRI LUMBAR SPINE WITHOUT  CONTRAST   TECHNIQUE: Multiplanar, multisequence MR imaging of the lumbar spine was performed. No intravenous contrast was administered.   COMPARISON:  CT of the right hip 10/20/2020. MRI of the right hip 02/01/2020.   FINDINGS: Bones/Joint/Cartilage   There is marrow edema throughout the left sacrum due to a mildly displaced fracture. A small focus of marrow edema is seen in the left ilium adjacent to the SI joint consistent with nondisplaced fracture. Also seen is a mild inferior endplate compression fracture of L5 eccentric to the right with vertebral body height loss of approximately 10% and associated marrow edema.   There is partial visualization of lumbar fusion hardware extending from L4 above the superior aspect of the scan. Artifact from bilateral hip replacements also noted. No worrisome lesion is identified.   Ligaments   Negative.   Muscles and Tendons   Intact.   Soft tissues   Sigmoid diverticulosis noted.   IMPRESSION: Acute or subacute minimally displaced fracture of the left sacrum. There is also a small fracture in the left ilium adjacent to the SI joint.   Acute or subacute mild inferior endplate compression fracture L5.   Diverticulosis.     Electronically Signed   By: Inge Rise M.D.   On: 12/16/2020 09:57   PATIENT SURVEYS:  FOTO 32(44 predicted)   SCREENING FOR RED FLAGS: Bowel or bladder incontinence: No   COGNITION: Overall cognitive status: Within functional limits for tasks assessed                          SENSATION: Not tested   MUSCLE LENGTH: Hamstrings: Right 90 deg; Left 90 deg     POSTURE: decreased lumbar lordosis and side bent R with R knee flexed  in valgus with pronated R ankle   PALPATION: NT   LUMBAR ROM:    AROM eval  Flexion 75%  Extension 25%  Right lateral flexion 25%  Left lateral flexion 25%  Right rotation 90%  Left rotation 75%   (Blank rows = not tested)   LOWER EXTREMITY ROM:    WFL   Active  Right eval Left eval  Hip flexion      Hip extension      Hip abduction      Hip adduction      Hip internal rotation      Hip external rotation      Knee flexion      Knee extension      Ankle dorsiflexion      Ankle plantarflexion      Ankle inversion      Ankle eversion       (Blank rows = not tested)   LOWER EXTREMITY MMT:  [facility]   MMT Right eval Left eval  Hip flexion      Hip extension      Hip abduction      Hip adduction      Hip internal rotation      Hip  external rotation      Knee flexion      Knee extension      Ankle dorsiflexion      Ankle plantarflexion      Ankle inversion      Ankle eversion      Core/trunk 3 3   (Blank rows = not tested)   LUMBAR SPECIAL TESTS:  Deferred due to fusion   FUNCTIONAL TESTS:  NT   GAIT: Distance walked: 41f x2 Assistive device utilized: None Level of assistance: Complete Independence Comments: antalgic     HOME EXERCISE PROGRAM: Access Code: [email or web address]: [email or web address] Date: 12/07/2022 Prepared by: JSharlynn Oliphant  Exercises - Supine Posterior Pelvic Tilt  - 2 x daily - 5 x weekly - 1 sets - 10 reps - 3s hold - Supine 90/90 Alternating Heel Touches with Posterior Pelvic Tilt  - 2 x daily - 5 x weekly - 1 sets - 2 reps - 30s hold - Supine 90/90 Abdominal Bracing  - 2 x daily - 5 x weekly - 1 sets - 2 reps - 30s hold  TREATMENT 01/06/23: Aquatic therapy at [address] - therapeutic pool temp 92 degrees Pt enters building independently.  Treatment took place in water 3.8 to  4 ft 8 in.feet deep depending upon activity.  Pt entered and exited the pool via stair and handrails    Aquatic Therapy:  Water walking for warm up fwd/lat/bkwds  Standing: Walking march with rainbow DB alternating push/pull Leg extended hip flex/ext x20 BIL (LOB) Hip abd/add x20 BIL Squats x20 Heel raises - x20 Green noodle stomp x20 ea Step ups on submerged step x10 BIL  fwd/lat Sitting on submerged bench: LAQ x1' Bicycle kick x1' Scissor kick x1'  NOT TODAY Lunge fwd x20 Lunge lateral x20 Step up and overs on submerged step 2x10 BIL Kickboard push down - 10x Kickboard - push/pull - 20x  Standing march   Pt requires the buoyancy of water for active assisted exercises with buoyancy supported for strengthening and AROM exercises. Hydrostatic pressure also supports joints by unweighting joint load by at least 50 % in 3-4 feet depth water. 80% in chest to neck deep water. Water will provide assistance with movement using the current and laminar flow while the buoyancy reduces weight bearing. Pt requires the viscosity of the water for resistance with strengthening exercises.  TREATMENT 01/05/23:  [address]atic therapy [address] - therapeutic pool temp 92 degrees Pt enters building independently.  Treatment took place in water 3.8 to  4 ft 8 in.feet deep depending upon activity.  Pt entered and exited the pool via stair and handrails    Aquatic Therapy:  Water walking for warm up fwd/lat/bkwds  Standing: Walking march Standing march Hip abd/add x20 BIL Heel raises - x20 Green noodle stomp x20 ea Step ups on submerged step 2x10 BIL Kickboard push down - 10x Kickboard - push/pull - 20x   NOT TODAY Squats x20 Lunge fwd x20 Lunge lateral x20 Step up and overs on submerged step 2x10 BIL   Pt requires the buoyancy of water for active assisted exercises with buoyancy supported for strengthening and AROM exercises. Hydrostatic pressure also supports joints by unweighting joint load by at least 50 % in 3-4 feet depth water. 80% in chest to neck deep water. Water will provide assistance with movement using the current and laminar flow while the buoyancy reduces weight bearing. Pt requires the viscosity of the water for resistance with strengthening  exercises.   ASSESSMENT:   CLINICAL IMPRESSION: Patient presents to PT reporting  continued lower back pain and some muscle soreness from yesterday's session. Session today focused on proximal hip and core strengthening as well as general conditioning in the aquatic environment for use of buoyancy to offload joints and the viscosity of water as resistance during therapeutic exercise. Patient was able to tolerate all prescribed exercises in the aquatic environment with no adverse effects. Reviewed HEP with patient today. Patient continues to benefit from skilled PT services on land and aquatic based and should be progressed as able to improve functional independence.     OBJECTIVE IMPAIRMENTS: Abnormal gait, decreased activity tolerance, decreased balance, decreased mobility, difficulty walking, decreased ROM, decreased strength, impaired perceived functional ability, impaired flexibility, improper body mechanics, postural dysfunction, and pain.    ACTIVITY LIMITATIONS: carrying, lifting, bending, standing, squatting, and stairs   PERSONAL FACTORS: Age, Fitness, Past/current experiences, Time since onset of injury/illness/exacerbation, and 1 comorbidity: RA  are also affecting patient's functional outcome.    REHAB POTENTIAL: Good   CLINICAL DECISION MAKING: Evolving/moderate complexity   EVALUATION COMPLEXITY: Moderate     GOALS: Goals reviewed with patient? Yes   SHORT TERM GOALS: Target date: 12/28/2022     Begin aquatic program. Baseline: TBD Goal status: MET Initiated 01/05/23   2.  Patient to demonstrate independence in HEP  Baseline: [id number] Goal status: Ongoing Reviewed with patient on 01/06/23     LONG TERM GOALS: Target date: 01/18/2023     Increase trunk and core strength to 4/5 Baseline: 3/5 Goal status: INITIAL   2.  Increase FOTO score to 44 Baseline: 32 Goal status: INITIAL   3.  Patient to perform 5x STS with arms crossed in <20s Baseline: TBD Goal status: INITIAL   4.  Decrease worst pain to 6/10 Baseline: 8/10 worst pain Goal status:  INITIAL     PLAN:   PT FREQUENCY: 2x/week   PT DURATION: 6 weeks   PLANNED INTERVENTIONS: Therapeutic exercises, Therapeutic activity, Neuromuscular re-education, Balance training, Gait training, Patient/Family education, Self Care, Joint mobilization, Stair training, Aquatic Therapy, Dry Needling, Manual therapy, and Re-evaluation.   PLAN FOR NEXT SESSION: Initiate aquatic therapy, core and trunk strengthening, postural exercises, stretching tasks    Margarette Canada PTA 01/06/2023, 3:12 PM

## 2023-01-11 ENCOUNTER — Encounter: Payer: Self-pay | Admitting: Physical Therapy

## 2023-01-11 ENCOUNTER — Ambulatory Visit: Payer: Medicare Other | Admitting: Physical Therapy

## 2023-01-11 DIAGNOSIS — M5459 Other low back pain: Secondary | ICD-10-CM | POA: Diagnosis not present

## 2023-01-11 DIAGNOSIS — M069 Rheumatoid arthritis, unspecified: Secondary | ICD-10-CM

## 2023-01-11 DIAGNOSIS — M4326 Fusion of spine, lumbar region: Secondary | ICD-10-CM

## 2023-01-11 NOTE — Therapy (Signed)
OUTPATIENT PHYSICAL THERAPY TREATMENT NOTE   Patient Name: Tanya Duncan MRN: [id number] DOB:[birth year], 77 y.o., female Today's Date: 01/11/2023  PCP: Bernerd Limbo, MD    REFERRING PROVIDER: Juanda Chance    PT End of Session - 01/11/23 1424     Visit Number 4    Number of Visits 12    Date for PT Re-Evaluation 02/01/23    Authorization Type MCR    PT Start Time [id number]    PT Stop Time 1505    PT Time Calculation (min) 45 min    Activity Tolerance Patient tolerated treatment well    Behavior During Therapy [facility] for tasks assessed/performed              Past Medical History:  Diagnosis Date   Anxiety    Arthritis    Blood transfusion without reported diagnosis 05/2019   with hip surgery   Fibromyalgia    Fractured pelvis ([place]) 09/15/2020   right   GERD (gastroesophageal reflux disease)    GI bleed [id number]   x2   Hyperlipidemia    Pneumonia 2005   Stomach ulcer 1990   Past Surgical History:  Procedure Laterality Date   ABDOMINAL HYSTERECTOMY     FRACTURE SURGERY Right 1998   ankle   JOINT REPLACEMENT     LUMBAR FUSION  2015   OPEN REDUCTION INTERNAL FIXATION (ORIF) PROXIMAL PHALANX Right 09/21/2020   Procedure: OPEN TREATMENT OF RIGHT LONG FINGER PROXIMAL PHALANX FRACTURE;  Surgeon: Milly Jakob, MD;  Location: [place];  Service: Orthopedics;  Laterality: Right;  LENGTH OF SURGERY: 75 MIN   ORIF HUMERUS FRACTURE Left    ORIF TIBIA FRACTURE Right 1998   with bone graft   PYLOROPLASTY     REVERSE SHOULDER ARTHROPLASTY Left 02/04/2021   Procedure: REVERSE SHOULDER ARTHROPLASTY WITH HARDWARE REMOVAL;  Surgeon: Tania Ade, MD;  Location: [facility];  Service: Orthopedics;  Laterality: Left;   TOTAL HIP ARTHROPLASTY  2011   TOTAL HIP ARTHROPLASTY Right 05/14/2019   Procedure: Right Anterior Hip Arthroplasty;  Surgeon: Melrose Nakayama, MD;  Location: [facility];  Service: Orthopedics;  Laterality: Right;   TOTAL SHOULDER REPLACEMENT  2014   Patient Active  Problem List   Diagnosis Date Noted   Palpitations 09/01/2020   Chest pain 09/01/2020   Abnormal ECG 09/01/2020   Moderate episode of recurrent major depressive disorder ([place]) 06/22/2020   Prediabetes 06/22/2020   Mixed hyperlipidemia 06/22/2020   History of total right hip arthroplasty 05/17/2019   Anxiety    Primary osteoarthritis of right hip 05/14/2019   Vitamin D deficiency 01/10/2019   Insomnia due to anxiety and fear 10/25/2017   Chronic prescription benzodiazepine use 10/25/2017   Chronic bilateral low back pain without sciatica 04/27/2017   Osteoarthritis 01/06/2017   GAD (generalized anxiety disorder) 01/06/2017   Rheumatoid arthritis involving multiple sites with positive rheumatoid factor ([place]) 12/12/2016   Status post lumbar spinal fusion 12/12/2016   Fibromyalgia 12/12/2016    THERAPY DIAG:  Other low back pain  Fusion of lumbar spine  Rheumatoid arthritis involving multiple sites, unspecified whether rheumatoid factor present ([place])   Rationale for Evaluation and Treatment Rehabilitation  REFERRING DIAG: M05.79 (ICD-10-CM) - Rheumatoid arthritis with rheumatoid factor of multiple sites without organ or systems involvement M54.50 (ICD-10-CM) - Low back pain, unspecified G89.29 (ICD-10-CM) - Other chronic pain   PERTINENT HISTORY: Lumbar fusion and foraminotomy 10 years ago, B THA, hx of L TSA reverse   PRECAUTIONS/RESTRICTIONS:   lumbar fusion,  RA  SUBJECTIVE:  Patient reports muscle soreness from session yesterday.  PAIN:  Are you having pain? Yes: NPRS scale: 2/10 Pain location: sacral region  Pain description: ache  Aggravating factors: activity Relieving factors: meds and thermal modalities  OBJECTIVE: (objective measures completed at initial evaluation unless otherwise dated)  DIAGNOSTIC FINDINGS:  CLINICAL DATA:  Sacral pain since the patient suffered a fall when she was pulled down by her dog 09/08/2020.   EXAM: MRI LUMBAR SPINE WITHOUT  CONTRAST   TECHNIQUE: Multiplanar, multisequence MR imaging of the lumbar spine was performed. No intravenous contrast was administered.   COMPARISON:  CT of the right hip 10/20/2020. MRI of the right hip 02/01/2020.   FINDINGS: Bones/Joint/Cartilage   There is marrow edema throughout the left sacrum due to a mildly displaced fracture. A small focus of marrow edema is seen in the left ilium adjacent to the SI joint consistent with nondisplaced fracture. Also seen is a mild inferior endplate compression fracture of L5 eccentric to the right with vertebral body height loss of approximately 10% and associated marrow edema.   There is partial visualization of lumbar fusion hardware extending from L4 above the superior aspect of the scan. Artifact from bilateral hip replacements also noted. No worrisome lesion is identified.   Ligaments   Negative.   Muscles and Tendons   Intact.   Soft tissues   Sigmoid diverticulosis noted.   IMPRESSION: Acute or subacute minimally displaced fracture of the left sacrum. There is also a small fracture in the left ilium adjacent to the SI joint.   Acute or subacute mild inferior endplate compression fracture L5.   Diverticulosis.     Electronically Signed   By: Inge Rise M.D.   On: 12/16/2020 09:57   PATIENT SURVEYS:  FOTO 32(44 predicted)   SCREENING FOR RED FLAGS: Bowel or bladder incontinence: No   COGNITION: Overall cognitive status: Within functional limits for tasks assessed                          SENSATION: Not tested   MUSCLE LENGTH: Hamstrings: Right 90 deg; Left 90 deg     POSTURE: decreased lumbar lordosis and side bent R with R knee flexed  in valgus with pronated R ankle   PALPATION: NT   LUMBAR ROM:    AROM eval  Flexion 75%  Extension 25%  Right lateral flexion 25%  Left lateral flexion 25%  Right rotation 90%  Left rotation 75%   (Blank rows = not tested)   LOWER EXTREMITY ROM:    WFL   Active  Right eval Left eval  Hip flexion      Hip extension      Hip abduction      Hip adduction      Hip internal rotation      Hip external rotation      Knee flexion      Knee extension      Ankle dorsiflexion      Ankle plantarflexion      Ankle inversion      Ankle eversion       (Blank rows = not tested)   LOWER EXTREMITY MMT:  [facility]   MMT Right eval Left eval  Hip flexion      Hip extension      Hip abduction      Hip adduction      Hip internal rotation      Hip  external rotation      Knee flexion      Knee extension      Ankle dorsiflexion      Ankle plantarflexion      Ankle inversion      Ankle eversion      Core/trunk 3 3   (Blank rows = not tested)   LUMBAR SPECIAL TESTS:  Deferred due to fusion   FUNCTIONAL TESTS:  NT   GAIT: Distance walked: 42f x2 Assistive device utilized: None Level of assistance: Complete Independence Comments: antalgic     HOME EXERCISE PROGRAM: Access Code: [email or web address]: [email or web address] Date: 12/07/2022 Prepared by: JSharlynn Oliphant  Exercises - Supine Posterior Pelvic Tilt  - 2 x daily - 5 x weekly - 1 sets - 10 reps - 3s hold - Supine 90/90 Alternating Heel Touches with Posterior Pelvic Tilt  - 2 x daily - 5 x weekly - 1 sets - 2 reps - 30s hold - Supine 90/90 Abdominal Bracing  - 2 x daily - 5 x weekly - 1 sets - 2 reps - 30s hold [place]ult PT Treatment:                                                DATE: 01-11-23 [address] - therapeutic pool temp 92 degrees Pt enters building independently.  Treatment took place in water 3.8 to  4 ft 8 in.feet deep depending upon activity.  Pt entered and exited the pool via stair and handrails   Pain at initiation of RX session 6/10  at end  3/10 Aquatic Therapy:  Water walking for warm up fwd/sideways/backward Tanya ODoschwas educated on  beneficial therapeutic effects of water while ambulating to acclimate  to water walking forward, backward and side stepping.  Pt educated on neutral posture and hip hinging in seated position with water at chest level x 10 with stretch to low back and then x 10 with back at pool wall at external cue, VC for neck tucked to prevent hyperextension.  Standing: Walking march with kickboard push down alternating Leg extended hip flex/ext x20 BIL  Hip abd/add x20 BIL Squats x20 Lunge to target x 15 Gentle trunk rotation with pool noodle Heel raises - x20 Hip CCW and CW x 10 BIL Runners Stretch x 30" x 2 BIL Hamstring stretch x 30" x 2 BIL Step ups on submerged step x10 BIL fwd/lat Sitting on submerged bench: LAQ x1' Bicycle kick x1' Scissor kick x1' Bad Ragaz, Pt with lumbar belt around hips and nek doodle for neck support.   Pt assisted into supine floating position by lying head on shoulder of PT to get into floating position. . PT at torso and assisting with trunk left to right and vice versa to engage trunk muscles. PT then rotated trunk in order to engage abdominal (internal and external obliques) Emphasis on breathing techniques to draw in abdominals for support.  Pt then utilizing posterior chain and engaging Hip extension and knee flexion with water resistance while PT used Aqua stretch techniques to decrease muscle tension in low back.   TREATMENT 01/06/23: [address] therapeutic pool temp 92 degrees Pt enters building independently.  Treatment took place in water 3.8 to  4 ft 8 in.feet deep depending upon activity.  Pt entered and  exited the pool via stair and handrails    Aquatic Therapy:  Water walking for warm up fwd/lat/bkwds  Standing: Walking march with rainbow DB alternating push/pull Leg extended hip flex/ext x20 BIL (LOB) Hip abd/add x20 BIL Squats x20 Heel raises - x20 Green noodle stomp x20 ea Step ups on submerged step x10 BIL fwd/lat Sitting on submerged bench: LAQ x1' Bicycle kick  x1' Scissor kick x1'  NOT TODAY Lunge fwd x20 Lunge lateral x20 Step up and overs on submerged step 2x10 BIL Kickboard push down - 10x Kickboard - push/pull - 20x  Standing march   Pt requires the buoyancy of water for active assisted exercises with buoyancy supported for strengthening and AROM exercises. Hydrostatic pressure also supports joints by unweighting joint load by at least 50 % in 3-4 feet depth water. 80% in chest to neck deep water. Water will provide assistance with movement using the current and laminar flow while the buoyancy reduces weight bearing. Pt requires the viscosity of the water for resistance with strengthening exercises.  TREATMENT 01/05/23:  Aquatic therapy at [address] - therapeutic pool temp 92 degrees Pt enters building independently.  Treatment took place in water 3.8 to  4 ft 8 in.feet deep depending upon activity.  Pt entered and exited the pool via stair and handrails    Aquatic Therapy:  Water walking for warm up fwd/lat/bkwds  Standing: Walking march Standing march Hip abd/add x20 BIL Heel raises - x20 Green noodle stomp x20 ea Step ups on submerged step 2x10 BIL Kickboard push down - 10x Kickboard - push/pull - 20x   NOT TODAY Squats x20 Lunge fwd x20 Lunge lateral x20 Step up and overs on submerged step 2x10 BIL   Pt requires the buoyancy of water for active assisted exercises with buoyancy supported for strengthening and AROM exercises. Hydrostatic pressure also supports joints by unweighting joint load by at least 50 % in 3-4 feet depth water. 80% in chest to neck deep water. Water will provide assistance with movement using the current and laminar flow while the buoyancy reduces weight bearing. Pt requires the viscosity of the water for resistance with strengthening exercises.   ASSESSMENT:   CLINICAL IMPRESSION: Tanya Duncan presents to PT reporting continued lower back pain 6/10 and ending session of aquatics  with 3/10. Pt also educated on proper hip hinge in water with sitting and standing against pool wall. Session today focused on proximal hip and core strengthening as well as general conditioning in the aquatic environment for use of buoyancy to offload joints and the viscosity of water as resistance during therapeutic exercise. Pt was able to achieve 3/10 with Bad Ragaz techniques in aquatic environment. Patient was able to tolerate all prescribed exercises in the aquatic environment with no adverse effects. Reviewed HEP with patient today. Patient continues to benefit from skilled PT services on land and aquatic based and should be progressed as able to improve functional independence.     OBJECTIVE IMPAIRMENTS: Abnormal gait, decreased activity tolerance, decreased balance, decreased mobility, difficulty walking, decreased ROM, decreased strength, impaired perceived functional ability, impaired flexibility, improper body mechanics, postural dysfunction, and pain.    ACTIVITY LIMITATIONS: carrying, lifting, bending, standing, squatting, and stairs   PERSONAL FACTORS: Age, Fitness, Past/current experiences, Time since onset of injury/illness/exacerbation, and 1 comorbidity: RA  are also affecting patient's functional outcome.    REHAB POTENTIAL: Good   CLINICAL DECISION MAKING: Evolving/moderate complexity   EVALUATION COMPLEXITY: Moderate     GOALS: Goals  reviewed with patient? Yes   SHORT TERM GOALS: Target date: 12/28/2022     Begin aquatic program. Baseline: TBD Goal status: MET Initiated 01/05/23   2.  Patient to demonstrate independence in HEP  Baseline: [id number] Goal status: Ongoing Reviewed with patient on 01/06/23     LONG TERM GOALS: Target date: 01/18/2023     Increase trunk and core strength to 4/5 Baseline: 3/5 Goal status: INITIAL   2.  Increase FOTO score to 44 Baseline: 32 Goal status: INITIAL   3.  Patient to perform 5x STS with arms crossed in <20s Baseline:  TBD Goal status: INITIAL   4.  Decrease worst pain to 6/10 Baseline: 8/10 worst pain Goal status: INITIAL     PLAN:   PT FREQUENCY: 2x/week   PT DURATION: 6 weeks   PLANNED INTERVENTIONS: Therapeutic exercises, Therapeutic activity, Neuromuscular re-education, Balance training, Gait training, Patient/Family education, Self Care, Joint mobilization, Stair training, Aquatic Therapy, Dry Needling, Manual therapy, and Re-evaluation.   PLAN FOR NEXT SESSION: Initiate aquatic therapy, core and trunk strengthening, postural exercises, stretching tasks    Voncille Lo, PT, [facility] Certified Exercise Expert for the Aging Adult  01/11/23 4:10 PM Phone: [phone] Fax: [phone]

## 2023-01-12 NOTE — Therapy (Signed)
OUTPATIENT PHYSICAL THERAPY TREATMENT NOTE   Patient Name: Tanya Duncan MRN: [id number] DOB:[birth year], 77 y.o., female Today's Date: 01/13/2023  PCP: Bernerd Limbo, MD    REFERRING PROVIDER: Juanda Chance    PT End of Session - 01/13/23 1319     Visit Number 5    Number of Visits 12    Date for PT Re-Evaluation 02/01/23    Authorization Type MCR    PT Start Time 1318    PT Stop Time 1400    PT Time Calculation (min) 42 min    Activity Tolerance Patient tolerated treatment well    Behavior During Therapy [facility] for tasks assessed/performed               Past Medical History:  Diagnosis Date   Anxiety    Arthritis    Blood transfusion without reported diagnosis 05/2019   with hip surgery   Fibromyalgia    Fractured pelvis ([place]) 09/15/2020   right   GERD (gastroesophageal reflux disease)    GI bleed 2035,5974   x2   Hyperlipidemia    Pneumonia 2005   Stomach ulcer 1990   Past Surgical History:  Procedure Laterality Date   ABDOMINAL HYSTERECTOMY     FRACTURE SURGERY Right 1998   ankle   JOINT REPLACEMENT     LUMBAR FUSION  2015   OPEN REDUCTION INTERNAL FIXATION (ORIF) PROXIMAL PHALANX Right 09/21/2020   Procedure: OPEN TREATMENT OF RIGHT LONG FINGER PROXIMAL PHALANX FRACTURE;  Surgeon: Milly Jakob, MD;  Location: [place];  Service: Orthopedics;  Laterality: Right;  LENGTH OF SURGERY: 75 MIN   ORIF HUMERUS FRACTURE Left    ORIF TIBIA FRACTURE Right 1998   with bone graft   PYLOROPLASTY     REVERSE SHOULDER ARTHROPLASTY Left 02/04/2021   Procedure: REVERSE SHOULDER ARTHROPLASTY WITH HARDWARE REMOVAL;  Surgeon: Tania Ade, MD;  Location: [facility];  Service: Orthopedics;  Laterality: Left;   TOTAL HIP ARTHROPLASTY  2011   TOTAL HIP ARTHROPLASTY Right 05/14/2019   Procedure: Right Anterior Hip Arthroplasty;  Surgeon: Melrose Nakayama, MD;  Location: [facility];  Service: Orthopedics;  Laterality: Right;   TOTAL SHOULDER REPLACEMENT  2014   Patient Active  Problem List   Diagnosis Date Noted   Palpitations 09/01/2020   Chest pain 09/01/2020   Abnormal ECG 09/01/2020   Moderate episode of recurrent major depressive disorder ([place]) 06/22/2020   Prediabetes 06/22/2020   Mixed hyperlipidemia 06/22/2020   History of total right hip arthroplasty 05/17/2019   Anxiety    Primary osteoarthritis of right hip 05/14/2019   Vitamin D deficiency 01/10/2019   Insomnia due to anxiety and fear 10/25/2017   Chronic prescription benzodiazepine use 10/25/2017   Chronic bilateral low back pain without sciatica 04/27/2017   Osteoarthritis 01/06/2017   GAD (generalized anxiety disorder) 01/06/2017   Rheumatoid arthritis involving multiple sites with positive rheumatoid factor ([place]) 12/12/2016   Status post lumbar spinal fusion 12/12/2016   Fibromyalgia 12/12/2016    THERAPY DIAG:  Other low back pain  Rheumatoid arthritis involving multiple sites, unspecified whether rheumatoid factor present ([place])  Fusion of lumbar spine   Rationale for Evaluation and Treatment Rehabilitation  REFERRING DIAG: M05.79 (ICD-10-CM) - Rheumatoid arthritis with rheumatoid factor of multiple sites without organ or systems involvement M54.50 (ICD-10-CM) - Low back pain, unspecified G89.29 (ICD-10-CM) - Other chronic pain   PERTINENT HISTORY: Lumbar fusion and foraminotomy 10 years ago, B THA, hx of L TSA reverse   PRECAUTIONS/RESTRICTIONS:   lumbar  fusion, RA  SUBJECTIVE:  Patient reports increased muscle soreness after last aquatic session.  PAIN:  Are you having pain? Yes: NPRS scale: 0/10 Pain location: sacral region  Pain description: ache  Aggravating factors: activity Relieving factors: meds and thermal modalities  OBJECTIVE: (objective measures completed at initial evaluation unless otherwise dated)  DIAGNOSTIC FINDINGS:  CLINICAL DATA:  Sacral pain since the patient suffered a fall when she was pulled down by her dog 09/08/2020.   EXAM: MRI LUMBAR  SPINE WITHOUT CONTRAST   TECHNIQUE: Multiplanar, multisequence MR imaging of the lumbar spine was performed. No intravenous contrast was administered.   COMPARISON:  CT of the right hip 10/20/2020. MRI of the right hip 02/01/2020.   FINDINGS: Bones/Joint/Cartilage   There is marrow edema throughout the left sacrum due to a mildly displaced fracture. A small focus of marrow edema is seen in the left ilium adjacent to the SI joint consistent with nondisplaced fracture. Also seen is a mild inferior endplate compression fracture of L5 eccentric to the right with vertebral body height loss of approximately 10% and associated marrow edema.   There is partial visualization of lumbar fusion hardware extending from L4 above the superior aspect of the scan. Artifact from bilateral hip replacements also noted. No worrisome lesion is identified.   Ligaments   Negative.   Muscles and Tendons   Intact.   Soft tissues   Sigmoid diverticulosis noted.   IMPRESSION: Acute or subacute minimally displaced fracture of the left sacrum. There is also a small fracture in the left ilium adjacent to the SI joint.   Acute or subacute mild inferior endplate compression fracture L5.   Diverticulosis.     Electronically Signed   By: Inge Rise M.D.   On: 12/16/2020 09:57   PATIENT SURVEYS:  FOTO 32(44 predicted)   SCREENING FOR RED FLAGS: Bowel or bladder incontinence: No   COGNITION: Overall cognitive status: Within functional limits for tasks assessed                          SENSATION: Not tested   MUSCLE LENGTH: Hamstrings: Right 90 deg; Left 90 deg     POSTURE: decreased lumbar lordosis and side bent R with R knee flexed  in valgus with pronated R ankle   PALPATION: NT   LUMBAR ROM:    AROM eval  Flexion 75%  Extension 25%  Right lateral flexion 25%  Left lateral flexion 25%  Right rotation 90%  Left rotation 75%   (Blank rows = not tested)   LOWER  EXTREMITY ROM:   WFL   Active  Right eval Left eval  Hip flexion      Hip extension      Hip abduction      Hip adduction      Hip internal rotation      Hip external rotation      Knee flexion      Knee extension      Ankle dorsiflexion      Ankle plantarflexion      Ankle inversion      Ankle eversion       (Blank rows = not tested)   LOWER EXTREMITY MMT:  [facility]   MMT Right eval Left eval  Hip flexion      Hip extension      Hip abduction      Hip adduction      Hip internal rotation  Hip external rotation      Knee flexion      Knee extension      Ankle dorsiflexion      Ankle plantarflexion      Ankle inversion      Ankle eversion      Core/trunk 3 3   (Blank rows = not tested)   LUMBAR SPECIAL TESTS:  Deferred due to fusion   FUNCTIONAL TESTS:  NT   GAIT: Distance walked: 68ft x2 Assistive device utilized: None Level of assistance: Complete [place] Comments: antalgic     HOME EXERCISE PROGRAM: Access Code: [email or web address] URL: [email or web address] Date: 12/07/2022 Prepared by: Sharlynn Oliphant   Exercises - Supine Posterior Pelvic Tilt  - 2 x daily - 5 x weekly - 1 sets - 10 reps - 3s hold - Supine 90/90 Alternating Heel Touches with Posterior Pelvic Tilt  - 2 x daily - 5 x weekly - 1 sets - 2 reps - 30s hold - Supine 90/90 Abdominal Bracing  - 2 x daily - 5 x weekly - 1 sets - 2 reps - 30s hold  [place] Adult PT Treatment:                                                DATE: 01/13/23 Aquatic therapy at [address] - therapeutic pool temp 92 degrees Pt enters building independently.  Treatment took place in water 3.8 to  4 ft 8 in.feet deep depending upon activity.  Pt entered and exited the pool via stair and handrails    Aquatic Therapy:  Water walking for warm up fwd/lat/bkwds  Standing: Walking march with rainbow DB alternating push/pull Holding onto barbell: 3 way hip x10 each BIL Leg extended hip flex/ext  x20 BIL Hip abd/add x20 BIL Squats x20 Heel raises - x20 Green noodle stomp x20 ea (CGA) Sitting on submerged bench: LAQ x1' Bicycle kick x1' Scissor kick x1' Kickboard push/pull x1' Kickboard push down x1' NOT TODAY Lunge fwd x20 Lunge lateral x20 Step up and overs on submerged step 2x10 BIL Kickboard push down - 10x Kickboard - push/pull - 20x  Standing march   Pt requires the buoyancy of water for active assisted exercises with buoyancy supported for strengthening and AROM exercises. Hydrostatic pressure also supports joints by unweighting joint load by at least 50 % in 3-4 feet depth water. 80% in chest to neck deep water. Water will provide assistance with movement using the current and laminar flow while the buoyancy reduces weight bearing. Pt requires the viscosity of the water for resistance with strengthening exercises.  [facility] Adult PT Treatment:                                                DATE: 01-11-23 Aquatic therapy at [address] - therapeutic pool temp 92 degrees Pt enters building independently.  Treatment took place in water 3.8 to  4 ft 8 in.feet deep depending upon activity.  Pt entered and exited the pool via stair and handrails   Pain at initiation of RX session 6/10  at end  3/10 Aquatic Therapy:  Water walking for warm up fwd/sideways/backward Ms Lesko was educated on  beneficial therapeutic effects of water while  ambulating to acclimate to water walking forward, backward and side stepping.  Pt educated on neutral posture and hip hinging in seated position with water at chest level x 10 with stretch to low back and then x 10 with back at pool wall at external cue, VC for neck tucked to prevent hyperextension.  Standing: Walking march with kickboard push down alternating Leg extended hip flex/ext x20 BIL  Hip abd/add x20 BIL Squats x20 Lunge to target x 15 Gentle trunk rotation with pool noodle Heel raises - x20 Hip CCW and CW x 10  BIL Runners Stretch x 30" x 2 BIL Hamstring stretch x 30" x 2 BIL Step ups on submerged step x10 BIL fwd/lat Sitting on submerged bench: LAQ x1' Bicycle kick x1' Scissor kick x1' [place], Pt with lumbar belt around hips and nek doodle for neck support.   Pt assisted into supine floating position by lying head on shoulder of PT to get into floating position. . PT at torso and assisting with trunk left to right and vice versa to engage trunk muscles. PT then rotated trunk in order to engage abdominal (internal and external obliques) Emphasis on breathing techniques to draw in abdominals for support.  Pt then utilizing posterior chain and engaging Hip extension and knee flexion with water resistance while PT used Aqua stretch techniques to decrease muscle tension in low back.   TREATMENT 01/06/23: Aquatic therapy at [address] - therapeutic pool temp 92 degrees Pt enters building independently.  Treatment took place in water 3.8 to  4 ft 8 in.feet deep depending upon activity.  Pt entered and exited the pool via stair and handrails    Aquatic Therapy:  Water walking for warm up fwd/lat/bkwds  Standing: Walking march with rainbow DB alternating push/pull Leg extended hip flex/ext x20 BIL (LOB) Hip abd/add x20 BIL Squats x20 Heel raises - x20 Green noodle stomp x20 ea Step ups on submerged step x10 BIL fwd/lat Sitting on submerged bench: LAQ x1' Bicycle kick x1' Scissor kick x1'  NOT TODAY Lunge fwd x20 Lunge lateral x20 Step up and overs on submerged step 2x10 BIL Kickboard push down - 10x Kickboard - push/pull - 20x  Standing march   Pt requires the buoyancy of water for active assisted exercises with buoyancy supported for strengthening and AROM exercises. Hydrostatic pressure also supports joints by unweighting joint load by at least 50 % in 3-4 feet depth water. 80% in chest to neck deep water. Water will provide assistance with movement using the current  and laminar flow while the buoyancy reduces weight bearing. Pt requires the viscosity of the water for resistance with strengthening exercises.    ASSESSMENT:   CLINICAL IMPRESSION: Patient presents to aquatic PT session reporting no current back pain, but increased soreness after last aquatic session. Session today focused on upright posture, strengthening, and general conditioning in the aquatic environment for use of buoyancy to offload joints and the viscosity of water as resistance during therapeutic exercise. Patient was able to tolerate all prescribed exercises in the aquatic environment with no adverse effects. Patient continues to benefit from skilled PT services on land and aquatic based and should be progressed as able to improve functional independence.      OBJECTIVE IMPAIRMENTS: Abnormal gait, decreased activity tolerance, decreased balance, decreased mobility, difficulty walking, decreased ROM, decreased strength, impaired perceived functional ability, impaired flexibility, improper body mechanics, postural dysfunction, and pain.    ACTIVITY LIMITATIONS: carrying, lifting, bending, standing, squatting, and stairs  PERSONAL FACTORS: Age, Fitness, Past/current experiences, Time since onset of injury/illness/exacerbation, and 1 comorbidity: RA  are also affecting patient's functional outcome.    REHAB POTENTIAL: Good   CLINICAL DECISION MAKING: Evolving/moderate complexity   EVALUATION COMPLEXITY: Moderate     GOALS: Goals reviewed with patient? Yes   SHORT TERM GOALS: Target date: 12/28/2022     Begin aquatic program. Baseline: TBD Goal status: MET Initiated 01/05/23   2.  Patient to demonstrate independence in HEP  Baseline: [id number] Goal status: Ongoing Reviewed with patient on 01/06/23     LONG TERM GOALS: Target date: 01/18/2023     Increase trunk and core strength to 4/5 Baseline: 3/5 Goal status: INITIAL   2.  Increase FOTO score to 44 Baseline:  32 Goal status: INITIAL   3.  Patient to perform 5x STS with arms crossed in <20s Baseline: TBD Goal status: INITIAL   4.  Decrease worst pain to 6/10 Baseline: 8/10 worst pain Goal status: INITIAL     PLAN:   PT FREQUENCY: 2x/week   PT DURATION: 6 weeks   PLANNED INTERVENTIONS: Therapeutic exercises, Therapeutic activity, Neuromuscular re-education, Balance training, Gait training, Patient/Family education, Self Care, Joint mobilization, Stair training, Aquatic Therapy, Dry Needling, Manual therapy, and Re-evaluation.   PLAN FOR NEXT SESSION: Initiate aquatic therapy, core and trunk strengthening, postural exercises, stretching tasks    Margarette [place], [place] 01/13/23 1:58 PM

## 2023-01-13 ENCOUNTER — Ambulatory Visit: Payer: Medicare Other

## 2023-01-13 DIAGNOSIS — M5459 Other low back pain: Secondary | ICD-10-CM | POA: Diagnosis not present

## 2023-01-13 DIAGNOSIS — M069 Rheumatoid arthritis, unspecified: Secondary | ICD-10-CM

## 2023-01-13 DIAGNOSIS — M4326 Fusion of spine, lumbar region: Secondary | ICD-10-CM

## 2023-01-16 ENCOUNTER — Other Ambulatory Visit: Payer: Self-pay | Admitting: Family Medicine

## 2023-01-16 DIAGNOSIS — Z1231 Encounter for screening mammogram for malignant neoplasm of breast: Secondary | ICD-10-CM

## 2023-01-16 NOTE — Therapy (Unsigned)
OUTPATIENT PHYSICAL THERAPY TREATMENT NOTE   Patient Name: Tanya Duncan MRN: [id number] DOB:[birth year], 77 y.o., female Today's Date: 01/17/2023  PCP: Bernerd Limbo, MD    REFERRING PROVIDER: Juanda Chance    PT End of Session - 01/17/23 1211     Visit Number 6    Number of Visits 12    Date for PT Re-Evaluation 02/01/23    Authorization Type MCR    PT Start Time 1215    PT Stop Time 1255    PT Time Calculation (min) 40 min    Activity Tolerance Patient tolerated treatment well    Behavior During Therapy [facility] for tasks assessed/performed                Past Medical History:  Diagnosis Date   Anxiety    Arthritis    Blood transfusion without reported diagnosis 05/2019   with hip surgery   Fibromyalgia    Fractured pelvis ([place]) 09/15/2020   right   GERD (gastroesophageal reflux disease)    GI bleed [id number]   x2   Hyperlipidemia    Pneumonia 2005   Stomach ulcer 1990   Past Surgical History:  Procedure Laterality Date   ABDOMINAL HYSTERECTOMY     FRACTURE SURGERY Right 1998   ankle   JOINT REPLACEMENT     LUMBAR FUSION  2015   OPEN REDUCTION INTERNAL FIXATION (ORIF) PROXIMAL PHALANX Right 09/21/2020   Procedure: OPEN TREATMENT OF RIGHT LONG FINGER PROXIMAL PHALANX FRACTURE;  Surgeon: Milly Jakob, MD;  Location: [facility];  Service: Orthopedics;  Laterality: Right;  LENGTH OF SURGERY: 75 MIN   ORIF HUMERUS FRACTURE Left    ORIF TIBIA FRACTURE Right 1998   with bone graft   PYLOROPLASTY     REVERSE SHOULDER ARTHROPLASTY Left 02/04/2021   Procedure: REVERSE SHOULDER ARTHROPLASTY WITH HARDWARE REMOVAL;  Surgeon: Tania Ade, MD;  Location: [facility];  Service: Orthopedics;  Laterality: Left;   TOTAL HIP ARTHROPLASTY  2011   TOTAL HIP ARTHROPLASTY Right 05/14/2019   Procedure: Right Anterior Hip Arthroplasty;  Surgeon: Melrose Nakayama, MD;  Location: [facility];  Service: Orthopedics;  Laterality: Right;   TOTAL SHOULDER REPLACEMENT  2014   Patient  Active Problem List   Diagnosis Date Noted   Palpitations 09/01/2020   Chest pain 09/01/2020   Abnormal ECG 09/01/2020   Moderate episode of recurrent major depressive disorder ([place]) 06/22/2020   Prediabetes 06/22/2020   Mixed hyperlipidemia 06/22/2020   History of total right hip arthroplasty 05/17/2019   Anxiety    Primary osteoarthritis of right hip 05/14/2019   Vitamin D deficiency 01/10/2019   Insomnia due to anxiety and fear 10/25/2017   Chronic prescription benzodiazepine use 10/25/2017   Chronic bilateral low back pain without sciatica 04/27/2017   Osteoarthritis 01/06/2017   GAD (generalized anxiety disorder) 01/06/2017   Rheumatoid arthritis involving multiple sites with positive rheumatoid factor ([place]) 12/12/2016   Status post lumbar spinal fusion 12/12/2016   Fibromyalgia 12/12/2016    THERAPY DIAG:  Chronic low back pain   S/P lumbar fusion    Rationale for Evaluation and Treatment Rehabilitation  REFERRING DIAG: M05.79 (ICD-10-CM) - Rheumatoid arthritis with rheumatoid factor of multiple sites without organ or systems involvement M54.50 (ICD-10-CM) - Low back pain, unspecified G89.29 (ICD-10-CM) - Other chronic pain   PERTINENT HISTORY: Lumbar fusion and foraminotomy 10 years ago, B THA, hx of L TSA reverse   PRECAUTIONS/RESTRICTIONS:   lumbar fusion, RA  SUBJECTIVE: Reports 8/10 pain when walking, better  when using 4WW.  Aquatic program has been helpful but can be difficult.  Wishes to continue with an aquatic based program and transition to a HEP involving a pool.   PAIN:  Are you having pain? Yes: NPRS scale: 0/10 Pain location: sacral region  Pain description: ache  Aggravating factors: activity Relieving factors: meds and thermal modalities  OBJECTIVE: (objective measures completed at initial evaluation unless otherwise dated)  DIAGNOSTIC FINDINGS:  CLINICAL DATA:  Sacral pain since the patient suffered a fall when she was pulled down by her dog  09/08/2020.   EXAM: MRI LUMBAR SPINE WITHOUT CONTRAST   TECHNIQUE: Multiplanar, multisequence MR imaging of the lumbar spine was performed. No intravenous contrast was administered.   COMPARISON:  CT of the right hip 10/20/2020. MRI of the right hip 02/01/2020.   FINDINGS: Bones/Joint/Cartilage   There is marrow edema throughout the left sacrum due to a mildly displaced fracture. A small focus of marrow edema is seen in the left ilium adjacent to the SI joint consistent with nondisplaced fracture. Also seen is a mild inferior endplate compression fracture of L5 eccentric to the right with vertebral body height loss of approximately 10% and associated marrow edema.   There is partial visualization of lumbar fusion hardware extending from L4 above the superior aspect of the scan. Artifact from bilateral hip replacements also noted. No worrisome lesion is identified.   Ligaments   Negative.   Muscles and Tendons   Intact.   Soft tissues   Sigmoid diverticulosis noted.   IMPRESSION: Acute or subacute minimally displaced fracture of the left sacrum. There is also a small fracture in the left ilium adjacent to the SI joint.   Acute or subacute mild inferior endplate compression fracture L5.   Diverticulosis.     Electronically Signed   By: Inge Rise M.D.   On: 12/16/2020 09:57   PATIENT SURVEYS:  FOTO 32(44 predicted); 01/17/23 44   SCREENING FOR RED FLAGS: Bowel or bladder incontinence: No   COGNITION: Overall cognitive status: Within functional limits for tasks assessed                          SENSATION: Not tested   MUSCLE LENGTH: Hamstrings: Right 90 deg; Left 90 deg     POSTURE: decreased lumbar lordosis and side bent R with R knee flexed  in valgus with pronated R ankle   PALPATION: NT   LUMBAR ROM:    AROM eval  Flexion 75%  Extension 25%  Right lateral flexion 25%  Left lateral flexion 25%  Right rotation 90%  Left rotation  75%   (Blank rows = not tested)   LOWER EXTREMITY ROM:   WFL   Active  Right eval Left eval  Hip flexion      Hip extension      Hip abduction      Hip adduction      Hip internal rotation      Hip external rotation      Knee flexion      Knee extension      Ankle dorsiflexion      Ankle plantarflexion      Ankle inversion      Ankle eversion       (Blank rows = not tested)   LOWER EXTREMITY MMT:  [facility]   MMT Right eval Left eval  Hip flexion      Hip extension      Hip  abduction      Hip adduction      Hip internal rotation      Hip external rotation      Knee flexion      Knee extension      Ankle dorsiflexion      Ankle plantarflexion      Ankle inversion      Ankle eversion      Core/trunk 3 3   (Blank rows = not tested)   LUMBAR SPECIAL TESTS:  Deferred due to fusion   FUNCTIONAL TESTS:  NT   GAIT: Distance walked: 35f x2 Assistive device utilized: None Level of assistance: Complete Independence Comments: antalgic     HOME EXERCISE PROGRAM: Access Code: [email or web address]: [email or web address] Date: 12/07/2022 Prepared by: JSharlynn Oliphant  Exercises - Supine Posterior Pelvic Tilt  - 2 x daily - 5 x weekly - 1 sets - 10 reps - 3s hold - Supine 90/90 Alternating Heel Touches with Posterior Pelvic Tilt  - 2 x daily - 5 x weekly - 1 sets - 2 reps - 30s hold - Supine 90/90 Abdominal Bracing  - 2 x daily - 5 x weekly - 1 sets - 2 reps - 30s hold   [facility] Adult PT Treatment:                                                DATE: 01/17/23 Therapeutic Exercise: Nustep L1 6 min Seated latissimus press 3s hold 15x Seated lat press with alternating LAQs 15/15 Seated lat press with alternating marches 10/10 Seated hip toss 0# 10/10 Seated V's 0# 10/10 Seated chops 0# 10/10 TTP at L piriformis   [facility] Adult PT Treatment:                                                DATE: 01/13/23 [address]atic therapy [address] - therapeutic  pool temp 92 degrees Pt enters building independently.  Treatment took place in water 3.8 to  4 ft 8 in.feet deep depending upon activity.  Pt entered and exited the pool via stair and handrails    Aquatic Therapy:  Water walking for warm up fwd/lat/bkwds  Standing: Walking march with rainbow DB alternating push/pull Holding onto barbell: 3 way hip x10 each BIL Leg extended hip flex/ext x20 BIL Hip abd/add x20 BIL Squats x20 Heel raises - x20 Green noodle stomp x20 ea (CGA) Sitting on submerged bench: LAQ x1' Bicycle kick x1' Scissor kick x1' Kickboard push/pull x1' Kickboard push down x1' NOT TODAY Lunge fwd x20 Lunge lateral x20 Step up and overs on submerged step 2x10 BIL Kickboard push down - 10x Kickboard - push/pull - 20x  Standing march   Pt requires the buoyancy of water for active assisted exercises with buoyancy supported for strengthening and AROM exercises. Hydrostatic pressure also supports joints by unweighting joint load by at least 50 % in 3-4 feet depth water. 80% in chest to neck deep water. Water will provide assistance with movement using the current and laminar flow while the buoyancy reduces weight bearing. Pt requires the viscosity of the water for resistance with strengthening exercises.  [facility]  [facility]: 01-[facility]-23 Aquatic therapy at [address] - therapeutic pool temp 92 degrees Pt enters building independently.  Treatment took place in water 3.8 to  4 ft 8 in.feet deep depending upon activity.  Pt entered and exited the pool via stair and handrails   Pain at initiation of RX session 6/10  at end  3/10 Aquatic Therapy:  Water walking for warm up fwd/sideways/backward Ms Boshell was educated on  beneficial therapeutic effects of water while ambulating to acclimate to water walking forward, backward and side stepping.  Pt educated on neutral posture and hip hinging in seated  position with water at chest level x 10 with stretch to low back and then x 10 with back at pool wall at external cue, VC for neck tucked to prevent hyperextension.  Standing: Walking march with kickboard push down alternating Leg extended hip flex/ext x20 BIL  Hip abd/add x20 BIL Squats x20 Lunge to target x 15 Gentle trunk rotation with pool noodle Heel raises - x20 Hip CCW and CW x 10 BIL Runners Stretch x 30" x 2 BIL Hamstring stretch x 30" x 2 BIL Step ups on submerged step x10 BIL fwd/lat Sitting on submerged bench: LAQ x1' Bicycle kick x1' Scissor kick x1' [place], Pt with lumbar belt around hips and nek doodle for neck support.   Pt assisted into supine floating position by lying head on shoulder of PT to get into floating position. . PT at torso and assisting with trunk left to right and vice versa to engage trunk muscles. PT then rotated trunk in order to engage abdominal (internal and external obliques) Emphasis on breathing techniques to draw in abdominals for support.  Pt then utilizing posterior chain and engaging Hip extension and knee flexion with water resistance while PT used Aqua stretch techniques to decrease muscle tension in low back.   TREATMENT 01/06/23: Aquatic therapy at [address] - therapeutic pool temp 92 degrees Pt enters building independently.  Treatment took place in water 3.8 to  4 ft 8 in.feet deep depending upon activity.  Pt entered and exited the pool via stair and handrails    Aquatic Therapy:  Water walking for warm up fwd/lat/bkwds  Standing: Walking march with rainbow DB alternating push/pull Leg extended hip flex/ext x20 BIL (LOB) Hip abd/add x20 BIL Squats x20 Heel raises - x20 Green noodle stomp x20 ea Step ups on submerged step x10 BIL fwd/lat Sitting on submerged bench: LAQ x1' Bicycle kick x1' Scissor kick x1'  NOT TODAY Lunge fwd x20 Lunge lateral x20 Step up and overs on submerged step 2x10  BIL Kickboard push down - 10x Kickboard - push/pull - 20x  Standing march   Pt requires the buoyancy of water for active assisted exercises with buoyancy supported for strengthening and AROM exercises. Hydrostatic pressure also supports joints by unweighting joint load by at least 50 % in 3-4 feet depth water. 80% in chest to neck deep water. Water will provide assistance with movement using the current and laminar flow while the buoyancy reduces weight bearing. Pt requires the viscosity of the water for resistance with strengthening exercises.    ASSESSMENT:   CLINICAL IMPRESSION: Patient returns for first f/u land based session.  Feels the pool is helpful but also challenging and she agrees she may be over exerting.  FOTO goal met today.  HEP reviewed and patient performing properly.  Added additional core strengthening tasks in seated position to minimize trunk/spine motion.  Able to  complete all tasks but some L gluteal and referred lateral thigh symptoms reported.  Palpation finds marked point tenderness to L piriformis.  Discussed TPDN which patient has had and is agreeable to technique at next session.  Will assess tolerance to todays exercise and add to HEP if appropriate at next session.     OBJECTIVE IMPAIRMENTS: Abnormal gait, decreased activity tolerance, decreased balance, decreased mobility, difficulty walking, decreased ROM, decreased strength, impaired perceived functional ability, impaired flexibility, improper body mechanics, postural dysfunction, and pain.    ACTIVITY LIMITATIONS: carrying, lifting, bending, standing, squatting, and stairs   PERSONAL FACTORS: Age, Fitness, Past/current experiences, Time since onset of injury/illness/exacerbation, and 1 comorbidity: RA  are also affecting patient's functional outcome.    REHAB POTENTIAL: Good   CLINICAL DECISION MAKING: Evolving/moderate complexity   EVALUATION COMPLEXITY: Moderate     GOALS: Goals reviewed with  patient? Yes   SHORT TERM GOALS: Target date: 12/28/2022     Begin aquatic program. Baseline: TBD Goal status: MET Initiated 01/05/23   2.  Patient to demonstrate independence in HEP  Baseline: [id number] Goal status: Met 01/17/23 Reviewed with patient on 01/06/23     LONG TERM GOALS: Target date: 01/18/2023     Increase trunk and core strength to 4/5 Baseline: 3/5 Goal status: Progressing   2.  Increase FOTO score to 44 Baseline: 32; 01/17/23 44  Goal status: Met   3.  Patient to perform 5x STS with arms crossed in <20s Baseline: TBD Goal status: Ongoing   4.  Decrease worst pain to 6/10 Baseline: 8/10 worst pain Goal status: Ongoing     PLAN:   PT FREQUENCY: 2x/week   PT DURATION: 6 weeks   PLANNED INTERVENTIONS: Therapeutic exercises, Therapeutic activity, Neuromuscular re-education, Balance training, Gait training, Patient/Family education, Self Care, Joint mobilization, Stair training, Aquatic Therapy, Dry Needling, Manual therapy, and Re-evaluation.   PLAN FOR NEXT SESSION: Initiate aquatic therapy, core and trunk strengthening, postural exercises, stretching tasks    Leroy Sea PT  01/17/23 12:32 PM

## 2023-01-17 ENCOUNTER — Ambulatory Visit: Payer: Medicare Other

## 2023-01-17 DIAGNOSIS — M5459 Other low back pain: Secondary | ICD-10-CM | POA: Diagnosis not present

## 2023-01-17 DIAGNOSIS — M4326 Fusion of spine, lumbar region: Secondary | ICD-10-CM

## 2023-01-17 DIAGNOSIS — M069 Rheumatoid arthritis, unspecified: Secondary | ICD-10-CM

## 2023-01-20 ENCOUNTER — Ambulatory Visit: Payer: Medicare Other

## 2023-01-20 DIAGNOSIS — M069 Rheumatoid arthritis, unspecified: Secondary | ICD-10-CM

## 2023-01-20 DIAGNOSIS — M4326 Fusion of spine, lumbar region: Secondary | ICD-10-CM

## 2023-01-20 DIAGNOSIS — M5459 Other low back pain: Secondary | ICD-10-CM | POA: Diagnosis not present

## 2023-01-20 NOTE — Therapy (Signed)
OUTPATIENT PHYSICAL THERAPY TREATMENT NOTE   Patient Name: Tanya Duncan MRN: [id number] DOB:07/30/1946, 77 y.o., female Today's Date: 01/20/2023  PCP: Bernerd Limbo, MD    REFERRING PROVIDER: Juanda Chance    PT End of Session - 01/20/23 1216     Visit Number 7    Number of Visits 12    Date for PT Re-Evaluation 02/01/23    Authorization Type MCR    PT Start Time 1216    PT Stop Time 1300    PT Time Calculation (min) 44 min    Activity Tolerance Patient tolerated treatment well    Behavior During Therapy [facility] for tasks assessed/performed                Past Medical History:  Diagnosis Date   Anxiety    Arthritis    Blood transfusion without reported diagnosis 05/2019   with hip surgery   Fibromyalgia    Fractured pelvis ([place]) 09/15/2020   right   GERD (gastroesophageal reflux disease)    GI bleed [id number]   x2   Hyperlipidemia    Pneumonia 2005   Stomach ulcer 1990   Past Surgical History:  Procedure Laterality Date   ABDOMINAL HYSTERECTOMY     FRACTURE SURGERY Right 1998   ankle   JOINT REPLACEMENT     LUMBAR FUSION  2015   OPEN REDUCTION INTERNAL FIXATION (ORIF) PROXIMAL PHALANX Right 09/21/2020   Procedure: OPEN TREATMENT OF RIGHT LONG FINGER PROXIMAL PHALANX FRACTURE;  Surgeon: Milly Jakob, MD;  Location: [place];  Service: Orthopedics;  Laterality: Right;  LENGTH OF SURGERY: 75 MIN   ORIF HUMERUS FRACTURE Left    ORIF TIBIA FRACTURE Right 1998   with bone graft   PYLOROPLASTY     REVERSE SHOULDER ARTHROPLASTY Left 02/04/2021   Procedure: REVERSE SHOULDER ARTHROPLASTY WITH HARDWARE REMOVAL;  Surgeon: Tania Ade, MD;  Location: [facility];  Service: Orthopedics;  Laterality: Left;   TOTAL HIP ARTHROPLASTY  2011   TOTAL HIP ARTHROPLASTY Right 05/14/2019   Procedure: Right Anterior Hip Arthroplasty;  Surgeon: Melrose Nakayama, MD;  Location: [facility];  Service: Orthopedics;  Laterality: Right;   TOTAL SHOULDER REPLACEMENT  2014   Patient  Active Problem List   Diagnosis Date Noted   Palpitations 09/01/2020   Chest pain 09/01/2020   Abnormal ECG 09/01/2020   Moderate episode of recurrent major depressive disorder ([place]) 06/22/2020   Prediabetes 06/22/2020   Mixed hyperlipidemia 06/22/2020   History of total right hip arthroplasty 05/17/2019   Anxiety    Primary osteoarthritis of right hip 05/14/2019   Vitamin D deficiency 01/10/2019   Insomnia due to anxiety and fear 10/25/2017   Chronic prescription benzodiazepine use 10/25/2017   Chronic bilateral low back pain without sciatica 04/27/2017   Osteoarthritis 01/06/2017   GAD (generalized anxiety disorder) 01/06/2017   Rheumatoid arthritis involving multiple sites with positive rheumatoid factor ([place]) 12/12/2016   Status post lumbar spinal fusion 12/12/2016   Fibromyalgia 12/12/2016    THERAPY DIAG:  Chronic low back pain   S/P lumbar fusion    Rationale for Evaluation and Treatment Rehabilitation  REFERRING DIAG: M05.79 (ICD-10-CM) - Rheumatoid arthritis with rheumatoid factor of multiple sites without organ or systems involvement M54.50 (ICD-10-CM) - Low back pain, unspecified G89.29 (ICD-10-CM) - Other chronic pain   PERTINENT HISTORY: Lumbar fusion and foraminotomy 10 years ago, B THA, hx of L TSA reverse   PRECAUTIONS/RESTRICTIONS:   lumbar fusion, RA  SUBJECTIVE: Reports 8/10 pain when walking, better  when using 4WW.  Aquatic program has been helpful but can be difficult.  Wishes to continue with an aquatic based program and transition to a HEP involving a pool.   PAIN:  Are you having pain? Yes: NPRS scale: 0/10 Pain location: sacral region  Pain description: ache  Aggravating factors: activity Relieving factors: meds and thermal modalities  OBJECTIVE: (objective measures completed at initial evaluation unless otherwise dated)  DIAGNOSTIC FINDINGS:  CLINICAL DATA:  Sacral pain since the patient suffered a fall when she was pulled down by her dog  09/08/2020.   EXAM: MRI LUMBAR SPINE WITHOUT CONTRAST   TECHNIQUE: Multiplanar, multisequence MR imaging of the lumbar spine was performed. No intravenous contrast was administered.   COMPARISON:  CT of the right hip 10/20/2020. MRI of the right hip 02/01/2020.   FINDINGS: Bones/Joint/Cartilage   There is marrow edema throughout the left sacrum due to a mildly displaced fracture. A small focus of marrow edema is seen in the left ilium adjacent to the SI joint consistent with nondisplaced fracture. Also seen is a mild inferior endplate compression fracture of L5 eccentric to the right with vertebral body height loss of approximately 10% and associated marrow edema.   There is partial visualization of lumbar fusion hardware extending from L4 above the superior aspect of the scan. Artifact from bilateral hip replacements also noted. No worrisome lesion is identified.   Ligaments   Negative.   Muscles and Tendons   Intact.   Soft tissues   Sigmoid diverticulosis noted.   IMPRESSION: Acute or subacute minimally displaced fracture of the left sacrum. There is also a small fracture in the left ilium adjacent to the SI joint.   Acute or subacute mild inferior endplate compression fracture L5.   Diverticulosis.     Electronically Signed   By: Inge Rise M.D.   On: 12/16/2020 09:57   PATIENT SURVEYS:  FOTO 32(44 predicted); 01/17/23 44   SCREENING FOR RED FLAGS: Bowel or bladder incontinence: No   COGNITION: Overall cognitive status: Within functional limits for tasks assessed                          SENSATION: Not tested   MUSCLE LENGTH: Hamstrings: Right 90 deg; Left 90 deg     POSTURE: decreased lumbar lordosis and side bent R with R knee flexed  in valgus with pronated R ankle   PALPATION: NT   LUMBAR ROM:    AROM eval  Flexion 75%  Extension 25%  Right lateral flexion 25%  Left lateral flexion 25%  Right rotation 90%  Left rotation  75%   (Blank rows = not tested)   LOWER EXTREMITY ROM:   WFL   Active  Right eval Left eval  Hip flexion      Hip extension      Hip abduction      Hip adduction      Hip internal rotation      Hip external rotation      Knee flexion      Knee extension      Ankle dorsiflexion      Ankle plantarflexion      Ankle inversion      Ankle eversion       (Blank rows = not tested)   LOWER EXTREMITY MMT:  [facility]   MMT Right eval Left eval  Hip flexion      Hip extension      Hip  abduction      Hip adduction      Hip internal rotation      Hip external rotation      Knee flexion      Knee extension      Ankle dorsiflexion      Ankle plantarflexion      Ankle inversion      Ankle eversion      Core/trunk 3 3   (Blank rows = not tested)   LUMBAR SPECIAL TESTS:  Deferred due to fusion   FUNCTIONAL TESTS:  NT   GAIT: Distance walked: 42ft x2 Assistive device utilized: None Level of assistance: Complete Independence Comments: antalgic     HOME EXERCISE PROGRAM: Access Code: [email or web address] URL: [email or web address] Date: 12/07/2022 Prepared by: Sharlynn Oliphant   Exercises - Supine Posterior Pelvic Tilt  - 2 x daily - 5 x weekly - 1 sets - 10 reps - 3s hold - Supine 90/90 Alternating Heel Touches with Posterior Pelvic Tilt  - 2 x daily - 5 x weekly - 1 sets - 2 reps - 30s hold - Supine 90/90 Abdominal Bracing  - 2 x daily - 5 x weekly - 1 sets - 2 reps - 30s hold  [facility] Adult PT Treatment:                                                DATE: 01/20/23 Therapeutic Exercise: Nustep L2 6 min Supine hip fallouts GTB 15x B 15/15 unilaterally S/L L clamshell GTB 15x L hip flexor stretch with bolster 30s x2 90/90 30s x2 Manual Therapy: Skilled palpation to identify taught bands to L piriformis muscle in R S/L position Trigger Point Dry Needling Treatment: Pre-treatment instruction: Patient instructed on dry needling rationale, procedures, and possible side  effects including pain during treatment (achy,cramping feeling), bruising, drop of blood, lightheadedness, nausea, sweating. Patient Consent Given: Yes Education handout provided: Previously provided Muscles treated: L piriformis  Needle size and number: .30x89mm x 1 Electrical stimulation performed: No Parameters: N/A Treatment response/outcome: Twitch response elicited and Palpable decrease in muscle tension Post-treatment instructions: Patient instructed to expect possible mild to moderate muscle soreness later today and/or tomorrow. Patient instructed in methods to reduce muscle soreness and to continue prescribed HEP. If patient was dry needled over the lung field, patient was instructed on signs and symptoms of pneumothorax and, however unlikely, to see immediate medical attention should they occur. Patient was also educated on signs and symptoms of infection and to seek medical attention should they occur. Patient verbalized understanding of these instructions and education.   [facility] Adult PT Treatment:                                                DATE: 01/17/23 Therapeutic Exercise: Nustep L1 6 min Seated latissimus press 3s hold 15x Seated lat press with alternating LAQs 15/15 Seated lat press with alternating marches 10/10 Seated hip toss 0# 10/10 Seated V's 0# 10/10 Seated chops 0# 10/10 TTP at L piriformis   [facility] Adult PT Treatment:  DATE: 01/13/23 Aquatic therapy at [address] - therapeutic pool temp 92 degrees Pt enters building independently.  Treatment took place in water 3.8 to  4 ft 8 in.feet deep depending upon activity.  Pt entered and exited the pool via stair and handrails    Aquatic Therapy:  Water walking for warm up fwd/lat/bkwds  Standing: Walking march with rainbow DB alternating push/pull Holding onto barbell: 3 way hip x10 each BIL Leg extended hip flex/ext x20 BIL Hip abd/add x20 BIL Squats  x20 Heel raises - x20 Green noodle stomp x20 ea (CGA) Sitting on submerged bench: LAQ x1' Bicycle kick x1' Scissor kick x1' Kickboard push/pull x1' Kickboard push down x1' NOT TODAY Lunge fwd x20 Lunge lateral x20 Step up and overs on submerged step 2x10 BIL Kickboard push down - 10x Kickboard - push/pull - 20x  Standing march   Pt requires the buoyancy of water for active assisted exercises with buoyancy supported for strengthening and AROM exercises. Hydrostatic pressure also supports joints by unweighting joint load by at least 50 % in 3-4 feet depth water. 80% in chest to neck deep water. Water will provide assistance with movement using the current and laminar flow while the buoyancy reduces weight bearing. Pt requires the viscosity of the water for resistance with strengthening exercises.  [facility] Adult PT Treatment:                                                DATE: 01-11-23 Aquatic therapy at [address] - therapeutic pool temp 92 degrees Pt enters building independently.  Treatment took place in water 3.8 to  4 ft 8 in.feet deep depending upon activity.  Pt entered and exited the pool via stair and handrails   Pain at initiation of RX session 6/10  at end  3/10 Aquatic Therapy:  Water walking for warm up fwd/sideways/backward Ms [place] was educated on  beneficial therapeutic effects of water while ambulating to acclimate to water walking forward, backward and side stepping.  Pt educated on neutral posture and hip hinging in seated position with water at chest level x 10 with stretch to low back and then x 10 with back at pool wall at external cue, VC for neck tucked to prevent hyperextension.  Standing: Walking march with kickboard push down alternating Leg extended hip flex/ext x20 BIL  Hip abd/add x20 BIL Squats x20 Lunge to target x 15 Gentle trunk rotation with pool noodle Heel raises - x20 Hip CCW and CW x 10 BIL Runners Stretch x 30" x 2  BIL Hamstring stretch x 30" x 2 BIL Step ups on submerged step x10 BIL fwd/lat Sitting on submerged bench: LAQ x1' Bicycle kick x1' Scissor kick x1' [place], Pt with lumbar belt around hips and nek doodle for neck support.   Pt assisted into supine floating position by lying head on shoulder of PT to get into floating position. . PT at torso and assisting with trunk left to right and vice versa to engage trunk muscles. PT then rotated trunk in order to engage abdominal (internal and external obliques) Emphasis on breathing techniques to draw in abdominals for support.  Pt then utilizing posterior chain and engaging Hip extension and knee flexion with water resistance while PT used Aqua stretch techniques to decrease muscle tension in low back.   TREATMENT 01/06/23:  Aquatic therapy at [address] - therapeutic pool temp 92 degrees Pt enters building independently.  Treatment took place in water 3.8 to  4 ft 8 in.feet deep depending upon activity.  Pt entered and exited the pool via stair and handrails    Aquatic Therapy:  Water walking for warm up fwd/lat/bkwds  Standing: Walking march with rainbow DB alternating push/pull Leg extended hip flex/ext x20 BIL (LOB) Hip abd/add x20 BIL Squats x20 Heel raises - x20 Green noodle stomp x20 ea Step ups on submerged step x10 BIL fwd/lat Sitting on submerged bench: LAQ x1' Bicycle kick x1' Scissor kick x1'  NOT TODAY Lunge fwd x20 Lunge lateral x20 Step up and overs on submerged step 2x10 BIL Kickboard push down - 10x Kickboard - push/pull - 20x  Standing march   Pt requires the buoyancy of water for active assisted exercises with buoyancy supported for strengthening and AROM exercises. Hydrostatic pressure also supports joints by unweighting joint load by at least 50 % in 3-4 feet depth water. 80% in chest to neck deep water. Water will provide assistance with movement using the current and laminar flow while the  buoyancy reduces weight bearing. Pt requires the viscosity of the water for resistance with strengthening exercises.    ASSESSMENT:   CLINICAL IMPRESSION: Performed TPDN to L piriformis muscle as noted, 3 needle placements with decreased tension felt.  Returned to seated latissimus press which continued to exacerbate LLE radicular symptoms.  Followed up with piriformis stretches which did not reveal tightness.  Progressed to supine hip abduction strengthening tasks as well core exercises, isometric based     OBJECTIVE IMPAIRMENTS: Abnormal gait, decreased activity tolerance, decreased balance, decreased mobility, difficulty walking, decreased ROM, decreased strength, impaired perceived functional ability, impaired flexibility, improper body mechanics, postural dysfunction, and pain.    ACTIVITY LIMITATIONS: carrying, lifting, bending, standing, squatting, and stairs   PERSONAL FACTORS: Age, Fitness, Past/current experiences, Time since onset of injury/illness/exacerbation, and 1 comorbidity: RA  are also affecting patient's functional outcome.    REHAB POTENTIAL: Good   CLINICAL DECISION MAKING: Evolving/moderate complexity   EVALUATION COMPLEXITY: Moderate     GOALS: Goals reviewed with patient? Yes   SHORT TERM GOALS: Target date: 12/28/2022     Begin aquatic program. Baseline: TBD Goal status: MET Initiated 01/05/23   2.  Patient to demonstrate independence in HEP  Baseline: [id number] Goal status: Met 01/17/23 Reviewed with patient on 01/06/23     LONG TERM GOALS: Target date: 01/18/2023     Increase trunk and core strength to 4/5 Baseline: 3/5 Goal status: Progressing   2.  Increase FOTO score to 44 Baseline: 32; 01/17/23 44  Goal status: Met   3.  Patient to perform 5x STS with arms crossed in <20s Baseline: TBD Goal status: Ongoing   4.  Decrease worst pain to 6/10 Baseline: 8/10 worst pain Goal status: Ongoing     PLAN:   PT FREQUENCY: 2x/week   PT  DURATION: 6 weeks   PLANNED INTERVENTIONS: Therapeutic exercises, Therapeutic activity, Neuromuscular re-education, Balance training, Gait training, Patient/Family education, Self Care, Joint mobilization, Stair training, Aquatic Therapy, Dry Needling, Manual therapy, and Re-evaluation.   PLAN FOR NEXT SESSION: Initiate aquatic therapy, core and trunk strengthening, postural exercises, stretching tasks    Leroy Sea PT  01/20/23 1:00 PM

## 2023-01-24 ENCOUNTER — Ambulatory Visit: Payer: Medicare Other

## 2023-01-24 DIAGNOSIS — M5459 Other low back pain: Secondary | ICD-10-CM

## 2023-01-24 DIAGNOSIS — M4326 Fusion of spine, lumbar region: Secondary | ICD-10-CM

## 2023-01-24 DIAGNOSIS — M069 Rheumatoid arthritis, unspecified: Secondary | ICD-10-CM

## 2023-01-24 NOTE — Therapy (Signed)
OUTPATIENT PHYSICAL THERAPY TREATMENT NOTE   Patient Name: Damary Hounshell MRN: [id number] DOB:1946-06-23, 77 y.o., female Today's Date: 01/24/2023  PCP: Bernerd Limbo, MD    REFERRING PROVIDER: Juanda Chance    PT End of Session - 01/24/23 1217     Visit Number 8    Number of Visits 12    Date for PT Re-Evaluation 02/01/23    Authorization Type MCR    PT Start Time 1216    PT Stop Time 1300    PT Time Calculation (min) 44 min    Activity Tolerance Patient tolerated treatment well    Behavior During Therapy [facility] for tasks assessed/performed                Past Medical History:  Diagnosis Date   Anxiety    Arthritis    Blood transfusion without reported diagnosis 05/2019   with hip surgery   Fibromyalgia    Fractured pelvis ([place]) 09/15/2020   right   GERD (gastroesophageal reflux disease)    GI bleed [id number]   x2   Hyperlipidemia    Pneumonia 2005   Stomach ulcer 1990   Past Surgical History:  Procedure Laterality Date   ABDOMINAL HYSTERECTOMY     FRACTURE SURGERY Right 1998   ankle   JOINT REPLACEMENT     LUMBAR FUSION  2015   OPEN REDUCTION INTERNAL FIXATION (ORIF) PROXIMAL PHALANX Right 09/21/2020   Procedure: OPEN TREATMENT OF RIGHT LONG FINGER PROXIMAL PHALANX FRACTURE;  Surgeon: Milly Jakob, MD;  Location: [facility];  Service: Orthopedics;  Laterality: Right;  LENGTH OF SURGERY: 75 MIN   ORIF HUMERUS FRACTURE Left    ORIF TIBIA FRACTURE Right 1998   with bone graft   PYLOROPLASTY     REVERSE SHOULDER ARTHROPLASTY Left 02/04/2021   Procedure: REVERSE SHOULDER ARTHROPLASTY WITH HARDWARE REMOVAL;  Surgeon: Tania Ade, MD;  Location: [facility];  Service: Orthopedics;  Laterality: Left;   TOTAL HIP ARTHROPLASTY  2011   TOTAL HIP ARTHROPLASTY Right 05/14/2019   Procedure: Right Anterior Hip Arthroplasty;  Surgeon: Melrose Nakayama, MD;  Location: [facility];  Service: Orthopedics;  Laterality: Right;   TOTAL SHOULDER REPLACEMENT  2014   Patient  Active Problem List   Diagnosis Date Noted   Palpitations 09/01/2020   Chest pain 09/01/2020   Abnormal ECG 09/01/2020   Moderate episode of recurrent major depressive disorder ([place]) 06/22/2020   Prediabetes 06/22/2020   Mixed hyperlipidemia 06/22/2020   History of total right hip arthroplasty 05/17/2019   Anxiety    Primary osteoarthritis of right hip 05/14/2019   Vitamin D deficiency 01/10/2019   Insomnia due to anxiety and fear 10/25/2017   Chronic prescription benzodiazepine use 10/25/2017   Chronic bilateral low back pain without sciatica 04/27/2017   Osteoarthritis 01/06/2017   GAD (generalized anxiety disorder) 01/06/2017   Rheumatoid arthritis involving multiple sites with positive rheumatoid factor ([place]) 12/12/2016   Status post lumbar spinal fusion 12/12/2016   Fibromyalgia 12/12/2016    THERAPY DIAG:  Chronic low back pain   S/P lumbar fusion    Rationale for Evaluation and Treatment Rehabilitation  REFERRING DIAG: M05.79 (ICD-10-CM) - Rheumatoid arthritis with rheumatoid factor of multiple sites without organ or systems involvement M54.50 (ICD-10-CM) - Low back pain, unspecified G89.29 (ICD-10-CM) - Other chronic pain   PERTINENT HISTORY: Lumbar fusion and foraminotomy 10 years ago, B THA, hx of L TSA reverse   PRECAUTIONS/RESTRICTIONS:   lumbar fusion, RA  SUBJECTIVE: Reporting 8/10 pain when waking, diminishes  to 5/10 with activity.  Still not able to tolerate walking any length of time.    PAIN:  Are you having pain? Yes: NPRS scale: 0/10 Pain location: sacral region  Pain description: ache  Aggravating factors: activity Relieving factors: meds and thermal modalities  OBJECTIVE: (objective measures completed at initial evaluation unless otherwise dated)  DIAGNOSTIC FINDINGS:  CLINICAL DATA:  Sacral pain since the patient suffered a fall when she was pulled down by her dog 09/08/2020.   EXAM: MRI LUMBAR SPINE WITHOUT CONTRAST    TECHNIQUE: Multiplanar, multisequence MR imaging of the lumbar spine was performed. No intravenous contrast was administered.   COMPARISON:  CT of the right hip 10/20/2020. MRI of the right hip 02/01/2020.   FINDINGS: Bones/Joint/Cartilage   There is marrow edema throughout the left sacrum due to a mildly displaced fracture. A small focus of marrow edema is seen in the left ilium adjacent to the SI joint consistent with nondisplaced fracture. Also seen is a mild inferior endplate compression fracture of L5 eccentric to the right with vertebral body height loss of approximately 10% and associated marrow edema.   There is partial visualization of lumbar fusion hardware extending from L4 above the superior aspect of the scan. Artifact from bilateral hip replacements also noted. No worrisome lesion is identified.   Ligaments   Negative.   Muscles and Tendons   Intact.   Soft tissues   Sigmoid diverticulosis noted.   IMPRESSION: Acute or subacute minimally displaced fracture of the left sacrum. There is also a small fracture in the left ilium adjacent to the SI joint.   Acute or subacute mild inferior endplate compression fracture L5.   Diverticulosis.     Electronically Signed   By: Inge Rise M.D.   On: 12/16/2020 09:57   PATIENT SURVEYS:  FOTO 32(44 predicted); 01/17/23 44   SCREENING FOR RED FLAGS: Bowel or bladder incontinence: No   COGNITION: Overall cognitive status: Within functional limits for tasks assessed                          SENSATION: Not tested   MUSCLE LENGTH: Hamstrings: Right 90 deg; Left 90 deg     POSTURE: decreased lumbar lordosis and side bent R with R knee flexed  in valgus with pronated R ankle   PALPATION: NT   LUMBAR ROM:    AROM eval  Flexion 75%  Extension 25%  Right lateral flexion 25%  Left lateral flexion 25%  Right rotation 90%  Left rotation 75%   (Blank rows = not tested)   LOWER EXTREMITY ROM:    WFL   Active  Right eval Left eval  Hip flexion      Hip extension      Hip abduction      Hip adduction      Hip internal rotation      Hip external rotation      Knee flexion      Knee extension      Ankle dorsiflexion      Ankle plantarflexion      Ankle inversion      Ankle eversion       (Blank rows = not tested)   LOWER EXTREMITY MMT:  [facility]   MMT Right eval Left eval  Hip flexion      Hip extension      Hip abduction      Hip adduction      Hip  internal rotation      Hip external rotation      Knee flexion      Knee extension      Ankle dorsiflexion      Ankle plantarflexion      Ankle inversion      Ankle eversion      Core/trunk 3 3   (Blank rows = not tested)   LUMBAR SPECIAL TESTS:  Deferred due to fusion   FUNCTIONAL TESTS:  NT   GAIT: Distance walked: 58ft x2 Assistive device utilized: None Level of assistance: Complete Independence Comments: antalgic     HOME EXERCISE PROGRAM: Access Code: [email or web address] URL: [email or web address] Date: 12/07/2022 Prepared by: Sharlynn Oliphant   Exercises - Supine Posterior Pelvic Tilt  - 2 x daily - 5 x weekly - 1 sets - 10 reps - 3s hold - Supine 90/90 Alternating Heel Touches with Posterior Pelvic Tilt  - 2 x daily - 5 x weekly - 1 sets - 2 reps - 30s hold - Supine 90/90 Abdominal Bracing  - 2 x daily - 5 x weekly - 1 sets - 2 reps - 30s hold  [facility] Adult PT Treatment:                                                DATE: 01/24/23 Therapeutic Exercise: Nustep L2 6 min Supine hip fallouts BluTB 15x B 15/15 unilaterally S/L L clamshell 15x manual resistance L hip flexor stretch with bolster 30s x2 90/90 30s x2 2# Manual Therapy: Skilled palpation to identify taught bands to L TFL muscle in R S/L position Trigger Point Dry Needling Treatment: Pre-treatment instruction: Patient instructed on dry needling rationale, procedures, and possible side effects including pain during treatment (achy,cramping  feeling), bruising, drop of blood, lightheadedness, nausea, sweating. Patient Consent Given: Yes Education handout provided: Previously provided Muscles treated: L TFL  Needle size and number: .30x74mm x 1 Electrical stimulation performed: No Parameters: N/A Treatment response/outcome: Twitch response elicited, Palpable decrease in muscle tension, and decrease TTP Post-treatment instructions: Patient instructed to expect possible mild to moderate muscle soreness later today and/or tomorrow. Patient instructed in methods to reduce muscle soreness and to continue prescribed HEP. If patient was dry needled over the lung field, patient was instructed on signs and symptoms of pneumothorax and, however unlikely, to see immediate medical attention should they occur. Patient was also educated on signs and symptoms of infection and to seek medical attention should they occur. Patient verbalized understanding of these instructions and education.    [facility] Adult PT Treatment:                                                DATE: 01/20/23 Therapeutic Exercise: Nustep L2 6 min Supine hip fallouts GTB 15x B 15/15 unilaterally S/L L clamshell GTB 15x L hip flexor stretch with bolster 30s x2 90/90 30s x2 Manual Therapy: Skilled palpation to identify taught bands to L piriformis muscle in R S/L position Trigger Point Dry Needling Treatment: Pre-treatment instruction: Patient instructed on dry needling rationale, procedures, and possible side effects including pain during treatment (achy,cramping feeling), bruising, drop of blood, lightheadedness, nausea, sweating. Patient Consent Given: Yes Education handout provided: Previously provided Muscles treated: L piriformis  Needle size and number: .30x44mm x 1 Electrical stimulation performed: No Parameters: N/A Treatment response/outcome: Twitch response elicited and Palpable decrease in muscle tension Post-treatment instructions: Patient instructed to expect  possible mild to moderate muscle soreness later today and/or tomorrow. Patient instructed in methods to reduce muscle soreness and to continue prescribed HEP. If patient was dry needled over the lung field, patient was instructed on signs and symptoms of pneumothorax and, however unlikely, to see immediate medical attention should they occur. Patient was also educated on signs and symptoms of infection and to seek medical attention should they occur. Patient verbalized understanding of these instructions and education.   [facility] Adult PT Treatment:                                                DATE: 01/17/23 Therapeutic Exercise: Nustep L1 6 min Seated latissimus press 3s hold 15x Seated lat press with alternating LAQs 15/15 Seated lat press with alternating marches 10/10 Seated hip toss 0# 10/10 Seated V's 0# 10/10 Seated chops 0# 10/10 TTP at L piriformis   [facility] Adult PT Treatment:                                                DATE: 01/13/23 Aquatic therapy at [address] - therapeutic pool temp 92 degrees Pt enters building independently.  Treatment took place in water 3.8 to  4 ft 8 in.feet deep depending upon activity.  Pt entered and exited the pool via stair and handrails    Aquatic Therapy:  Water walking for warm up fwd/lat/bkwds  Standing: Walking march with rainbow DB alternating push/pull Holding onto barbell: 3 way hip x10 each BIL Leg extended hip flex/ext x20 BIL Hip abd/add x20 BIL Squats x20 Heel raises - x20 Green noodle stomp x20 ea (CGA) Sitting on submerged bench: LAQ x1' Bicycle kick x1' Scissor kick x1' Kickboard push/pull x1' Kickboard push down x1' NOT TODAY Lunge fwd x20 Lunge lateral x20 Step up and overs on submerged step 2x10 BIL Kickboard push down - 10x Kickboard - push/pull - 20x  Standing march   Pt requires the buoyancy of water for active assisted exercises with buoyancy supported for strengthening and AROM exercises.  Hydrostatic pressure also supports joints by unweighting joint load by at least 50 % in 3-4 feet depth water. 80% in chest to neck deep water. Water will provide assistance with movement using the current and laminar flow while the buoyancy reduces weight bearing. Pt requires the viscosity of the water for resistance with strengthening exercises.  [facility] Adult PT Treatment:                                                DATE: 01-11-23 Aquatic therapy at [address] - therapeutic pool temp 92 degrees Pt enters building independently.  Treatment took place in water 3.8 to  4 ft 8 in.feet deep depending upon activity.  Pt entered and exited the pool via stair and handrails   Pain at initiation of RX session 6/10  at end  3/10 Aquatic Therapy:  Water walking for warm up fwd/sideways/backward Ms Ray was educated on  beneficial therapeutic effects of water while ambulating to acclimate to water walking forward, backward and side stepping.  Pt educated on neutral posture and hip hinging in seated position with water at chest level x 10 with stretch to low back and then x 10 with back at pool wall at external cue, VC for neck tucked to prevent hyperextension.  Standing: Walking march with kickboard push down alternating Leg extended hip flex/ext x20 BIL  Hip abd/add x20 BIL Squats x20 Lunge to target x 15 Gentle trunk rotation with pool noodle Heel raises - x20 Hip CCW and CW x 10 BIL Runners Stretch x 30" x 2 BIL Hamstring stretch x 30" x 2 BIL Step ups on submerged step x10 BIL fwd/lat Sitting on submerged bench: LAQ x1' Bicycle kick x1' Scissor kick x1' [place], Pt with lumbar belt around hips and nek doodle for neck support.   Pt assisted into supine floating position by lying head on shoulder of PT to get into floating position. . PT at torso and assisting with trunk left to right and vice versa to engage trunk muscles. PT then rotated trunk in order to engage abdominal  (internal and external obliques) Emphasis on breathing techniques to draw in abdominals for support.  Pt then utilizing posterior chain and engaging Hip extension and knee flexion with water resistance while PT used Aqua stretch techniques to decrease muscle tension in low back.   TREATMENT 01/06/23: Aquatic therapy at [address] - therapeutic pool temp 92 degrees Pt enters building independently.  Treatment took place in water 3.8 to  4 ft 8 in.feet deep depending upon activity.  Pt entered and exited the pool via stair and handrails    Aquatic Therapy:  Water walking for warm up fwd/lat/bkwds  Standing: Walking march with rainbow DB alternating push/pull Leg extended hip flex/ext x20 BIL (LOB) Hip abd/add x20 BIL Squats x20 Heel raises - x20 Green noodle stomp x20 ea Step ups on submerged step x10 BIL fwd/lat Sitting on submerged bench: LAQ x1' Bicycle kick x1' Scissor kick x1'  NOT TODAY Lunge fwd x20 Lunge lateral x20 Step up and overs on submerged step 2x10 BIL Kickboard push down - 10x Kickboard - push/pull - 20x  Standing march   Pt requires the buoyancy of water for active assisted exercises with buoyancy supported for strengthening and AROM exercises. Hydrostatic pressure also supports joints by unweighting joint load by at least 50 % in 3-4 feet depth water. 80% in chest to neck deep water. Water will provide assistance with movement using the current and laminar flow while the buoyancy reduces weight bearing. Pt requires the viscosity of the water for resistance with strengthening exercises.    ASSESSMENT:   CLINICAL IMPRESSION: Minimal relief reported with TPDN to L piriformis last session.  Performed TPDN to L TFL following discovery of taught band. Twitch response felt but no marked relief of pain or discomfort reported.  Added resistance and difficulty as noted, manual resistance to clams and S/L abduction ensuring patient facilitated gluteus  medius.     OBJECTIVE IMPAIRMENTS: Abnormal gait, decreased activity tolerance, decreased balance, decreased mobility, difficulty walking, decreased ROM, decreased strength, impaired perceived functional ability, impaired flexibility, improper body mechanics, postural dysfunction, and pain.    ACTIVITY LIMITATIONS: carrying, lifting, bending, standing, squatting, and stairs   PERSONAL FACTORS: Age, Fitness, Past/current experiences, Time since onset of injury/illness/exacerbation, and 1 comorbidity: RA  are  also affecting patient's functional outcome.    REHAB POTENTIAL: Good   CLINICAL DECISION MAKING: Evolving/moderate complexity   EVALUATION COMPLEXITY: Moderate     GOALS: Goals reviewed with patient? Yes   SHORT TERM GOALS: Target date: 12/28/2022     Begin aquatic program. Baseline: TBD Goal status: MET Initiated 01/05/23   2.  Patient to demonstrate independence in HEP  Baseline: [id number] Goal status: Met 01/17/23 Reviewed with patient on 01/06/23     LONG TERM GOALS: Target date: 01/18/2023     Increase trunk and core strength to 4/5 Baseline: 3/5 Goal status: Progressing   2.  Increase FOTO score to 44 Baseline: 32; 01/17/23 44  Goal status: Met   3.  Patient to perform 5x STS with arms crossed in <20s Baseline: TBD Goal status: Ongoing   4.  Decrease worst pain to 6/10 Baseline: 8/10 worst pain Goal status: Ongoing     PLAN:   PT FREQUENCY: 2x/week   PT DURATION: 6 weeks   PLANNED INTERVENTIONS: Therapeutic exercises, Therapeutic activity, Neuromuscular re-education, Balance training, Gait training, Patient/Family education, Self Care, Joint mobilization, Stair training, Aquatic Therapy, Dry Needling, Manual therapy, and Re-evaluation.   PLAN FOR NEXT SESSION: Initiate aquatic therapy, core and trunk strengthening, postural exercises, stretching tasks    Leroy Sea PT  01/24/23 1:04 PM

## 2023-01-27 ENCOUNTER — Ambulatory Visit: Payer: Medicare Other

## 2023-01-30 NOTE — Therapy (Addendum)
OUTPATIENT PHYSICAL THERAPY TREATMENT NOTE/Re-Certification   Patient Name: Tanya Duncan MRN: [id number] DOB:1946-07-02, 77 y.o., female Today's Date: 01/31/2023  PCP: Tracey Harries, MD    REFERRING PROVIDER: Barbarann Ehlers    PT End of Session - 01/31/23 1649     Visit Number 9    Number of Visits 12    Date for PT Re-Evaluation 02/01/23    Authorization Type MCR    PT Start Time 1650    PT Stop Time 1730    PT Time Calculation (min) 40 min    Activity Tolerance Patient tolerated treatment well    Behavior During Therapy [facility] for tasks assessed/performed                 Past Medical History:  Diagnosis Date   Anxiety    Arthritis    Blood transfusion without reported diagnosis 05/2019   with hip surgery   Fibromyalgia    Fractured pelvis (HCC) 09/15/2020   right   GERD (gastroesophageal reflux disease)    GI bleed 4098,1191   x2   Hyperlipidemia    Pneumonia 2005   Stomach ulcer 1990   Past Surgical History:  Procedure Laterality Date   ABDOMINAL HYSTERECTOMY     FRACTURE SURGERY Right 1998   ankle   JOINT REPLACEMENT     LUMBAR FUSION  2015   OPEN REDUCTION INTERNAL FIXATION (ORIF) PROXIMAL PHALANX Right 09/21/2020   Procedure: OPEN TREATMENT OF RIGHT LONG FINGER PROXIMAL PHALANX FRACTURE;  Surgeon: Mack Hook, MD;  Location: [facility] OR;  Service: Orthopedics;  Laterality: Right;  LENGTH OF SURGERY: 75 MIN   ORIF HUMERUS FRACTURE Left    ORIF TIBIA FRACTURE Right 1998   with bone graft   PYLOROPLASTY     REVERSE SHOULDER ARTHROPLASTY Left 02/04/2021   Procedure: REVERSE SHOULDER ARTHROPLASTY WITH HARDWARE REMOVAL;  Surgeon: Jones Broom, MD;  Location: [facility] ORS;  Service: Orthopedics;  Laterality: Left;   TOTAL HIP ARTHROPLASTY  2011   TOTAL HIP ARTHROPLASTY Right 05/14/2019   Procedure: Right Anterior Hip Arthroplasty;  Surgeon: Marcene Corning, MD;  Location: [facility] ORS;  Service: Orthopedics;  Laterality: Right;   TOTAL SHOULDER REPLACEMENT   2014   Patient Active Problem List   Diagnosis Date Noted   Palpitations 09/01/2020   Chest pain 09/01/2020   Abnormal ECG 09/01/2020   Moderate episode of recurrent major depressive disorder (HCC) 06/22/2020   Prediabetes 06/22/2020   Mixed hyperlipidemia 06/22/2020   History of total right hip arthroplasty 05/17/2019   Anxiety    Primary osteoarthritis of right hip 05/14/2019   Vitamin D deficiency 01/10/2019   Insomnia due to anxiety and fear 10/25/2017   Chronic prescription benzodiazepine use 10/25/2017   Chronic bilateral low back pain without sciatica 04/27/2017   Osteoarthritis 01/06/2017   GAD (generalized anxiety disorder) 01/06/2017   Rheumatoid arthritis involving multiple sites with positive rheumatoid factor (HCC) 12/12/2016   Status post lumbar spinal fusion 12/12/2016   Fibromyalgia 12/12/2016    THERAPY DIAG:  Chronic low back pain   S/P lumbar fusion    Rationale for Evaluation and Treatment Rehabilitation  REFERRING DIAG: M05.79 (ICD-10-CM) - Rheumatoid arthritis with rheumatoid factor of multiple sites without organ or systems involvement M54.50 (ICD-10-CM) - Low back pain, unspecified G89.29 (ICD-10-CM) - Other chronic pain   PERTINENT HISTORY: Lumbar fusion and foraminotomy 10 years ago, B THA, hx of L TSA reverse   PRECAUTIONS/RESTRICTIONS:   lumbar fusion, RA  SUBJECTIVE: Symptomatic due to upcoming rain.  TPDN not beneficial to date, feels core work has been helpful and wishes to continue with strengthening   PAIN:  Are you having pain? Yes: NPRS scale: 0/10 Pain location: sacral region  Pain description: ache  Aggravating factors: activity Relieving factors: meds and thermal modalities  OBJECTIVE: (objective measures completed at initial evaluation unless otherwise dated)  DIAGNOSTIC FINDINGS:  CLINICAL DATA:  Sacral pain since the patient suffered a fall when she was pulled down by her dog 09/08/2020.   EXAM: MRI LUMBAR SPINE WITHOUT  CONTRAST   TECHNIQUE: Multiplanar, multisequence MR imaging of the lumbar spine was performed. No intravenous contrast was administered.   COMPARISON:  CT of the right hip 10/20/2020. MRI of the right hip 02/01/2020.   FINDINGS: Bones/Joint/Cartilage   There is marrow edema throughout the left sacrum due to a mildly displaced fracture. A small focus of marrow edema is seen in the left ilium adjacent to the SI joint consistent with nondisplaced fracture. Also seen is a mild inferior endplate compression fracture of L5 eccentric to the right with vertebral body height loss of approximately 10% and associated marrow edema.   There is partial visualization of lumbar fusion hardware extending from L4 above the superior aspect of the scan. Artifact from bilateral hip replacements also noted. No worrisome lesion is identified.   Ligaments   Negative.   Muscles and Tendons   Intact.   Soft tissues   Sigmoid diverticulosis noted.   IMPRESSION: Acute or subacute minimally displaced fracture of the left sacrum. There is also a small fracture in the left ilium adjacent to the SI joint.   Acute or subacute mild inferior endplate compression fracture L5.   Diverticulosis.     Electronically Signed   By: Drusilla Kanner M.D.   On: 12/16/2020 09:57   PATIENT SURVEYS:  FOTO 32(44 predicted); 01/17/23 44   SCREENING FOR RED FLAGS: Bowel or bladder incontinence: No   COGNITION: Overall cognitive status: Within functional limits for tasks assessed                          SENSATION: Not tested   MUSCLE LENGTH: Hamstrings: Right 90 deg; Left 90 deg     POSTURE: decreased lumbar lordosis and side bent R with R knee flexed  in valgus with pronated R ankle   PALPATION: NT   LUMBAR ROM:    AROM eval  Flexion 75%  Extension 25%  Right lateral flexion 25%  Left lateral flexion 25%  Right rotation 90%  Left rotation 75%   (Blank rows = not tested)   LOWER  EXTREMITY ROM:   WFL   Active  Right eval Left eval  Hip flexion      Hip extension      Hip abduction      Hip adduction      Hip internal rotation      Hip external rotation      Knee flexion      Knee extension      Ankle dorsiflexion      Ankle plantarflexion      Ankle inversion      Ankle eversion       (Blank rows = not tested)   LOWER EXTREMITY MMT:  [facility]   MMT Right eval Left eval  Hip flexion      Hip extension      Hip abduction      Hip adduction  Hip internal rotation      Hip external rotation      Knee flexion      Knee extension      Ankle dorsiflexion      Ankle plantarflexion      Ankle inversion      Ankle eversion      Core/trunk 3 3   (Blank rows = not tested)   LUMBAR SPECIAL TESTS:  Deferred due to fusion   FUNCTIONAL TESTS:  NT   GAIT: Distance walked: 59ft x2 Assistive device utilized: None Level of assistance: Complete Independence Comments: antalgic     HOME EXERCISE PROGRAM: Access Code: [email or web address] URL: [email or web address] Date: 01/31/2023 Prepared by: Gustavus Bryant  Exercises - Supine Posterior Pelvic Tilt  - 2 x daily - 5 x weekly - 1 sets - 10 reps - 3s hold - Supine 90/90 Alternating Heel Touches with Posterior Pelvic Tilt  - 2 x daily - 5 x weekly - 1 sets - 2 reps - 30s hold - Supine 90/90 Abdominal Bracing  - 2 x daily - 5 x weekly - 1 sets - 2 reps - 30s hold - Hooklying Single Leg Bent Knee Fallouts with Resistance  - 2 x daily - 5 x weekly - 3 sets - 15 reps - Clamshell with Resistance  - 1 x daily - 5 x weekly - 2 sets - 15 reps  [facility] Adult PT Treatment:                                                DATE: 01/31/23 Therapeutic Exercise: Nustep L4 8 min Seated latissimus press 3s 15x  Seated lat press with FAQs 15/15 Seated lat press with alt. marching 15/15 L hip flexor stretch 30s x3 Supine heel taps 30s x2 R S/L clams  Supine hip fallouts RTB 15x B, 15/15 unilaterally   [facility] Adult PT  Treatment:                                                DATE: 01/24/23 Therapeutic Exercise: Nustep L2 6 min Supine hip fallouts BluTB 15x B 15/15 unilaterally S/L L clamshell 15x manual resistance L hip flexor stretch with bolster 30s x2 90/90 30s x2 2# Manual Therapy: Skilled palpation to identify taught bands to L TFL muscle in R S/L position Trigger Point Dry Needling Treatment: Pre-treatment instruction: Patient instructed on dry needling rationale, procedures, and possible side effects including pain during treatment (achy,cramping feeling), bruising, drop of blood, lightheadedness, nausea, sweating. Patient Consent Given: Yes Education handout provided: Previously provided Muscles treated: L TFL  Needle size and number: .30x30mm x 1 Electrical stimulation performed: No Parameters: N/A Treatment response/outcome: Twitch response elicited, Palpable decrease in muscle tension, and decrease TTP Post-treatment instructions: Patient instructed to expect possible mild to moderate muscle soreness later today and/or tomorrow. Patient instructed in methods to reduce muscle soreness and to continue prescribed HEP. If patient was dry needled over the lung field, patient was instructed on signs and symptoms of pneumothorax and, however unlikely, to see immediate medical attention should they occur. Patient was also educated on signs and symptoms of infection and to seek medical attention should they occur. Patient verbalized understanding of these instructions and education.  [facility] Adult PT Treatment:                                                DATE: 01/20/23 Therapeutic Exercise: Nustep L2 6 min Supine hip fallouts GTB 15x B 15/15 unilaterally S/L L clamshell GTB 15x L hip flexor stretch with bolster 30s x2 90/90 30s x2 Manual Therapy: Skilled palpation to identify taught bands to L piriformis muscle in R S/L position Trigger Point Dry Needling Treatment: Pre-treatment instruction:  Patient instructed on dry needling rationale, procedures, and possible side effects including pain during treatment (achy,cramping feeling), bruising, drop of blood, lightheadedness, nausea, sweating. Patient Consent Given: Yes Education handout provided: Previously provided Muscles treated: L piriformis  Needle size and number: .30x4150mm x 1 Electrical stimulation performed: No Parameters: N/A Treatment response/outcome: Twitch response elicited and Palpable decrease in muscle tension Post-treatment instructions: Patient instructed to expect possible mild to moderate muscle soreness later today and/or tomorrow. Patient instructed in methods to reduce muscle soreness and to continue prescribed HEP. If patient was dry needled over the lung field, patient was instructed on signs and symptoms of pneumothorax and, however unlikely, to see immediate medical attention should they occur. Patient was also educated on signs and symptoms of infection and to seek medical attention should they occur. Patient verbalized understanding of these instructions and education.   [facility]                                                DATE: 01/17/23 Therapeutic Exercise: Nustep L1 6 min Seated latissimus press 3s hold 15x Seated lat press with alternating LAQs 15/15 Seated lat press with alternating marches 10/10 Seated hip toss 0# 10/10 Seated V's 0# 10/10 Seated chops 0# 10/10 TTP at L piriformis   [facility] Adult PT Treatment:                                                DATE: 01/13/23 Aquatic therapy at [facility] [address] - therapeutic pool temp 92 degrees Pt enters building independently.  Treatment took place in water 3.8 to  4 ft 8 in.feet deep depending upon activity.  Pt entered and exited the pool via stair and handrails    Aquatic Therapy:  Water walking for warm up fwd/lat/bkwds  Standing: Walking march with rainbow DB alternating push/pull Holding onto barbell: 3 way hip  x10 each BIL Leg extended hip flex/ext x20 BIL Hip abd/add x20 BIL Squats x20 Heel raises - x20 Green noodle stomp x20 ea (CGA) Sitting on submerged bench: LAQ x1' Bicycle kick x1' Scissor kick x1' Kickboard push/pull x1' Kickboard push down x1' NOT TODAY Lunge fwd x20 Lunge lateral x20 Step up and overs on submerged step 2x10 BIL Kickboard push down - 10x Kickboard - push/pull - 20x  Standing march   Pt requires the buoyancy of water for active assisted exercises with buoyancy supported for strengthening and AROM exercises. Hydrostatic pressure also supports joints by unweighting joint load by at least 50 % in 3-4 feet depth water. 80% in chest to neck deep water. Water  will provide assistance with movement using the current and laminar flow while the buoyancy reduces weight bearing. Pt requires the viscosity of the water for resistance with strengthening exercises.  [facility] Adult PT Treatment:                                                DATE: 01-11-23 Aquatic therapy at [facility] [address] - therapeutic pool temp 92 degrees Pt enters building independently.  Treatment took place in water 3.8 to  4 ft 8 in.feet deep depending upon activity.  Pt entered and exited the pool via stair and handrails   Pain at initiation of RX session 6/10  at end  3/10 Aquatic Therapy:  Water walking for warm up fwd/sideways/backward Ms Mcelhiney was educated on  beneficial therapeutic effects of water while ambulating to acclimate to water walking forward, backward and side stepping.  Pt educated on neutral posture and hip hinging in seated position with water at chest level x 10 with stretch to low back and then x 10 with back at pool wall at external cue, VC for neck tucked to prevent hyperextension.  Standing: Walking march with kickboard push down alternating Leg extended hip flex/ext x20 BIL  Hip abd/add x20 BIL Squats x20 Lunge to target x 15 Gentle trunk rotation with pool  noodle Heel raises - x20 Hip CCW and CW x 10 BIL Runners Stretch x 30" x 2 BIL Hamstring stretch x 30" x 2 BIL Step ups on submerged step x10 BIL fwd/lat Sitting on submerged bench: LAQ x1' Bicycle kick x1' Scissor kick x1' [place], Pt with lumbar belt around hips and nek doodle for neck support.   Pt assisted into supine floating position by lying head on shoulder of PT to get into floating position. . PT at torso and assisting with trunk left to right and vice versa to engage trunk muscles. PT then rotated trunk in order to engage abdominal (internal and external obliques) Emphasis on breathing techniques to draw in abdominals for support.  Pt then utilizing posterior chain and engaging Hip extension and knee flexion with water resistance while PT used Aqua stretch techniques to decrease muscle tension in low back.   TREATMENT 01/06/23: Aquatic therapy at [facility] [address] - therapeutic pool temp 92 degrees Pt enters building independently.  Treatment took place in water 3.8 to  4 ft 8 in.feet deep depending upon activity.  Pt entered and exited the pool via stair and handrails    Aquatic Therapy:  Water walking for warm up fwd/lat/bkwds  Standing: Walking march with rainbow DB alternating push/pull Leg extended hip flex/ext x20 BIL (LOB) Hip abd/add x20 BIL Squats x20 Heel raises - x20 Green noodle stomp x20 ea Step ups on submerged step x10 BIL fwd/lat Sitting on submerged bench: LAQ x1' Bicycle kick x1' Scissor kick x1'  NOT TODAY Lunge fwd x20 Lunge lateral x20 Step up and overs on submerged step 2x10 BIL Kickboard push down - 10x Kickboard - push/pull - 20x  Standing march   Pt requires the buoyancy of water for active assisted exercises with buoyancy supported for strengthening and AROM exercises. Hydrostatic pressure also supports joints by unweighting joint load by at least 50 % in 3-4 feet depth water. 80% in chest to neck deep water. Water will  provide assistance with movement using the current and  laminar flow while the buoyancy reduces weight bearing. Pt requires the viscosity of the water for resistance with strengthening exercises.    ASSESSMENT:   CLINICAL IMPRESSION: Focus of today's session was establishing tolerance to core strengthening exercises.  Added supine and S/L tasks against t-band resistance.  Still unable to tolerate seated core exercises due to exacerbation of hip pain.  Patient progressing towards goals and is on track to transition to a HEP at the end of her scheduled visits. Unfortunately,  TPDN did not offer much relief and land based program is difficult to tolerate.     OBJECTIVE IMPAIRMENTS: Abnormal gait, decreased activity tolerance, decreased balance, decreased mobility, difficulty walking, decreased ROM, decreased strength, impaired perceived functional ability, impaired flexibility, improper body mechanics, postural dysfunction, and pain.    ACTIVITY LIMITATIONS: carrying, lifting, bending, standing, squatting, and stairs   PERSONAL FACTORS: Age, Fitness, Past/current experiences, Time since onset of injury/illness/exacerbation, and 1 comorbidity: RA  are also affecting patient's functional outcome.    REHAB POTENTIAL: Good   CLINICAL DECISION MAKING: Evolving/moderate complexity   EVALUATION COMPLEXITY: Moderate     GOALS: Goals reviewed with patient? Yes   SHORT TERM GOALS: Target date: 12/28/2022     Begin aquatic program. Baseline: TBD Goal status: MET Initiated 01/05/23   2.  Patient to demonstrate independence in HEP  Baseline: [id number] Goal status: Met 01/17/23 Reviewed with patient on 01/06/23     LONG TERM GOALS: Target date: 01/18/2023     Increase trunk and core strength to 4/5 Baseline: 3/5 Goal status: Progressing   2.  Increase FOTO score to 44 Baseline: 32; 01/17/23 44  Goal status: Met   3.  Patient to perform 5x STS with arms crossed in <20s Baseline: TBD Goal  status: Ongoing   4.  Decrease worst pain to 6/10 Baseline: 8/10 worst pain Goal status: Ongoing     PLAN:   PT FREQUENCY: 2x/week   PT DURATION: 3 weeks   PLANNED INTERVENTIONS: Therapeutic exercises, Therapeutic activity, Neuromuscular re-education, Balance training, Gait training, Patient/Family education, Self Care, Joint mobilization, Stair training, Aquatic Therapy, Dry Needling, Manual therapy, and Re-evaluation.   PLAN FOR NEXT SESSION: Initiate aquatic therapy, core and trunk strengthening, postural exercises, stretching tasks    Learta Codding PT  01/31/23 5:41 PM

## 2023-01-31 ENCOUNTER — Ambulatory Visit: Payer: Medicare Other

## 2023-01-31 DIAGNOSIS — M4326 Fusion of spine, lumbar region: Secondary | ICD-10-CM

## 2023-01-31 DIAGNOSIS — M5459 Other low back pain: Secondary | ICD-10-CM | POA: Diagnosis not present

## 2023-01-31 DIAGNOSIS — M069 Rheumatoid arthritis, unspecified: Secondary | ICD-10-CM

## 2023-02-06 ENCOUNTER — Encounter: Payer: Self-pay | Admitting: Gastroenterology

## 2023-02-06 ENCOUNTER — Ambulatory Visit (INDEPENDENT_AMBULATORY_CARE_PROVIDER_SITE_OTHER): Payer: Medicare Other | Admitting: Gastroenterology

## 2023-02-06 VITALS — BP 108/64 | HR 76 | Ht 61.0 in | Wt 145.4 lb

## 2023-02-06 DIAGNOSIS — R159 Full incontinence of feces: Secondary | ICD-10-CM

## 2023-02-06 DIAGNOSIS — K2100 Gastro-esophageal reflux disease with esophagitis, without bleeding: Secondary | ICD-10-CM

## 2023-02-06 DIAGNOSIS — Z800 Family history of malignant neoplasm of digestive organs: Secondary | ICD-10-CM

## 2023-02-06 NOTE — Progress Notes (Signed)
    Assessment     GERD with LA Grade B esophagitis Suspected gastroparesis, after discussion will defer GES for now Fecal incontinence  Family history of colon cancer, first-degree relative   Recommendations    Continue pantoprazole 40 mg twice daily, famotidine 40 mg twice daily and follow antireflux measures Begin gastroparesis diet Kegel exercises 5 times daily long term DC fiber and if fecal incontinence persists then begin Miralax daily titrated to achieve 1-2 complete BMs/day Colonoscopy recommended in July 2024   HPI    This is a 77 year old female with GERD, LA grade B esophagitis, suspected gastroparesis.  She relates her reflux symptoms are under much better control.  She does note regurgitation when bending over.  She also notes regurgitation when she has evening medications and water to close to her bedtime.  She notes worsening problems with fecal leakage.  Her stools tend to be soft.  She started a fiber supplement which reduced her leakage however this has worsened gradually over time.  EGD Dec 2023   Colonoscopy July 2019    Labs / Imaging       Latest Ref Rng & Units 01/29/2021    3:20 PM 05/17/2019    7:18 PM  Hepatic Function  Total Protein 6.5 - 8.1 g/dL 7.1  5.2   Albumin 3.5 - 5.0 g/dL 4.4  2.8   AST 15 - 41 U/L 28  23   ALT 0 - 44 U/L 28  22   Alk Phosphatase 38 - 126 U/L 76  90   Total Bilirubin 0.3 - 1.2 mg/dL 0.6  0.8        Latest Ref Rng & Units 01/29/2021    3:20 PM 09/21/2020    7:18 AM 05/18/2019    5:52 AM  CBC  WBC 4.0 - 10.5 K/uL 6.4  6.0  5.6   Hemoglobin 12.0 - 15.0 g/dL 12.7  11.5  8.8   Hematocrit 36.0 - 46.0 % 39.9  36.1  27.0   Platelets 150 - 400 K/uL 232  203  211    Current Medications, Allergies, Past Medical History, Past Surgical History, Family History and Social History were reviewed in [employer] record.   Physical Exam: General: Well developed, well nourished, no acute distress Head:  Normocephalic and atraumatic Eyes: Sclerae anicteric, EOMI Ears: Normal auditory acuity Mouth: No deformities or lesions noted Lungs: Clear throughout to auscultation Heart: Regular rate and rhythm; No murmurs, rubs or bruits Abdomen: Soft, non tender and non distended. No masses, hepatosplenomegaly or hernias noted. Normal Bowel sounds Rectal: Not done Musculoskeletal: Symmetrical with no gross deformities  Pulses:  Normal pulses noted Extremities: No edema or deformities noted Neurological: Alert oriented x 4, grossly nonfocal Psychological:  Alert and cooperative. Normal mood and affect   Shirleymae Hauth T. Fuller Plan, MD 02/06/2023, 10:53 AM

## 2023-02-06 NOTE — Patient Instructions (Addendum)
Please follow antireflux measure handout given to you at your visit today.  Please look over and follow gastroparesis information given to you today.  Complete Kegel exercises at least 5 times daily long term (see instructions attached).  Discontinue fiber supplements to see if this helps with fecal leakage. If this does not help, you may purchase Miralax over the counter and titrate as needed until you have 1-2 soft bowel movements daily.  Follow up with Dr Fuller Plan in 1 year.  _______________________________________________________  If your blood pressure at your visit was 140/90 or greater, please contact your primary care physician to follow up on this.  _______________________________________________________  If you are age 74 or older, your body mass index should be between 23-30. Your Body mass index is 27.47 kg/m. If this is out of the aforementioned range listed, please consider follow up with your Primary Care Provider.  If you are age 49 or younger, your body mass index should be between 19-25. Your Body mass index is 27.47 kg/m. If this is out of the aformentioned range listed, please consider follow up with your Primary Care Provider.   ________________________________________________________  The [place] [facility] providers would like to encourage you to use [facility] to communicate with providers for non-urgent requests or questions.  Due to long hold times on the telephone, sending your provider a message by [facility] may be a faster and more efficient way to get a response.  Please allow 48 business hours for a response.  Please remember that this is for non-urgent requests.  _______________________________________________________  Due to recent changes in healthcare laws, you may see the results of your imaging and laboratory studies on MyChart before your provider has had a chance to review them.  We understand that in some cases there may be results that are confusing or concerning  to you. Not all laboratory results come back in the same time frame and the provider may be waiting for multiple results in order to interpret others.  Please give [place] 48 hours in order for your provider to thoroughly review all the results before contacting the office for clarification of your results.

## 2023-02-08 ENCOUNTER — Other Ambulatory Visit: Payer: Self-pay | Admitting: Family Medicine

## 2023-02-08 DIAGNOSIS — E2839 Other primary ovarian failure: Secondary | ICD-10-CM

## 2023-02-10 ENCOUNTER — Ambulatory Visit (HOSPITAL_BASED_OUTPATIENT_CLINIC_OR_DEPARTMENT_OTHER): Payer: Medicare Other | Admitting: Physical Therapy

## 2023-02-10 ENCOUNTER — Encounter (HOSPITAL_BASED_OUTPATIENT_CLINIC_OR_DEPARTMENT_OTHER): Payer: Self-pay

## 2023-02-10 NOTE — Addendum Note (Signed)
Addended by: Hildred Laser on: 02/10/2023 02:21 PM   Modules accepted: Orders

## 2023-02-14 ENCOUNTER — Encounter (HOSPITAL_BASED_OUTPATIENT_CLINIC_OR_DEPARTMENT_OTHER): Payer: Self-pay | Admitting: Physical Therapy

## 2023-02-14 ENCOUNTER — Ambulatory Visit (HOSPITAL_BASED_OUTPATIENT_CLINIC_OR_DEPARTMENT_OTHER): Payer: Medicare Other | Attending: Physician Assistant | Admitting: Physical Therapy

## 2023-02-14 DIAGNOSIS — M069 Rheumatoid arthritis, unspecified: Secondary | ICD-10-CM | POA: Diagnosis present

## 2023-02-14 DIAGNOSIS — M4326 Fusion of spine, lumbar region: Secondary | ICD-10-CM | POA: Diagnosis present

## 2023-02-14 DIAGNOSIS — M5459 Other low back pain: Secondary | ICD-10-CM | POA: Diagnosis not present

## 2023-02-14 NOTE — Therapy (Signed)
OUTPATIENT PHYSICAL THERAPY TREATMENT NOTE   Patient Name: Tanya Duncan MRN: [id number] DOB:May 06, 1946, 77 y.o., female Today's Date: 02/14/2023  PCP: Tracey Harries, MD    REFERRING PROVIDER: Jordan Hawks, PA-C    PT End of Session - 02/14/23 1206     Visit Number 10   PT completed recert last visit.   Number of Visits 16    Date for PT Re-Evaluation 04/07/23    Authorization Type MCR    PT Start Time 1200    PT Stop Time 1240    PT Time Calculation (min) 40 min    Activity Tolerance Patient tolerated treatment well    Behavior During Therapy [facility] for tasks assessed/performed                 Past Medical History:  Diagnosis Date   Anxiety    Arthritis    Blood transfusion without reported diagnosis 05/2019   with hip surgery   Fibromyalgia    Fractured pelvis 09/15/2020   right   GERD (gastroesophageal reflux disease)    GI bleed 8144,8185   x2   Hyperlipidemia    Pneumonia 2005   Stomach ulcer 1990   Past Surgical History:  Procedure Laterality Date   ABDOMINAL HYSTERECTOMY     FRACTURE SURGERY Right 1998   ankle   JOINT REPLACEMENT     LUMBAR FUSION  2015   OPEN REDUCTION INTERNAL FIXATION (ORIF) PROXIMAL PHALANX Right 09/21/2020   Procedure: OPEN TREATMENT OF RIGHT LONG FINGER PROXIMAL PHALANX FRACTURE;  Surgeon: Mack Hook, MD;  Location: [facility] OR;  Service: Orthopedics;  Laterality: Right;  LENGTH OF SURGERY: 75 MIN   ORIF HUMERUS FRACTURE Left    ORIF TIBIA FRACTURE Right 1998   with bone graft   PYLOROPLASTY     REVERSE SHOULDER ARTHROPLASTY Left 02/04/2021   Procedure: REVERSE SHOULDER ARTHROPLASTY WITH HARDWARE REMOVAL;  Surgeon: Jones Broom, MD;  Location: [facility];  Service: Orthopedics;  Laterality: Left;   TOTAL HIP ARTHROPLASTY  2011   TOTAL HIP ARTHROPLASTY Right 05/14/2019   Procedure: Right Anterior Hip Arthroplasty;  Surgeon: Marcene Corning, MD;  Location: [facility];  Service: Orthopedics;  Laterality: Right;   TOTAL SHOULDER  REPLACEMENT  2014   Patient Active Problem List   Diagnosis Date Noted   Palpitations 09/01/2020   Chest pain 09/01/2020   Abnormal ECG 09/01/2020   Moderate episode of recurrent major depressive disorder 06/22/2020   Prediabetes 06/22/2020   Mixed hyperlipidemia 06/22/2020   History of total right hip arthroplasty 05/17/2019   Anxiety    Primary osteoarthritis of right hip 05/14/2019   Vitamin D deficiency 01/10/2019   Insomnia due to anxiety and fear 10/25/2017   Chronic prescription benzodiazepine use 10/25/2017   Chronic bilateral low back pain without sciatica 04/27/2017   Osteoarthritis 01/06/2017   GAD (generalized anxiety disorder) 01/06/2017   Rheumatoid arthritis involving multiple sites with positive rheumatoid factor 12/12/2016   Status post lumbar spinal fusion 12/12/2016   Fibromyalgia 12/12/2016    THERAPY DIAG:  Chronic low back pain   S/P lumbar fusion    Rationale for Evaluation and Treatment Rehabilitation  REFERRING DIAG: M05.79 (ICD-10-CM) - Rheumatoid arthritis with rheumatoid factor of multiple sites without organ or systems involvement M54.50 (ICD-10-CM) - Low back pain, unspecified G89.29 (ICD-10-CM) - Other chronic pain   PERTINENT HISTORY: Lumbar fusion and foraminotomy 10 years ago, B THA, hx of L TSA reverse   PRECAUTIONS/RESTRICTIONS:   lumbar fusion, RA  SUBJECTIVE: Pt reports  she had to medicate for rheumatoid flare prior to last scheduled appt and this upset her stomach.    When asked about recent GI doctor visit, pt reports she does not have any issues with fecal incontinence right now, "that's passed".     PAIN:  Are you having pain? Yes: NPRS scale: 2/10 Pain location: sacral region  Pain description: ache  Aggravating factors: activity Relieving factors: meds and thermal modalities  OBJECTIVE: (objective measures completed at initial evaluation unless otherwise dated)  DIAGNOSTIC FINDINGS:  CLINICAL DATA:  Sacral pain since  the patient suffered a fall when she was pulled down by her dog 09/08/2020.   EXAM: MRI LUMBAR SPINE WITHOUT CONTRAST   TECHNIQUE: Multiplanar, multisequence MR imaging of the lumbar spine was performed. No intravenous contrast was administered.   COMPARISON:  CT of the right hip 10/20/2020. MRI of the right hip 02/01/2020.   FINDINGS: Bones/Joint/Cartilage   There is marrow edema throughout the left sacrum due to a mildly displaced fracture. A small focus of marrow edema is seen in the left ilium adjacent to the SI joint consistent with nondisplaced fracture. Also seen is a mild inferior endplate compression fracture of L5 eccentric to the right with vertebral body height loss of approximately 10% and associated marrow edema.   There is partial visualization of lumbar fusion hardware extending from L4 above the superior aspect of the scan. Artifact from bilateral hip replacements also noted. No worrisome lesion is identified.   Ligaments   Negative.   Muscles and Tendons   Intact.   Soft tissues   Sigmoid diverticulosis noted.   IMPRESSION: Acute or subacute minimally displaced fracture of the left sacrum. There is also a small fracture in the left ilium adjacent to the SI joint.   Acute or subacute mild inferior endplate compression fracture L5.   Diverticulosis.     Electronically Signed   By: Drusilla Kanner M.D.   On: 12/16/2020 09:57   PATIENT SURVEYS:  FOTO 32(44 predicted); 01/17/23 44   SCREENING FOR RED FLAGS: Bowel or bladder incontinence: No   COGNITION: Overall cognitive status: Within functional limits for tasks assessed                          SENSATION: Not tested   MUSCLE LENGTH: Hamstrings: Right 90 deg; Left 90 deg     POSTURE: decreased lumbar lordosis and side bent R with R knee flexed  in valgus with pronated R ankle   PALPATION: NT   LUMBAR ROM:    AROM eval  Flexion 75%  Extension 25%  Right lateral flexion 25%   Left lateral flexion 25%  Right rotation 90%  Left rotation 75%   (Blank rows = not tested)   LOWER EXTREMITY ROM:   WFL   Active  Right eval Left eval  Hip flexion      Hip extension      Hip abduction      Hip adduction      Hip internal rotation      Hip external rotation      Knee flexion      Knee extension      Ankle dorsiflexion      Ankle plantarflexion      Ankle inversion      Ankle eversion       (Blank rows = not tested)   LOWER EXTREMITY MMT:  [facility]   MMT Right eval Left eval  Hip  flexion      Hip extension      Hip abduction      Hip adduction      Hip internal rotation      Hip external rotation      Knee flexion      Knee extension      Ankle dorsiflexion      Ankle plantarflexion      Ankle inversion      Ankle eversion      Core/trunk 3 3   (Blank rows = not tested)   LUMBAR SPECIAL TESTS:  Deferred due to fusion   FUNCTIONAL TESTS:  NT   GAIT: Distance walked: 4375ft x2 Assistive device utilized: None Level of assistance: Complete Independence Comments: antalgic     HOME EXERCISE PROGRAM: Access Code: [email or web address] URL: [email or web address] Date: 01/31/2023 Prepared by: Gustavus BryantJeffrey Ziemba  Exercises - Supine Posterior Pelvic Tilt  - 2 x daily - 5 x weekly - 1 sets - 10 reps - 3s hold - Supine 90/90 Alternating Heel Touches with Posterior Pelvic Tilt  - 2 x daily - 5 x weekly - 1 sets - 2 reps - 30s hold - Supine 90/90 Abdominal Bracing  - 2 x daily - 5 x weekly - 1 sets - 2 reps - 30s hold - Hooklying Single Leg Bent Knee Fallouts with Resistance  - 2 x daily - 5 x weekly - 3 sets - 15 reps - Clamshell with Resistance  - 1 x daily - 5 x weekly - 2 sets - 15 reps    [facility]:                                                DATE: 02/14/23 Pt seen for aquatic therapy today.  Treatment took place in water 3.5-4.75 ft in [facility]. Temp of water was 91.  Pt entered/exited the pool via  stairs independently with bilat rail. - walking with UE on barbell, forward/backward  - side stepping with/ without barbell - side stepping with arm addct with rainbow hand floats - 3 laps - walking/marching with bilat rainbow hand floats under water at side;  repeated with single hand float under water - staggered stance with kick board row, adjusting speed accordingly 10 x 2 - TrA set with short blue noodle pull down to thighs - straddling yellow noodle and cycling with doggie paddle arms in deepest water - multiple laps, cc ski, jumping jack legs -seated on noodle "like swing" for seated balance challenge   Pt requires the buoyancy and hydrostatic pressure of water for support, and to offload joints by unweighting joint load by at least 50 % in navel deep water and by at least 75-80% in chest to neck deep water.  Viscosity of the water is needed for resistance of strengthening. Water current perturbations provides challenge to standing balance requiring increased core activation.   [facility]:                                                DATE: 01/31/23 Therapeutic Exercise: Nustep L4 8 min Seated latissimus press 3s 15x  Seated lat press with FAQs 15/15 Seated lat press  with alt. marching 15/15 L hip flexor stretch 30s x3 Supine heel taps 30s x2 R S/L clams  Supine hip fallouts RTB 15x B, 15/15 unilaterally   [facility] Adult PT Treatment:                                                DATE: 01/24/23 Therapeutic Exercise: Nustep L2 6 min Supine hip fallouts BluTB 15x B 15/15 unilaterally S/L L clamshell 15x manual resistance L hip flexor stretch with bolster 30s x2 90/90 30s x2 2# Manual Therapy: Skilled palpation to identify taught bands to L TFL muscle in R S/L position Trigger Point Dry Needling Treatment: Pre-treatment instruction: Patient instructed on dry needling rationale, procedures, and possible side effects including pain during treatment (achy,cramping  feeling), bruising, drop of blood, lightheadedness, nausea, sweating. Patient Consent Given: Yes Education handout provided: Previously provided Muscles treated: L TFL  Needle size and number: .30x37mm x 1 Electrical stimulation performed: No Parameters: N/A Treatment response/outcome: Twitch response elicited, Palpable decrease in muscle tension, and decrease TTP Post-treatment instructions: Patient instructed to expect possible mild to moderate muscle soreness later today and/or tomorrow. Patient instructed in methods to reduce muscle soreness and to continue prescribed HEP. If patient was dry needled over the lung field, patient was instructed on signs and symptoms of pneumothorax and, however unlikely, to see immediate medical attention should they occur. Patient was also educated on signs and symptoms of infection and to seek medical attention should they occur. Patient verbalized understanding of these instructions and education.    [facility] Adult PT Treatment:                                                DATE: 01/20/23 Therapeutic Exercise: Nustep L2 6 min Supine hip fallouts GTB 15x B 15/15 unilaterally S/L L clamshell GTB 15x L hip flexor stretch with bolster 30s x2 90/90 30s x2 Manual Therapy: Skilled palpation to identify taught bands to L piriformis muscle in R S/L position Trigger Point Dry Needling Treatment: Pre-treatment instruction: Patient instructed on dry needling rationale, procedures, and possible side effects including pain during treatment (achy,cramping feeling), bruising, drop of blood, lightheadedness, nausea, sweating. Patient Consent Given: Yes Education handout provided: Previously provided Muscles treated: L piriformis  Needle size and number: .30x44mm x 1 Electrical stimulation performed: No Parameters: N/A Treatment response/outcome: Twitch response elicited and Palpable decrease in muscle tension Post-treatment instructions: Patient instructed to expect  possible mild to moderate muscle soreness later today and/or tomorrow. Patient instructed in methods to reduce muscle soreness and to continue prescribed HEP. If patient was dry needled over the lung field, patient was instructed on signs and symptoms of pneumothorax and, however unlikely, to see immediate medical attention should they occur. Patient was also educated on signs and symptoms of infection and to seek medical attention should they occur. Patient verbalized understanding of these instructions and education.   [facility] Adult PT Treatment:                                                DATE: 01/17/23 Therapeutic Exercise: Lora Paula  L1 6 min Seated latissimus press 3s hold 15x Seated lat press with alternating LAQs 15/15 Seated lat press with alternating marches 10/10 Seated hip toss 0# 10/10 Seated V's 0# 10/10 Seated chops 0# 10/10 TTP at L piriformis   [facility] Adult PT Treatment:                                                DATE: 01/13/23 Aquatic therapy at [facility] [address] - therapeutic pool temp 92 degrees Pt enters building independently.  Treatment took place in water 3.8 to  4 ft 8 in.feet deep depending upon activity.  Pt entered and exited the pool via stair and handrails    Aquatic Therapy:  Water walking for warm up fwd/lat/bkwds  Standing: Walking march with rainbow DB alternating push/pull Holding onto barbell: 3 way hip x10 each BIL Leg extended hip flex/ext x20 BIL Hip abd/add x20 BIL Squats x20 Heel raises - x20 Green noodle stomp x20 ea (CGA) Sitting on submerged bench: LAQ x1' Bicycle kick x1' Scissor kick x1' Kickboard push/pull x1' Kickboard push down x1' NOT TODAY Lunge fwd x20 Lunge lateral x20 Step up and overs on submerged step 2x10 BIL Kickboard push down - 10x Kickboard - push/pull - 20x  Standing march   Pt requires the buoyancy of water for active assisted exercises with buoyancy supported for strengthening and AROM exercises.  Hydrostatic pressure also supports joints by unweighting joint load by at least 50 % in 3-4 feet depth water. 80% in chest to neck deep water. Water will provide assistance with movement using the current and laminar flow while the buoyancy reduces weight bearing. Pt requires the viscosity of the water for resistance with strengthening exercises.  [facility] Adult PT Treatment:                                                DATE: 01-11-23 Aquatic therapy at [facility] [address] - therapeutic pool temp 92 degrees Pt enters building independently.  Treatment took place in water 3.8 to  4 ft 8 in.feet deep depending upon activity.  Pt entered and exited the pool via stair and handrails   Pain at initiation of RX session 6/10  at end  3/10 Aquatic Therapy:  Water walking for warm up fwd/sideways/backward Ms Runyan was educated on  beneficial therapeutic effects of water while ambulating to acclimate to water walking forward, backward and side stepping.  Pt educated on neutral posture and hip hinging in seated position with water at chest level x 10 with stretch to low back and then x 10 with back at pool wall at external cue, VC for neck tucked to prevent hyperextension.  Standing: Walking march with kickboard push down alternating Leg extended hip flex/ext x20 BIL  Hip abd/add x20 BIL Squats x20 Lunge to target x 15 Gentle trunk rotation with pool noodle Heel raises - x20 Hip CCW and CW x 10 BIL Runners Stretch x 30" x 2 BIL Hamstring stretch x 30" x 2 BIL Step ups on submerged step x10 BIL fwd/lat Sitting on submerged bench: LAQ x1' Bicycle kick x1' Scissor kick x1' [place], Pt with lumbar belt around hips and nek doodle for neck support.  Pt assisted into supine floating position by lying head on shoulder of PT to get into floating position. . PT at torso and assisting with trunk left to right and vice versa to engage trunk muscles. PT then rotated trunk in order to engage abdominal  (internal and external obliques) Emphasis on breathing techniques to draw in abdominals for support.  Pt then utilizing posterior chain and engaging Hip extension and knee flexion with water resistance while PT used Aqua stretch techniques to decrease muscle tension in low back.    ASSESSMENT:   CLINICAL IMPRESSION: Pt is a former Counselling psychologist and confident in aquatic environment; able to take direction from therapist on deck. She verbalized resolution of FI symptoms prior to entry in pool today; states it only occurs "every 3-4 months".  She tolerated all exercises well, especially the suspended cycling.  On land she is observed with Rt lateral trunk lean; in water she is able to correct to neutral with focused concentration. Lateral trunk lean partially due to valgus Rt knee and reconstructed lower leg.   She reported overall reduction of pain while in water.  Will plan to update her HEP in upcoming visits; plans to [facility] (hasn't yet).     OBJECTIVE IMPAIRMENTS: Abnormal gait, decreased activity tolerance, decreased balance, decreased mobility, difficulty walking, decreased ROM, decreased strength, impaired perceived functional ability, impaired flexibility, improper body mechanics, postural dysfunction, and pain.    ACTIVITY LIMITATIONS: carrying, lifting, bending, standing, squatting, and stairs   PERSONAL FACTORS: Age, Fitness, Past/current experiences, Time since onset of injury/illness/exacerbation, and 1 comorbidity: RA  are also affecting patient's functional outcome.    REHAB POTENTIAL: Good   CLINICAL DECISION MAKING: Evolving/moderate complexity   EVALUATION COMPLEXITY: Moderate     GOALS: Goals reviewed with patient? Yes   SHORT TERM GOALS: Target date: 12/28/2022     Begin aquatic program. Baseline: TBD Goal status: MET Initiated 01/05/23   2.  Patient to demonstrate independence in HEP  Baseline: [id number] Goal status: Met 01/17/23 Reviewed  with patient on 01/06/23     LONG TERM GOALS: Target date: POC     Increase trunk and core strength to 4/5 Baseline: 3/5 Goal status: Progressing   2.  Increase FOTO score to 44 Baseline: 32; 01/17/23 44  Goal status: Met   3.  Patient to perform 5x STS with arms crossed in <20s Baseline: TBD Goal status: Ongoing   4.  Decrease worst pain to 6/10 Baseline: 8/10 worst pain Goal status: Ongoing     PLAN:   PT FREQUENCY: 2x/week   PT DURATION: 3 weeks   PLANNED INTERVENTIONS: Therapeutic exercises, Therapeutic activity, Neuromuscular re-education, Balance training, Gait training, Patient/Family education, Self Care, Joint mobilization, Stair training, Aquatic Therapy, Dry Needling, Manual therapy, and Re-evaluation.   PLAN FOR NEXT SESSION: Initiate aquatic therapy, core and trunk strengthening, postural exercises, stretching tasks    Mayer Camel, [place] 02/14/23 1:29 PM [facility] [address] [place], [place], [address] Phone: [phone]   Fax:  [phone]

## 2023-02-16 ENCOUNTER — Ambulatory Visit (HOSPITAL_BASED_OUTPATIENT_CLINIC_OR_DEPARTMENT_OTHER): Payer: Medicare Other | Admitting: Physical Therapy

## 2023-02-16 ENCOUNTER — Encounter (HOSPITAL_BASED_OUTPATIENT_CLINIC_OR_DEPARTMENT_OTHER): Payer: Self-pay | Admitting: Physical Therapy

## 2023-02-16 DIAGNOSIS — M069 Rheumatoid arthritis, unspecified: Secondary | ICD-10-CM

## 2023-02-16 DIAGNOSIS — M5459 Other low back pain: Secondary | ICD-10-CM

## 2023-02-16 DIAGNOSIS — M4326 Fusion of spine, lumbar region: Secondary | ICD-10-CM

## 2023-02-16 NOTE — Therapy (Signed)
OUTPATIENT PHYSICAL THERAPY TREATMENT NOTE   Patient Name: Tanya Duncan MRN: [id number] DOB:[birth year], 77 y.o., female Today's Date: 02/16/2023  PCP: Tracey Harries, MD    REFERRING PROVIDER: Barbarann Ehlers    PT End of Session - 02/16/23 1623     Visit Number 11    Number of Visits 16    Date for PT Re-Evaluation 04/07/23    Authorization Type MCR    PT Start Time 1620    PT Stop Time 1700    PT Time Calculation (min) 40 min    Activity Tolerance Patient tolerated treatment well    Behavior During Therapy [facility] for tasks assessed/performed                 Past Medical History:  Diagnosis Date   Anxiety    Arthritis    Blood transfusion without reported diagnosis 05/2019   with hip surgery   Fibromyalgia    Fractured pelvis 09/15/2020   right   GERD (gastroesophageal reflux disease)    GI bleed 4098,1191   x2   Hyperlipidemia    Pneumonia 2005   Stomach ulcer 1990   Past Surgical History:  Procedure Laterality Date   ABDOMINAL HYSTERECTOMY     FRACTURE SURGERY Right 1998   ankle   JOINT REPLACEMENT     LUMBAR FUSION  2015   OPEN REDUCTION INTERNAL FIXATION (ORIF) PROXIMAL PHALANX Right 09/21/2020   Procedure: OPEN TREATMENT OF RIGHT LONG FINGER PROXIMAL PHALANX FRACTURE;  Surgeon: Mack Hook, MD;  Location: [facility] OR;  Service: Orthopedics;  Laterality: Right;  LENGTH OF SURGERY: 75 MIN   ORIF HUMERUS FRACTURE Left    ORIF TIBIA FRACTURE Right 1998   with bone graft   PYLOROPLASTY     REVERSE SHOULDER ARTHROPLASTY Left 02/04/2021   Procedure: REVERSE SHOULDER ARTHROPLASTY WITH HARDWARE REMOVAL;  Surgeon: Jones Broom, MD;  Location: [facility] ORS;  Service: Orthopedics;  Laterality: Left;   TOTAL HIP ARTHROPLASTY  2011   TOTAL HIP ARTHROPLASTY Right 05/14/2019   Procedure: Right Anterior Hip Arthroplasty;  Surgeon: Marcene Corning, MD;  Location: [facility] ORS;  Service: Orthopedics;  Laterality: Right;   TOTAL SHOULDER REPLACEMENT  2014   Patient Active  Problem List   Diagnosis Date Noted   Palpitations 09/01/2020   Chest pain 09/01/2020   Abnormal ECG 09/01/2020   Moderate episode of recurrent major depressive disorder 06/22/2020   Prediabetes 06/22/2020   Mixed hyperlipidemia 06/22/2020   History of total right hip arthroplasty 05/17/2019   Anxiety    Primary osteoarthritis of right hip 05/14/2019   Vitamin D deficiency 01/10/2019   Insomnia due to anxiety and fear 10/25/2017   Chronic prescription benzodiazepine use 10/25/2017   Chronic bilateral low back pain without sciatica 04/27/2017   Osteoarthritis 01/06/2017   GAD (generalized anxiety disorder) 01/06/2017   Rheumatoid arthritis involving multiple sites with positive rheumatoid factor 12/12/2016   Status post lumbar spinal fusion 12/12/2016   Fibromyalgia 12/12/2016    THERAPY DIAG:  Chronic low back pain   S/P lumbar fusion    Rationale for Evaluation and Treatment Rehabilitation  REFERRING DIAG: M05.79 (ICD-10-CM) - Rheumatoid arthritis with rheumatoid factor of multiple sites without organ or systems involvement M54.50 (ICD-10-CM) - Low back pain, unspecified G89.29 (ICD-10-CM) - Other chronic pain   PERTINENT HISTORY: Lumbar fusion and foraminotomy 10 years ago, B THA, hx of L TSA reverse   PRECAUTIONS/RESTRICTIONS:   lumbar fusion, RA  SUBJECTIVE: Pt reports increased pain symptoms today probs due  to weather   When asked about recent GI doctor visit, pt reports she does not have any issues with fecal incontinence right now, "that's passed".     PAIN:  Are you having pain? Yes: NPRS scale: 6-7/10 Pain location: sacral region  Pain description: ache  Aggravating factors: activity Relieving factors: meds and thermal modalities  OBJECTIVE: (objective measures completed at initial evaluation unless otherwise dated)  DIAGNOSTIC FINDINGS:  CLINICAL DATA:  Sacral pain since the patient suffered a fall when she was pulled down by her dog 09/08/2020.    EXAM: MRI LUMBAR SPINE WITHOUT CONTRAST   TECHNIQUE: Multiplanar, multisequence MR imaging of the lumbar spine was performed. No intravenous contrast was administered.   COMPARISON:  CT of the right hip 10/20/2020. MRI of the right hip 02/01/2020.   FINDINGS: Bones/Joint/Cartilage   There is marrow edema throughout the left sacrum due to a mildly displaced fracture. A small focus of marrow edema is seen in the left ilium adjacent to the SI joint consistent with nondisplaced fracture. Also seen is a mild inferior endplate compression fracture of L5 eccentric to the right with vertebral body height loss of approximately 10% and associated marrow edema.   There is partial visualization of lumbar fusion hardware extending from L4 above the superior aspect of the scan. Artifact from bilateral hip replacements also noted. No worrisome lesion is identified.   Ligaments   Negative.   Muscles and Tendons   Intact.   Soft tissues   Sigmoid diverticulosis noted.   IMPRESSION: Acute or subacute minimally displaced fracture of the left sacrum. There is also a small fracture in the left ilium adjacent to the SI joint.   Acute or subacute mild inferior endplate compression fracture L5.   Diverticulosis.     Electronically Signed   By: Drusilla Kanner M.D.   On: 12/16/2020 09:57   PATIENT SURVEYS:  FOTO 32(44 predicted); 01/17/23 44   SCREENING FOR RED FLAGS: Bowel or bladder incontinence: No   COGNITION: Overall cognitive status: Within functional limits for tasks assessed                          SENSATION: Not tested   MUSCLE LENGTH: Hamstrings: Right 90 deg; Left 90 deg     POSTURE: decreased lumbar lordosis and side bent R with R knee flexed  in valgus with pronated R ankle   PALPATION: NT   LUMBAR ROM:    AROM eval  Flexion 75%  Extension 25%  Right lateral flexion 25%  Left lateral flexion 25%  Right rotation 90%  Left rotation 75%   (Blank  rows = not tested)   LOWER EXTREMITY ROM:   WFL   Active  Right eval Left eval  Hip flexion      Hip extension      Hip abduction      Hip adduction      Hip internal rotation      Hip external rotation      Knee flexion      Knee extension      Ankle dorsiflexion      Ankle plantarflexion      Ankle inversion      Ankle eversion       (Blank rows = not tested)   LOWER EXTREMITY MMT:  [facility]   MMT Right eval Left eval  Hip flexion      Hip extension      Hip abduction  Hip adduction      Hip internal rotation      Hip external rotation      Knee flexion      Knee extension      Ankle dorsiflexion      Ankle plantarflexion      Ankle inversion      Ankle eversion      Core/trunk 3 3   (Blank rows = not tested)   LUMBAR SPECIAL TESTS:  Deferred due to fusion   FUNCTIONAL TESTS:  NT   GAIT: Distance walked: 7ft x2 Assistive device utilized: None Level of assistance: Complete Independence Comments: antalgic     HOME EXERCISE PROGRAM: Access Code: [email or web address] URL: [email or web address] Date: 01/31/2023 Prepared by: Gustavus Bryant  Exercises - Supine Posterior Pelvic Tilt  - 2 x daily - 5 x weekly - 1 sets - 10 reps - 3s hold - Supine 90/90 Alternating Heel Touches with Posterior Pelvic Tilt  - 2 x daily - 5 x weekly - 1 sets - 2 reps - 30s hold - Supine 90/90 Abdominal Bracing  - 2 x daily - 5 x weekly - 1 sets - 2 reps - 30s hold - Hooklying Single Leg Bent Knee Fallouts with Resistance  - 2 x daily - 5 x weekly - 3 sets - 15 reps - Clamshell with Resistance  - 1 x daily - 5 x weekly - 2 sets - 15 reps   [facility] Adult PT Treatment:                                                DATE: 02/16/23 Pt seen for aquatic therapy today.  Treatment took place in water 3.5-4.75 ft in depth at the [facility] pool. Temp of water was 91.  Pt entered/exited the pool via stairs independently with bilat rail. - walking unsupported forward/backward   - side stepping with/ without barbell - forward, back and side stepping ,core engagement with rainbow HB submerged to sides -left side bending resisted with rainbow HB, rue resting on kick board - suspended sup with nek doodle and noodles: hip extension; knee flex.  Isometric hold glut in end range hip ext -hip hiking on bottom step x10-12 R/L - TrA set with short blue noodle pull down to thighs in wide stance then staggered   Pt requires the buoyancy and hydrostatic pressure of water for support, and to offload joints by unweighting joint load by at least 50 % in navel deep water and by at least 75-80% in chest to neck deep water.  Viscosity of the water is needed for resistance of strengthening. Water current perturbations provides challenge to standing balance requiring increased core activation.  [facility] Adult PT Treatment:                                                DATE: 02/14/23 Pt seen for aquatic therapy today.  Treatment took place in water 3.5-4.75 ft in depth at the [facility] pool. Temp of water was 91.  Pt entered/exited the pool via stairs independently with bilat rail. - walking with UE on barbell, forward/backward  - side stepping with/ without barbell - side stepping with arm addct with rainbow hand floats -  3 laps - walking/marching with bilat rainbow hand floats under water at side;  repeated with single hand float under water - staggered stance with kick board row, adjusting speed accordingly 10 x 2 - TrA set with short blue noodle pull down to thighs - straddling yellow noodle and cycling with doggie paddle arms in deepest water - multiple laps, cc ski, jumping jack legs -seated on noodle "like swing" for seated balance challenge   Pt requires the buoyancy and hydrostatic pressure of water for support, and to offload joints by unweighting joint load by at least 50 % in navel deep water and by at least 75-80% in chest to neck deep water.  Viscosity of the water  is needed for resistance of strengthening. Water current perturbations provides challenge to standing balance requiring increased core activation.   [facility] Adult PT Treatment:                                                DATE: 01/31/23 Therapeutic Exercise: Nustep L4 8 min Seated latissimus press 3s 15x  Seated lat press with FAQs 15/15 Seated lat press with alt. marching 15/15 L hip flexor stretch 30s x3 Supine heel taps 30s x2 R S/L clams  Supine hip fallouts RTB 15x B, 15/15 unilaterally   [facility] Adult PT Treatment:                                                DATE: 01/24/23 Therapeutic Exercise: Nustep L2 6 min Supine hip fallouts BluTB 15x B 15/15 unilaterally S/L L clamshell 15x manual resistance L hip flexor stretch with bolster 30s x2 90/90 30s x2 2# Manual Therapy: Skilled palpation to identify taught bands to L TFL muscle in R S/L position Trigger Point Dry Needling Treatment: Pre-treatment instruction: Patient instructed on dry needling rationale, procedures, and possible side effects including pain during treatment (achy,cramping feeling), bruising, drop of blood, lightheadedness, nausea, sweating. Patient Consent Given: Yes Education handout provided: Previously provided Muscles treated: L TFL  Needle size and number: .30x3050mm x 1 Electrical stimulation performed: No Parameters: N/A Treatment response/outcome: Twitch response elicited, Palpable decrease in muscle tension, and decrease TTP Post-treatment instructions: Patient instructed to expect possible mild to moderate muscle soreness later today and/or tomorrow. Patient instructed in methods to reduce muscle soreness and to continue prescribed HEP. If patient was dry needled over the lung field, patient was instructed on signs and symptoms of pneumothorax and, however unlikely, to see immediate medical attention should they occur. Patient was also educated on signs and symptoms of infection and to seek medical attention  should they occur. Patient verbalized understanding of these instructions and education.    [facility] Adult PT Treatment:                                                DATE: 01/20/23 Therapeutic Exercise: Nustep L2 6 min Supine hip fallouts GTB 15x B 15/15 unilaterally S/L L clamshell GTB 15x L hip flexor stretch with bolster 30s x2 90/90 30s x2 Manual Therapy: Skilled palpation to identify taught bands to L piriformis muscle  in R S/L position Trigger Point Dry Needling Treatment: Pre-treatment instruction: Patient instructed on dry needling rationale, procedures, and possible side effects including pain during treatment (achy,cramping feeling), bruising, drop of blood, lightheadedness, nausea, sweating. Patient Consent Given: Yes Education handout provided: Previously provided Muscles treated: L piriformis  Needle size and number: .30x59mm x 1 Electrical stimulation performed: No Parameters: N/A Treatment response/outcome: Twitch response elicited and Palpable decrease in muscle tension Post-treatment instructions: Patient instructed to expect possible mild to moderate muscle soreness later today and/or tomorrow. Patient instructed in methods to reduce muscle soreness and to continue prescribed HEP. If patient was dry needled over the lung field, patient was instructed on signs and symptoms of pneumothorax and, however unlikely, to see immediate medical attention should they occur. Patient was also educated on signs and symptoms of infection and to seek medical attention should they occur. Patient verbalized understanding of these instructions and education.   [facility] Adult PT Treatment:                                                DATE: 01/17/23 Therapeutic Exercise: Nustep L1 6 min Seated latissimus press 3s hold 15x Seated lat press with alternating LAQs 15/15 Seated lat press with alternating marches 10/10 Seated hip toss 0# 10/10 Seated V's 0# 10/10 Seated chops 0# 10/10 TTP at L  piriformis   [facility] Adult PT Treatment:                                                DATE: 01/13/23 Aquatic therapy at [facility] [address] - therapeutic pool temp 92 degrees Pt enters building independently.  Treatment took place in water 3.8 to  4 ft 8 in.feet deep depending upon activity.  Pt entered and exited the pool via stair and handrails    Aquatic Therapy:  Water walking for warm up fwd/lat/bkwds  Standing: Walking march with rainbow DB alternating push/pull Holding onto barbell: 3 way hip x10 each BIL Leg extended hip flex/ext x20 BIL Hip abd/add x20 BIL Squats x20 Heel raises - x20 Green noodle stomp x20 ea (CGA) Sitting on submerged bench: LAQ x1' Bicycle kick x1' Scissor kick x1' Kickboard push/pull x1' Kickboard push down x1' NOT TODAY Lunge fwd x20 Lunge lateral x20 Step up and overs on submerged step 2x10 BIL Kickboard push down - 10x Kickboard - push/pull - 20x  Standing march   Pt requires the buoyancy of water for active assisted exercises with buoyancy supported for strengthening and AROM exercises. Hydrostatic pressure also supports joints by unweighting joint load by at least 50 % in 3-4 feet depth water. 80% in chest to neck deep water. Water will provide assistance with movement using the current and laminar flow while the buoyancy reduces weight bearing. Pt requires the viscosity of the water for resistance with strengthening exercises.  [facility] Adult PT Treatment:                                                DATE: 01-11-23 Aquatic therapy at [facility] [address] - therapeutic pool temp 92 degrees  Pt enters building independently.  Treatment took place in water 3.8 to  4 ft 8 in.feet deep depending upon activity.  Pt entered and exited the pool via stair and handrails   Pain at initiation of RX session 6/10  at end  3/10 Aquatic Therapy:  Water walking for warm up fwd/sideways/backward Ms Oliger was educated on  beneficial  therapeutic effects of water while ambulating to acclimate to water walking forward, backward and side stepping.  Pt educated on neutral posture and hip hinging in seated position with water at chest level x 10 with stretch to low back and then x 10 with back at pool wall at external cue, VC for neck tucked to prevent hyperextension.  Standing: Walking march with kickboard push down alternating Leg extended hip flex/ext x20 BIL  Hip abd/add x20 BIL Squats x20 Lunge to target x 15 Gentle trunk rotation with pool noodle Heel raises - x20 Hip CCW and CW x 10 BIL Runners Stretch x 30" x 2 BIL Hamstring stretch x 30" x 2 BIL Step ups on submerged step x10 BIL fwd/lat Sitting on submerged bench: LAQ x1' Bicycle kick x1' Scissor kick x1' [place], Pt with lumbar belt around hips and nek doodle for neck support.   Pt assisted into supine floating position by lying head on shoulder of PT to get into floating position. . PT at torso and assisting with trunk left to right and vice versa to engage trunk muscles. PT then rotated trunk in order to engage abdominal (internal and external obliques) Emphasis on breathing techniques to draw in abdominals for support.  Pt then utilizing posterior chain and engaging Hip extension and knee flexion with water resistance while PT used Aqua stretch techniques to decrease muscle tension in low back.    ASSESSMENT:   CLINICAL IMPRESSION: Focus on post core and hip strength. Good toleration to progression. Of note: right glut firing is slow and weak (as compared to left) , improves with repetition. She has laminated copy of aquatic hep which she will complete on her own before next session.  Progressing well. May need to add/adjust HEP going forward    OBJECTIVE IMPAIRMENTS: Abnormal gait, decreased activity tolerance, decreased balance, decreased mobility, difficulty walking, decreased ROM, decreased strength, impaired perceived functional ability, impaired  flexibility, improper body mechanics, postural dysfunction, and pain.    ACTIVITY LIMITATIONS: carrying, lifting, bending, standing, squatting, and stairs   PERSONAL FACTORS: Age, Fitness, Past/current experiences, Time since onset of injury/illness/exacerbation, and 1 comorbidity: RA  are also affecting patient's functional outcome.    REHAB POTENTIAL: Good   CLINICAL DECISION MAKING: Evolving/moderate complexity   EVALUATION COMPLEXITY: Moderate     GOALS: Goals reviewed with patient? Yes   SHORT TERM GOALS: Target date: 12/28/2022     Begin aquatic program. Baseline: TBD Goal status: MET Initiated 01/05/23   2.  Patient to demonstrate independence in HEP  Baseline: [id number] Goal status: Met 01/17/23 Reviewed with patient on 01/06/23     LONG TERM GOALS: Target date: POC     Increase trunk and core strength to 4/5 Baseline: 3/5 Goal status: Progressing   2.  Increase FOTO score to 44 Baseline: 32; 01/17/23 44  Goal status: Met   3.  Patient to perform 5x STS with arms crossed in <20s Baseline: TBD Goal status: Ongoing   4.  Decrease worst pain to 6/10 Baseline: 8/10 worst pain Goal status: Ongoing     PLAN:   PT FREQUENCY: 2x/week   PT  DURATION: 3 weeks   PLANNED INTERVENTIONS: Therapeutic exercises, Therapeutic activity, Neuromuscular re-education, Balance training, Gait training, Patient/Family education, Self Care, Joint mobilization, Stair training, Aquatic Therapy, Dry Needling, Manual therapy, and Re-evaluation.   PLAN FOR NEXT SESSION: Initiate aquatic therapy, core and trunk strengthening, postural exercises, stretching tasks    Rushie Chestnut) Cameo Shewell MPT 02/16/23 7:14 PM [facility] [address] [place], [place], [address] Phone: [phone]   Fax:  [phone]

## 2023-02-21 ENCOUNTER — Ambulatory Visit (HOSPITAL_BASED_OUTPATIENT_CLINIC_OR_DEPARTMENT_OTHER): Payer: Medicare Other | Admitting: Physical Therapy

## 2023-02-21 ENCOUNTER — Encounter (HOSPITAL_BASED_OUTPATIENT_CLINIC_OR_DEPARTMENT_OTHER): Payer: Self-pay | Admitting: Physical Therapy

## 2023-02-21 DIAGNOSIS — M5459 Other low back pain: Secondary | ICD-10-CM

## 2023-02-21 DIAGNOSIS — M4326 Fusion of spine, lumbar region: Secondary | ICD-10-CM

## 2023-02-21 DIAGNOSIS — M069 Rheumatoid arthritis, unspecified: Secondary | ICD-10-CM

## 2023-02-21 NOTE — Therapy (Signed)
OUTPATIENT PHYSICAL THERAPY TREATMENT NOTE   Patient Name: Tanya Duncan MRN: [id number] DOB:04/21/1946, 77 y.o., female Today's Date: 02/21/2023  PCP: Tracey Harries, MD    REFERRING PROVIDER: Barbarann Ehlers    PT End of Session - 02/21/23 1208     Visit Number 12    Number of Visits 16    Date for PT Re-Evaluation 04/07/23    Authorization Type MCR    PT Start Time 1205    PT Stop Time 1245    PT Time Calculation (min) 40 min    Activity Tolerance Patient tolerated treatment well    Behavior During Therapy [facility] for tasks assessed/performed                 Past Medical History:  Diagnosis Date   Anxiety    Arthritis    Blood transfusion without reported diagnosis 05/2019   with hip surgery   Fibromyalgia    Fractured pelvis 09/15/2020   right   GERD (gastroesophageal reflux disease)    GI bleed 4098,1191   x2   Hyperlipidemia    Pneumonia 2005   Stomach ulcer 1990   Past Surgical History:  Procedure Laterality Date   ABDOMINAL HYSTERECTOMY     FRACTURE SURGERY Right 1998   ankle   JOINT REPLACEMENT     LUMBAR FUSION  2015   OPEN REDUCTION INTERNAL FIXATION (ORIF) PROXIMAL PHALANX Right 09/21/2020   Procedure: OPEN TREATMENT OF RIGHT LONG FINGER PROXIMAL PHALANX FRACTURE;  Surgeon: Mack Hook, MD;  Location: [facility] OR;  Service: Orthopedics;  Laterality: Right;  LENGTH OF SURGERY: 75 MIN   ORIF HUMERUS FRACTURE Left    ORIF TIBIA FRACTURE Right 1998   with bone graft   PYLOROPLASTY     REVERSE SHOULDER ARTHROPLASTY Left 02/04/2021   Procedure: REVERSE SHOULDER ARTHROPLASTY WITH HARDWARE REMOVAL;  Surgeon: Jones Broom, MD;  Location: [facility] ORS;  Service: Orthopedics;  Laterality: Left;   TOTAL HIP ARTHROPLASTY  2011   TOTAL HIP ARTHROPLASTY Right 05/14/2019   Procedure: Right Anterior Hip Arthroplasty;  Surgeon: Marcene Corning, MD;  Location: [facility] ORS;  Service: Orthopedics;  Laterality: Right;   TOTAL SHOULDER REPLACEMENT  2014   Patient Active  Problem List   Diagnosis Date Noted   Palpitations 09/01/2020   Chest pain 09/01/2020   Abnormal ECG 09/01/2020   Moderate episode of recurrent major depressive disorder 06/22/2020   Prediabetes 06/22/2020   Mixed hyperlipidemia 06/22/2020   History of total right hip arthroplasty 05/17/2019   Anxiety    Primary osteoarthritis of right hip 05/14/2019   Vitamin D deficiency 01/10/2019   Insomnia due to anxiety and fear 10/25/2017   Chronic prescription benzodiazepine use 10/25/2017   Chronic bilateral low back pain without sciatica 04/27/2017   Osteoarthritis 01/06/2017   GAD (generalized anxiety disorder) 01/06/2017   Rheumatoid arthritis involving multiple sites with positive rheumatoid factor 12/12/2016   Status post lumbar spinal fusion 12/12/2016   Fibromyalgia 12/12/2016    THERAPY DIAG:  Chronic low back pain   S/P lumbar fusion    Rationale for Evaluation and Treatment Rehabilitation  REFERRING DIAG: M05.79 (ICD-10-CM) - Rheumatoid arthritis with rheumatoid factor of multiple sites without organ or systems involvement M54.50 (ICD-10-CM) - Low back pain, unspecified G89.29 (ICD-10-CM) - Other chronic pain   PERTINENT HISTORY: Lumbar fusion and foraminotomy 10 years ago, B THA, hx of L TSA reverse   PRECAUTIONS/RESTRICTIONS:   lumbar fusion, RA  SUBJECTIVE: Pt reports increased pain up to 8/10 for  3 days following last session.     PAIN:  Are you having pain? Yes: NPRS scale: 6-7/10 Pain location: sacral region  Pain description: ache  Aggravating factors: activity Relieving factors: meds and thermal modalities  OBJECTIVE: (objective measures completed at initial evaluation unless otherwise dated)  DIAGNOSTIC FINDINGS:  CLINICAL DATA:  Sacral pain since the patient suffered a fall when she was pulled down by her dog 09/08/2020.   EXAM: MRI LUMBAR SPINE WITHOUT CONTRAST   TECHNIQUE: Multiplanar, multisequence MR imaging of the lumbar spine  was performed. No intravenous contrast was administered.   COMPARISON:  CT of the right hip 10/20/2020. MRI of the right hip 02/01/2020.   FINDINGS: Bones/Joint/Cartilage   There is marrow edema throughout the left sacrum due to a mildly displaced fracture. A small focus of marrow edema is seen in the left ilium adjacent to the SI joint consistent with nondisplaced fracture. Also seen is a mild inferior endplate compression fracture of L5 eccentric to the right with vertebral body height loss of approximately 10% and associated marrow edema.   There is partial visualization of lumbar fusion hardware extending from L4 above the superior aspect of the scan. Artifact from bilateral hip replacements also noted. No worrisome lesion is identified.   Ligaments   Negative.   Muscles and Tendons   Intact.   Soft tissues   Sigmoid diverticulosis noted.   IMPRESSION: Acute or subacute minimally displaced fracture of the left sacrum. There is also a small fracture in the left ilium adjacent to the SI joint.   Acute or subacute mild inferior endplate compression fracture L5.   Diverticulosis.     Electronically Signed   By: Drusilla Kanner M.D.   On: 12/16/2020 09:57   PATIENT SURVEYS:  FOTO 32(44 predicted); 01/17/23 44   SCREENING FOR RED FLAGS: Bowel or bladder incontinence: No   COGNITION: Overall cognitive status: Within functional limits for tasks assessed                          SENSATION: Not tested   MUSCLE LENGTH: Hamstrings: Right 90 deg; Left 90 deg     POSTURE: decreased lumbar lordosis and side bent R with R knee flexed  in valgus with pronated R ankle   PALPATION: NT   LUMBAR ROM:    AROM eval  Flexion 75%  Extension 25%  Right lateral flexion 25%  Left lateral flexion 25%  Right rotation 90%  Left rotation 75%   (Blank rows = not tested)   LOWER EXTREMITY ROM:   WFL   Active  Right eval Left eval  Hip flexion      Hip extension       Hip abduction      Hip adduction      Hip internal rotation      Hip external rotation      Knee flexion      Knee extension      Ankle dorsiflexion      Ankle plantarflexion      Ankle inversion      Ankle eversion       (Blank rows = not tested)   LOWER EXTREMITY MMT:  [facility]   MMT Right eval Left eval  Hip flexion      Hip extension      Hip abduction      Hip adduction      Hip internal rotation      Hip external  rotation      Knee flexion      Knee extension      Ankle dorsiflexion      Ankle plantarflexion      Ankle inversion      Ankle eversion      Core/trunk 3 3   (Blank rows = not tested)   LUMBAR SPECIAL TESTS:  Deferred due to fusion   FUNCTIONAL TESTS:  NT   GAIT: Distance walked: 67ft x2 Assistive device utilized: None Level of assistance: Complete Independence Comments: antalgic     HOME EXERCISE PROGRAM: Access Code: [email or web address] URL: [email or web address] Date: 01/31/2023 Prepared by: Gustavus Bryant  Exercises - Supine Posterior Pelvic Tilt  - 2 x daily - 5 x weekly - 1 sets - 10 reps - 3s hold - Supine 90/90 Alternating Heel Touches with Posterior Pelvic Tilt  - 2 x daily - 5 x weekly - 1 sets - 2 reps - 30s hold - Supine 90/90 Abdominal Bracing  - 2 x daily - 5 x weekly - 1 sets - 2 reps - 30s hold - Hooklying Single Leg Bent Knee Fallouts with Resistance  - 2 x daily - 5 x weekly - 3 sets - 15 reps - Clamshell with Resistance  - 1 x daily - 5 x weekly - 2 sets - 15 reps   [facility] Adult PT Treatment:                                                DATE: 02/21/23 Pt seen for aquatic therapy today.  Treatment took place in water 3.5-4.75 ft in depth at the [facility] pool. Temp of water was 91.  Pt entered/exited the pool via stairs independently with bilat rail. - walking without support, forward/backward  - side stepping with arm addct with rainbow hand floats - 3 laps - walking/marching with bilat rainbow hand floats  under water at side;  repeated with single hand float under water -cues for neutral spine - staggered stance with kick board row, adjusting speed accordingly 10 x 2 - holding noodle:  hip abdct/ addct x 10 each; straight leg swings into hip flexion/ext x 10 (cues to control height of LEs) -seated on noodle "like swing" for seated balance challenge with single hand out of water  - straddling yellow noodle and cycling with doggie paddle arms in deepest water - multiple laps, cc ski, jumping jack legs  Pt requires the buoyancy and hydrostatic pressure of water for support, and to offload joints by unweighting joint load by at least 50 % in navel deep water and by at least 75-80% in chest to neck deep water.  Viscosity of the water is needed for resistance of strengthening. Water current perturbations provides challenge to standing balance requiring increased core activation.   [facility] Adult PT Treatment:                                                DATE: 02/16/23 Pt seen for aquatic therapy today.  Treatment took place in water 3.5-4.75 ft in depth at the [facility] pool. Temp of water was 91.  Pt entered/exited the pool via stairs independently with bilat rail. - walking unsupported forward/backward  - side  stepping with/ without barbell - forward, back and side stepping ,core engagement with rainbow HB submerged to sides -left side bending resisted with rainbow HB, rue resting on kick board - suspended sup with nek doodle and noodles: hip extension; knee flex.  Isometric hold glut in end range hip ext -hip hiking on bottom step x10-12 R/L - TrA set with short blue noodle pull down to thighs in wide stance then staggered   Pt requires the buoyancy and hydrostatic pressure of water for support, and to offload joints by unweighting joint load by at least 50 % in navel deep water and by at least 75-80% in chest to neck deep water.  Viscosity of the water is needed for resistance of  strengthening. Water current perturbations provides challenge to standing balance requiring increased core activation.  [facility] Adult PT Treatment:                                                DATE: 02/14/23 Pt seen for aquatic therapy today.  Treatment took place in water 3.5-4.75 ft in depth at the [facility] pool. Temp of water was 91.  Pt entered/exited the pool via stairs independently with bilat rail. - walking with UE on barbell, forward/backward  - side stepping with/ without barbell - side stepping with arm addct with rainbow hand floats - 3 laps - walking/marching with bilat rainbow hand floats under water at side;  repeated with single hand float under water - staggered stance with kick board row, adjusting speed accordingly 10 x 2 - TrA set with short blue noodle pull down to thighs - straddling yellow noodle and cycling with doggie paddle arms in deepest water - multiple laps, cc ski, jumping jack legs -seated on noodle "like swing" for seated balance challenge   Pt requires the buoyancy and hydrostatic pressure of water for support, and to offload joints by unweighting joint load by at least 50 % in navel deep water and by at least 75-80% in chest to neck deep water.  Viscosity of the water is needed for resistance of strengthening. Water current perturbations provides challenge to standing balance requiring increased core activation.   [facility] Adult PT Treatment:                                                DATE: 01/31/23 Therapeutic Exercise: Nustep L4 8 min Seated latissimus press 3s 15x  Seated lat press with FAQs 15/15 Seated lat press with alt. marching 15/15 L hip flexor stretch 30s x3 Supine heel taps 30s x2 R S/L clams  Supine hip fallouts RTB 15x B, 15/15 unilaterally   [facility] Adult PT Treatment:                                                DATE: 01/24/23 Therapeutic Exercise: Nustep L2 6 min Supine hip fallouts BluTB 15x B 15/15 unilaterally S/L L  clamshell 15x manual resistance L hip flexor stretch with bolster 30s x2 90/90 30s x2 2# Manual Therapy: Skilled palpation to identify taught bands to L TFL muscle in R S/L  position Trigger Point Dry Needling Treatment: Pre-treatment instruction: Patient instructed on dry needling rationale, procedures, and possible side effects including pain during treatment (achy,cramping feeling), bruising, drop of blood, lightheadedness, nausea, sweating. Patient Consent Given: Yes Education handout provided: Previously provided Muscles treated: L TFL  Needle size and number: .30x67mm x 1 Electrical stimulation performed: No Parameters: N/A Treatment response/outcome: Twitch response elicited, Palpable decrease in muscle tension, and decrease TTP Post-treatment instructions: Patient instructed to expect possible mild to moderate muscle soreness later today and/or tomorrow. Patient instructed in methods to reduce muscle soreness and to continue prescribed HEP. If patient was dry needled over the lung field, patient was instructed on signs and symptoms of pneumothorax and, however unlikely, to see immediate medical attention should they occur. Patient was also educated on signs and symptoms of infection and to seek medical attention should they occur. Patient verbalized understanding of these instructions and education.    [facility] Adult PT Treatment:                                                DATE: 01/20/23 Therapeutic Exercise: Nustep L2 6 min Supine hip fallouts GTB 15x B 15/15 unilaterally S/L L clamshell GTB 15x L hip flexor stretch with bolster 30s x2 90/90 30s x2 Manual Therapy: Skilled palpation to identify taught bands to L piriformis muscle in R S/L position Trigger Point Dry Needling Treatment: Pre-treatment instruction: Patient instructed on dry needling rationale, procedures, and possible side effects including pain during treatment (achy,cramping feeling), bruising, drop of blood,  lightheadedness, nausea, sweating. Patient Consent Given: Yes Education handout provided: Previously provided Muscles treated: L piriformis  Needle size and number: .30x64mm x 1 Electrical stimulation performed: No Parameters: N/A Treatment response/outcome: Twitch response elicited and Palpable decrease in muscle tension Post-treatment instructions: Patient instructed to expect possible mild to moderate muscle soreness later today and/or tomorrow. Patient instructed in methods to reduce muscle soreness and to continue prescribed HEP. If patient was dry needled over the lung field, patient was instructed on signs and symptoms of pneumothorax and, however unlikely, to see immediate medical attention should they occur. Patient was also educated on signs and symptoms of infection and to seek medical attention should they occur. Patient verbalized understanding of these instructions and education.   [facility] Adult PT Treatment:                                                DATE: 01/17/23 Therapeutic Exercise: Nustep L1 6 min Seated latissimus press 3s hold 15x Seated lat press with alternating LAQs 15/15 Seated lat press with alternating marches 10/10 Seated hip toss 0# 10/10 Seated V's 0# 10/10 Seated chops 0# 10/10 TTP at L piriformis   [facility] Adult PT Treatment:                                                DATE: 01/13/23 Aquatic therapy at [facility] [address] - therapeutic pool temp 92 degrees Pt enters building independently.  Treatment took place in water 3.8 to  4 ft 8 in.feet deep depending upon activity.  Pt entered and exited the pool via stair and handrails    Aquatic Therapy:  Water walking for warm up fwd/lat/bkwds  Standing: Walking march with rainbow DB alternating push/pull Holding onto barbell: 3 way hip x10 each BIL Leg extended hip flex/ext x20 BIL Hip abd/add x20 BIL Squats x20 Heel raises - x20 Green noodle stomp x20 ea (CGA) Sitting on submerged bench: LAQ  x1' Bicycle kick x1' Scissor kick x1' Kickboard push/pull x1' Kickboard push down x1' NOT TODAY Lunge fwd x20 Lunge lateral x20 Step up and overs on submerged step 2x10 BIL Kickboard push down - 10x Kickboard - push/pull - 20x  Standing march   [facility] Adult PT Treatment:                                                DATE: 01-11-23 Aquatic therapy at [facility] [address] - therapeutic pool temp 92 degrees Pt enters building independently.  Treatment took place in water 3.8 to  4 ft 8 in.feet deep depending upon activity.  Pt entered and exited the pool via stair and handrails   Pain at initiation of RX session 6/10  at end  3/10 Aquatic Therapy:  Water walking for warm up fwd/sideways/backward Ms Flannery was educated on  beneficial therapeutic effects of water while ambulating to acclimate to water walking forward, backward and side stepping.  Pt educated on neutral posture and hip hinging in seated position with water at chest level x 10 with stretch to low back and then x 10 with back at pool wall at external cue, VC for neck tucked to prevent hyperextension.  Standing: Walking march with kickboard push down alternating Leg extended hip flex/ext x20 BIL  Hip abd/add x20 BIL Squats x20 Lunge to target x 15 Gentle trunk rotation with pool noodle Heel raises - x20 Hip CCW and CW x 10 BIL Runners Stretch x 30" x 2 BIL Hamstring stretch x 30" x 2 BIL Step ups on submerged step x10 BIL fwd/lat Sitting on submerged bench: LAQ x1' Bicycle kick x1' Scissor kick x1' [place], Pt with lumbar belt around hips and nek doodle for neck support.   Pt assisted into supine floating position by lying head on shoulder of PT to get into floating position. . PT at torso and assisting with trunk left to right and vice versa to engage trunk muscles. PT then rotated trunk in order to engage abdominal (internal and external obliques) Emphasis on breathing techniques to draw in abdominals for  support.  Pt then utilizing posterior chain and engaging Hip extension and knee flexion with water resistance while PT used Aqua stretch techniques to decrease muscle tension in low back.    ASSESSMENT:   CLINICAL IMPRESSION: Adjusted intensity of exercises back to session prior to 02/16/23, since she had pain with hip hiking and resisted side bending.  Pt given cues for vertical spine while in water, vs Rt lean.  Good tolerance to session; reported reduction of pain to 0/10 at end of session.   Plan to update aquatic HEP prior to next visit.  Pt is nearing end of approved visits; will have final visit on land with PT.    OBJECTIVE IMPAIRMENTS: Abnormal gait, decreased activity tolerance, decreased balance, decreased mobility, difficulty walking, decreased ROM, decreased strength, impaired perceived functional ability, impaired flexibility, improper body mechanics, postural dysfunction, and  pain.    ACTIVITY LIMITATIONS: carrying, lifting, bending, standing, squatting, and stairs   PERSONAL FACTORS: Age, Fitness, Past/current experiences, Time since onset of injury/illness/exacerbation, and 1 comorbidity: RA  are also affecting patient's functional outcome.    REHAB POTENTIAL: Good   CLINICAL DECISION MAKING: Evolving/moderate complexity   EVALUATION COMPLEXITY: Moderate     GOALS: Goals reviewed with patient? Yes   SHORT TERM GOALS: Target date: 12/28/2022     Begin aquatic program. Baseline: TBD Goal status: MET Initiated 01/05/23   2.  Patient to demonstrate independence in HEP  Baseline: [id number] Goal status: Met 01/17/23 Reviewed with patient on 01/06/23     LONG TERM GOALS: Target date: POC     Increase trunk and core strength to 4/5 Baseline: 3/5 Goal status: Progressing   2.  Increase FOTO score to 44 Baseline: 32; 01/17/23 44  Goal status: Met   3.  Patient to perform 5x STS with arms crossed in <20s Baseline: TBD Goal status: Ongoing   4.  Decrease worst pain  to 6/10 Baseline: 8/10 worst pain Goal status: Ongoing     PLAN:   PT FREQUENCY: 2x/week   PT DURATION: 3 weeks   PLANNED INTERVENTIONS: Therapeutic exercises, Therapeutic activity, Neuromuscular re-education, Balance training, Gait training, Patient/Family education, Self Care, Joint mobilization, Stair training, Aquatic Therapy, Dry Needling, Manual therapy, and Re-evaluation.   PLAN FOR NEXT SESSION: finalize HEPs and assess goals prior to d/c.   Mayer Camel, PTA 02/21/23 12:44 PM [facility] [address] [place], [place], [address] Phone: [phone]   Fax:  [phone]

## 2023-02-23 ENCOUNTER — Encounter (HOSPITAL_BASED_OUTPATIENT_CLINIC_OR_DEPARTMENT_OTHER): Payer: Self-pay

## 2023-02-23 ENCOUNTER — Ambulatory Visit (HOSPITAL_BASED_OUTPATIENT_CLINIC_OR_DEPARTMENT_OTHER): Payer: Medicare Other | Admitting: Physical Therapy

## 2023-02-23 ENCOUNTER — Ambulatory Visit
Admission: RE | Admit: 2023-02-23 | Discharge: 2023-02-23 | Disposition: A | Discharge status: Home / Self Care | Payer: Medicare Other | Source: Ambulatory Visit

## 2023-02-23 DIAGNOSIS — Z1231 Encounter for screening mammogram for malignant neoplasm of breast: Secondary | ICD-10-CM

## 2023-03-02 ENCOUNTER — Inpatient Hospital Stay: Admission: RE | Admit: 2023-03-02 | Payer: Medicare Other | Source: Ambulatory Visit

## 2023-03-02 ENCOUNTER — Ambulatory Visit (HOSPITAL_BASED_OUTPATIENT_CLINIC_OR_DEPARTMENT_OTHER): Payer: Medicare Other | Admitting: Physical Therapy

## 2023-03-03 ENCOUNTER — Inpatient Hospital Stay: Admission: RE | Admit: 2023-03-03 | Payer: Medicare Other | Source: Ambulatory Visit

## 2023-03-03 ENCOUNTER — Ambulatory Visit
Admission: RE | Admit: 2023-03-03 | Discharge: 2023-03-03 | Disposition: A | Discharge status: Home / Self Care | Payer: Medicare Other | Source: Ambulatory Visit | Attending: Family Medicine | Admitting: Family Medicine

## 2023-03-03 DIAGNOSIS — E2839 Other primary ovarian failure: Secondary | ICD-10-CM

## 2023-03-20 ENCOUNTER — Ambulatory Visit (HOSPITAL_BASED_OUTPATIENT_CLINIC_OR_DEPARTMENT_OTHER): Payer: Medicare Other | Attending: Physician Assistant | Admitting: Physical Therapy

## 2023-03-20 ENCOUNTER — Encounter (HOSPITAL_BASED_OUTPATIENT_CLINIC_OR_DEPARTMENT_OTHER): Payer: Self-pay | Admitting: Physical Therapy

## 2023-03-20 DIAGNOSIS — M5459 Other low back pain: Secondary | ICD-10-CM | POA: Diagnosis present

## 2023-03-20 DIAGNOSIS — M069 Rheumatoid arthritis, unspecified: Secondary | ICD-10-CM | POA: Insufficient documentation

## 2023-03-20 DIAGNOSIS — M4326 Fusion of spine, lumbar region: Secondary | ICD-10-CM | POA: Insufficient documentation

## 2023-03-20 NOTE — Therapy (Addendum)
OUTPATIENT PHYSICAL THERAPY TREATMENT NOTE/DC SUMMARY   Patient Name: Tanya Duncan MRN: [id number] DOB:[birth year], 77 y.o., female Today's Date: 03/20/2023  PCP: Tracey Harries, MD    REFERRING PROVIDER: Jordan Hawks, PA-C  PHYSICAL THERAPY DISCHARGE SUMMARY  Visits from Start of Care: 13  Current functional level related to goals / functional outcomes: Goals partially met   Remaining deficits: pain   Education / Equipment: HEP   Patient agrees to discharge. Patient goals were partially met. Patient is being discharged due to maximized rehab potential.    PT End of Session - 03/20/23 1000     Visit Number 13    Number of Visits 16    Date for PT Re-Evaluation 04/07/23    Authorization Type MCR    PT Start Time 0955   pt arrived late   PT Stop Time 1030    PT Time Calculation (min) 35 min    Activity Tolerance Patient tolerated treatment well    Behavior During Therapy [facility] for tasks assessed/performed                 Past Medical History:  Diagnosis Date   Anxiety    Arthritis    Blood transfusion without reported diagnosis 05/2019   with hip surgery   Fibromyalgia    Fractured pelvis (HCC) 09/15/2020   right   GERD (gastroesophageal reflux disease)    GI bleed 4098,1191   x2   Hyperlipidemia    Pneumonia 2005   Stomach ulcer 1990   Past Surgical History:  Procedure Laterality Date   ABDOMINAL HYSTERECTOMY     FRACTURE SURGERY Right 1998   ankle   JOINT REPLACEMENT     LUMBAR FUSION  2015   OPEN REDUCTION INTERNAL FIXATION (ORIF) PROXIMAL PHALANX Right 09/21/2020   Procedure: OPEN TREATMENT OF RIGHT LONG FINGER PROXIMAL PHALANX FRACTURE;  Surgeon: Mack Hook, MD;  Location: [facility] OR;  Service: Orthopedics;  Laterality: Right;  LENGTH OF SURGERY: 75 MIN   ORIF HUMERUS FRACTURE Left    ORIF TIBIA FRACTURE Right 1998   with bone graft   PYLOROPLASTY     REVERSE SHOULDER ARTHROPLASTY Left 02/04/2021   Procedure: REVERSE SHOULDER ARTHROPLASTY  WITH HARDWARE REMOVAL;  Surgeon: Jones Broom, MD;  Location: [facility] ORS;  Service: Orthopedics;  Laterality: Left;   TOTAL HIP ARTHROPLASTY  2011   TOTAL HIP ARTHROPLASTY Right 05/14/2019   Procedure: Right Anterior Hip Arthroplasty;  Surgeon: Marcene Corning, MD;  Location: [facility] ORS;  Service: Orthopedics;  Laterality: Right;   TOTAL SHOULDER REPLACEMENT  2014   Patient Active Problem List   Diagnosis Date Noted   Palpitations 09/01/2020   Chest pain 09/01/2020   Abnormal ECG 09/01/2020   Moderate episode of recurrent major depressive disorder (HCC) 06/22/2020   Prediabetes 06/22/2020   Mixed hyperlipidemia 06/22/2020   History of total right hip arthroplasty 05/17/2019   Anxiety    Primary osteoarthritis of right hip 05/14/2019   Vitamin D deficiency 01/10/2019   Insomnia due to anxiety and fear 10/25/2017   Chronic prescription benzodiazepine use 10/25/2017   Chronic bilateral low back pain without sciatica 04/27/2017   Osteoarthritis 01/06/2017   GAD (generalized anxiety disorder) 01/06/2017   Rheumatoid arthritis involving multiple sites with positive rheumatoid factor (HCC) 12/12/2016   Status post lumbar spinal fusion 12/12/2016   Fibromyalgia 12/12/2016    THERAPY DIAG:  Chronic low back pain   S/P lumbar fusion    Rationale for Evaluation and Treatment Rehabilitation  REFERRING DIAG: M05.79 (  ICD-10-CM) - Rheumatoid arthritis with rheumatoid factor of multiple sites without organ or systems involvement M54.50 (ICD-10-CM) - Low back pain, unspecified G89.29 (ICD-10-CM) - Other chronic pain   PERTINENT HISTORY: Lumbar fusion and foraminotomy 10 years ago, B THA, hx of L TSA reverse   PRECAUTIONS/RESTRICTIONS:   lumbar fusion, RA  SUBJECTIVE: Pt reports she has been [facility], but hasn't been consistent with her land exercises.  She would like to follow up with her land PT.      PAIN:  Are you having pain? Yes: NPRS scale: 6/10 Pain  location: Lt lateral trunk  Pain description: ache  Aggravating factors: activity Relieving factors: meds and thermal modalities  OBJECTIVE: (objective measures completed at initial evaluation unless otherwise dated)  DIAGNOSTIC FINDINGS:  CLINICAL DATA:  Sacral pain since the patient suffered a fall when she was pulled down by her dog 09/08/2020.   EXAM: MRI LUMBAR SPINE WITHOUT CONTRAST   TECHNIQUE: Multiplanar, multisequence MR imaging of the lumbar spine was performed. No intravenous contrast was administered.   COMPARISON:  CT of the right hip 10/20/2020. MRI of the right hip 02/01/2020.   FINDINGS: Bones/Joint/Cartilage   There is marrow edema throughout the left sacrum due to a mildly displaced fracture. A small focus of marrow edema is seen in the left ilium adjacent to the SI joint consistent with nondisplaced fracture. Also seen is a mild inferior endplate compression fracture of L5 eccentric to the right with vertebral body height loss of approximately 10% and associated marrow edema.   There is partial visualization of lumbar fusion hardware extending from L4 above the superior aspect of the scan. Artifact from bilateral hip replacements also noted. No worrisome lesion is identified.   Ligaments   Negative.   Muscles and Tendons   Intact.   Soft tissues   Sigmoid diverticulosis noted.   IMPRESSION: Acute or subacute minimally displaced fracture of the left sacrum. There is also a small fracture in the left ilium adjacent to the SI joint.   Acute or subacute mild inferior endplate compression fracture L5.   Diverticulosis.     Electronically Signed   By: Drusilla Kanner M.D.   On: 12/16/2020 09:57   PATIENT SURVEYS:  FOTO 32(44 predicted); 01/17/23 44   SCREENING FOR RED FLAGS: Bowel or bladder incontinence: No   COGNITION: Overall cognitive status: Within functional limits for tasks assessed                          SENSATION: Not  tested   MUSCLE LENGTH: Hamstrings: Right 90 deg; Left 90 deg     POSTURE: decreased lumbar lordosis and side bent R with R knee flexed  in valgus with pronated R ankle   PALPATION: NT   LUMBAR ROM:    AROM eval  Flexion 75%  Extension 25%  Right lateral flexion 25%  Left lateral flexion 25%  Right rotation 90%  Left rotation 75%   (Blank rows = not tested)   LOWER EXTREMITY ROM:   WFL   Active  Right eval Left eval  Hip flexion      Hip extension      Hip abduction      Hip adduction      Hip internal rotation      Hip external rotation      Knee flexion      Knee extension      Ankle dorsiflexion  Ankle plantarflexion      Ankle inversion      Ankle eversion       (Blank rows = not tested)   LOWER EXTREMITY MMT:  [facility]   MMT Right eval Left eval  Hip flexion      Hip extension      Hip abduction      Hip adduction      Hip internal rotation      Hip external rotation      Knee flexion      Knee extension      Ankle dorsiflexion      Ankle plantarflexion      Ankle inversion      Ankle eversion      Core/trunk 3 3   (Blank rows = not tested)   LUMBAR SPECIAL TESTS:  Deferred due to fusion   FUNCTIONAL TESTS:  NT   GAIT: Distance walked: 51ft x2 Assistive device utilized: None Level of assistance: Complete Independence Comments: antalgic     HOME EXERCISE PROGRAM: Access Code: [email or web address] URL: [email or web address] Date: 01/31/2023 Prepared by: Gustavus Bryant  Exercises - Supine Posterior Pelvic Tilt  - 2 x daily - 5 x weekly - 1 sets - 10 reps - 3s hold - Supine 90/90 Alternating Heel Touches with Posterior Pelvic Tilt  - 2 x daily - 5 x weekly - 1 sets - 2 reps - 30s hold - Supine 90/90 Abdominal Bracing  - 2 x daily - 5 x weekly - 1 sets - 2 reps - 30s hold - Hooklying Single Leg Bent Knee Fallouts with Resistance  - 2 x daily - 5 x weekly - 3 sets - 15 reps - Clamshell with Resistance  - 1 x daily - 5 x weekly - 2  sets - 15 reps  AQUATIC Access Code: [email or web address] URL: [email or web address] Date: 03/20/2023 Prepared by: [facility] Adult PT Treatment:                                                DATE: 03/20/23 Pt seen for aquatic therapy today.  Treatment took place in water 3.5-4.75 ft in depth at the [facility] pool. Temp of water was 91.  Pt entered/exited the pool via stairs independently with bilat rail. - side stepping with arm addct with rainbow hand floats - 3 laps - walking with alternating UE row with forward/backward ;  with rainbow hand floats under water at side, then with only left under water, cues for neutral spine - staggered stance with kick board row, adjusting speed accordingly 10 x 2 -seated on noodle "like swing" for seated balance challenge with single hand out of water  - holding noodle:  hip abdct/ addct x 10 each; straight leg swings into hip flexion/ext x 10 (cues to control height of LEs); single leg clams x 10 - straddling yellow noodle and cycling with doggie paddle arms in deepest water   Pt requires the buoyancy and hydrostatic pressure of water for support, and to offload joints by unweighting joint load by at least 50 % in navel deep water and by at least 75-80% in chest to neck deep water.  Viscosity of the water is needed for resistance of strengthening. Water current perturbations provides challenge to standing balance requiring increased core activation.  [facility] Adult PT  Treatment:                                                DATE: 02/21/23 Pt seen for aquatic therapy today.  Treatment took place in water 3.5-4.75 ft in depth at the [facility] pool. Temp of water was 91.  Pt entered/exited the pool via stairs independently with bilat rail. - walking without support, forward/backward  - side stepping with arm addct with rainbow hand floats - 3 laps - walking/marching with bilat rainbow hand floats  under water at side;  repeated with single hand float under water -cues for neutral spine - staggered stance with kick board row, adjusting speed accordingly 10 x 2 - holding noodle:  hip abdct/ addct x 10 each; straight leg swings into hip flexion/ext x 10 (cues to control height of LEs) -seated on noodle "like swing" for seated balance challenge with single hand out of water  - straddling yellow noodle and cycling with doggie paddle arms in deepest water - multiple laps, cc ski, jumping jack legs  Pt requires the buoyancy and hydrostatic pressure of water for support, and to offload joints by unweighting joint load by at least 50 % in navel deep water and by at least 75-80% in chest to neck deep water.  Viscosity of the water is needed for resistance of strengthening. Water current perturbations provides challenge to standing balance requiring increased core activation.   [facility] Adult PT Treatment:                                                DATE: 02/16/23 Pt seen for aquatic therapy today.  Treatment took place in water 3.5-4.75 ft in depth at the [facility] pool. Temp of water was 91.  Pt entered/exited the pool via stairs independently with bilat rail. - walking unsupported forward/backward  - side stepping with/ without barbell - forward, back and side stepping ,core engagement with rainbow HB submerged to sides -left side bending resisted with rainbow HB, rue resting on kick board - suspended sup with nek doodle and noodles: hip extension; knee flex.  Isometric hold glut in end range hip ext -hip hiking on bottom step x10-12 R/L - TrA set with short blue noodle pull down to thighs in wide stance then staggered   Pt requires the buoyancy and hydrostatic pressure of water for support, and to offload joints by unweighting joint load by at least 50 % in navel deep water and by at least 75-80% in chest to neck deep water.  Viscosity of the water is needed for resistance of  strengthening. Water current perturbations provides challenge to standing balance requiring increased core activation.  [facility] Adult PT Treatment:                                                DATE: 02/14/23 Pt seen for aquatic therapy today.  Treatment took place in water 3.5-4.75 ft in depth at the [facility] pool. Temp of water was 91.  Pt entered/exited the pool via stairs independently with bilat rail. - walking with UE  on barbell, forward/backward  - side stepping with/ without barbell - side stepping with arm addct with rainbow hand floats - 3 laps - walking/marching with bilat rainbow hand floats under water at side;  repeated with single hand float under water - staggered stance with kick board row, adjusting speed accordingly 10 x 2 - TrA set with short blue noodle pull down to thighs - straddling yellow noodle and cycling with doggie paddle arms in deepest water - multiple laps, cc ski, jumping jack legs -seated on noodle "like swing" for seated balance challenge   Pt requires the buoyancy and hydrostatic pressure of water for support, and to offload joints by unweighting joint load by at least 50 % in navel deep water and by at least 75-80% in chest to neck deep water.  Viscosity of the water is needed for resistance of strengthening. Water current perturbations provides challenge to standing balance requiring increased core activation.   [facility] Adult PT Treatment:                                                DATE: 01/31/23 Therapeutic Exercise: Nustep L4 8 min Seated latissimus press 3s 15x  Seated lat press with FAQs 15/15 Seated lat press with alt. marching 15/15 L hip flexor stretch 30s x3 Supine heel taps 30s x2 R S/L clams  Supine hip fallouts RTB 15x B, 15/15 unilaterally   [facility] Adult PT Treatment:                                                DATE: 01/24/23 Therapeutic Exercise: Nustep L2 6 min Supine hip fallouts BluTB 15x B 15/15 unilaterally S/L L  clamshell 15x manual resistance L hip flexor stretch with bolster 30s x2 90/90 30s x2 2# Manual Therapy: Skilled palpation to identify taught bands to L TFL muscle in R S/L position Trigger Point Dry Needling Treatment: Pre-treatment instruction: Patient instructed on dry needling rationale, procedures, and possible side effects including pain during treatment (achy,cramping feeling), bruising, drop of blood, lightheadedness, nausea, sweating. Patient Consent Given: Yes Education handout provided: Previously provided Muscles treated: L TFL  Needle size and number: .30x88mm x 1 Electrical stimulation performed: No Parameters: N/A Treatment response/outcome: Twitch response elicited, Palpable decrease in muscle tension, and decrease TTP Post-treatment instructions: Patient instructed to expect possible mild to moderate muscle soreness later today and/or tomorrow. Patient instructed in methods to reduce muscle soreness and to continue prescribed HEP. If patient was dry needled over the lung field, patient was instructed on signs and symptoms of pneumothorax and, however unlikely, to see immediate medical attention should they occur. Patient was also educated on signs and symptoms of infection and to seek medical attention should they occur. Patient verbalized understanding of these instructions and education.    [facility] Adult PT Treatment:                                                DATE: 01/20/23 Therapeutic Exercise: Nustep L2 6 min Supine hip fallouts GTB 15x B 15/15 unilaterally S/L L clamshell GTB 15x L hip flexor  stretch with bolster 30s x2 90/90 30s x2 Manual Therapy: Skilled palpation to identify taught bands to L piriformis muscle in R S/L position Trigger Point Dry Needling Treatment: Pre-treatment instruction: Patient instructed on dry needling rationale, procedures, and possible side effects including pain during treatment (achy,cramping feeling), bruising, drop of blood,  lightheadedness, nausea, sweating. Patient Consent Given: Yes Education handout provided: Previously provided Muscles treated: L piriformis  Needle size and number: .30x68mm x 1 Electrical stimulation performed: No Parameters: N/A Treatment response/outcome: Twitch response elicited and Palpable decrease in muscle tension Post-treatment instructions: Patient instructed to expect possible mild to moderate muscle soreness later today and/or tomorrow. Patient instructed in methods to reduce muscle soreness and to continue prescribed HEP. If patient was dry needled over the lung field, patient was instructed on signs and symptoms of pneumothorax and, however unlikely, to see immediate medical attention should they occur. Patient was also educated on signs and symptoms of infection and to seek medical attention should they occur. Patient verbalized understanding of these instructions and education.   [facility] Adult PT Treatment:                                                DATE: 01/17/23 Therapeutic Exercise: Nustep L1 6 min Seated latissimus press 3s hold 15x Seated lat press with alternating LAQs 15/15 Seated lat press with alternating marches 10/10 Seated hip toss 0# 10/10 Seated V's 0# 10/10 Seated chops 0# 10/10 TTP at L piriformis   [facility] Adult PT Treatment:                                                DATE: 01/13/23 Aquatic therapy at [facility] [address] - therapeutic pool temp 92 degrees Pt enters building independently.  Treatment took place in water 3.8 to  4 ft 8 in.feet deep depending upon activity.  Pt entered and exited the pool via stair and handrails    Aquatic Therapy:  Water walking for warm up fwd/lat/bkwds  Standing: Walking march with rainbow DB alternating push/pull Holding onto barbell: 3 way hip x10 each BIL Leg extended hip flex/ext x20 BIL Hip abd/add x20 BIL Squats x20 Heel raises - x20 Green noodle stomp x20 ea (CGA) Sitting on submerged bench: LAQ  x1' Bicycle kick x1' Scissor kick x1' Kickboard push/pull x1' Kickboard push down x1' NOT TODAY Lunge fwd x20 Lunge lateral x20 Step up and overs on submerged step 2x10 BIL Kickboard push down - 10x Kickboard - push/pull - 20x  Standing march   [facility] Adult PT Treatment:                                                DATE: 01-11-23 Aquatic therapy at [facility] [address] - therapeutic pool temp 92 degrees Pt enters building independently.  Treatment took place in water 3.8 to  4 ft 8 in.feet deep depending upon activity.  Pt entered and exited the pool via stair and handrails   Pain at initiation of RX session 6/10  at end  3/10 Aquatic Therapy:  Water walking for warm up  fwd/sideways/backward Ms Semans was educated on  beneficial therapeutic effects of water while ambulating to acclimate to water walking forward, backward and side stepping.  Pt educated on neutral posture and hip hinging in seated position with water at chest level x 10 with stretch to low back and then x 10 with back at pool wall at external cue, VC for neck tucked to prevent hyperextension.  Standing: Walking march with kickboard push down alternating Leg extended hip flex/ext x20 BIL  Hip abd/add x20 BIL Squats x20 Lunge to target x 15 Gentle trunk rotation with pool noodle Heel raises - x20 Hip CCW and CW x 10 BIL Runners Stretch x 30" x 2 BIL Hamstring stretch x 30" x 2 BIL Step ups on submerged step x10 BIL fwd/lat Sitting on submerged bench: LAQ x1' Bicycle kick x1' Scissor kick x1' [place], Pt with lumbar belt around hips and nek doodle for neck support.   Pt assisted into supine floating position by lying head on shoulder of PT to get into floating position. . PT at torso and assisting with trunk left to right and vice versa to engage trunk muscles. PT then rotated trunk in order to engage abdominal (internal and external obliques) Emphasis on breathing techniques to draw in abdominals for  support.  Pt then utilizing posterior chain and engaging Hip extension and knee flexion with water resistance while PT used Aqua stretch techniques to decrease muscle tension in low back.    ASSESSMENT:   CLINICAL IMPRESSION: Pt has been away from therapy for 1 month.  Reviewed aquatic exercises with good tolerance and issued laminated HEP (to add to previously issued aquatic HEP).  Pt is nearing end of approved visits; will have final visit on land with PT.  Discussed need for consistency with both land/ water HEPs for continued success; pt verbalized understanding.  Pt has partially met her goals.    OBJECTIVE IMPAIRMENTS: Abnormal gait, decreased activity tolerance, decreased balance, decreased mobility, difficulty walking, decreased ROM, decreased strength, impaired perceived functional ability, impaired flexibility, improper body mechanics, postural dysfunction, and pain.    ACTIVITY LIMITATIONS: carrying, lifting, bending, standing, squatting, and stairs   PERSONAL FACTORS: Age, Fitness, Past/current experiences, Time since onset of injury/illness/exacerbation, and 1 comorbidity: RA  are also affecting patient's functional outcome.    REHAB POTENTIAL: Good   CLINICAL DECISION MAKING: Evolving/moderate complexity   EVALUATION COMPLEXITY: Moderate     GOALS: Goals reviewed with patient? Yes   SHORT TERM GOALS: Target date: 12/28/2022     Begin aquatic program. Baseline: TBD Goal status: MET Initiated 01/05/23   2.  Patient to demonstrate independence in HEP  Baseline: [id number] Goal status: Met 01/17/23 Reviewed with patient on 01/06/23     LONG TERM GOALS: Target date: POC     Increase trunk and core strength to 4/5 Baseline: 3/5 Goal status: Progressing   2.  Increase FOTO score to 44 Baseline: 32; 01/17/23 44  Goal status: Met   3.  Patient to perform 5x STS with arms crossed in <20s Baseline: TBD Goal status: Ongoing   4.  Decrease worst pain to 6/10 Baseline:  8/10 worst pain Goal status: Ongoing     PLAN:   PT FREQUENCY: 2x/week   PT DURATION: 3 weeks   PLANNED INTERVENTIONS: Therapeutic exercises, Therapeutic activity, Neuromuscular re-education, Balance training, Gait training, Patient/Family education, Self Care, Joint mobilization, Stair training, Aquatic Therapy, Dry Needling, Manual therapy, and Re-evaluation.   PLAN FOR NEXT SESSION: finalize HEP and assess  goals prior to d/c.   Mayer Camel, PTA 03/20/23 1:09 PM [facility] [address] [place], [place], [address] Phone: [phone]   Fax:  [phone]

## 2023-04-05 ENCOUNTER — Encounter: Payer: Self-pay | Admitting: Gastroenterology

## 2023-04-27 ENCOUNTER — Emergency Department (HOSPITAL_COMMUNITY): Payer: Medicare Other

## 2023-04-27 ENCOUNTER — Emergency Department (HOSPITAL_COMMUNITY)
Admission: EM | Admit: 2023-04-27 | Discharge: 2023-04-27 | Disposition: A | Discharge status: Home / Self Care | Payer: Medicare Other | Attending: Emergency Medicine | Admitting: Emergency Medicine

## 2023-04-27 ENCOUNTER — Other Ambulatory Visit: Payer: Self-pay

## 2023-04-27 DIAGNOSIS — S42202A Unspecified fracture of upper end of left humerus, initial encounter for closed fracture: Secondary | ICD-10-CM | POA: Insufficient documentation

## 2023-04-27 DIAGNOSIS — W130XXA Fall from, out of or through balcony, initial encounter: Secondary | ICD-10-CM | POA: Insufficient documentation

## 2023-04-27 DIAGNOSIS — S4992XA Unspecified injury of left shoulder and upper arm, initial encounter: Secondary | ICD-10-CM | POA: Diagnosis present

## 2023-04-27 DIAGNOSIS — Z96612 Presence of left artificial shoulder joint: Secondary | ICD-10-CM | POA: Diagnosis not present

## 2023-04-27 MED ORDER — OXYCODONE-ACETAMINOPHEN 5-325 MG PO TABS: 1.0000 | ORAL_TABLET | Freq: Four times a day (QID) | ORAL | 0 refills | Status: DC | PRN

## 2023-04-27 MED ORDER — ONDANSETRON 4 MG PO TBDP
4.0000 mg | ORAL_TABLET | Freq: Once | ORAL | Status: AC
  Administered 2023-04-27: 4 mg via ORAL
  Filled 2023-04-27: qty 1

## 2023-04-27 MED ORDER — HYDROMORPHONE HCL 1 MG/ML IJ SOLN
1.0000 mg | Freq: Once | INTRAMUSCULAR | Status: AC
  Administered 2023-04-27: 1 mg via INTRAVENOUS
  Filled 2023-04-27: qty 1

## 2023-04-27 MED ORDER — MORPHINE SULFATE (PF) 4 MG/ML IV SOLN
4.0000 mg | Freq: Once | INTRAVENOUS | Status: AC
  Administered 2023-04-27: 4 mg via INTRAMUSCULAR
  Filled 2023-04-27: qty 1

## 2023-04-27 NOTE — ED Provider Notes (Signed)
[place] EMERGENCY DEPARTMENT AT [facility] Provider Note   CSN: [id number] Arrival date & time: 04/27/23  1346     History  Chief Complaint  Patient presents with   Fall   Shoulder Pain   Arm Pain    Tanya Duncan is a 77 y.o. female.  Pt is a 77 yo female with pmhx significant for anxiety, arthritis, ulcers, fibromyalgia, gerd, and hld.  Pt fell off her porch today and landed on her left shoulder.  She has had surgery for a prior humerus fx as well as a left shoulder arthroplasty done in March of 2022 by Dr. Ave Filter.  Pt denies loc or head injury.  No other pain.       Home Medications Prior to Admission medications   Medication Sig Start Date End Date Taking? Authorizing Provider  oxyCODONE-acetaminophen (PERCOCET/ROXICET) 5-325 MG tablet Take 1 tablet by mouth every 6 (six) hours as needed for severe pain. 04/27/23  Yes Jacalyn Lefevre, MD  acetaminophen (TYLENOL) 500 MG tablet Take 1,000 mg by mouth as needed.    [provider]  Ascorbic Acid (VITAMIN C) 1000 MG tablet Take 1,000 mg by mouth daily.     [provider]  b complex vitamins tablet Take 1 tablet by mouth daily.    [provider]  bismuth subsalicylate (PEPTO BISMOL) 262 MG/15ML suspension Take 30 mLs by mouth every 6 (six) hours as needed (upset stomach).    [provider]  calcium carbonate (TUMS EX) 750 MG chewable tablet Chew 1-3 tablets by mouth as needed for heartburn.    [provider]  diclofenac Sodium (VOLTAREN) 1 % GEL Place 1 application onto the skin 4 (four) times daily as needed (pain). 03/07/19   [provider]  ergocalciferol (VITAMIN D2) 1.25 MG (50000 UT) capsule Take 50,000 Units by mouth every Monday.  08/26/20   [provider]  escitalopram (LEXAPRO) 20 MG tablet Take 20 mg by mouth daily.    [provider]  famotidine (PEPCID) 40 MG tablet TAKE 1 TABLET(40 MG) BY MOUTH TWICE DAILY 01/09/23   Meryl Dare, MD  Ferrous Sulfate (IRON PO) Take 1 tablet by mouth daily as needed (low iron).    [provider]  gabapentin (NEURONTIN) 300 MG capsule Take 600 mg by mouth 2 (two) times daily.    [provider]  LORazepam (ATIVAN) 1 MG tablet Take 1 mg by mouth 2 (two) times daily.    [provider]  Melatonin 5 MG TABS Take 5 mg by mouth at bedtime as needed (Sleep).    [provider]  Multiple Vitamin (MULTIVITAMIN WITH MINERALS) TABS tablet Take 1 tablet by mouth daily.    [provider]  nortriptyline (PAMELOR) 25 MG capsule Take 100 mg by mouth at bedtime.     [provider]  ondansetron (ZOFRAN) 4 MG tablet Take 4 mg by mouth as needed. 12/01/21   [provider]  pantoprazole (PROTONIX) 40 MG tablet TAKE 1 TABLET(40 MG) BY MOUTH TWICE DAILY 09/28/22   Meryl Dare, MD  pravastatin (PRAVACHOL) 80 MG tablet Take 80 mg by mouth daily.    [provider]  Psyllium (DAILY FIBER PO) Take 3-4 capsules by mouth as needed.    [provider]  tiZANidine (ZANAFLEX) 4 MG tablet Take 1 tablet (4 mg total) by mouth every 8 (eight) hours as needed for muscle spasms. 02/04/21   Jiles Harold, PA-C  traMADol Janean Sark) 50  MG tablet Take 50-100 mg by mouth 4 (four) times daily as needed for severe pain or moderate pain. 12/01/20   [provider]  zolpidem (AMBIEN) 10 MG tablet Take 10 mg by mouth at bedtime as needed for sleep. 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   Physical Exam Updated Vital Signs BP (!) 155/106 (BP Location: Right Arm)   Pulse 95   Temp 98.1 F (36.7 C) (Oral)   Resp 18   Ht 5' 2.5"  (1.588 m)   Wt 64.4 kg   LMP  (LMP Unknown)   SpO2 100%   BMI 25.56 kg/m  Physical Exam Vitals and nursing note reviewed.  Constitutional:      Appearance: Normal appearance.  HENT:     Head: Normocephalic and atraumatic.     Right Ear: External ear normal.     Left Ear: External ear normal.     Nose: Nose normal.     Mouth/Throat:     Mouth: Mucous membranes are moist.     Pharynx: Oropharynx is clear.  Eyes:     Extraocular Movements: Extraocular movements intact.     Conjunctiva/sclera: Conjunctivae normal.     Pupils: Pupils are equal, round, and reactive to light.  Cardiovascular:     Rate and Rhythm: Normal rate and regular rhythm.     Pulses: Normal pulses.     Heart sounds: Normal heart sounds.  Pulmonary:     Effort: Pulmonary effort is normal.     Breath sounds: Normal breath sounds.  Abdominal:     General: Abdomen is flat. Bowel sounds are normal.     Palpations: Abdomen is soft.  Musculoskeletal:     Cervical back: Normal range of motion and neck supple.     Comments: Proximal humerus pain and swelling  Skin:    General: Skin is warm.     Capillary Refill: Capillary refill takes less than 2 seconds.  Neurological:     General: No focal deficit present.     Mental Status: She is alert and oriented to person, place, and time.  Psychiatric:        Mood and Affect: Mood normal.        Behavior: Behavior normal.     ED Results / Procedures / Treatments   Labs (all labs ordered are listed, but only abnormal results are displayed) Labs Reviewed - No data to display  EKG None  Radiology CT Shoulder Left Wo Contrast  Result Date: 04/27/2023 CLINICAL DATA:  Left shoulder pain after a fall. EXAM: CT OF THE UPPER LEFT EXTREMITY WITHOUT CONTRAST TECHNIQUE: Multidetector CT imaging of the upper left extremity was performed according to the standard protocol. RADIATION DOSE REDUCTION: This exam was performed according to the departmental dose-optimization  program which includes automated exposure control, adjustment of the mA and/or kV according to patient size and/or use of iterative reconstruction technique. COMPARISON:  Left shoulder and humerus x-rays from same day. CT left shoulder dated September 21, 2021. FINDINGS: Bones/Joint/Cartilage Prior reverse total shoulder arthroplasty and lateral plate and screw fixation of an old periprosthetic fracture. New acute periprosthetic fracture of the proximal humeral diaphysis with  apex anterior angulation and displacement of the proximal fixation plate, best appreciated on the sagittal reformats (series 6, images 110-112). No dislocation. Mild degenerative changes of the acromioclavicular joint. No joint effusion. Ligaments Ligaments are suboptimally evaluated by CT. Muscles and Tendons Unchanged subscapularis muscle atrophy. Soft tissue No fluid collection or hematoma. No soft tissue mass. The visualized left lung is clear. IMPRESSION: 1. New acute angulated periprosthetic fracture of the proximal humeral diaphysis. Electronically Signed   By: Obie Dredge M.D.   On: 04/27/2023 19:05   DG Humerus Left  Result Date: 04/27/2023 CLINICAL DATA:  Left arm pain after fall. EXAM: LEFT HUMERUS - 2+ VIEW COMPARISON:  September 21, 2021. FINDINGS: Status post left total shoulder arthroplasty and surgical internal fixation of old proximal left humeral fracture. There may be slightly increased angulation of the proximal left humerus which may represent old healed fracture, but acute fracture cannot be excluded. IMPRESSION: Status post left total shoulder arthroplasty and surgical internal fixation of old proximal left humeral fracture. Slightly increased angulation of proximal left humeral shaft is noted which may represent sequela of old fracture, but acute fracture cannot be excluded. CT scan may be performed for further evaluation. Electronically Signed   By: Lupita Raider M.D.   On: 04/27/2023 15:39   DG Shoulder  Left  Result Date: 04/27/2023 CLINICAL DATA:  Left shoulder pain after fall. EXAM: LEFT SHOULDER - 2+ VIEW COMPARISON:  September 21, 2021. FINDINGS: Status post left shoulder arthroplasty as well as surgical internal fixation of proximal left humeral fracture. No acute fracture or dislocation is noted. Visualized ribs are unremarkable. IMPRESSION: Postsurgical changes as described above.  No acute abnormality seen. Electronically Signed   By: Lupita Raider M.D.   On: 04/27/2023 15:33    Procedures Procedures    Medications Ordered in ED Medications  morphine (PF) 4 MG/ML injection 4 mg (4 mg Intramuscular Given 04/27/23 1737)  ondansetron (ZOFRAN-ODT) disintegrating tablet 4 mg (4 mg Oral Given 04/27/23 1738)  morphine (PF) 4 MG/ML injection 4 mg (4 mg Intramuscular Given 04/27/23 1935)    ED Course/ Medical Decision Making/ A&P                             Medical Decision Making Amount and/or Complexity of Data Reviewed Radiology: ordered.  Risk Prescription drug management.   This patient presents to the ED for concern of shoulder pain, this involves an extensive number of treatment options, and is a complaint that carries with it a high risk of complications and morbidity.  The differential diagnosis includes fx., dislocation, strain   Co morbidities that complicate the patient evaluation  nxiety, arthritis, ulcers, fibromyalgia, gerd, and hld   Additional history obtained:  Additional history obtained from epic chart review  Imaging Studies ordered:  I ordered imaging studies including left humerus, left shoulder, ct shoulder  I independently visualized and interpreted imaging which showed  Humerus: Status post left total shoulder arthroplasty and surgical internal  fixation of old proximal left humeral fracture. Slightly increased  angulation of proximal left humeral shaft is noted which may  represent sequela of old fracture, but acute fracture cannot be   excluded. CT scan may be performed for further evaluation.  Shoulder: Postsurgical changes as described above.  No acute abnormality seen.  CT shoulder: . New acute angulated periprosthetic fracture of the proximal  humeral diaphysis.   I agree with the radiologist interpretation  Cardiac Monitoring:  The patient was maintained on a cardiac monitor.  I personally viewed and interpreted the cardiac monitored which showed an underlying rhythm of: nsr   Medicines ordered and prescription drug management:  I ordered medication including morphine/zofran  for pain  Reevaluation of the patient after these medicines showed that the patient improved I have reviewed the patients home medicines and have made adjustments as needed   Test Considered:  ct   Critical Interventions:  Pain control   Consultations Obtained:  I requested consultation with the orthopedist (Dr. Ave Filter),  and discussed lab and imaging findings as well as pertinent plan -he recommends d/c home in sling.  Pt can come to the office tomorrow at 0900 to see his PA.   Problem List / ED Course:  Periprosthetic fx  proximal humerus.  Pt has a comfortable sling.  She is supposed to see the ortho PA tomorrow at 0900.  Pt is stable for d/c.  Return if worse.    Reevaluation:  After the interventions noted above, I reevaluated the patient and found that they have :improved   Social Determinants of Health:  Lives at home   Dispostion:  After consideration of the diagnostic results and the patients response to treatment, I feel that the patent would benefit from discharge with outpatient f/u.          Final Clinical Impression(s) / ED Diagnoses Final diagnoses:  Closed fracture of proximal end of left humerus, unspecified fracture morphology, initial encounter    Rx / DC Orders ED Discharge Orders          Ordered    oxyCODONE-acetaminophen (PERCOCET/ROXICET) 5-325 MG tablet  Every 6 hours PRN         04/27/23 1944              Jacalyn Lefevre, MD 04/27/23 1946

## 2023-04-27 NOTE — ED Triage Notes (Signed)
Pt. Stated, I fell off my porch , I was trying to carry too much.

## 2023-05-08 ENCOUNTER — Other Ambulatory Visit: Payer: Self-pay | Admitting: Orthopedic Surgery

## 2023-05-10 NOTE — Patient Instructions (Signed)
DUE TO COVID-19 ONLY TWO VISITORS  (aged 75 and older)  ARE ALLOWED TO COME WITH YOU AND STAY IN THE WAITING ROOM ONLY DURING PRE OP AND PROCEDURE.   **NO VISITORS ARE ALLOWED IN THE SHORT STAY AREA OR RECOVERY ROOM!!**  IF YOU WILL BE ADMITTED INTO THE HOSPITAL YOU ARE ALLOWED ONLY FOUR SUPPORT PEOPLE DURING VISITATION HOURS ONLY (7 AM -8PM)   The support person(s) must pass our screening, gel in and out, and wear a mask at all times, including in the patient's room. Patients must also wear a mask when staff or their support person are in the room. Visitors GUEST BADGE MUST BE WORN VISIBLY  One adult visitor may remain with you overnight and MUST be in the room by 8 P.M.     Your procedure is scheduled on: 05/15/23   Report to [facility] Main Entrance    Report to admitting at : 12:45 PM   Call this number if you have problems the morning of surgery [phone]   Do not eat food :After Midnight.   After Midnight you may have the following liquids until : 12:00  PM DAY OF SURGERY  Water Black Coffee (sugar ok, NO MILK/CREAM OR CREAMERS)  Tea (sugar ok, NO MILK/CREAM OR CREAMERS) regular and decaf                             Plain Jell-O (NO RED)                                           Fruit ices (not with fruit pulp, NO RED)                                     Popsicles (NO RED)                                                                  Juice: apple, WHITE grape, WHITE cranberry Sports drinks like Gatorade (NO RED)   The day of surgery:  Drink ONE (1) Pre-Surgery Clear Ensure at : 12:00 PM the morning of surgery. Drink in one sitting. Do not sip.  This drink was given to you during your hospital  pre-op appointment visit. Nothing else to drink after completing the  Pre-Surgery Clear Ensure or G2.          If you have questions, please contact your surgeon's office.   Oral Hygiene is also important to reduce your risk of infection.                                     Remember - BRUSH YOUR TEETH THE MORNING OF SURGERY WITH YOUR REGULAR TOOTHPASTE  DENTURES WILL BE REMOVED PRIOR TO SURGERY PLEASE DO NOT APPLY "Poly grip" OR ADHESIVES!!!   Do NOT smoke after Midnight   Take these medicines the morning of surgery with A SIP OF WATER: lorazepam,gabapentin,escitalopram,famotidine,pantoprazole.Tylenol,ultram as needed.  You may not have any metal on your body including hair pins, jewelry, and body piercing             Do not wear make-up, lotions, powders, perfumes/cologne, or deodorant  Do not wear nail polish including gel and S&S, artificial/acrylic nails, or any other type of covering on natural nails including finger and toenails. If you have artificial nails, gel coating, etc. that needs to be removed by a nail salon please have this removed prior to surgery or surgery may need to be canceled/ delayed if the surgeon/ anesthesia feels like they are unable to be safely monitored.   Do not shave  48 hours prior to surgery.    Do not bring valuables to the hospital. [place].   Contacts, glasses, or bridgework may not be worn into surgery.   Bring small overnight bag day of surgery.   DO NOT [place]. PHARMACY WILL DISPENSE MEDICATIONS LISTED ON YOUR MEDICATION LIST TO YOU DURING YOUR ADMISSION [place]!    Patients discharged on the day of surgery will not be allowed to drive home.  Someone NEEDS to stay with you for the first 24 hours after anesthesia.   Special Instructions: Bring a copy of your healthcare power of attorney and living will documents         the day of surgery if you haven't scanned them before.              Please read over the following fact sheets you were given: IF YOU HAVE QUESTIONS ABOUT YOUR PRE-OP INSTRUCTIONS PLEASE CALL [phone]    [facility] - Preparing for Surgery Before surgery, you can play  an important role.  Because skin is not sterile, your skin needs to be as free of germs as possible.  You can reduce the number of germs on your skin by washing with CHG (chlorahexidine gluconate) soap before surgery.  CHG is an antiseptic cleaner which kills germs and bonds with the skin to continue killing germs even after washing. Please DO NOT use if you have an allergy to CHG or antibacterial soaps.  If your skin becomes reddened/irritated stop using the CHG and inform your nurse when you arrive at Short Stay. Do not shave (including legs and underarms) for at least 48 hours prior to the first CHG shower.  You may shave your face/neck. Please follow these instructions carefully:  1.  Shower with CHG Soap the night before surgery and the  morning of Surgery.  2.  If you choose to wash your hair, wash your hair first as usual with your  normal  shampoo.  3.  After you shampoo, rinse your hair and body thoroughly to remove the  shampoo.                           4.  Use CHG as you would any other liquid soap.  You can apply chg directly  to the skin and wash                       Gently with a scrungie or clean washcloth.  5.  Apply the CHG Soap to your body ONLY FROM THE NECK DOWN.   Do not use on face/ open  Wound or open sores. Avoid contact with eyes, ears mouth and genitals (private parts).                       Wash face,  Genitals (private parts) with your normal soap.             6.  Wash thoroughly, paying special attention to the area where your surgery  will be performed.  7.  Thoroughly rinse your body with warm water from the neck down.  8.  DO NOT shower/wash with your normal soap after using and rinsing off  the CHG Soap.                9.  Pat yourself dry with a clean towel.            10.  Wear clean pajamas.            11.  Place clean sheets on your bed the night of your first shower and do not  sleep with pets. Day of Surgery : Do not apply any  lotions/deodorants the morning of surgery.  Please wear clean clothes to the hospital/surgery center.  FAILURE TO FOLLOW THESE INSTRUCTIONS MAY RESULT IN THE CANCELLATION OF YOUR SURGERY PATIENT SIGNATURE_________________________________  NURSE SIGNATURE__________________________________  ________________________________________________________________________  Tanya Duncan  An incentive spirometer is a tool that can help keep your lungs clear and active. This tool measures how well you are filling your lungs with each breath. Taking long deep breaths may help reverse or decrease the chance of developing breathing (pulmonary) problems (especially infection) following: A long period of time when you are unable to move or be active. BEFORE THE PROCEDURE  If the spirometer includes an indicator to show your best effort, your nurse or respiratory therapist will set it to a desired goal. If possible, sit up straight or lean slightly forward. Try not to slouch. Hold the incentive spirometer in an upright position. INSTRUCTIONS FOR USE  Sit on the edge of your bed if possible, or sit up as far as you can in bed or on a chair. Hold the incentive spirometer in an upright position. Breathe out normally. Place the mouthpiece in your mouth and seal your lips tightly around it. Breathe in slowly and as deeply as possible, raising the piston or the ball toward the top of the column. Hold your breath for 3-5 seconds or for as long as possible. Allow the piston or ball to fall to the bottom of the column. Remove the mouthpiece from your mouth and breathe out normally. Rest for a few seconds and repeat Steps 1 through 7 at least 10 times every 1-2 hours when you are awake. Take your time and take a few normal breaths between deep breaths. The spirometer may include an indicator to show your best effort. Use the indicator as a goal to work toward during each repetition. After each set of 10 deep  breaths, practice coughing to be sure your lungs are clear. If you have an incision (the cut made at the time of surgery), support your incision when coughing by placing a pillow or rolled up towels firmly against it. Once you are able to get out of bed, walk around indoors and cough well. You may stop using the incentive spirometer when instructed by your caregiver.  RISKS AND COMPLICATIONS Take your time so you do not get dizzy or light-headed. If you are in pain, you may need to take or ask  for pain medication before doing incentive spirometry. It is harder to take a deep breath if you are having pain. AFTER USE Rest and breathe slowly and easily. It can be helpful to keep track of a log of your progress. Your caregiver can provide you with a simple table to help with this. If you are using the spirometer at home, follow these instructions: [place] IF:  You are having difficultly using the spirometer. You have trouble using the spirometer as often as instructed. Your pain medication is not giving enough relief while using the spirometer. You develop fever of 100.5 F (38.1 C) or higher. SEEK IMMEDIATE MEDICAL CARE IF:  You cough up bloody sputum that had not been present before. You develop fever of 102 F (38.9 C) or greater. You develop worsening pain at or near the incision site. MAKE SURE YOU:  Understand these instructions. Will watch your condition. Will get help right away if you are not doing well or get worse. Document Released: 03/06/2007 Document Revised: 01/16/2012 Document Reviewed: 05/07/2007 [facility] Patient Information 2014 [place], [place].   ________________________________________________________________________

## 2023-05-12 ENCOUNTER — Other Ambulatory Visit: Payer: Self-pay

## 2023-05-12 ENCOUNTER — Encounter (HOSPITAL_COMMUNITY)
Admission: RE | Admit: 2023-05-12 | Discharge: 2023-05-12 | Disposition: A | Discharge status: Home / Self Care | Payer: Medicare Other | Source: Ambulatory Visit | Attending: Orthopedic Surgery | Admitting: Orthopedic Surgery

## 2023-05-12 ENCOUNTER — Encounter (HOSPITAL_COMMUNITY): Payer: Self-pay

## 2023-05-12 VITALS — BP 141/87 | HR 92 | Temp 98.4°F | Ht 62.5 in | Wt 138.0 lb

## 2023-05-12 DIAGNOSIS — Z01818 Encounter for other preprocedural examination: Secondary | ICD-10-CM | POA: Diagnosis not present

## 2023-05-12 DIAGNOSIS — R079 Chest pain, unspecified: Secondary | ICD-10-CM | POA: Insufficient documentation

## 2023-05-12 HISTORY — DX: Nausea with vomiting, unspecified: R11.2

## 2023-05-12 HISTORY — DX: Nausea with vomiting, unspecified: Z98.890

## 2023-05-12 HISTORY — DX: Other complications of anesthesia, initial encounter: T88.59XA

## 2023-05-12 HISTORY — DX: Palpitations: R00.2

## 2023-05-12 HISTORY — DX: Prediabetes: R73.03

## 2023-05-12 LAB — CBC
HCT: 35.5 % — ABNORMAL LOW (ref 36.0–46.0)
Hemoglobin: 11.1 g/dL — ABNORMAL LOW (ref 12.0–15.0)
MCH: 28.2 pg (ref 26.0–34.0)
MCHC: 31.3 g/dL (ref 30.0–36.0)
MCV: 90.3 fL (ref 80.0–100.0)
Platelets: 327 10*3/uL (ref 150–400)
RBC: 3.93 MIL/uL (ref 3.87–5.11)
RDW: 13.2 % (ref 11.5–15.5)
WBC: 7.1 10*3/uL (ref 4.0–10.5)
nRBC: 0 % (ref 0.0–0.2)

## 2023-05-12 LAB — BASIC METABOLIC PANEL
Anion gap: 8 (ref 5–15)
BUN: 10 mg/dL (ref 8–23)
CO2: 27 mmol/L (ref 22–32)
Calcium: 9.3 mg/dL (ref 8.9–10.3)
Chloride: 100 mmol/L (ref 98–111)
Creatinine, Ser: 0.69 mg/dL (ref 0.44–1.00)
GFR, Estimated: >60 mL/min (ref >60)
Glucose, Bld: 101 mg/dL — ABNORMAL HIGH (ref 70–99)
Potassium: 4.5 mmol/L (ref 3.5–5.1)
Sodium: 135 mmol/L (ref 135–145)

## 2023-05-12 NOTE — Progress Notes (Addendum)
For Short Stay: COVID SWAB appointment date:  Bowel Prep reminder:   For Anesthesia: PCP - Jordan Hawks, PA . LOV: 04/17/23 Cardiologist - N/A  Chest x-ray -  EKG - 05/12/23 Stress Test -  ECHO -  Cardiac Cath -  Pacemaker/ICD device last checked: Pacemaker orders received: Device Rep notified:  Spinal Cord Stimulator: N/A  Sleep Study - N/A CPAP -   Fasting Blood Sugar - N/A Checks Blood Sugar _____ times a day Date and result of last Hgb A1c-  Last dose of GLP1 agonist- N/A GLP1 instructions:   Last dose of SGLT-2 inhibitors- N/A SGLT-2 instructions:   Blood Thinner Instructions: N/A Aspirin Instructions: Last Dose:  Activity level: Can go up a flight of stairs and activities of daily living without stopping and without chest pain and/or shortness of breath   Able to exercise without chest pain and/or shortness of breath  Anesthesia review: Hx: Abnormal EKG,Palpitations.  Patient denies shortness of breath, fever, cough and chest pain at PAT appointment   Patient verbalized understanding of instructions that were given to them at the PAT appointment. Patient was also instructed that they will need to review over the PAT instructions again at home before surgery.

## 2023-05-12 NOTE — Progress Notes (Signed)
Several attempts to reach pharmacy tech were unsuccessful.

## 2023-05-15 ENCOUNTER — Other Ambulatory Visit: Payer: Self-pay

## 2023-05-15 ENCOUNTER — Observation Stay (HOSPITAL_COMMUNITY)
Admission: RE | Admit: 2023-05-15 | Discharge: 2023-05-16 | Disposition: A | Discharge status: Home / Self Care | Payer: Medicare Other | Attending: Orthopedic Surgery | Admitting: Orthopedic Surgery

## 2023-05-15 ENCOUNTER — Ambulatory Visit (HOSPITAL_COMMUNITY): Payer: Medicare Other

## 2023-05-15 ENCOUNTER — Encounter (HOSPITAL_COMMUNITY)
Admission: RE | Disposition: A | Discharge status: Home / Self Care | Payer: Self-pay | Source: Home / Self Care | Attending: Orthopedic Surgery

## 2023-05-15 ENCOUNTER — Ambulatory Visit (HOSPITAL_COMMUNITY): Payer: Medicare Other | Admitting: Anesthesiology

## 2023-05-15 ENCOUNTER — Ambulatory Visit (HOSPITAL_COMMUNITY): Payer: Medicare Other | Admitting: Physician Assistant

## 2023-05-15 ENCOUNTER — Encounter (HOSPITAL_COMMUNITY): Payer: Self-pay | Admitting: Orthopedic Surgery

## 2023-05-15 DIAGNOSIS — Y828 Other medical devices associated with adverse incidents: Secondary | ICD-10-CM | POA: Insufficient documentation

## 2023-05-15 DIAGNOSIS — Z96641 Presence of right artificial hip joint: Secondary | ICD-10-CM | POA: Insufficient documentation

## 2023-05-15 DIAGNOSIS — Z96612 Presence of left artificial shoulder joint: Secondary | ICD-10-CM | POA: Insufficient documentation

## 2023-05-15 DIAGNOSIS — M9732XA Periprosthetic fracture around internal prosthetic left shoulder joint, initial encounter: Secondary | ICD-10-CM | POA: Diagnosis not present

## 2023-05-15 DIAGNOSIS — Z9889 Other specified postprocedural states: Secondary | ICD-10-CM

## 2023-05-15 DIAGNOSIS — T8489XA Other specified complication of internal orthopedic prosthetic devices, implants and grafts, initial encounter: Secondary | ICD-10-CM | POA: Diagnosis present

## 2023-05-15 HISTORY — PX: ORIF HUMERUS FRACTURE: SHX2126

## 2023-05-15 SURGERY — OPEN REDUCTION INTERNAL FIXATION (ORIF) HUMERAL SHAFT FRACTURE
Anesthesia: Regional | Laterality: Left

## 2023-05-15 MED ORDER — BUPIVACAINE LIPOSOME 1.3 % IJ SUSP
INTRAMUSCULAR | Status: DC | PRN
  Administered 2023-05-15: 10 mL via PERINEURAL

## 2023-05-15 MED ORDER — METHOCARBAMOL 500 MG IVPB - SIMPLE MED: 500.0000 mg | Freq: Four times a day (QID) | INTRAVENOUS | Status: DC | PRN

## 2023-05-15 MED ORDER — FLEET ENEMA 7-19 GM/118ML RE ENEM: 1.0000 | ENEMA | Freq: Once | RECTAL | Status: DC | PRN

## 2023-05-15 MED ORDER — OXYCODONE HCL 5 MG PO TABS
10.0000 mg | ORAL_TABLET | ORAL | Status: DC | PRN
  Filled 2023-05-15: qty 2

## 2023-05-15 MED ORDER — PROPOFOL 10 MG/ML IV BOLUS
INTRAVENOUS | Status: AC
  Filled 2023-05-15: qty 20

## 2023-05-15 MED ORDER — LORAZEPAM 1 MG PO TABS
1.0000 mg | ORAL_TABLET | Freq: Two times a day (BID) | ORAL | Status: DC
  Administered 2023-05-15 – 2023-05-16 (×2): 1 mg via ORAL
  Filled 2023-05-15 (×2): qty 1

## 2023-05-15 MED ORDER — ESCITALOPRAM OXALATE 20 MG PO TABS
20.0000 mg | ORAL_TABLET | Freq: Every day | ORAL | Status: DC
  Administered 2023-05-16: 20 mg via ORAL
  Filled 2023-05-15: qty 1

## 2023-05-15 MED ORDER — ONDANSETRON HCL 4 MG/2ML IJ SOLN
INTRAMUSCULAR | Status: DC | PRN
  Administered 2023-05-15: 4 mg via INTRAVENOUS

## 2023-05-15 MED ORDER — ROCURONIUM BROMIDE 100 MG/10ML IV SOLN
INTRAVENOUS | Status: DC | PRN
  Administered 2023-05-15: 40 mg via INTRAVENOUS

## 2023-05-15 MED ORDER — METHOCARBAMOL 500 MG PO TABS
ORAL_TABLET | ORAL | Status: AC
  Filled 2023-05-15: qty 1

## 2023-05-15 MED ORDER — PHENYLEPHRINE HCL (PRESSORS) 10 MG/ML IV SOLN
INTRAVENOUS | Status: DC | PRN
  Administered 2023-05-15: 80 ug via INTRAVENOUS
  Administered 2023-05-15: 100 ug via INTRAVENOUS

## 2023-05-15 MED ORDER — CEFAZOLIN SODIUM-DEXTROSE 2-4 GM/100ML-% IV SOLN
2.0000 g | INTRAVENOUS | Status: AC
  Administered 2023-05-15: 2 g via INTRAVENOUS
  Filled 2023-05-15: qty 100

## 2023-05-15 MED ORDER — SODIUM CHLORIDE 0.9 % IV SOLN: INTRAVENOUS | Status: DC

## 2023-05-15 MED ORDER — ACETAMINOPHEN 500 MG PO TABS: 1000.0000 mg | ORAL_TABLET | Freq: Four times a day (QID) | ORAL | Status: DC

## 2023-05-15 MED ORDER — MELATONIN 5 MG PO TABS: 5.0000 mg | ORAL_TABLET | Freq: Every evening | ORAL | Status: DC | PRN

## 2023-05-15 MED ORDER — PHENYLEPHRINE HCL-NACL 20-0.9 MG/250ML-% IV SOLN
INTRAVENOUS | Status: DC | PRN
  Administered 2023-05-15: 40 ug/min via INTRAVENOUS

## 2023-05-15 MED ORDER — PHENYLEPHRINE HCL (PRESSORS) 10 MG/ML IV SOLN
INTRAVENOUS | Status: AC
  Filled 2023-05-15: qty 1

## 2023-05-15 MED ORDER — 0.9 % SODIUM CHLORIDE (POUR BTL) OPTIME
TOPICAL | Status: DC | PRN
  Administered 2023-05-15: 1000 mL

## 2023-05-15 MED ORDER — GABAPENTIN 300 MG PO CAPS
600.0000 mg | ORAL_CAPSULE | Freq: Two times a day (BID) | ORAL | Status: DC
  Administered 2023-05-15 – 2023-05-16 (×2): 600 mg via ORAL
  Filled 2023-05-15 (×2): qty 2

## 2023-05-15 MED ORDER — OXYCODONE HCL 5 MG PO TABS
5.0000 mg | ORAL_TABLET | ORAL | Status: DC | PRN
  Administered 2023-05-15 (×2): 5 mg via ORAL
  Administered 2023-05-16 (×3): 10 mg via ORAL
  Filled 2023-05-15 (×2): qty 2
  Filled 2023-05-15: qty 1

## 2023-05-15 MED ORDER — LIDOCAINE HCL (PF) 2 % IJ SOLN
INTRAMUSCULAR | Status: AC
  Filled 2023-05-15: qty 5

## 2023-05-15 MED ORDER — DIPHENHYDRAMINE HCL 12.5 MG/5ML PO ELIX: 12.5000 mg | ORAL_SOLUTION | ORAL | Status: DC | PRN

## 2023-05-15 MED ORDER — ACETAMINOPHEN 325 MG PO TABS: 325.0000 mg | ORAL_TABLET | Freq: Four times a day (QID) | ORAL | Status: DC | PRN

## 2023-05-15 MED ORDER — ONDANSETRON HCL 4 MG/2ML IJ SOLN: 4.0000 mg | Freq: Once | INTRAMUSCULAR | Status: DC | PRN

## 2023-05-15 MED ORDER — ASPIRIN 81 MG PO TBEC
81.0000 mg | DELAYED_RELEASE_TABLET | Freq: Every day | ORAL | Status: DC
  Administered 2023-05-16: 81 mg via ORAL
  Filled 2023-05-15: qty 1

## 2023-05-15 MED ORDER — SUGAMMADEX SODIUM 200 MG/2ML IV SOLN
INTRAVENOUS | Status: DC | PRN
  Administered 2023-05-15: 200 mg via INTRAVENOUS

## 2023-05-15 MED ORDER — PROPOFOL 10 MG/ML IV BOLUS
INTRAVENOUS | Status: DC | PRN
  Administered 2023-05-15: 100 mg via INTRAVENOUS

## 2023-05-15 MED ORDER — DOCUSATE SODIUM 100 MG PO CAPS
100.0000 mg | ORAL_CAPSULE | Freq: Two times a day (BID) | ORAL | Status: DC
  Administered 2023-05-15 – 2023-05-16 (×2): 100 mg via ORAL
  Filled 2023-05-15 (×2): qty 1

## 2023-05-15 MED ORDER — FENTANYL CITRATE PF 50 MCG/ML IJ SOSY
25.0000 ug | PREFILLED_SYRINGE | INTRAMUSCULAR | Status: DC | PRN
  Administered 2023-05-15: 25 ug via INTRAVENOUS

## 2023-05-15 MED ORDER — POLYETHYLENE GLYCOL 3350 17 G PO PACK: 17.0000 g | PACK | Freq: Every day | ORAL | Status: DC | PRN

## 2023-05-15 MED ORDER — FENTANYL CITRATE PF 50 MCG/ML IJ SOSY
50.0000 ug | PREFILLED_SYRINGE | INTRAMUSCULAR | Status: DC
  Administered 2023-05-15: 50 ug via INTRAVENOUS
  Filled 2023-05-15: qty 2

## 2023-05-15 MED ORDER — OXYCODONE HCL 5 MG PO TABS
ORAL_TABLET | ORAL | Status: AC
  Filled 2023-05-15: qty 1

## 2023-05-15 MED ORDER — BISACODYL 5 MG PO TBEC: 5.0000 mg | DELAYED_RELEASE_TABLET | Freq: Every day | ORAL | Status: DC | PRN

## 2023-05-15 MED ORDER — FENTANYL CITRATE (PF) 250 MCG/5ML IJ SOLN
INTRAMUSCULAR | Status: AC
  Filled 2023-05-15: qty 5

## 2023-05-15 MED ORDER — ONDANSETRON HCL 4 MG PO TABS: 4.0000 mg | ORAL_TABLET | Freq: Four times a day (QID) | ORAL | Status: DC | PRN

## 2023-05-15 MED ORDER — DEXAMETHASONE SODIUM PHOSPHATE 10 MG/ML IJ SOLN
INTRAMUSCULAR | Status: DC | PRN
  Administered 2023-05-15: 4 mg via INTRAVENOUS

## 2023-05-15 MED ORDER — AMISULPRIDE (ANTIEMETIC) 5 MG/2ML IV SOLN: 10.0000 mg | Freq: Once | INTRAVENOUS | Status: DC | PRN

## 2023-05-15 MED ORDER — NORTRIPTYLINE HCL 25 MG PO CAPS
100.0000 mg | ORAL_CAPSULE | Freq: Every day | ORAL | Status: DC
  Administered 2023-05-15: 100 mg via ORAL
  Filled 2023-05-15 (×2): qty 4

## 2023-05-15 MED ORDER — ACETAMINOPHEN 10 MG/ML IV SOLN: 1000.0000 mg | Freq: Once | INTRAVENOUS | Status: DC | PRN

## 2023-05-15 MED ORDER — LACTATED RINGERS IV SOLN: INTRAVENOUS | Status: DC

## 2023-05-15 MED ORDER — CHLORHEXIDINE GLUCONATE 0.12 % MT SOLN
15.0000 mL | Freq: Once | OROMUCOSAL | Status: AC
  Administered 2023-05-15: 15 mL via OROMUCOSAL

## 2023-05-15 MED ORDER — PHENOL 1.4 % MT LIQD: 1.0000 | OROMUCOSAL | Status: DC | PRN

## 2023-05-15 MED ORDER — MIDAZOLAM HCL 2 MG/2ML IJ SOLN: 1.0000 mg | INTRAMUSCULAR | Status: DC

## 2023-05-15 MED ORDER — ACETAMINOPHEN 500 MG PO TABS
1000.0000 mg | ORAL_TABLET | Freq: Once | ORAL | Status: DC
  Filled 2023-05-15: qty 2

## 2023-05-15 MED ORDER — ACETAMINOPHEN 500 MG PO TABS
1000.0000 mg | ORAL_TABLET | Freq: Four times a day (QID) | ORAL | Status: DC
  Administered 2023-05-15 – 2023-05-16 (×4): 1000 mg via ORAL
  Filled 2023-05-15 (×4): qty 2

## 2023-05-15 MED ORDER — MENTHOL 3 MG MT LOZG: 1.0000 | LOZENGE | OROMUCOSAL | Status: DC | PRN

## 2023-05-15 MED ORDER — ORAL CARE MOUTH RINSE: 15.0000 mL | Freq: Once | OROMUCOSAL | Status: AC

## 2023-05-15 MED ORDER — ALUM & MAG HYDROXIDE-SIMETH 200-200-20 MG/5ML PO SUSP: 30.0000 mL | ORAL | Status: DC | PRN

## 2023-05-15 MED ORDER — LIDOCAINE HCL (CARDIAC) PF 100 MG/5ML IV SOSY
PREFILLED_SYRINGE | INTRAVENOUS | Status: DC | PRN
  Administered 2023-05-15: 60 mg via INTRAVENOUS

## 2023-05-15 MED ORDER — ZOLPIDEM TARTRATE 5 MG PO TABS: 5.0000 mg | ORAL_TABLET | Freq: Every evening | ORAL | Status: DC | PRN

## 2023-05-15 MED ORDER — PRAVASTATIN SODIUM 20 MG PO TABS
80.0000 mg | ORAL_TABLET | Freq: Every day | ORAL | Status: DC
  Administered 2023-05-16: 80 mg via ORAL
  Filled 2023-05-15: qty 4

## 2023-05-15 MED ORDER — METHOCARBAMOL 500 MG PO TABS
500.0000 mg | ORAL_TABLET | Freq: Four times a day (QID) | ORAL | Status: DC | PRN
  Administered 2023-05-15 – 2023-05-16 (×2): 500 mg via ORAL
  Filled 2023-05-15: qty 1

## 2023-05-15 MED ORDER — ROCURONIUM BROMIDE 10 MG/ML (PF) SYRINGE
PREFILLED_SYRINGE | INTRAVENOUS | Status: AC
  Filled 2023-05-15: qty 10

## 2023-05-15 MED ORDER — FENTANYL CITRATE PF 50 MCG/ML IJ SOSY
PREFILLED_SYRINGE | INTRAMUSCULAR | Status: AC
  Administered 2023-05-15: 25 ug via INTRAVENOUS
  Filled 2023-05-15: qty 1

## 2023-05-15 MED ORDER — ONDANSETRON HCL 4 MG/2ML IJ SOLN
INTRAMUSCULAR | Status: AC
  Filled 2023-05-15: qty 2

## 2023-05-15 MED ORDER — HYDROMORPHONE HCL 1 MG/ML IJ SOLN: 0.5000 mg | INTRAMUSCULAR | Status: DC | PRN

## 2023-05-15 MED ORDER — FENTANYL CITRATE (PF) 100 MCG/2ML IJ SOLN
INTRAMUSCULAR | Status: DC | PRN
  Administered 2023-05-15: 50 ug via INTRAVENOUS

## 2023-05-15 MED ORDER — ONDANSETRON HCL 4 MG/2ML IJ SOLN: 4.0000 mg | Freq: Four times a day (QID) | INTRAMUSCULAR | Status: DC | PRN

## 2023-05-15 MED ORDER — BUPIVACAINE HCL (PF) 0.5 % IJ SOLN
INTRAMUSCULAR | Status: DC | PRN
  Administered 2023-05-15: 14 mL via PERINEURAL

## 2023-05-15 SURGICAL SUPPLY — 67 items
AID PSTN UNV HD RSTRNT DISP (MISCELLANEOUS) ×1
BAG COUNTER SPONGE SURGICOUNT (BAG) IMPLANT
BAG SPEC THK2 15X12 ZIP CLS (MISCELLANEOUS) ×1
BAG SPNG CNTER NS LX DISP (BAG)
BAG ZIPLOCK 12X15 (MISCELLANEOUS) ×1 IMPLANT
BIT DRILL CALIB QC 170X80 (BIT) IMPLANT
BIT DRILL QC SFS 2.5X170 (BIT) IMPLANT
COOLER ICEMAN CLASSIC (MISCELLANEOUS) ×1 IMPLANT
COVER SURGICAL LIGHT HANDLE (MISCELLANEOUS) ×1 IMPLANT
DRAPE C-ARM 42X120 X-RAY (DRAPES) ×1 IMPLANT
DRAPE INCISE IOBAN 66X45 STRL (DRAPES) ×1 IMPLANT
DRAPE ORTHO SPLIT 77X108 STRL (DRAPES) ×2
DRAPE POUCH INSTRU U-SHP 10X18 (DRAPES) ×1 IMPLANT
DRAPE SURG 17X11 SM STRL (DRAPES) ×1 IMPLANT
DRAPE SURG ORHT 6 SPLT 77X108 (DRAPES) ×2 IMPLANT
DRAPE U-SHAPE 47X51 STRL (DRAPES) ×1 IMPLANT
DRESSING MEPILEX FLEX 4X4 (GAUZE/BANDAGES/DRESSINGS) ×3 IMPLANT
DRSG AQUACEL AG ADV 3.5X14 (GAUZE/BANDAGES/DRESSINGS) IMPLANT
DRSG MEPILEX FLEX 4X4 (GAUZE/BANDAGES/DRESSINGS) ×3
DURAPREP 26ML APPLICATOR (WOUND CARE) ×1 IMPLANT
ELECT BLADE TIP CTD 4 INCH (ELECTRODE) ×1 IMPLANT
ELECT REM PT RETURN 15FT ADLT (MISCELLANEOUS) ×1 IMPLANT
FIBERTAPE CERCLAGE TLINK SUT (SUTURE) IMPLANT
GAUZE SPONGE 2X2 8PLY STRL LF (GAUZE/BANDAGES/DRESSINGS) IMPLANT
GLOVE BIO SURGEON STRL SZ7 (GLOVE) ×1 IMPLANT
GLOVE BIO SURGEON STRL SZ7.5 (GLOVE) ×1 IMPLANT
GLOVE BIOGEL PI IND STRL 6.5 (GLOVE) ×1 IMPLANT
GLOVE BIOGEL PI IND STRL 7.0 (GLOVE) ×1 IMPLANT
GLOVE BIOGEL PI IND STRL 8 (GLOVE) ×1 IMPLANT
GLOVE SURG POLYISO LF SZ6.5 (GLOVE) ×1 IMPLANT
GOWN STRL REUS W/ TWL XL LVL3 (GOWN DISPOSABLE) ×1 IMPLANT
GOWN STRL REUS W/TWL XL LVL3 (GOWN DISPOSABLE) ×1
KIT BASIN OR (CUSTOM PROCEDURE TRAY) ×1 IMPLANT
KIT TURNOVER KIT A (KITS) IMPLANT
MANIFOLD NEPTUNE II (INSTRUMENTS) ×1 IMPLANT
NDL MAYO CATGUT SZ4 TPR NDL (NEEDLE) ×1 IMPLANT
NEEDLE MAYO CATGUT SZ4 (NEEDLE) ×1 IMPLANT
NS IRRIG 1000ML POUR BTL (IV SOLUTION) ×1 IMPLANT
PACK SHOULDER (CUSTOM PROCEDURE TRAY) ×1 IMPLANT
PAD COLD SHLDR WRAP-ON (PAD) ×1 IMPLANT
PIN POSITIONING CERD STRL 3.5 (PIN) IMPLANT
PLATE HUMERUS PROX 3.5 6H LT (Plate) IMPLANT
PROTECTOR NERVE ULNAR (MISCELLANEOUS) ×1 IMPLANT
RESTRAINT HEAD UNIVERSAL NS (MISCELLANEOUS) ×1 IMPLANT
SCREW LOCK CORT ST 3.5X30 (Screw) IMPLANT
SCREW LOCK T15 FT 14X3.5X2.9X (Screw) IMPLANT
SCREW LOCK T15 FT 16X3.5X2.9X (Screw) IMPLANT
SCREW LOCK T15 FT 20X3.5XST (Screw) IMPLANT
SCREW LOCK T15 FT 22X3.5XST (Screw) IMPLANT
SCREW LOCK T15 FT 28X3.5X2.9X (Screw) IMPLANT
SCREW LOCK T15 FT 30X3.5X2.9X (Screw) IMPLANT
SCREW LOCKING 3.5X14 (Screw) ×1 IMPLANT
SCREW LOCKING 3.5X16 (Screw) ×3 IMPLANT
SCREW LOCKING 3.5X20 (Screw) ×1 IMPLANT
SCREW LOCKING 3.5X22 (Screw) ×1 IMPLANT
SCREW LOCKING 3.5X28 (Screw) ×1 IMPLANT
SCREW LOCKING 3.5X30 (Screw) ×2 IMPLANT
SLING ARM IMMOBILIZER LRG (SOFTGOODS) ×1 IMPLANT
SPONGE T-LAP 18X18 ~~LOC~~+RFID (SPONGE) IMPLANT
STRIP CLOSURE SKIN 1/2X4 (GAUZE/BANDAGES/DRESSINGS) ×1 IMPLANT
SUPPORT WRAP ARM LG (MISCELLANEOUS) ×1 IMPLANT
SUT MNCRL AB 4-0 PS2 18 (SUTURE) ×1 IMPLANT
SUT VIC AB 2-0 CT1 27 (SUTURE) ×4
SUT VIC AB 2-0 CT1 TAPERPNT 27 (SUTURE) ×1 IMPLANT
SUT VIC AB 4-0 PS2 27 (SUTURE) IMPLANT
TOWEL OR 17X26 10 PK STRL BLUE (TOWEL DISPOSABLE) ×1 IMPLANT
WATER STERILE IRR 1000ML POUR (IV SOLUTION) ×1 IMPLANT

## 2023-05-15 NOTE — Anesthesia Procedure Notes (Signed)
Anesthesia Regional Block: Interscalene brachial plexus block   Pre-Anesthetic Checklist: , timeout performed,  Correct Patient, Correct Site, Correct Laterality,  Correct Procedure, Correct Position, site marked,  Risks and benefits discussed,  Surgical consent,  Pre-op evaluation,  At surgeon's request and post-op pain management  Laterality: Left  Prep: chloraprep       Needles:  Injection technique: Single-shot  Needle Type: Echogenic Stimulator Needle     Needle Length: 9cm  Needle Gauge: 21     Additional Needles:   Procedures:,,,, ultrasound used (permanent image in chart),,    Narrative:  Start time: 05/15/2023 1:35 PM End time: 05/15/2023 1:45 PM Injection made incrementally with aspirations every 5 mL.  Performed by: Personally  Anesthesiologist: Leonides Grills, MD  Additional Notes: Functioning IV was confirmed and monitors were applied.  A timeout was performed. Sterile prep, hand hygiene and sterile gloves were used. A 90mm 21ga Arrow echogenic stimulator needle was used. Negative aspiration and negative test dose prior to incremental administration of local anesthetic. The patient tolerated the procedure well.  Ultrasound guidance: relevent anatomy identified, needle position confirmed, local anesthetic spread visualized around nerve(s), vascular puncture avoided.  Image printed for medical record.

## 2023-05-15 NOTE — Op Note (Signed)
Procedure(s): OPEN REDUCTION INTERNAL FIXATION (ORIF) PERIPROSTHETIC HUMERUS FRACTURE WITH PLATE REMOVAL Procedure Note  Tanya Duncan female 77 y.o. 05/15/2023  Preoperative diagnosis: Left shoulder periprosthetic proximal humerus fracture with retained plate  Operative diagnosis: Same  Procedure performed: #1 open reduction internal fixation left periprosthetic proximal humerus fracture #2 removal deep plate left humerus   Indications:  77 y.o. female status post reverse shoulder arthroplasty 2 years ago with retained humeral plate.  She fell about 2 weeks ago and sustained a proximal humerus periprosthetic fracture.  Indicated for surgical fixation to allow anatomic alignment and promote healing.  The previously placed plate will need to be removed.     Surgeon: Glennon Hamilton   Assistants: Fredia Sorrow PA-C [name] was present and scrubbed throughout the procedure and was essential in positioning, retraction, exposure, and closure)  Anesthesia: General endotracheal anesthesia with preoperative interscalene block given by the attending anesthesiologist    Procedure Detail  OPEN REDUCTION INTERNAL FIXATION (ORIF) PERIPROSTHETIC HUMERUS FRACTURE WITH PLATE REMOVAL   Estimated Blood Loss:  less than 100 mL         Drains: none  Blood Given: none          Specimens: none        Complications:  * No complications entered in OR log *         Disposition: PACU - hemodynamically stable.         Condition: stable      OPERATIVE FINDINGS:  The previously placed lateral humeral plate was removed along with all the screws that were through the plate.  1 screw which was outside of the plate was left in the bone.  The fracture was anatomically fixed with a Synthes locking proximal humerus plate with unicortical proximal locking screws and fiber tape cerclage fixation.  PROCEDURE: The patient was identified in the preoperative holding area  where I personally marked the  operative site after verifying site, side,  and procedure with the patient. An interscalene block given by  the attending anesthesiologist in the holding area and the patient was taken back to the operating room where all extremities were  carefully padded in position after general anesthesia was induced. She  was placed in a beach-chair position and the operative upper extremity was  prepped and draped in a standard sterile fashion.  Her previous incision was marked out and after the appropriate timeout procedure incised for the entire length of her prior incision.  This went from the anterior shoulder to just above the elbow.  Dissection was carried through subcutaneous tissues.  Tissue planes were difficult to ascertain as there was a significant amount of scar tissue.  The deltopectoral interval was grossly identified and the deltoid was taken laterally.  The underlying implant was visualized.  The plate was identified proximally and then traced distally taking care to try and stay lateral to the biceps. Once the entirety of the plate was exposed the screws were identified and sequentially removed.  The plate was then removed without significant difficulty.  The fracture was then anatomically reduced and the appropriate sized locking Synthes proximal humerus plate was placed laterally.  This was approximately the length of the previous plate which was removed.  1 distal nonlocking screw was placed to bring the plate down to the bone.  The plate did require a minimal amount of bending.  Proximally 5 or 6 unicortical locking screws were placed.  1 fiber tape cerclage was then placed just proximal to the  fracture around the metaphysis taking great care to stay directly on bone.  This was tightened and secured in place.  At this point 3 additional distal shaft locking screws were placed.  Final fluoroscopic imaging in AP and lateral planes demonstrated anatomic alignment of fracture with appropriate  positioning of the plate and screws.  This point copious irrigation was used and the wound was closed in layers with 2-0 Vicryl and staples for skin closure.  Sterile dressing was applied.  She was placed in a sling.  The patient was then allowed to awaken from anesthesia transferred to the stretcher and taken to the recovery room in stable condition.    POSTOPERATIVE PLAN: The patient will be kept in the hospital postoperatively overnight for pain control and observation.

## 2023-05-15 NOTE — Anesthesia Preprocedure Evaluation (Addendum)
Anesthesia Evaluation  Patient identified by MRN, date of birth, ID band Patient awake    Reviewed: Allergy & Precautions, NPO status , Patient's Chart, lab work & pertinent test results  Airway Mallampati: II  TM Distance: >3 FB Neck ROM: Full    Dental  (+) Poor Dentition   Pulmonary neg pulmonary ROS   Pulmonary exam normal        Cardiovascular negative cardio ROS Normal cardiovascular exam     Neuro/Psych  PSYCHIATRIC DISORDERS Anxiety Depression     Neuromuscular disease    GI/Hepatic Neg liver ROS, PUD,GERD  Medicated and Controlled,,  Endo/Other  negative endocrine ROS    Renal/GU negative Renal ROS     Musculoskeletal  (+) Arthritis ,  Fibromyalgia -  Abdominal   Peds  Hematology  (+) Blood dyscrasia, anemia   Anesthesia Other Findings LEFT SHOULDER PERIPROSTHETIC FRACTURE  Reproductive/Obstetrics                             Anesthesia Physical Anesthesia Plan  ASA: 2  Anesthesia Plan: General and Regional   Post-op Pain Management: Regional block*   Induction: Intravenous  PONV Risk Score and Plan: 3 and Ondansetron, Dexamethasone and Treatment may vary due to age or medical condition  Airway Management Planned: Oral ETT  Additional Equipment:   Intra-op Plan:   Post-operative Plan: Extubation in OR  Informed Consent: I have reviewed the patients History and Physical, chart, labs and discussed the procedure including the risks, benefits and alternatives for the proposed anesthesia with the patient or authorized representative who has indicated his/her understanding and acceptance.     Dental advisory given  Plan Discussed with: CRNA and Surgeon  Anesthesia Plan Comments:         Anesthesia Quick Evaluation

## 2023-05-15 NOTE — Anesthesia Procedure Notes (Signed)
Procedure Name: Intubation Date/Time: 05/15/2023 2:29 PM  Performed by: Johnette Abraham, CRNAPre-anesthesia Checklist: Patient identified, Emergency Drugs available, Suction available and Patient being monitored Patient Re-evaluated:Patient Re-evaluated prior to induction Oxygen Delivery Method: Circle System Utilized Preoxygenation: Pre-oxygenation with 100% oxygen Induction Type: IV induction Ventilation: Mask ventilation without difficulty Laryngoscope Size: Mac and 3 Grade View: Grade I Tube type: Oral Tube size: 7.0 mm Number of attempts: 1 Airway Equipment and Method: Stylet and Oral airway Placement Confirmation: ETT inserted through vocal cords under direct vision, positive ETCO2 and breath sounds checked- equal and bilateral Secured at: 21 cm Tube secured with: Tape Dental Injury: Teeth and Oropharynx as per pre-operative assessment

## 2023-05-15 NOTE — Plan of Care (Signed)
  Problem: Activity: Goal: Risk for activity intolerance will decrease Outcome: Progressing   Problem: Nutrition: Goal: Adequate nutrition will be maintained Outcome: Progressing   Problem: Safety: Goal: Ability to remain free from injury will improve Outcome: Progressing   Problem: Pain Managment: Goal: General experience of comfort will improve Outcome: Progressing   

## 2023-05-15 NOTE — Discharge Instructions (Signed)
Discharge Instructions after Open Shoulder Repair  A sling has been provided for you. Remain in your sling at all times. This includes sleeping in your sling.  Use ice on the shoulder intermittently over the first 48 hours after surgery.  Pain medicine has been prescribed for you.  Use your medicine liberally over the first 48 hours, and then you can begin to taper your use. You may take Extra Strength Tylenol or Tylenol only in place of the pain pills. DO NOT take ANY nonsteroidal anti-inflammatory pain medications: Advil, Motrin, Ibuprofen, Aleve, Naproxen or Naprosyn.  Keep dressing on until follow up appt. It is waterproof No driving Take one aspirin, a day for 2 weeks after surgery, unless you have an aspirin sensitivity/ allergy or asthma.   Please call [phone] during normal business hours or [phone] after hours for any problems. Including the following:  - excessive redness of the incisions - drainage for more than 4 days - fever of more than 101.5 F  *Please note that pain medications will not be refilled after hours or on weekends.

## 2023-05-15 NOTE — Transfer of Care (Signed)
Immediate Anesthesia Transfer of Care Note  Patient: Timera Hoblit  Procedure(s) Performed: OPEN REDUCTION INTERNAL FIXATION (ORIF) PERIPROSTHETIC HUMERUS FRACTURE WITH PLATE REMOVAL (Left)  Patient Location: PACU  Anesthesia Type:GA combined with regional for post-op pain  Level of Consciousness: awake, alert , oriented, and patient cooperative  Airway & Oxygen Therapy: Patient Spontanous Breathing and Patient connected to face mask oxygen  Post-op Assessment: Report given to RN and Post -op Vital signs reviewed and stable  Post vital signs: Reviewed and stable  Last Vitals:  Vitals Value Taken Time  BP 159/99 05/15/23 1624  Temp    Pulse    Resp 19 05/15/23 1627  SpO2    Vitals shown include unvalidated device data.  Last Pain:  Vitals:   05/15/23 1415  TempSrc:   PainSc: 0-No pain         Complications: No notable events documented.

## 2023-05-15 NOTE — H&P (Signed)
Tanya Duncan is an 77 y.o. female.   Chief Complaint: L shoulder injury   HPI: s/p fall with L periprosthetic proximal humerus fracture. Indicated for surgery to promote anatomic healing.  Past Medical History:  Diagnosis Date   Anxiety    Arthritis    Blood transfusion without reported diagnosis 05/2019   with hip surgery   Complication of anesthesia    Fibromyalgia    Fractured pelvis (HCC) 09/15/2020   right   GERD (gastroesophageal reflux disease)    GI bleed 7253,6644   x2   Hyperlipidemia    Palpitations    Pneumonia 2005   PONV (postoperative nausea and vomiting)    Pre-diabetes    Stomach ulcer 1990    Past Surgical History:  Procedure Laterality Date   ABDOMINAL HYSTERECTOMY     FRACTURE SURGERY Right 1998   ankle   JOINT REPLACEMENT     LUMBAR FUSION  2015   OPEN REDUCTION INTERNAL FIXATION (ORIF) PROXIMAL PHALANX Right 09/21/2020   Procedure: OPEN TREATMENT OF RIGHT LONG FINGER PROXIMAL PHALANX FRACTURE;  Surgeon: Mack Hook, MD;  Location: [facility];  Service: Orthopedics;  Laterality: Right;  LENGTH OF SURGERY: 75 MIN   ORIF HUMERUS FRACTURE Left    ORIF TIBIA FRACTURE Right 1998   with bone graft   PYLOROPLASTY     REVERSE SHOULDER ARTHROPLASTY Left 02/04/2021   Procedure: REVERSE SHOULDER ARTHROPLASTY WITH HARDWARE REMOVAL;  Surgeon: Jones Broom, MD;  Location: [facility] ORS;  Service: Orthopedics;  Laterality: Left;   TOTAL HIP ARTHROPLASTY  2011   TOTAL HIP ARTHROPLASTY Right 05/14/2019   Procedure: Right Anterior Hip Arthroplasty;  Surgeon: Marcene Corning, MD;  Location: [facility] OR[facility];  Service: Orthopedics;  Laterality: Right;   TOTAL SHOULDER REPLACEMENT  2014    Family History  Problem Relation Age of Onset   Colon cancer Father 30   Stomach cancer Paternal Uncle    Esophageal cancer Neg Hx    Rectal cancer Neg Hx    Social History:  reports that she has never smoked. She has never used smokeless tobacco. She reports current alcohol use. She reports  that she does not use drugs.  Allergies:  Allergies  Allergen Reactions   Atorvastatin Diarrhea   Ibuprofen Other (See Comments)    Has had GI bleeds x2 in past   Sage [Salvia Officinalis] Other (See Comments)    Heart races, flu-like symptoms    Sulfa Antibiotics Nausea And Vomiting   Avelox [Moxifloxacin Hcl In Nacl] Palpitations   Indocin [Indomethacin] Other (See Comments)    Dizziness    No medications prior to admission.    No results found for this or any previous visit (from the past 48 hour(s)). No results found.  Review of Systems  All other systems reviewed and are negative.   There were no vitals taken for this visit. Physical Exam HENT:     Head: Atraumatic.  Eyes:     Extraocular Movements: Extraocular movements intact.  Cardiovascular:     Pulses: Normal pulses.  Pulmonary:     Effort: Pulmonary effort is normal.  Musculoskeletal:     Comments: L shoulder swelling, TTP and pain with any movement. NVID  Neurological:     Mental Status: She is alert.      Assessment/Plan s/p fall with L periprosthetic proximal humerus fracture. Indicated for surgery to promote anatomic healing. Plan ORIF L periprosthetic proximal humerus fracture Risks / benefits of surgery discussed Consent on chart  NPO for OR Preop  antibiotics   Glennon Hamilton, MD 05/15/2023, 8:14 AM

## 2023-05-16 ENCOUNTER — Encounter (HOSPITAL_COMMUNITY): Payer: Self-pay | Admitting: Orthopedic Surgery

## 2023-05-16 DIAGNOSIS — T8489XA Other specified complication of internal orthopedic prosthetic devices, implants and grafts, initial encounter: Secondary | ICD-10-CM | POA: Diagnosis not present

## 2023-05-16 MED ORDER — ASPIRIN 81 MG PO TBEC: 81.0000 mg | DELAYED_RELEASE_TABLET | Freq: Every day | ORAL | 12 refills | Status: DC

## 2023-05-16 MED ORDER — OXYCODONE HCL 5 MG PO TABS: 5.0000 mg | ORAL_TABLET | ORAL | 0 refills | Status: DC | PRN

## 2023-05-16 MED ORDER — TIZANIDINE HCL 2 MG PO TABS: 2.0000 mg | ORAL_TABLET | Freq: Four times a day (QID) | ORAL | 0 refills | Status: DC | PRN

## 2023-05-16 NOTE — Anesthesia Postprocedure Evaluation (Signed)
Anesthesia Post Note  Patient: Tanya Duncan  Procedure(s) Performed: OPEN REDUCTION INTERNAL FIXATION (ORIF) PERIPROSTHETIC HUMERUS FRACTURE WITH PLATE REMOVAL (Left)     Patient location during evaluation: PACU Anesthesia Type: Regional and General Level of consciousness: awake Pain management: pain level controlled Vital Signs Assessment: post-procedure vital signs reviewed and stable Respiratory status: spontaneous breathing, nonlabored ventilation and respiratory function stable Cardiovascular status: blood pressure returned to baseline and stable Postop Assessment: no apparent nausea or vomiting Anesthetic complications: no   No notable events documented.  Last Vitals:  Vitals:   05/16/23 0156 05/16/23 0525  BP: 102/69 130/85  Pulse: 87 90  Resp: 17 17  Temp: 36.4 C 36.6 C  SpO2: 93% 94%    Last Pain:  Vitals:   05/16/23 0538  TempSrc:   PainSc: 0-No pain   Pain Goal:                   Catheryn Bacon Noeh Sparacino

## 2023-05-16 NOTE — Progress Notes (Signed)
[facility] RN called short stay , Pt unable to find ring.  Contacted short stay RN and NT who state ring in make-up bag from pre-op visit.  Communicated to [employer] and Pt, and they found Pt's ring in makeup bag.

## 2023-05-16 NOTE — Progress Notes (Signed)
   05/16/23 1227  TOC Brief Assessment  Insurance and Status Reviewed  Patient has primary care physician Yes  Home environment has been reviewed Patient resides alone  Prior level of function: Independent at baseline  Prior/Current Home Services No current home services  Social Determinants of Health Reivew SDOH reviewed no interventions necessary  Readmission risk has been reviewed Yes  Transition of care needs no transition of care needs at this time

## 2023-05-16 NOTE — Care Management Obs Status (Signed)
MEDICARE OBSERVATION STATUS NOTIFICATION   Patient Details  Name: Tanya Duncan MRN: [id number] Date of Birth: 02-May-1946   Medicare Observation Status Notification Given:  Yes    Ewing Schlein, LCSW 05/16/2023, 12:10 PM

## 2023-05-16 NOTE — Progress Notes (Signed)
PATIENT ID: Tanya Duncan  MRN: [id number]  DOB/AGE:  15-Jun-1946 / 77 y.o.  1 Day Post-Op Procedure(s) (LRB): OPEN REDUCTION INTERNAL FIXATION (ORIF) PERIPROSTHETIC HUMERUS FRACTURE WITH PLATE REMOVAL (Left)  Subjective: Patient reports that she has mild pain in the left arm. Reports that she is getting some feeling back in the arm and hand. No issues overnight.   Objective: Vital signs in last 24 hours: Temp:  [97.6 F (36.4 C)-98.7 F (37.1 C)] 97.9 F (36.6 C) (07/09 0525) Pulse Rate:  [87-117] 90 (07/09 0525) Resp:  [8-25] 17 (07/09 0525) BP: (102-174)/(69-112) 130/85 (07/09 0525) SpO2:  [92 %-98 %] 94 % (07/09 0525) Weight:  [62.6 kg] 62.6 kg (07/08 1249)  Intake/Output from previous day: 07/08 0701 - 07/09 0700 In: 2240 [P.O.:240; I.V.:1900; IV Piggyback:100] Out: 750 [Urine:650; Blood:100] Intake/Output this shift: Total I/O In: 240 [P.O.:240] Out: -    Physical Exam: Neurologically intact Sensation intact distally Intact pulses distally Able to move fingers in LUE but not wrist Incision: dressing C/D/I No cellulitis present  Assessment/Plan: 1 Day Post-Op Procedure(s) (LRB): OPEN REDUCTION INTERNAL FIXATION (ORIF) PERIPROSTHETIC HUMERUS FRACTURE WITH PLATE REMOVAL (Left)   Advance diet Non Weight Bearing (NWB) LUE VTE prophylaxis:  aspirin 81mg  daily Plan for DC home today. Discharge instructions discussed. Follow up in office in 2 weeks.    Santanna Olenik L. [place], PA-C 05/16/2023, 8:57 AM

## 2023-05-16 NOTE — Progress Notes (Signed)
Discharge package printed and instructions reviewed with  patient. Patient verbalizes understanding.

## 2023-05-16 NOTE — Plan of Care (Signed)
  Problem: Activity: Goal: Risk for activity intolerance will decrease 05/16/2023 0748 by Penelope Galas, RN Outcome: Progressing 05/15/2023 1816 by Penelope Galas, RN Outcome: Progressing   Problem: Pain Managment: Goal: General experience of comfort will improve 05/16/2023 0748 by Penelope Galas, RN Outcome: Progressing 05/15/2023 1816 by Penelope Galas, RN Outcome: Progressing   Problem: Safety: Goal: Ability to remain free from injury will improve 05/16/2023 0748 by Penelope Galas, RN Outcome: Progressing 05/15/2023 1816 by Penelope Galas, RN Outcome: Progressing

## 2023-05-18 NOTE — Discharge Summary (Signed)
Patient ID: Tanya Duncan MRN: [id number] DOB/AGE: [birth year] 75 y.o.  Admit date: 05/15/2023 Discharge date: 05/16/2023  Admission Diagnoses:  Principal Problem:   S/P ORIF (open reduction internal fixation) fracture   Discharge Diagnoses:  Same  Past Medical History:  Diagnosis Date   Anxiety    Arthritis    Blood transfusion without reported diagnosis 05/2019   with hip surgery   Complication of anesthesia    Fibromyalgia    Fractured pelvis (HCC) 09/15/2020   right   GERD (gastroesophageal reflux disease)    GI bleed 4098,1191   x2   Hyperlipidemia    Palpitations    Pneumonia 2005   PONV (postoperative nausea and vomiting)    Pre-diabetes    Stomach ulcer 1990    Surgeries: Procedure(s): OPEN REDUCTION INTERNAL FIXATION (ORIF) PERIPROSTHETIC HUMERUS FRACTURE WITH PLATE REMOVAL on 05/15/2023   Consultants:   Discharged Condition: Improved  Hospital Course: Tanya Duncan is an 77 y.o. female who was admitted 05/15/2023 for operative treatment ofS/P ORIF (open reduction internal fixation) fracture. Patient has severe unremitting pain that affects sleep, daily activities, and work/hobbies. After pre-op clearance the patient was taken to the operating room on 05/15/2023 and underwent  Procedure(s): OPEN REDUCTION INTERNAL FIXATION (ORIF) PERIPROSTHETIC HUMERUS FRACTURE WITH PLATE REMOVAL.    Patient was given perioperative antibiotics:  Anti-infectives (From admission, onward)    Start     Dose/Rate Route Frequency Ordered Stop   05/16/23 0600  ceFAZolin (ANCEF) IVPB 2g/100 mL premix        2 g 200 mL/hr over 30 Minutes Intravenous On call to O.R. 05/15/23 1237 05/15/23 1502        Patient was given sequential compression devices, early ambulation, and chemoprophylaxis to prevent DVT.  Patient benefited maximally from hospital stay and there were no complications.    Recent vital signs: No data found.   Recent laboratory studies: No results for input(s): "WBC",  "HGB", "HCT", "PLT", "NA", "K", "CL", "CO2", "BUN", "CREATININE", "GLUCOSE", "INR", "CALCIUM" in the last 72 hours.  Invalid input(s): "PT", "2"   Discharge Medications:   Allergies as of 05/16/2023       Reactions   Atorvastatin Diarrhea   Ibuprofen Other (See Comments)   Has had GI bleeds x2 in past   Sage [salvia Officinalis] Palpitations, Other (See Comments)   Heart races, flu-like symptoms    Sulfa Antibiotics Nausea And Vomiting   Avelox [moxifloxacin Hcl In Nacl] Palpitations   Indocin [indomethacin] Other (See Comments)   Dizziness        Medication List     STOP taking these medications    oxyCODONE-acetaminophen 5-325 MG tablet Commonly known as: PERCOCET/ROXICET   oxyCODONE-acetaminophen 7.5-325 MG tablet Commonly known as: PERCOCET   traMADol 50 MG tablet Commonly known as: ULTRAM       TAKE these medications    acetaminophen 500 MG tablet Commonly known as: TYLENOL Take 500-1,000 mg by mouth See admin instructions. Take 500 mg by mouth in the morning and an additional 500-1,000 mg three times a day as needed for pain   aspirin EC 81 MG tablet Take 1 tablet (81 mg total) by mouth daily. Swallow whole.   b complex vitamins tablet Take 1 tablet by mouth daily.   bismuth subsalicylate 262 MG/15ML suspension Commonly known as: PEPTO BISMOL Take 30 mLs by mouth every 6 (six) hours as needed for diarrhea or loose stools (or upset stomach).   calcium carbonate 750 MG chewable tablet Commonly known as: Scientist, research (medical)  EX Chew 1-3 tablets by mouth as needed for heartburn.   diclofenac Sodium 1 % Gel Commonly known as: VOLTAREN Apply 2 g topically 4 (four) times daily as needed (for back pain).   ergocalciferol 1.25 MG (50000 UT) capsule Commonly known as: VITAMIN D2 Take 50,000 Units by mouth every Monday.   escitalopram 20 MG tablet Commonly known as: LEXAPRO Take 20 mg by mouth daily.   famotidine 40 MG tablet Commonly known as: PEPCID TAKE 1  TABLET(40 MG) BY MOUTH TWICE DAILY What changed: See the new instructions.   gabapentin 300 MG capsule Commonly known as: NEURONTIN Take 600 mg by mouth 2 (two) times daily.   lidocaine 5 % Commonly known as: LIDODERM Place 1 patch onto the skin daily as needed (for pain).   LORazepam 1 MG tablet Commonly known as: ATIVAN Take 1 mg by mouth 3 (three) times daily as needed for anxiety.   multivitamin with minerals Tabs tablet Take 1 tablet by mouth daily.   nortriptyline 25 MG capsule Commonly known as: PAMELOR Take 50 mg by mouth at bedtime.   ondansetron 4 MG tablet Commonly known as: ZOFRAN Take 4 mg by mouth as needed for nausea or vomiting.   oxyCODONE 5 MG immediate release tablet Commonly known as: Oxy IR/ROXICODONE Take 1-2 tablets (5-10 mg total) by mouth every 4 (four) hours as needed for moderate pain (pain score 4-6). What changed:  how much to take when to take this reasons to take this   pantoprazole 40 MG tablet Commonly known as: PROTONIX TAKE 1 TABLET(40 MG) BY MOUTH TWICE DAILY What changed:  how much to take how to take this when to take this additional instructions   pravastatin 80 MG tablet Commonly known as: PRAVACHOL Take 80 mg by mouth daily.   tiZANidine 4 MG tablet Commonly known as: Zanaflex Take 1 tablet (4 mg total) by mouth every 8 (eight) hours as needed for muscle spasms. What changed: how much to take   vitamin C 1000 MG tablet Take 1,000-2,000 mg by mouth daily.   zolpidem 10 MG tablet Commonly known as: AMBIEN Take 10 mg by mouth at bedtime as needed for sleep (**MUST BE "TORRENT" BRAND**).       ASK your doctor about these medications    CALCIUM PO Take 500 mg by mouth See admin instructions. Chew 500 mg (1 gummie) by mouth once day   ferrous sulfate 325 (65 FE) MG tablet Take 325 mg by mouth daily as needed (for low iron- take with food).   nystatin cream Commonly known as: MYCOSTATIN Apply 1 Application  topically 2 (two) times daily as needed for dry skin.        Diagnostic Studies: DG Humerus Left  Result Date: 05/15/2023 CLINICAL DATA:  Surgery EXAM: LEFT HUMERUS - 2+ VIEW COMPARISON:  04/27/2023 FINDINGS: Five low resolution intraoperative spot views of the left shoulder. Total fluoroscopy time was 11 seconds, fluoroscopic dose of 0.89 mGy. Left shoulder replacement with additional plate and fixating screws for previously noted proximal humerus periprosthetic fracture IMPRESSION: Intraoperative fluoroscopic assistance provided during left shoulder surgery. Electronically Signed   By: Jasmine Pang M.D.   On: 05/15/2023 19:48   DG C-Arm 1-60 Min-No Report  Result Date: 05/15/2023 Fluoroscopy was utilized by the requesting physician.  No radiographic interpretation.   DG C-Arm 1-60 Min-No Report  Result Date: 05/15/2023 Fluoroscopy was utilized by the requesting physician.  No radiographic interpretation.   CT Shoulder Left Wo Contrast  Result  Date: 04/27/2023 CLINICAL DATA:  Left shoulder pain after a fall. EXAM: CT OF THE UPPER LEFT EXTREMITY WITHOUT CONTRAST TECHNIQUE: Multidetector CT imaging of the upper left extremity was performed according to the standard protocol. RADIATION DOSE REDUCTION: This exam was performed according to the departmental dose-optimization program which includes automated exposure control, adjustment of the mA and/or kV according to patient size and/or use of iterative reconstruction technique. COMPARISON:  Left shoulder and humerus x-rays from same day. CT left shoulder dated September 21, 2021. FINDINGS: Bones/Joint/Cartilage Prior reverse total shoulder arthroplasty and lateral plate and screw fixation of an old periprosthetic fracture. New acute periprosthetic fracture of the proximal humeral diaphysis with apex anterior angulation and displacement of the proximal fixation plate, best appreciated on the sagittal reformats (series 6, images 110-112). No dislocation.  Mild degenerative changes of the acromioclavicular joint. No joint effusion. Ligaments Ligaments are suboptimally evaluated by CT. Muscles and Tendons Unchanged subscapularis muscle atrophy. Soft tissue No fluid collection or hematoma. No soft tissue mass. The visualized left lung is clear. IMPRESSION: 1. New acute angulated periprosthetic fracture of the proximal humeral diaphysis. Electronically Signed   By: Obie Dredge M.D.   On: 04/27/2023 19:05   DG Humerus Left  Result Date: 04/27/2023 CLINICAL DATA:  Left arm pain after fall. EXAM: LEFT HUMERUS - 2+ VIEW COMPARISON:  September 21, 2021. FINDINGS: Status post left total shoulder arthroplasty and surgical internal fixation of old proximal left humeral fracture. There may be slightly increased angulation of the proximal left humerus which may represent old healed fracture, but acute fracture cannot be excluded. IMPRESSION: Status post left total shoulder arthroplasty and surgical internal fixation of old proximal left humeral fracture. Slightly increased angulation of proximal left humeral shaft is noted which may represent sequela of old fracture, but acute fracture cannot be excluded. CT scan may be performed for further evaluation. Electronically Signed   By: Lupita Raider M.D.   On: 04/27/2023 15:39   DG Shoulder Left  Result Date: 04/27/2023 CLINICAL DATA:  Left shoulder pain after fall. EXAM: LEFT SHOULDER - 2+ VIEW COMPARISON:  September 21, 2021. FINDINGS: Status post left shoulder arthroplasty as well as surgical internal fixation of proximal left humeral fracture. No acute fracture or dislocation is noted. Visualized ribs are unremarkable. IMPRESSION: Postsurgical changes as described above.  No acute abnormality seen. Electronically Signed   By: Lupita Raider M.D.   On: 04/27/2023 15:33    Disposition: Discharge disposition: 01-Home or Self Care          Follow-up Information     Porterfield, Kyran Connaughton, PA-C. Schedule an  appointment as soon as possible for a visit on 05/29/2023.   Specialty: Orthopedic Surgery Contact information: [address] [place] [address] [phone]                  Signed: Joice Lofts L. Porterfield, PA-C 05/18/2023, 10:41 AM

## 2023-06-24 ENCOUNTER — Encounter (HOSPITAL_COMMUNITY): Payer: Self-pay | Admitting: Emergency Medicine

## 2023-06-24 ENCOUNTER — Other Ambulatory Visit: Payer: Self-pay

## 2023-06-24 ENCOUNTER — Emergency Department (HOSPITAL_COMMUNITY): Payer: Medicare Other

## 2023-06-24 ENCOUNTER — Emergency Department (HOSPITAL_COMMUNITY): Admission: EM | Admit: 2023-06-24 | Payer: Medicare Other | Source: Home / Self Care

## 2023-06-24 DIAGNOSIS — W25XXXA Contact with sharp glass, initial encounter: Secondary | ICD-10-CM | POA: Diagnosis not present

## 2023-06-24 DIAGNOSIS — Z23 Encounter for immunization: Secondary | ICD-10-CM | POA: Insufficient documentation

## 2023-06-24 DIAGNOSIS — Z7982 Long term (current) use of aspirin: Secondary | ICD-10-CM | POA: Insufficient documentation

## 2023-06-24 DIAGNOSIS — S61511A Laceration without foreign body of right wrist, initial encounter: Secondary | ICD-10-CM | POA: Diagnosis not present

## 2023-06-24 DIAGNOSIS — S6991XA Unspecified injury of right wrist, hand and finger(s), initial encounter: Secondary | ICD-10-CM | POA: Diagnosis present

## 2023-06-24 MED ORDER — MORPHINE SULFATE (PF) 4 MG/ML IV SOLN
4.0000 mg | Freq: Once | INTRAVENOUS | Status: AC
  Administered 2023-06-24: 4 mg via INTRAMUSCULAR
  Filled 2023-06-24: qty 1

## 2023-06-24 MED ORDER — OXYCODONE-ACETAMINOPHEN 5-325 MG PO TABS
1.0000 | ORAL_TABLET | Freq: Once | ORAL | Status: AC
  Administered 2023-06-24: 1 via ORAL
  Filled 2023-06-24: qty 1

## 2023-06-24 MED ORDER — LIDOCAINE HCL (PF) 1 % IJ SOLN
10.0000 mL | Freq: Once | INTRAMUSCULAR | Status: AC
  Administered 2023-06-24: 10 mL
  Filled 2023-06-24: qty 30

## 2023-06-24 NOTE — ED Provider Notes (Signed)
[place] EMERGENCY DEPARTMENT AT [facility] Provider Note   CSN: [id number] Arrival date & time: 06/24/23  2049     History {Add pertinent medical, surgical, social history, OB history to HPI:1} Chief Complaint  Patient presents with  . Laceration    Tanya Duncan is a 77 y.o. female presents to the ED by EMS for evaluation of a laceration to her right wrist.  She states that she was attempting to kill a fly on the window when she pushed to smash the fly, her hand went through the window breaking the glass.  She does not take any blood thinners.  Bleeding controlled prior to arrival with a bandage in place.  Patient does not recall when her last tetanus shot was.       Home Medications Prior to Admission medications   Medication Sig Start Date End Date Taking? Authorizing Provider  acetaminophen (TYLENOL) 500 MG tablet Take 500-1,000 mg by mouth See admin instructions. Take 500 mg by mouth in the morning and an additional 500-1,000 mg three times a day as needed for pain    [provider]  Ascorbic Acid (VITAMIN C) 1000 MG tablet Take 1,000-2,000 mg by mouth daily.    [provider]  aspirin EC 81 MG tablet Take 1 tablet (81 mg total) by mouth daily. Swallow whole. 05/16/23   Porterfield, Amber, PA-C  b complex vitamins tablet Take 1 tablet by mouth daily.    [provider]  bismuth subsalicylate (PEPTO BISMOL) 262 MG/15ML suspension Take 30 mLs by mouth every 6 (six) hours as needed for diarrhea or loose stools (or upset stomach).    [provider]  calcium carbonate (TUMS EX) 750 MG chewable tablet Chew 1-3 tablets by mouth as needed for heartburn.    [provider]  CALCIUM PO Take 500 mg by mouth See admin instructions. Chew 500 mg (1 gummie) by mouth once day    [provider]  diclofenac Sodium (VOLTAREN) 1 % GEL Apply 2 g topically 4 (four) times daily as needed (for back pain). 03/07/19   [provider]  ergocalciferol (VITAMIN D2) 1.25 MG (50000 UT) capsule Take 50,000 Units by mouth every Monday.  08/26/20   [provider]  escitalopram (LEXAPRO) 20 MG tablet Take 20 mg by mouth daily.    [provider]  famotidine (PEPCID) 40 MG tablet TAKE 1 TABLET(40 MG) BY MOUTH TWICE DAILY Patient taking differently: Take 40 mg by mouth 2 (two) times daily. 01/09/23   Meryl Dare, MD  ferrous sulfate 325 (65 FE) MG tablet Take 325 mg by mouth daily as needed (for low iron- take with food).    [provider]  gabapentin (NEURONTIN) 300 MG capsule Take 600 mg by mouth 2 (two) times daily.    [provider]  lidocaine (LIDODERM) 5 % Place 1 patch onto the skin daily as needed (for pain).    [provider]  LORazepam (ATIVAN) 1 MG tablet Take 1 mg by mouth 3 (three) times daily as needed for anxiety.    [provider]  Multiple Vitamin (MULTIVITAMIN WITH MINERALS) TABS tablet Take 1 tablet by mouth daily.    [provider]  nortriptyline (PAMELOR) 25 MG capsule Take 50 mg by mouth at bedtime.    [provider]  nystatin cream (MYCOSTATIN) Apply 1 Application topically 2 (two) times daily as needed for dry skin.    [provider]  ondansetron [facility]) 4  MG tablet Take 4 mg by mouth as needed for nausea or vomiting. 12/01/21   [provider]  oxyCODONE (OXY IR/ROXICODONE) 5 MG immediate release tablet Take 1-2 tablets (5-10 mg total) by mouth every 4 (four) hours as needed for moderate pain (pain score 4-6). 05/16/23   Porterfield, Amber, PA-C  pantoprazole (PROTONIX) 40 MG tablet TAKE 1 TABLET(40 MG) BY MOUTH TWICE DAILY Patient taking differently: Take 80 mg by mouth daily before breakfast. 09/28/22   Meryl Dare, MD  pravastatin (PRAVACHOL) 80 MG tablet Take 80 mg by mouth daily.    [provider]  tiZANidine (ZANAFLEX) 4 MG tablet Take 1 tablet (4 mg total) by mouth every 8 (eight)  hours as needed for muscle spasms. Patient taking differently: Take 2-4 mg by mouth every 8 (eight) hours as needed for muscle spasms. 02/04/21   Jiles Harold, PA-C  zolpidem (AMBIEN) 10 MG tablet Take 10 mg by mouth at bedtime as needed for sleep (**MUST BE "TORRENT" BRAND**).    [provider]      Allergies    Atorvastatin, Ibuprofen, Earnestine Leys officinalis], Sulfa antibiotics, Avelox [moxifloxacin hcl in nacl], and Indocin [indomethacin]    Review of Systems   Review of Systems  Physical Exam Updated Vital Signs BP (!) 149/105 (BP Location: Right Arm)   Pulse 90   Temp 98.9 F (37.2 C) (Oral)   Resp 16   Ht 5\' 2"  (1.575 m)   Wt 66.2 kg   LMP  (LMP Unknown)   SpO2 98%   BMI 26.70 kg/m  Physical Exam Vitals and nursing note reviewed.  Constitutional:      General: She is not in acute distress.    Appearance: Normal appearance. She is not ill-appearing or diaphoretic.  Cardiovascular:     Rate and Rhythm: Normal rate and regular rhythm.  Pulmonary:     Effort: Pulmonary effort is normal.  Musculoskeletal:     Right wrist: Laceration and tenderness present. No swelling or deformity.     Right hand: Normal capillary refill. Normal pulse.     Comments: 4.5 cm laceration to the right wrist on the palmar side with minimal bleeding.  Appears to be only skin involvement.  Right hand and wrist are neurovascularly intact.  Skin:    General: Skin is warm and dry.     Capillary Refill: Capillary refill takes less than 2 seconds.  Neurological:     Mental Status: She is alert. Mental status is at baseline.  Psychiatric:        Mood and Affect: Mood normal.        Behavior: Behavior normal.    ED Results / Procedures / Treatments   Labs (all labs ordered are listed, but only abnormal results are displayed) Labs Reviewed - No data to display  EKG None  Radiology DG Wrist Complete Right  Result Date: 06/24/2023 CLINICAL DATA:  Laceration to the anterior  right wrist from broken glass. EXAM: RIGHT WRIST - COMPLETE 3+ VIEW COMPARISON:  None Available. FINDINGS: Demineralization. No acute fracture or dislocation. Partially visualized plate and screw fixation of the third proximal phalanx. Degenerative changes at the first [facility] and STT joints. Laceration about the palmar wrist. No radiopaque foreign body. IMPRESSION: No acute fracture or dislocation. Laceration to the palmar wrist.  No radiopaque foreign body. Electronically Signed   By: Minerva Fester M.D.   On: 06/24/2023 21:30    Procedures Procedures  {Document cardiac monitor, telemetry assessment procedure when appropriate:1}  Medications Ordered in ED Medications  lidocaine (PF) (XYLOCAINE) 1 % injection 10 mL (10 mLs Infiltration Given 06/24/23 2231)  oxyCODONE-acetaminophen (PERCOCET/ROXICET) 5-325 MG per tablet 1 tablet (1 tablet Oral Given 06/24/23 2231)  morphine (PF) 4 MG/ML injection 4 mg (4 mg Intramuscular Given 06/24/23 2305)    ED Course/ Medical Decision Making/ A&P   {   Click here for ABCD2, HEART and other calculatorsREFRESH Note before signing :1}                              Medical Decision Making Amount and/or Complexity of Data Reviewed Radiology: ordered.  Risk Prescription drug management.   This patient presents to the ED with chief complaint(s) of right wrist laceration with non-contributory past medical history.  The complaint involves an extensive differential diagnosis and also carries with it a high risk of complications and morbidity.    Initial Assessment:   Exam significant for approximately 4.5 cm laceration to the right wrist on the palmar side with minimal bleeding.  Appears to be only skin involvement.  Right hand and wrist are neurovascularly intact.  Treatment and Reassessment: Patient was given oral pain medicine without much improvement in her symptoms.  Patient given IM injection of morphine with improvement in pain.  Laceration repaired without  any immediate complications.  Disposition:   ***  Social Determinants of Health:   Patient's {[id number]}  increases the complexity of managing their presentation   {Document critical care time when appropriate:1} {Document review of labs and clinical decision tools ie heart score, Chads2Vasc2 etc:1}  {Document your independent review of radiology images, and any outside records:1} {Document your discussion with family members, caretakers, and with consultants:1} {Document social determinants of health affecting pt's care:1} {Document your decision making why or why not admission, treatments were needed:1} Final Clinical Impression(s) / ED Diagnoses Final diagnoses:  None    Rx / DC Orders ED Discharge Orders     None

## 2023-06-24 NOTE — ED Triage Notes (Signed)
Patient BIB EMS for evaluation of laceration to R wrist.  Reports she was attempting to kill a fly on the window and pushed to "smash" it.  Hand went through glass.  Bleeding controlled at this time.  Bandage in place

## 2023-06-25 DIAGNOSIS — S61511A Laceration without foreign body of right wrist, initial encounter: Secondary | ICD-10-CM | POA: Diagnosis not present

## 2023-06-25 MED ORDER — OXYCODONE HCL 5 MG PO TABS: 5.0000 mg | ORAL_TABLET | Freq: Four times a day (QID) | ORAL | 0 refills | Status: DC | PRN

## 2023-06-25 MED ORDER — TETANUS-DIPHTH-ACELL PERTUSSIS 5-2.5-18.5 LF-MCG/0.5 IM SUSY
0.5000 mL | PREFILLED_SYRINGE | Freq: Once | INTRAMUSCULAR | Status: AC
  Administered 2023-06-25: 0.5 mL via INTRAMUSCULAR
  Filled 2023-06-25: qty 0.5

## 2023-06-25 NOTE — Discharge Instructions (Addendum)
Thank you for allowing [place] to be a part of your care today.  You were evaluated in the ED for laceration.  Your laceration was repaired with 11 sutures that do not dissolve.  You will need to return in 7 to 10 days to have these removed.  You may return to the ED, urgent care, or some primary care as we will remove these for you.  I have sent over a short course of pain medicine to use for when you have severe pain.  If you have mild to moderate pain I recommend taking 500 to 1000 mg of Tylenol every 6-8 hours.  Keep the wound dry today, but you may resume normal bathing tomorrow.  I also recommend applying antibiotic ointment to the wound to help keep it moist.  You may keep it covered with a bandage.  Monitor your wound for signs of infection including redness, swelling, increased pain, or drainage.  If you develop signs of infection, seek medical attention immediately.    Return to the ED if you develop sudden worsening  of your symptoms, develop infection, or if you have any new concerns.

## 2023-07-12 ENCOUNTER — Other Ambulatory Visit: Payer: Self-pay | Admitting: Gastroenterology

## 2023-08-23 ENCOUNTER — Ambulatory Visit: Payer: Medicare Other

## 2023-08-23 NOTE — Therapy (Deleted)
OUTPATIENT PHYSICAL THERAPY SHOULDER EVALUATION   Patient Name: Tanya Duncan MRN: [id number] DOB:1946/09/23, 77 y.o., female Today's Date: 08/23/2023  END OF SESSION:   Past Medical History:  Diagnosis Date   Anxiety    Arthritis    Blood transfusion without reported diagnosis 05/2019   with hip surgery   Complication of anesthesia    Fibromyalgia    Fractured pelvis (HCC) 09/15/2020   right   GERD (gastroesophageal reflux disease)    GI bleed 4696,[id number]   x2   Hyperlipidemia    Palpitations    Pneumonia 2005   PONV (postoperative nausea and vomiting)    Pre-diabetes    Stomach ulcer 1990   Past Surgical History:  Procedure Laterality Date   ABDOMINAL HYSTERECTOMY     FRACTURE SURGERY Right 1998   ankle   JOINT REPLACEMENT     LUMBAR FUSION  2015   OPEN REDUCTION INTERNAL FIXATION (ORIF) PROXIMAL PHALANX Right 09/21/2020   Procedure: OPEN TREATMENT OF RIGHT LONG FINGER PROXIMAL PHALANX FRACTURE;  Surgeon: Mack Hook, MD;  Location: [facility] OR;  Service: Orthopedics;  Laterality: Right;  LENGTH OF SURGERY: 75 MIN   ORIF HUMERUS FRACTURE Left    ORIF HUMERUS FRACTURE Left 05/15/2023   Procedure: OPEN REDUCTION INTERNAL FIXATION (ORIF) PERIPROSTHETIC HUMERUS FRACTURE WITH PLATE REMOVAL;  Surgeon: Jones Broom, MD;  Location: [facility];  Service: Orthopedics;  Laterality: Left;   ORIF TIBIA FRACTURE Right 1998   with bone graft   PYLOROPLASTY     REVERSE SHOULDER ARTHROPLASTY Left 02/04/2021   Procedure: REVERSE SHOULDER ARTHROPLASTY WITH HARDWARE REMOVAL;  Surgeon: Jones Broom, MD;  Location: [facility];  Service: Orthopedics;  Laterality: Left;   TOTAL HIP ARTHROPLASTY  2011   TOTAL HIP ARTHROPLASTY Right 05/14/2019   Procedure: Right Anterior Hip Arthroplasty;  Surgeon: Marcene Corning, MD;  Location: [facility];  Service: Orthopedics;  Laterality: Right;   TOTAL SHOULDER REPLACEMENT  2014   Patient Active Problem List   Diagnosis Date Noted   S/P ORIF (open reduction  internal fixation) fracture 05/15/2023   Palpitations 09/01/2020   Chest pain 09/01/2020   Abnormal ECG 09/01/2020   Moderate episode of recurrent major depressive disorder (HCC) 06/22/2020   Prediabetes 06/22/2020   Mixed hyperlipidemia 06/22/2020   History of total right hip arthroplasty 05/17/2019   Anxiety    Primary osteoarthritis of right hip 05/14/2019   Vitamin D deficiency 01/10/2019   Insomnia due to anxiety and fear 10/25/2017   Chronic prescription benzodiazepine use 10/25/2017   Chronic bilateral low back pain without sciatica 04/27/2017   Osteoarthritis 01/06/2017   GAD (generalized anxiety disorder) 01/06/2017   Rheumatoid arthritis involving multiple sites with positive rheumatoid factor (HCC) 12/12/2016   Status post lumbar spinal fusion 12/12/2016   Fibromyalgia 12/12/2016    PCP: Tracey Harries, MD   REFERRING PROVIDER: Jones Broom, MD  REFERRING DIAG: S/p ORIF L periprosthetic Proximal Humerus fracture  THERAPY DIAG:  No diagnosis found.  Rationale for Evaluation and Treatment: Rehabilitation  ONSET DATE: 05/15/23  SUBJECTIVE:  SUBJECTIVE STATEMENT: *** Hand dominance: {MISC; OT HAND DOMINANCE:[id number]}  PERTINENT HISTORY: Hospital Course: Zakera Fiddler is an 77 y.o. female who was admitted 05/15/2023 for operative treatment ofS/P ORIF (open reduction internal fixation) fracture. Patient has severe unremitting pain that affects sleep, daily activities, and work/hobbies. After pre-op clearance the patient was taken to the operating room on 05/15/2023 and underwent  Procedure(s): OPEN REDUCTION INTERNAL FIXATION (ORIF) PERIPROSTHETIC HUMERUS FRACTURE WITH PLATE REMOVAL.    PAIN:  Are you having pain? {OPRCPAIN:27236}  PRECAUTIONS: Shoulder  RED FLAGS: None   WEIGHT  BEARING RESTRICTIONS: No  FALLS:  Has patient fallen in last 6 months? Yes. Number of falls 1  LIVING ENVIRONMENT: Lives with: {[facility] lives with:25569::"lives with their family"} Lives in: {Lives in:25570} Stairs: {opstairs:27293} Has following equipment at home: {Assistive devices:23999}  OCCUPATION: retired  PLOF: Independent and Independent with basic ADLs  PATIENT GOALS:***  NEXT MD VISIT:   OBJECTIVE:  Note: Objective measures were completed at Evaluation unless otherwise noted.  DIAGNOSTIC FINDINGS:  CLINICAL DATA:  Surgery   EXAM: LEFT HUMERUS - 2+ VIEW   COMPARISON:  04/27/2023   FINDINGS: Five low resolution intraoperative spot views of the left shoulder. Total fluoroscopy time was 11 seconds, fluoroscopic dose of 0.89 mGy. Left shoulder replacement with additional plate and fixating screws for previously noted proximal humerus periprosthetic fracture   IMPRESSION: Intraoperative fluoroscopic assistance provided during left shoulder surgery.     Electronically Signed   By: Jasmine Pang M.D.   On: 05/15/2023 19:48  PATIENT SURVEYS:  FOTO ***  POSTURE: ***  UPPER EXTREMITY ROM:   {AROM/PROM:27142} ROM Right eval Left eval  Shoulder flexion    Shoulder extension    Shoulder abduction    Shoulder adduction    Shoulder internal rotation    Shoulder external rotation    Elbow flexion    Elbow extension    Wrist flexion    Wrist extension    Wrist ulnar deviation    Wrist radial deviation    Wrist pronation    Wrist supination    (Blank rows = not tested)  UPPER EXTREMITY MMT:  MMT Right eval Left eval  Shoulder flexion    Shoulder extension    Shoulder abduction    Shoulder adduction    Shoulder internal rotation    Shoulder external rotation    Middle trapezius    Lower trapezius    Elbow flexion    Elbow extension    Wrist flexion    Wrist extension    Wrist ulnar deviation    Wrist radial deviation    Wrist pronation     Wrist supination    Grip strength (lbs)    (Blank rows = not tested)  SHOULDER SPECIAL TESTS: deferred  JOINT MOBILITY TESTING:  N/a  PALPATION:  ***   TODAY'S TREATMENT:  DATE: 08/23/23   PATIENT EDUCATION: Education details: Discussed eval findings, rehab rationale and POC and patient is in agreement  Person educated: Patient Education method: Explanation Education comprehension: verbalized understanding and needs further education  HOME EXERCISE PROGRAM: ***  ASSESSMENT:  CLINICAL IMPRESSION: Patient is a 77 y.o. female who was seen today for physical therapy evaluation and treatment for L shoulder pain, loss of motion and decreased overall function following traumatic fall and subsequent ORIF of periprosthetic humerus sfx.   OBJECTIVE IMPAIRMENTS: decreased activity tolerance, decreased endurance, decreased knowledge of condition, decreased mobility, decreased ROM, decreased strength, impaired perceived functional ability, impaired UE functional use, improper body mechanics, postural dysfunction, and pain.   ACTIVITY LIMITATIONS: carrying, lifting, sleeping, and reach over head  PERSONAL FACTORS: Age, Fitness, Past/current experiences, and 1 comorbidity: fibromyalgia  are also affecting patient's functional outcome.   REHAB POTENTIAL: Good  CLINICAL DECISION MAKING: Evolving/moderate complexity  EVALUATION COMPLEXITY: Moderate   GOALS: Goals reviewed with patient? No  SHORT TERM GOALS: Target date: 09/13/2023    Patient to demonstrate independence in HEP  Baseline: Goal status: INITIAL  2.  *** Baseline:  Goal status: INITIAL  3.  *** Baseline:  Goal status: INITIAL  4.  *** Baseline:  Goal status: INITIAL  5.  *** Baseline:  Goal status: INITIAL  6.  *** Baseline:  Goal status: INITIAL  LONG TERM GOALS:  Target date: 10/04/2023    Patient will score at least ***% on FOTO to signify clinically meaningful improvement in functional abilities.   Baseline:  Goal status: INITIAL  2.  *** Baseline:  Goal status: INITIAL  3.  *** Baseline:  Goal status: INITIAL  4.  *** Baseline:  Goal status: INITIAL  5.  *** Baseline:  Goal status: INITIAL  6.  *** Baseline:  Goal status: INITIAL  PLAN:  PT FREQUENCY: 1-2x/week  PT DURATION: 6 weeks  PLANNED INTERVENTIONS: 97164- PT Re-evaluation, 97110-Therapeutic exercises, 97530- Therapeutic activity, 97112- Neuromuscular re-education, 97535- Self Care, 46962- Manual therapy, 97014- Electrical stimulation (unattended), Dry Needling, Joint mobilization, Cryotherapy, and Moist heat  PLAN FOR NEXT SESSION: .Pola Corn, PT 08/23/2023, 11:17 AM

## 2023-08-25 ENCOUNTER — Other Ambulatory Visit: Payer: Medicare Other

## 2023-09-07 ENCOUNTER — Other Ambulatory Visit: Payer: Self-pay

## 2023-09-07 ENCOUNTER — Ambulatory Visit: Payer: Medicare Other | Attending: Orthopedic Surgery

## 2023-09-07 DIAGNOSIS — M6281 Muscle weakness (generalized): Secondary | ICD-10-CM | POA: Diagnosis present

## 2023-09-07 DIAGNOSIS — M25612 Stiffness of left shoulder, not elsewhere classified: Secondary | ICD-10-CM | POA: Insufficient documentation

## 2023-09-15 ENCOUNTER — Other Ambulatory Visit: Payer: Medicare Other

## 2023-09-20 NOTE — Therapy (Deleted)
OUTPATIENT PHYSICAL THERAPY SHOULDER EVALUATION   Patient Name: Tanya Duncan MRN: [id number] DOB:16-Nov-1945, 77 y.o., female Today's Date: 09/20/2023  END OF SESSION:    Past Medical History:  Diagnosis Date   Anxiety    Arthritis    Blood transfusion without reported diagnosis 05/2019   with hip surgery   Complication of anesthesia    Fibromyalgia    Fractured pelvis (HCC) 09/15/2020   right   GERD (gastroesophageal reflux disease)    GI bleed 4098,1191   x2   Hyperlipidemia    Palpitations    Pneumonia 2005   PONV (postoperative nausea and vomiting)    Pre-diabetes    Stomach ulcer 1990   Past Surgical History:  Procedure Laterality Date   ABDOMINAL HYSTERECTOMY     FRACTURE SURGERY Right 1998   ankle   JOINT REPLACEMENT     LUMBAR FUSION  2015   OPEN REDUCTION INTERNAL FIXATION (ORIF) PROXIMAL PHALANX Right 09/21/2020   Procedure: OPEN TREATMENT OF RIGHT LONG FINGER PROXIMAL PHALANX FRACTURE;  Surgeon: Mack Hook, MD;  Location: [facility] OR;  Service: Orthopedics;  Laterality: Right;  LENGTH OF SURGERY: 75 MIN   ORIF HUMERUS FRACTURE Left    ORIF HUMERUS FRACTURE Left 05/15/2023   Procedure: OPEN REDUCTION INTERNAL FIXATION (ORIF) PERIPROSTHETIC HUMERUS FRACTURE WITH PLATE REMOVAL;  Surgeon: Jones Broom, MD;  Location: [facility];  Service: Orthopedics;  Laterality: Left;   ORIF TIBIA FRACTURE Right 1998   with bone graft   PYLOROPLASTY     REVERSE SHOULDER ARTHROPLASTY Left 02/04/2021   Procedure: REVERSE SHOULDER ARTHROPLASTY WITH HARDWARE REMOVAL;  Surgeon: Jones Broom, MD;  Location: [facility];  Service: Orthopedics;  Laterality: Left;   TOTAL HIP ARTHROPLASTY  2011   TOTAL HIP ARTHROPLASTY Right 05/14/2019   Procedure: Right Anterior Hip Arthroplasty;  Surgeon: Marcene Corning, MD;  Location: [facility];  Service: Orthopedics;  Laterality: Right;   TOTAL SHOULDER REPLACEMENT  2014   Patient Active Problem List   Diagnosis Date Noted   S/P ORIF (open reduction  internal fixation) fracture 05/15/2023   Palpitations 09/01/2020   Chest pain 09/01/2020   Abnormal ECG 09/01/2020   Moderate episode of recurrent major depressive disorder (HCC) 06/22/2020   Prediabetes 06/22/2020   Mixed hyperlipidemia 06/22/2020   History of total right hip arthroplasty 05/17/2019   Anxiety    Primary osteoarthritis of right hip 05/14/2019   Vitamin D deficiency 01/10/2019   Insomnia due to anxiety and fear 10/25/2017   Chronic prescription benzodiazepine use 10/25/2017   Chronic bilateral low back pain without sciatica 04/27/2017   Osteoarthritis 01/06/2017   GAD (generalized anxiety disorder) 01/06/2017   Rheumatoid arthritis involving multiple sites with positive rheumatoid factor (HCC) 12/12/2016   Status post lumbar spinal fusion 12/12/2016   Fibromyalgia 12/12/2016    PCP: Tracey Harries, MD   REFERRING PROVIDER: Jones Broom, MD  REFERRING DIAG: S/p ORIF L periprosthetic Proximal Humerus fracture  THERAPY DIAG:  No diagnosis found.  Rationale for Evaluation and Treatment: Rehabilitation  ONSET DATE: 05/15/23 Surgery  SUBJECTIVE:  SUBJECTIVE STATEMENT: Pt reports she is currently having significant limitations with the use of her L UE with above shoulder movements. Pt reports being in a sling for 9 weeks since her surgical repair. Her L shoulder replacement was originally completed in 2021. Pt would like to regain the functional use of her L UE for reaching above shoulder, hygiene, and dressing.  Hand dominance: Right  PERTINENT HISTORY: Hospital Course: Tanya Duncan is an 77 y.o. female who was admitted 05/15/2023 for operative treatment ofS/P ORIF (open reduction internal fixation) fracture. Patient has severe unremitting pain that affects sleep, daily activities, and  work/hobbies. After pre-op clearance the patient was taken to the operating room on 05/15/2023 and underwent  Procedure(s): OPEN REDUCTION INTERNAL FIXATION (ORIF) PERIPROSTHETIC HUMERUS FRACTURE WITH PLATE REMOVAL.    PAIN:  Are you having pain? No  PRECAUTIONS: Shoulder  RED FLAGS: None   WEIGHT BEARING RESTRICTIONS: No  FALLS:  Has patient fallen in last 6 months? Yes. Number of falls 1  LIVING ENVIRONMENT: Lives with: lives alone Lives in: House/apartment No issue accessing her home or with mobility within  OCCUPATION: retired  PLOF: Independent and Independent with basic ADLs  PATIENT GOALS: To regain  NEXT MD VISIT:   OBJECTIVE:  Note: Objective measures were completed at Evaluation unless otherwise noted.  DIAGNOSTIC FINDINGS:  CLINICAL DATA:  Surgery   EXAM: LEFT HUMERUS - 2+ VIEW   COMPARISON:  04/27/2023   FINDINGS: Five low resolution intraoperative spot views of the left shoulder. Total fluoroscopy time was 11 seconds, fluoroscopic dose of 0.89 mGy. Left shoulder replacement with additional plate and fixating screws for previously noted proximal humerus periprosthetic fracture   IMPRESSION: Intraoperative fluoroscopic assistance provided during left shoulder surgery.  PATIENT SURVEYS:  FOTO: Perceived function   53%, predicted   60%   POSTURE: Scoliosis c significant lat R shift, increased thoracic kyphosis and forward head  UPPER EXTREMITY ROM:   Active ROM Right eval Left eval  Shoulder flexion A145 A65, AA wall 125, P145  Shoulder extension    Shoulder abduction A145 A50, AA wall 90, P130  Shoulder adduction    Shoulder internal rotation A T7 A T7, 90  Shoulder external rotation A T3 A occiput, 45  Elbow flexion    Elbow extension    Wrist flexion    Wrist extension    Wrist ulnar deviation    Wrist radial deviation    Wrist pronation    Wrist supination    (Blank rows = not tested)  UPPER EXTREMITY MMT:  MMT Right eval  Left eval  Shoulder flexion  2  Shoulder extension  3  Shoulder abduction  2  Shoulder adduction  3  Shoulder internal rotation  3  Shoulder external rotation  2  Middle trapezius    Lower trapezius    Elbow flexion    Elbow extension    Wrist flexion    Wrist extension    Wrist ulnar deviation    Wrist radial deviation    Wrist pronation    Wrist supination    Grip strength (lbs)    (Blank rows = not tested)  SHOULDER SPECIAL TESTS: deferred  JOINT MOBILITY TESTING:  N/a  PALPATION:  Not TTP   TODAY'S TREATMENT:     [facility] Adult PT Treatment:  DATE: 09/07/23 Therapeutic Exercise: Developed, instructed in, and pt completed therex as noted in HEP  Self Care: Use of heating pad or cold pack for increased pain                                                                                                                                     PATIENT EDUCATION: Education details: Discussed eval findings, rehab rationale and POC and patient is in agreement  Person educated: Patient Education method: Explanation Education comprehension: verbalized understanding and needs further education  HOME EXERCISE PROGRAM: Access Code: [email or web address] URL: [email or web address] Date: 09/07/2023 Prepared by: Joellyn Rued  Exercises - Supine Shoulder Flexion Extension AAROM with Dowel  - 2 x daily - 7 x weekly - 1-2 sets - 10 reps - 3 hold - Supine Shoulder Protraction with Dowel  - 2 x daily - 7 x weekly - 1-2 sets - 10 reps - 3 hold - Standing Single Arm Shoulder Flexion Stretch on Wall  - 2 x daily - 7 x weekly - 1-2 sets - 10 reps - 3 hold - Standing Shoulder Abduction Slides at Wall  - 2 x daily - 7 x weekly - 1-2 sets - 10 reps - 3 hold - Shoulder External Rotation and Scapular Retraction with Resistance  - 2 x daily - 7 x weekly - 1-2 sets - 10 reps - 3 hold - Standing Shoulder Row with Anchored Resistance  - 2 x daily - 7 x  weekly - 1-2 sets - 10 reps - 3 hold  ASSESSMENT:  CLINICAL IMPRESSION: Patient is a 77 y.o. female who was seen today for physical therapy evaluation and treatment for L shoulder loss of motion, decreased strength, and overall decreased function following a traumatic fall and subsequent ORIF of periprosthetic humerus sfx. A HEP was initiated for ROM and strengthening. Pt will benefit from skilled PT to address impairments to optimize L shoulder/UE.Marland Kitchen    OBJECTIVE IMPAIRMENTS: decreased activity tolerance, decreased endurance, decreased knowledge of condition, decreased mobility, decreased ROM, decreased strength, impaired perceived functional ability, impaired UE functional use, improper body mechanics, postural dysfunction, and pain.   ACTIVITY LIMITATIONS: carrying, lifting, sleeping, and reach over head  PERSONAL FACTORS: Age, Fitness, Past/current experiences, and 1 comorbidity: fibromyalgia  are also affecting patient's functional outcome.   REHAB POTENTIAL: Good  CLINICAL DECISION MAKING: Evolving/moderate complexity  EVALUATION COMPLEXITY: Moderate   GOALS: Goals reviewed with patient? No  SHORT TERM GOALS: Target date: 09/29/2023    Patient to demonstrate independence in HEP  Baseline: Goal status: INITIAL  2.  Increased L shoulder AROM for flexion to 100d and abd to 80d for progression toward functional LTGs Baseline:  Goal status: INITIAL   LONG TERM GOALS: Target date: 11/10/2023    Patient will score at least 60% on FOTO to signify clinically meaningful improvement in functional abilities.   Baseline: 53% Goal status: INITIAL  2.  Pt will  be Ind in a final HEP to maintain achieved LOF  Baseline: started Goal status: INITIAL  3.  Increased L shoulder strength to 3+ or greater in order for the pt if lift 2lbs above shoulder height for in home tasks Baseline: see flow sheets Goal status: INITIAL  4.  Increase L shoulder AROM to flexion 125d, abd 90d, and ER  to T1 or greater for improved L UE ability for reaching above shoulder, dressing, and hygiene Baseline: see flow sheets Goal status: INITIAL   PLAN:  PT FREQUENCY: 2x/week  PT DURATION: 8 weeks  PLANNED INTERVENTIONS: 97164- PT Re-evaluation, 97110-Therapeutic exercises, 97530- Therapeutic activity, 97112- Neuromuscular re-education, 97535- Self Care, 56433- Manual therapy, 97014- Electrical stimulation (unattended), Dry Needling, Joint mobilization, Cryotherapy, and Moist heat  PLAN FOR NEXT SESSION: Review FOTO; assess response to HEP; progress therex as indicated; use of modalities, manual therapy; and TPDN as indicated.    Allen Ralls MS, PT 09/20/23 9:26 AM

## 2023-09-22 ENCOUNTER — Ambulatory Visit: Payer: Medicare Other

## 2023-09-25 NOTE — Therapy (Signed)
OUTPATIENT PHYSICAL THERAPY SHOULDER TREAMENT   Patient Name: Tanya Duncan MRN: [id number] DOB:13-Jan-1946, 77 y.o., female Today's Date: 09/26/2023  END OF SESSION:  PT End of Session - 09/26/23 1337     Visit Number 2    Date for PT Re-Evaluation 11/10/23    Authorization Type MEDICARE PART A AND B    PT Start Time 1334    PT Stop Time 1414    PT Time Calculation (min) 40 min    Activity Tolerance Patient tolerated treatment well    Behavior During Therapy [facility] for tasks assessed/performed              Past Medical History:  Diagnosis Date   Anxiety    Arthritis    Blood transfusion without reported diagnosis 05/2019   with hip surgery   Complication of anesthesia    Fibromyalgia    Fractured pelvis (HCC) 09/15/2020   right   GERD (gastroesophageal reflux disease)    GI bleed 0865,7846   x2   Hyperlipidemia    Palpitations    Pneumonia 2005   PONV (postoperative nausea and vomiting)    Pre-diabetes    Stomach ulcer 1990   Past Surgical History:  Procedure Laterality Date   ABDOMINAL HYSTERECTOMY     FRACTURE SURGERY Right 1998   ankle   JOINT REPLACEMENT     LUMBAR FUSION  2015   OPEN REDUCTION INTERNAL FIXATION (ORIF) PROXIMAL PHALANX Right 09/21/2020   Procedure: OPEN TREATMENT OF RIGHT LONG FINGER PROXIMAL PHALANX FRACTURE;  Surgeon: Mack Hook, MD;  Location: [facility] OR;  Service: Orthopedics;  Laterality: Right;  LENGTH OF SURGERY: 75 MIN   ORIF HUMERUS FRACTURE Left    ORIF HUMERUS FRACTURE Left 05/15/2023   Procedure: OPEN REDUCTION INTERNAL FIXATION (ORIF) PERIPROSTHETIC HUMERUS FRACTURE WITH PLATE REMOVAL;  Surgeon: Jones Broom, MD;  Location: [facility] ORS;  Service: Orthopedics;  Laterality: Left;   ORIF TIBIA FRACTURE Right 1998   with bone graft   PYLOROPLASTY     REVERSE SHOULDER ARTHROPLASTY Left 02/04/2021   Procedure: REVERSE SHOULDER ARTHROPLASTY WITH HARDWARE REMOVAL;  Surgeon: Jones Broom, MD;  Location: [facility] ORS;  Service:  Orthopedics;  Laterality: Left;   TOTAL HIP ARTHROPLASTY  2011   TOTAL HIP ARTHROPLASTY Right 05/14/2019   Procedure: Right Anterior Hip Arthroplasty;  Surgeon: Marcene Corning, MD;  Location: [facility] ORS;  Service: Orthopedics;  Laterality: Right;   TOTAL SHOULDER REPLACEMENT  2014   Patient Active Problem List   Diagnosis Date Noted   S/P ORIF (open reduction internal fixation) fracture 05/15/2023   Palpitations 09/01/2020   Chest pain 09/01/2020   Abnormal ECG 09/01/2020   Moderate episode of recurrent major depressive disorder (HCC) 06/22/2020   Prediabetes 06/22/2020   Mixed hyperlipidemia 06/22/2020   History of total right hip arthroplasty 05/17/2019   Anxiety    Primary osteoarthritis of right hip 05/14/2019   Vitamin D deficiency 01/10/2019   Insomnia due to anxiety and fear 10/25/2017   Chronic prescription benzodiazepine use 10/25/2017   Chronic bilateral low back pain without sciatica 04/27/2017   Osteoarthritis 01/06/2017   GAD (generalized anxiety disorder) 01/06/2017   Rheumatoid arthritis involving multiple sites with positive rheumatoid factor (HCC) 12/12/2016   Status post lumbar spinal fusion 12/12/2016   Fibromyalgia 12/12/2016    PCP: Tracey Harries, MD   REFERRING PROVIDER: Jones Broom, MD  REFERRING DIAG: S/p ORIF L periprosthetic Proximal Humerus fracture  THERAPY DIAG:  Muscle weakness (generalized)  Stiffness of left shoulder, not elsewhere classified  Rationale for Evaluation and Treatment: Rehabilitation  ONSET DATE: 05/15/23 Surgery  SUBJECTIVE:                                                                                                                                                                                      SUBJECTIVE STATEMENT: Pt reports she is in a rheumatoid flare up and her l shoulder has been hurting more.  EVAL: Pt reports she is currently having significant limitations with the use of her L UE with above shoulder  movements. Pt reports being in a sling for 9 weeks since her surgical repair. Her L shoulder replacement was originally completed in 2021. Pt would like to regain the functional use of her L UE for reaching above shoulder, hygiene, and dressing.  Hand dominance: Right  PERTINENT HISTORY: Hospital Course: Tanya Duncan is an 77 y.o. female who was admitted 05/15/2023 for operative treatment ofS/P ORIF (open reduction internal fixation) fracture. Patient has severe unremitting pain that affects sleep, daily activities, and work/hobbies. After pre-op clearance the patient was taken to the operating room on 05/15/2023 and underwent  Procedure(s): OPEN REDUCTION INTERNAL FIXATION (ORIF) PERIPROSTHETIC HUMERUS FRACTURE WITH PLATE REMOVAL.    PAIN:  Are you having pain? No With elevation 8/10 lateral GH area  PRECAUTIONS: Shoulder  RED FLAGS: None   WEIGHT BEARING RESTRICTIONS: No  FALLS:  Has patient fallen in last 6 months? Yes. Number of falls 1  LIVING ENVIRONMENT: Lives with: lives alone Lives in: House/apartment No issue accessing her home or with mobility within  OCCUPATION: retired  PLOF: Independent and Independent with basic ADLs  PATIENT GOALS: To regain  NEXT MD VISIT:   OBJECTIVE:  Note: Objective measures were completed at Evaluation unless otherwise noted.  DIAGNOSTIC FINDINGS:  CLINICAL DATA:  Surgery   EXAM: LEFT HUMERUS - 2+ VIEW   COMPARISON:  04/27/2023   FINDINGS: Five low resolution intraoperative spot views of the left shoulder. Total fluoroscopy time was 11 seconds, fluoroscopic dose of 0.89 mGy. Left shoulder replacement with additional plate and fixating screws for previously noted proximal humerus periprosthetic fracture   IMPRESSION: Intraoperative fluoroscopic assistance provided during left shoulder surgery.  PATIENT SURVEYS:  FOTO: Perceived function   53%, predicted   60%   POSTURE: Scoliosis c significant lat R shift, increased  thoracic kyphosis and forward head  UPPER EXTREMITY ROM:   Active ROM Right eval Left eval  Shoulder flexion A145 A65, AA wall 125, P145  Shoulder extension    Shoulder abduction A145 A50, AA wall 90, P130  Shoulder adduction    Shoulder internal rotation A T7 A T7, 90  Shoulder external rotation A T3  A occiput, 45  Elbow flexion    Elbow extension    Wrist flexion    Wrist extension    Wrist ulnar deviation    Wrist radial deviation    Wrist pronation    Wrist supination    (Blank rows = not tested)  UPPER EXTREMITY MMT:  MMT Right eval Left eval  Shoulder flexion  2  Shoulder extension  3  Shoulder abduction  2  Shoulder adduction  3  Shoulder internal rotation  3  Shoulder external rotation  2  Middle trapezius    Lower trapezius    Elbow flexion    Elbow extension    Wrist flexion    Wrist extension    Wrist ulnar deviation    Wrist radial deviation    Wrist pronation    Wrist supination    Grip strength (lbs)    (Blank rows = not tested)  SHOULDER SPECIAL TESTS: deferred  JOINT MOBILITY TESTING:  N/a  PALPATION:  Not TTP   TODAY'S TREATMENT:   [facility] Adult PT Treatment:                                                DATE: 09/26/23 Therapeutic Exercise: Pulley 2 mins Shoulder ladder x10 Standing shoulder row 2x10 GTB Standing bilat shoulder ER 2x10 YTB Supine Shoulder pullovers c wand x10 Supine Chest press c wand x10 Supine shoulder abd c wand x10 Supine shoulder flexion x10 S/L shoulder ER x10 S/L shoulder Adb x10    [facility] Adult PT Treatment:                                                DATE: 09/07/23 Therapeutic Exercise: Developed, instructed in, and pt completed therex as noted in HEP  Self Care: Use of heating pad or cold pack for increased pain                                                                                                                                     PATIENT EDUCATION: Education details: Discussed eval  findings, rehab rationale and POC and patient is in agreement  Person educated: Patient Education method: Explanation Education comprehension: verbalized understanding and needs further education  HOME EXERCISE PROGRAM: Access Code: [email or web address] URL: [email or web address] Date: 09/07/2023 Prepared by: Joellyn Rued  Exercises - Supine Shoulder Flexion Extension AAROM with Dowel  - 2 x daily - 7 x weekly - 1-2 sets - 10 reps - 3 hold - Supine Shoulder Protraction with Dowel  - 2 x daily - 7 x weekly - 1-2 sets - 10 reps - 3 hold - Standing Single Arm Shoulder Flexion  Stretch on Wall  - 2 x daily - 7 x weekly - 1-2 sets - 10 reps - 3 hold - Standing Shoulder Abduction Slides at Wall  - 2 x daily - 7 x weekly - 1-2 sets - 10 reps - 3 hold - Shoulder External Rotation and Scapular Retraction with Resistance  - 2 x daily - 7 x weekly - 1-2 sets - 10 reps - 3 hold - Standing Shoulder Row with Anchored Resistance  - 2 x daily - 7 x weekly - 1-2 sets - 10 reps - 3 hold  ASSESSMENT:  CLINICAL IMPRESSION: PT was completed PT for L shoulder ROM. Pt demonstrates good AAROM, and is able to complete full active movements in gravity reduced positions. Pt tolerated PT today without adverse effects. Pt will continue to benefit from skilled PT to address impairments for improved function.    EVAL: Patient is a 77 y.o. female who was seen today for physical therapy evaluation and treatment for L shoulder loss of motion, decreased strength, and overall decreased function following a traumatic fall and subsequent ORIF of periprosthetic humerus sfx. A HEP was initiated for ROM and strengthening. Pt will benefit from skilled PT to address impairments to optimize L shoulder/UE.Marland Kitchen    OBJECTIVE IMPAIRMENTS: decreased activity tolerance, decreased endurance, decreased knowledge of condition, decreased mobility, decreased ROM, decreased strength, impaired perceived functional ability, impaired UE functional  use, improper body mechanics, postural dysfunction, and pain.   ACTIVITY LIMITATIONS: carrying, lifting, sleeping, and reach over head  PERSONAL FACTORS: Age, Fitness, Past/current experiences, and 1 comorbidity: fibromyalgia  are also affecting patient's functional outcome.   REHAB POTENTIAL: Good  CLINICAL DECISION MAKING: Evolving/moderate complexity  EVALUATION COMPLEXITY: Moderate   GOALS: Goals reviewed with patient? No  SHORT TERM GOALS: Target date: 09/29/2023    Patient to demonstrate independence in HEP  Baseline: Goal status: Ongoing  2.  Increased L shoulder AROM for flexion to 100d and abd to 80d for progression toward functional LTGs Baseline:  Goal status: INITIAL   LONG TERM GOALS: Target date: 11/10/2023    Patient will score at least 60% on FOTO to signify clinically meaningful improvement in functional abilities.   Baseline: 53% Goal status: INITIAL  2.  Pt will be Ind in a final HEP to maintain achieved LOF  Baseline: started Goal status: INITIAL  3.  Increased L shoulder strength to 3+ or greater in order for the pt if lift 2lbs above shoulder height for in home tasks Baseline: see flow sheets Goal status: INITIAL  4.  Increase L shoulder AROM to flexion 125d, abd 90d, and ER to T1 or greater for improved L UE ability for reaching above shoulder, dressing, and hygiene Baseline: see flow sheets Goal status: INITIAL   PLAN:  PT FREQUENCY: 2x/week  PT DURATION: 8 weeks  PLANNED INTERVENTIONS: 97164- PT Re-evaluation, 97110-Therapeutic exercises, 97530- Therapeutic activity, 97112- Neuromuscular re-education, 97535- Self Care, 35573- Manual therapy, 97014- Electrical stimulation (unattended), Dry Needling, Joint mobilization, Cryotherapy, and Moist heat  PLAN FOR NEXT SESSION: Review FOTO; assess response to HEP; progress therex as indicated; use of modalities, manual therapy; and TPDN as indicated.    Aurora Rody MS, PT 09/26/23 2:20 PM

## 2023-09-26 ENCOUNTER — Ambulatory Visit: Payer: Medicare Other | Attending: Orthopedic Surgery

## 2023-09-26 DIAGNOSIS — M6281 Muscle weakness (generalized): Secondary | ICD-10-CM | POA: Insufficient documentation

## 2023-09-26 DIAGNOSIS — M25612 Stiffness of left shoulder, not elsewhere classified: Secondary | ICD-10-CM | POA: Diagnosis present

## 2023-09-28 ENCOUNTER — Ambulatory Visit: Payer: Medicare Other

## 2023-09-28 DIAGNOSIS — M25612 Stiffness of left shoulder, not elsewhere classified: Secondary | ICD-10-CM

## 2023-09-28 DIAGNOSIS — M6281 Muscle weakness (generalized): Secondary | ICD-10-CM | POA: Diagnosis not present

## 2023-09-28 NOTE — Therapy (Signed)
OUTPATIENT PHYSICAL THERAPY SHOULDER TREATMENT   Patient Name: Tanya Duncan MRN: [id number] DOB:1945/11/30, 77 y.o., female Today's Date: 09/28/2023  END OF SESSION:  PT End of Session - 09/28/23 1425     Visit Number 3    Number of Visits 17    Date for PT Re-Evaluation 11/10/23    Authorization Type MEDICARE PART A AND B    PT Start Time 1418    PT Stop Time 1457    PT Time Calculation (min) 39 min    Activity Tolerance Patient tolerated treatment well    Behavior During Therapy [facility] for tasks assessed/performed               Past Medical History:  Diagnosis Date   Anxiety    Arthritis    Blood transfusion without reported diagnosis 05/2019   with hip surgery   Complication of anesthesia    Fibromyalgia    Fractured pelvis (HCC) 09/15/2020   right   GERD (gastroesophageal reflux disease)    GI bleed 4401,0272   x2   Hyperlipidemia    Palpitations    Pneumonia 2005   PONV (postoperative nausea and vomiting)    Pre-diabetes    Stomach ulcer 1990   Past Surgical History:  Procedure Laterality Date   ABDOMINAL HYSTERECTOMY     FRACTURE SURGERY Right 1998   ankle   JOINT REPLACEMENT     LUMBAR FUSION  2015   OPEN REDUCTION INTERNAL FIXATION (ORIF) PROXIMAL PHALANX Right 09/21/2020   Procedure: OPEN TREATMENT OF RIGHT LONG FINGER PROXIMAL PHALANX FRACTURE;  Surgeon: Mack Hook, MD;  Location: [facility] OR;  Service: Orthopedics;  Laterality: Right;  LENGTH OF SURGERY: 75 MIN   ORIF HUMERUS FRACTURE Left    ORIF HUMERUS FRACTURE Left 05/15/2023   Procedure: OPEN REDUCTION INTERNAL FIXATION (ORIF) PERIPROSTHETIC HUMERUS FRACTURE WITH PLATE REMOVAL;  Surgeon: Jones Broom, MD;  Location: [facility] OR[facility];  Service: Orthopedics;  Laterality: Left;   ORIF TIBIA FRACTURE Right 1998   with bone graft   PYLOROPLASTY     REVERSE SHOULDER ARTHROPLASTY Left 02/04/2021   Procedure: REVERSE SHOULDER ARTHROPLASTY WITH HARDWARE REMOVAL;  Surgeon: Jones Broom, MD;  Location: [facility]  ORS;  Service: Orthopedics;  Laterality: Left;   TOTAL HIP ARTHROPLASTY  2011   TOTAL HIP ARTHROPLASTY Right 05/14/2019   Procedure: Right Anterior Hip Arthroplasty;  Surgeon: Marcene Corning, MD;  Location: [facility] OR[facility];  Service: Orthopedics;  Laterality: Right;   TOTAL SHOULDER REPLACEMENT  2014   Patient Active Problem List   Diagnosis Date Noted   S/P ORIF (open reduction internal fixation) fracture 05/15/2023   Palpitations 09/01/2020   Chest pain 09/01/2020   Abnormal ECG 09/01/2020   Moderate episode of recurrent major depressive disorder (HCC) 06/22/2020   Prediabetes 06/22/2020   Mixed hyperlipidemia 06/22/2020   History of total right hip arthroplasty 05/17/2019   Anxiety    Primary osteoarthritis of right hip 05/14/2019   Vitamin D deficiency 01/10/2019   Insomnia due to anxiety and fear 10/25/2017   Chronic prescription benzodiazepine use 10/25/2017   Chronic bilateral low back pain without sciatica 04/27/2017   Osteoarthritis 01/06/2017   GAD (generalized anxiety disorder) 01/06/2017   Rheumatoid arthritis involving multiple sites with positive rheumatoid factor (HCC) 12/12/2016   Status post lumbar spinal fusion 12/12/2016   Fibromyalgia 12/12/2016    PCP: Tracey Harries, MD   REFERRING PROVIDER: Jones Broom, MD  REFERRING DIAG: S/p ORIF L periprosthetic Proximal Humerus fracture  THERAPY DIAG:  Muscle weakness (generalized)  Stiffness of left shoulder, not elsewhere classified  Rationale for Evaluation and Treatment: Rehabilitation  ONSET DATE: 05/15/23 Surgery  SUBJECTIVE:                                                                                                                                                                                      SUBJECTIVE STATEMENT: Pt reports she did not have an issue completing the therex during the last PT session for her L shoulder, but yesterday she experienced an increase in L mid back pain. Pt states overhead use  of her L arm tends to aggravate her L mid back.   EVAL: Pt reports she is currently having significant limitations with the use of her L UE with above shoulder movements. Pt reports being in a sling for 9 weeks since her surgical repair. Her L shoulder replacement was originally completed in 2021. Pt would like to regain the functional use of her L UE for reaching above shoulder, hygiene, and dressing.  Hand dominance: Right  PERTINENT HISTORY: Hospital Course: [name] is an 77 y.o. female who was admitted 05/15/2023 for operative treatment ofS/P ORIF (open reduction internal fixation) fracture. Patient has severe unremitting pain that affects sleep, daily activities, and work/hobbies. After pre-op clearance the patient was taken to the operating room on 05/15/2023 and underwent  Procedure(s): OPEN REDUCTION INTERNAL FIXATION (ORIF) PERIPROSTHETIC HUMERUS FRACTURE WITH PLATE REMOVAL.    PAIN:  Are you having pain? No With elevation 8/10 lateral GH area  PRECAUTIONS: Shoulder  RED FLAGS: None   WEIGHT BEARING RESTRICTIONS: No  FALLS:  Has patient fallen in last 6 months? Yes. Number of falls 1  LIVING ENVIRONMENT: Lives with: lives alone Lives in: House/apartment No issue accessing her home or with mobility within  OCCUPATION: retired  PLOF: Independent and Independent with basic ADLs  PATIENT GOALS: To regain  NEXT MD VISIT:   OBJECTIVE:  Note: Objective measures were completed at Evaluation unless otherwise noted.  DIAGNOSTIC FINDINGS:  CLINICAL DATA:  Surgery   EXAM: LEFT HUMERUS - 2+ VIEW   COMPARISON:  04/27/2023   FINDINGS: Five low resolution intraoperative spot views of the left shoulder. Total fluoroscopy time was 11 seconds, fluoroscopic dose of 0.89 mGy. Left shoulder replacement with additional plate and fixating screws for previously noted proximal humerus periprosthetic fracture   IMPRESSION: Intraoperative fluoroscopic assistance provided during  left shoulder surgery.  PATIENT SURVEYS:  FOTO: Perceived function   53%, predicted   60%   POSTURE: Scoliosis c significant lat R shift, increased thoracic kyphosis and forward head  UPPER EXTREMITY ROM:   Active ROM Right eval Left eval LT 09/28/23  Shoulder flexion  A145 A65, AA wall 125, P145 A65  Shoulder extension     Shoulder abduction A145 A50, AA wall 90, P130   Shoulder adduction     Shoulder internal rotation A T7 A T7, 90   Shoulder external rotation A T3 A occiput, 45   Elbow flexion     Elbow extension     Wrist flexion     Wrist extension     Wrist ulnar deviation     Wrist radial deviation     Wrist pronation     Wrist supination     (Blank rows = not tested)  UPPER EXTREMITY MMT:  MMT Right eval Left eval  Shoulder flexion  2  Shoulder extension  3  Shoulder abduction  2  Shoulder adduction  3  Shoulder internal rotation  3  Shoulder external rotation  2  Middle trapezius    Lower trapezius    Elbow flexion    Elbow extension    Wrist flexion    Wrist extension    Wrist ulnar deviation    Wrist radial deviation    Wrist pronation    Wrist supination    Grip strength (lbs)    (Blank rows = not tested)  SHOULDER SPECIAL TESTS: deferred  JOINT MOBILITY TESTING:  N/a  PALPATION:  Not TTP   TODAY'S TREATMENT:   [facility] Adult PT Treatment:                                                DATE: 09/28/23 Therapeutic Exercise: Pulley 2 mins Counter slide x10 for shoulder flexion Standing shoulder row 2x10 RTB Standing bilat shoulder ER 2x10 YTB Supine Shoulder pullovers c wand x10 Supine Chest press c wand x10 Supine shoulder abd c wand x10 Supine shoulder flexion x10 S/L shoulder ER x10 S/L shoulder Adb x10  [facility] Adult PT Treatment:                                                DATE: 09/26/23 Therapeutic Exercise: Pulley 2 mins Shoulder ladder x10 Standing shoulder row 2x10 GTB Standing bilat shoulder ER 2x10 YTB Supine Shoulder  pullovers c wand x10 Supine Chest press c wand x10 Supine shoulder abd c wand x10 Supine shoulder flexion x10 S/L shoulder ER x10 S/L shoulder Adb x10    [facility] Adult PT Treatment:                                                DATE: 09/07/23 Therapeutic Exercise: Developed, instructed in, and pt completed therex as noted in HEP  Self Care: Use of heating pad or cold pack for increased pain  PATIENT EDUCATION: Education details: Discussed eval findings, rehab rationale and POC and patient is in agreement  Person educated: Patient Education method: Explanation Education comprehension: verbalized understanding and needs further education  HOME EXERCISE PROGRAM: Access Code: [email or web address] URL: [email or web address] Date: 09/28/2023 Prepared by: Joellyn Rued  Exercises - Supine Shoulder Flexion Extension AAROM with Dowel  - 1-2 x daily - 7 x weekly - 1-2 sets - 10 reps - 3 hold - Supine Shoulder Protraction with Dowel  - 1-2 x daily - 7 x weekly - 1-2 sets - 10 reps - 3 hold - Seated Shoulder Flexion Towel Slide at Table Top Full Range of Motion  - 1-2 x daily - 7 x weekly - 1-2 sets - 10 reps - 3 hold - Standing Single Arm Shoulder Flexion Stretch on Wall  - 1-2 x daily - 7 x weekly - 1-2 sets - 10 reps - 3 hold - Standing Shoulder Abduction Slides at Wall  - 1-2 x daily - 7 x weekly - 1-2 sets - 10 reps - 3 hold - Shoulder External Rotation and Scapular Retraction with Resistance  - 1-2 x daily - 7 x weekly - 1-2 sets - 10 reps - 3 hold - Standing Shoulder Row with Anchored Resistance  - 1-2 x daily - 7 x weekly - 1-2 sets - 10 reps - 3 hold  ASSESSMENT:  CLINICAL IMPRESSION: Counter top slides for L shoulder flexion were completed to subsitute for wall slides. Pt reports tolerating well the counter top slides better. PT was continued for L  shoulder ROM and strengthening. Pt is able to complete active L shoulder movements with good ROM c gravity reduced therex. Pt will continue to benefit from skilled PT to address impairments for improved L shoulder function.    EVAL: Patient is a 77 y.o. female who was seen today for physical therapy evaluation and treatment for L shoulder loss of motion, decreased strength, and overall decreased function following a traumatic fall and subsequent ORIF of periprosthetic humerus sfx. A HEP was initiated for ROM and strengthening. Pt will benefit from skilled PT to address impairments to optimize L shoulder/UE.Marland Kitchen    OBJECTIVE IMPAIRMENTS: decreased activity tolerance, decreased endurance, decreased knowledge of condition, decreased mobility, decreased ROM, decreased strength, impaired perceived functional ability, impaired UE functional use, improper body mechanics, postural dysfunction, and pain.   ACTIVITY LIMITATIONS: carrying, lifting, sleeping, and reach over head  PERSONAL FACTORS: Age, Fitness, Past/current experiences, and 1 comorbidity: fibromyalgia  are also affecting patient's functional outcome.   REHAB POTENTIAL: Good  CLINICAL DECISION MAKING: Evolving/moderate complexity  EVALUATION COMPLEXITY: Moderate   GOALS: Goals reviewed with patient? No  SHORT TERM GOALS: Target date: 09/29/2023    Patient to demonstrate independence in HEP  Baseline: Goal status: MET  2.  Increased L shoulder AROM for flexion to 100d and abd to 80d for progression toward functional LTGs Baseline:  Goal status: NOT MET   LONG TERM GOALS: Target date: 11/10/2023    Patient will score at least 60% on FOTO to signify clinically meaningful improvement in functional abilities.   Baseline: 53% Goal status: INITIAL  2.  Pt will be Ind in a final HEP to maintain achieved LOF  Baseline: started Goal status: INITIAL  3.  Increased L shoulder strength to 3+ or greater in order for the pt if lift 2lbs  above shoulder height for in home tasks Baseline: see flow sheets Goal status: INITIAL  4.  Increase L shoulder AROM to flexion 125d,  abd 90d, and ER to T1 or greater for improved L UE ability for reaching above shoulder, dressing, and hygiene Baseline: see flow sheets Goal status: INITIAL   PLAN:  PT FREQUENCY: 2x/week  PT DURATION: 8 weeks  PLANNED INTERVENTIONS: 97164- PT Re-evaluation, 97110-Therapeutic exercises, 97530- Therapeutic activity, 97112- Neuromuscular re-education, 97535- Self Care, 16109- Manual therapy, 97014- Electrical stimulation (unattended), Dry Needling, Joint mobilization, Cryotherapy, and Moist heat  PLAN FOR NEXT SESSION: Review FOTO; assess response to HEP; progress therex as indicated; use of modalities, manual therapy; and TPDN as indicated.    Bekah Igoe MS, PT 09/28/23 4:52 PM

## 2023-10-03 ENCOUNTER — Ambulatory Visit: Payer: Medicare Other

## 2023-10-09 NOTE — Therapy (Signed)
OUTPATIENT PHYSICAL THERAPY SHOULDER TREATMENT   Patient Name: Tanya Duncan MRN: [id number] DOB:October 04, 1946, 77 y.o., female Today's Date: 10/10/2023  END OF SESSION:  PT End of Session - 10/10/23 1414     Visit Number 4    Number of Visits 17    Date for PT Re-Evaluation 11/10/23    Authorization Type MEDICARE PART A AND B    PT Start Time 1333    PT Stop Time 1422    PT Time Calculation (min) 49 min    Activity Tolerance Patient tolerated treatment well    Behavior During Therapy [facility] for tasks assessed/performed                Past Medical History:  Diagnosis Date   Anxiety    Arthritis    Blood transfusion without reported diagnosis 05/2019   with hip surgery   Complication of anesthesia    Fibromyalgia    Fractured pelvis (HCC) 09/15/2020   right   GERD (gastroesophageal reflux disease)    GI bleed 0160,1093   x2   Hyperlipidemia    Palpitations    Pneumonia 2005   PONV (postoperative nausea and vomiting)    Pre-diabetes    Stomach ulcer 1990   Past Surgical History:  Procedure Laterality Date   ABDOMINAL HYSTERECTOMY     FRACTURE SURGERY Right 1998   ankle   JOINT REPLACEMENT     LUMBAR FUSION  2015   OPEN REDUCTION INTERNAL FIXATION (ORIF) PROXIMAL PHALANX Right 09/21/2020   Procedure: OPEN TREATMENT OF RIGHT LONG FINGER PROXIMAL PHALANX FRACTURE;  Surgeon: Mack Hook, MD;  Location: [facility] OR;  Service: Orthopedics;  Laterality: Right;  LENGTH OF SURGERY: 75 MIN   ORIF HUMERUS FRACTURE Left    ORIF HUMERUS FRACTURE Left 05/15/2023   Procedure: OPEN REDUCTION INTERNAL FIXATION (ORIF) PERIPROSTHETIC HUMERUS FRACTURE WITH PLATE REMOVAL;  Surgeon: Jones Broom, MD;  Location: [facility] ORS;  Service: Orthopedics;  Laterality: Left;   ORIF TIBIA FRACTURE Right 1998   with bone graft   PYLOROPLASTY     REVERSE SHOULDER ARTHROPLASTY Left 02/04/2021   Procedure: REVERSE SHOULDER ARTHROPLASTY WITH HARDWARE REMOVAL;  Surgeon: Jones Broom, MD;  Location:  [facility] ORS;  Service: Orthopedics;  Laterality: Left;   TOTAL HIP ARTHROPLASTY  2011   TOTAL HIP ARTHROPLASTY Right 05/14/2019   Procedure: Right Anterior Hip Arthroplasty;  Surgeon: Marcene Corning, MD;  Location: [facility] ORS;  Service: Orthopedics;  Laterality: Right;   TOTAL SHOULDER REPLACEMENT  2014   Patient Active Problem List   Diagnosis Date Noted   S/P ORIF (open reduction internal fixation) fracture 05/15/2023   Palpitations 09/01/2020   Chest pain 09/01/2020   Abnormal ECG 09/01/2020   Moderate episode of recurrent major depressive disorder (HCC) 06/22/2020   Prediabetes 06/22/2020   Mixed hyperlipidemia 06/22/2020   History of total right hip arthroplasty 05/17/2019   Anxiety    Primary osteoarthritis of right hip 05/14/2019   Vitamin D deficiency 01/10/2019   Insomnia due to anxiety and fear 10/25/2017   Chronic prescription benzodiazepine use 10/25/2017   Chronic bilateral low back pain without sciatica 04/27/2017   Osteoarthritis 01/06/2017   GAD (generalized anxiety disorder) 01/06/2017   Rheumatoid arthritis involving multiple sites with positive rheumatoid factor (HCC) 12/12/2016   Status post lumbar spinal fusion 12/12/2016   Fibromyalgia 12/12/2016    PCP: Tracey Harries, MD   REFERRING PROVIDER: Jones Broom, MD  REFERRING DIAG: S/p ORIF L periprosthetic Proximal Humerus fracture  THERAPY DIAG:  Muscle weakness (  generalized)  Stiffness of left shoulder, not elsewhere classified  Other low back pain  Rationale for Evaluation and Treatment: Rehabilitation  ONSET DATE: 05/15/23 Surgery  SUBJECTIVE:                                                                                                                                                                                      SUBJECTIVE STATEMENT: Pt reports she traumatized her L shoulder when lifting the hood of her car and it accidentally landed on her L shoulder. She saw Dr. Ave Filter on 11/29. A Xray was  completed and she was told there was no damage to her TSR, but she did experience muscle contusions.   EVAL: Pt reports she is currently having significant limitations with the use of her L UE with above shoulder movements. Pt reports being in a sling for 9 weeks since her surgical repair. Her L shoulder replacement was originally completed in 2021. Pt would like to regain the functional use of her L UE for reaching above shoulder, hygiene, and dressing.  Hand dominance: Right  PERTINENT HISTORY: Hospital Course: Tanya Duncan is an 77 y.o. female who was admitted 05/15/2023 for operative treatment ofS/P ORIF (open reduction internal fixation) fracture. Patient has severe unremitting pain that affects sleep, daily activities, and work/hobbies. After pre-op clearance the patient was taken to the operating room on 05/15/2023 and underwent  Procedure(s): OPEN REDUCTION INTERNAL FIXATION (ORIF) PERIPROSTHETIC HUMERUS FRACTURE WITH PLATE REMOVAL.    PAIN:  Are you having pain? No With elevation 7/10 lateral GH area  PRECAUTIONS: Shoulder  RED FLAGS: None   WEIGHT BEARING RESTRICTIONS: No  FALLS:  Has patient fallen in last 6 months? Yes. Number of falls 1  LIVING ENVIRONMENT: Lives with: lives alone Lives in: House/apartment No issue accessing her home or with mobility within  OCCUPATION: retired  PLOF: Independent and Independent with basic ADLs  PATIENT GOALS: To regain  NEXT MD VISIT:   OBJECTIVE:  Note: Objective measures were completed at Evaluation unless otherwise noted.  DIAGNOSTIC FINDINGS:  CLINICAL DATA:  Surgery   EXAM: LEFT HUMERUS - 2+ VIEW   COMPARISON:  04/27/2023   FINDINGS: Five low resolution intraoperative spot views of the left shoulder. Total fluoroscopy time was 11 seconds, fluoroscopic dose of 0.89 mGy. Left shoulder replacement with additional plate and fixating screws for previously noted proximal humerus periprosthetic fracture    IMPRESSION: Intraoperative fluoroscopic assistance provided during left shoulder surgery.  PATIENT SURVEYS:  FOTO: Perceived function   53%, predicted   60%   POSTURE: Scoliosis c significant lat R shift, increased thoracic kyphosis and forward head  UPPER EXTREMITY ROM:   Active  ROM Right eval Left eval LT 09/28/23 LT 10/10/23  Shoulder flexion A145 A65, AA wall 125, P145 A65 P135  Shoulder extension      Shoulder abduction A145 A50, AA wall 90, P130    Shoulder adduction      Shoulder internal rotation A T7 A T7, 90    Shoulder external rotation A T3 A occiput, 45    Elbow flexion      Elbow extension      Wrist flexion      Wrist extension      Wrist ulnar deviation      Wrist radial deviation      Wrist pronation      Wrist supination      (Blank rows = not tested)  UPPER EXTREMITY MMT:  MMT Right eval Left eval  Shoulder flexion  2  Shoulder extension  3  Shoulder abduction  2  Shoulder adduction  3  Shoulder internal rotation  3  Shoulder external rotation  2  Middle trapezius    Lower trapezius    Elbow flexion    Elbow extension    Wrist flexion    Wrist extension    Wrist ulnar deviation    Wrist radial deviation    Wrist pronation    Wrist supination    Grip strength (lbs)    (Blank rows = not tested)  SHOULDER SPECIAL TESTS: deferred  JOINT MOBILITY TESTING:  N/a  PALPATION:  Not TTP   TODAY'S TREATMENT:   [facility] Adult PT Treatment:                                                DATE: 10/10/23 Therapeutic Exercise: Pulley 2 mins Counter slide x10 for shoulder flexion S/L shoulder ER 2x10 1# S/L shoulder Adb 2x10  Supine Chest press c wand x10 Supine shoulder abd c wand x10 Supine shoulder flexion x10 S/L shoulder ER 2x10 1# S/L shoulder Adb 2x10  Updated HEP Modalities: Cold pack to the L shoulder x 10 mins  [facility] Adult PT Treatment:                                                DATE: 09/28/23 Therapeutic Exercise: Pulley 2  mins Counter slide x10 for shoulder flexion Standing shoulder row 2x10 RTB Standing bilat shoulder ER 2x10 YTB Supine Shoulder pullovers c wand x10 Supine Chest press c wand x10 Supine shoulder abd c wand x10 Supine shoulder flexion x10 S/L shoulder ER x10 S/L shoulder Adb x10  [facility] Adult PT Treatment:                                                DATE: 09/26/23 Therapeutic Exercise: Pulley 2 mins Shoulder ladder x10 Standing shoulder row 2x10 GTB Standing bilat shoulder ER 2x10 YTB Supine Shoulder pullovers c wand x10 Supine Chest press c wand x10 Supine shoulder abd c wand x10 Supine shoulder flexion x10 S/L shoulder ER x10 S/L shoulder Adb x10    [facility] Adult PT Treatment:  DATE: 09/07/23 Therapeutic Exercise: Developed, instructed in, and pt completed therex as noted in HEP  Self Care: Use of heating pad or cold pack for increased pain                                                                                                                                     PATIENT EDUCATION: Education details: Discussed eval findings, rehab rationale and POC and patient is in agreement  Person educated: Patient Education method: Explanation Education comprehension: verbalized understanding and needs further education  HOME EXERCISE PROGRAM: Access Code: [email or web address] URL: [email or web address] Date: 10/10/2023 Prepared by: Joellyn Rued  Exercises - Supine Shoulder Flexion Extension AAROM with Dowel  - 1-2 x daily - 7 x weekly - 1-2 sets - 10 reps - 3 hold - Supine Shoulder Protraction with Dowel  - 1-2 x daily - 7 x weekly - 1-2 sets - 10 reps - 3 hold - Seated Shoulder Flexion Towel Slide at Table Top Full Range of Motion  - 1-2 x daily - 7 x weekly - 1-2 sets - 10 reps - 3 hold - Standing Single Arm Shoulder Flexion Stretch on Wall  - 1-2 x daily - 7 x weekly - 1-2 sets - 10 reps - 3 hold - Standing Shoulder Abduction  Slides at Wall  - 1-2 x daily - 7 x weekly - 1-2 sets - 10 reps - 3 hold - Shoulder External Rotation and Scapular Retraction with Resistance  - 1-2 x daily - 7 x weekly - 1-2 sets - 10 reps - 3 hold - Standing Shoulder Row with Anchored Resistance  - 1-2 x daily - 7 x weekly - 1-2 sets - 10 reps - 3 hold - Sidelying Shoulder Abduction Palm Forward  - 1 x daily - 7 x weekly - 2 sets - 10 reps - Sidelying Shoulder ER with Towel and Dumbbell  - 1 x daily - 7 x weekly - 2 sets - 10 reps  ASSESSMENT:  CLINICAL IMPRESSION: Pt's PROM of the L shoulder is close to it's original measurement after a recent trauma. Pt was completed today for AAROM as tolerated and for gentle strengthening. Pt tolerated PT today without adverse effects. A cold pack was applied to the L shoulder for symptom management at EOS. Pt will continue to benefit from skilled PT to address impairments for improved L shoulder function.    EVAL: Patient is a 77 y.o. female who was seen today for physical therapy evaluation and treatment for L shoulder loss of motion, decreased strength, and overall decreased function following a traumatic fall and subsequent ORIF of periprosthetic humerus sfx. A HEP was initiated for ROM and strengthening. Pt will benefit from skilled PT to address impairments to optimize L shoulder/UE.Marland Kitchen    OBJECTIVE IMPAIRMENTS: decreased activity tolerance, decreased endurance, decreased knowledge of condition, decreased mobility, decreased ROM, decreased strength, impaired perceived functional ability, impaired UE functional use,  improper body mechanics, postural dysfunction, and pain.   ACTIVITY LIMITATIONS: carrying, lifting, sleeping, and reach over head  PERSONAL FACTORS: Age, Fitness, Past/current experiences, and 1 comorbidity: fibromyalgia  are also affecting patient's functional outcome.   REHAB POTENTIAL: Good  CLINICAL DECISION MAKING: Evolving/moderate complexity  EVALUATION COMPLEXITY:  Moderate   GOALS: Goals reviewed with patient? No  SHORT TERM GOALS: Target date: 09/29/2023    Patient to demonstrate independence in HEP  Baseline: Goal status: MET  2.  Increased L shoulder AROM for flexion to 100d and abd to 80d for progression toward functional LTGs Baseline:  Goal status: NOT MET   LONG TERM GOALS: Target date: 11/10/2023    Patient will score at least 60% on FOTO to signify clinically meaningful improvement in functional abilities.   Baseline: 53% Goal status: INITIAL  2.  Pt will be Ind in a final HEP to maintain achieved LOF  Baseline: started Goal status: INITIAL  3.  Increased L shoulder strength to 3+ or greater in order for the pt if lift 2lbs above shoulder height for in home tasks Baseline: see flow sheets Goal status: INITIAL  4.  Increase L shoulder AROM to flexion 125d, abd 90d, and ER to T1 or greater for improved L UE ability for reaching above shoulder, dressing, and hygiene Baseline: see flow sheets Goal status: INITIAL   PLAN:  PT FREQUENCY: 2x/week  PT DURATION: 8 weeks  PLANNED INTERVENTIONS: 97164- PT Re-evaluation, 97110-Therapeutic exercises, 97530- Therapeutic activity, 97112- Neuromuscular re-education, 97535- Self Care, [id number]- Manual therapy, 97014- Electrical stimulation (unattended), Dry Needling, Joint mobilization, Cryotherapy, and Moist heat  PLAN FOR NEXT SESSION: Review FOTO; assess response to HEP; progress therex as indicated; use of modalities, manual therapy; and TPDN as indicated.    Ever Gustafson MS, PT 10/10/23 5:03 PM

## 2023-10-10 ENCOUNTER — Ambulatory Visit: Payer: Medicare Other | Attending: Orthopedic Surgery

## 2023-10-10 DIAGNOSIS — M5459 Other low back pain: Secondary | ICD-10-CM | POA: Diagnosis present

## 2023-10-10 DIAGNOSIS — M25612 Stiffness of left shoulder, not elsewhere classified: Secondary | ICD-10-CM | POA: Diagnosis present

## 2023-10-10 DIAGNOSIS — M6281 Muscle weakness (generalized): Secondary | ICD-10-CM | POA: Insufficient documentation

## 2023-10-11 NOTE — Therapy (Signed)
OUTPATIENT PHYSICAL THERAPY SHOULDER TREATMENT   Patient Name: Tanya Duncan MRN: [id number] DOB:Dec 20, 1945, 77 y.o., female Today's Date: 10/12/2023  END OF SESSION:  PT End of Session - 10/12/23 1339     Visit Number 5    Number of Visits 17    Date for PT Re-Evaluation 11/10/23    Authorization Type MEDICARE PART A AND B    PT Start Time 1332    PT Stop Time 1420    PT Time Calculation (min) 48 min    Activity Tolerance Patient tolerated treatment well    Behavior During Therapy [facility] for tasks assessed/performed                 Past Medical History:  Diagnosis Date   Anxiety    Arthritis    Blood transfusion without reported diagnosis 05/2019   with hip surgery   Complication of anesthesia    Fibromyalgia    Fractured pelvis (HCC) 09/15/2020   right   GERD (gastroesophageal reflux disease)    GI bleed 7253,6644   x2   Hyperlipidemia    Palpitations    Pneumonia 2005   PONV (postoperative nausea and vomiting)    Pre-diabetes    Stomach ulcer 1990   Past Surgical History:  Procedure Laterality Date   ABDOMINAL HYSTERECTOMY     FRACTURE SURGERY Right 1998   ankle   JOINT REPLACEMENT     LUMBAR FUSION  2015   OPEN REDUCTION INTERNAL FIXATION (ORIF) PROXIMAL PHALANX Right 09/21/2020   Procedure: OPEN TREATMENT OF RIGHT LONG FINGER PROXIMAL PHALANX FRACTURE;  Surgeon: Mack Hook, MD;  Location: [facility] OR;  Service: Orthopedics;  Laterality: Right;  LENGTH OF SURGERY: 75 MIN   ORIF HUMERUS FRACTURE Left    ORIF HUMERUS FRACTURE Left 05/15/2023   Procedure: OPEN REDUCTION INTERNAL FIXATION (ORIF) PERIPROSTHETIC HUMERUS FRACTURE WITH PLATE REMOVAL;  Surgeon: Jones Broom, MD;  Location: [facility] ORS;  Service: Orthopedics;  Laterality: Left;   ORIF TIBIA FRACTURE Right 1998   with bone graft   PYLOROPLASTY     REVERSE SHOULDER ARTHROPLASTY Left 02/04/2021   Procedure: REVERSE SHOULDER ARTHROPLASTY WITH HARDWARE REMOVAL;  Surgeon: Jones Broom, MD;  Location:  [facility] ORS;  Service: Orthopedics;  Laterality: Left;   TOTAL HIP ARTHROPLASTY  2011   TOTAL HIP ARTHROPLASTY Right 05/14/2019   Procedure: Right Anterior Hip Arthroplasty;  Surgeon: Marcene Corning, MD;  Location: [facility] ORS;  Service: Orthopedics;  Laterality: Right;   TOTAL SHOULDER REPLACEMENT  2014   Patient Active Problem List   Diagnosis Date Noted   S/P ORIF (open reduction internal fixation) fracture 05/15/2023   Palpitations 09/01/2020   Chest pain 09/01/2020   Abnormal ECG 09/01/2020   Moderate episode of recurrent major depressive disorder (HCC) 06/22/2020   Prediabetes 06/22/2020   Mixed hyperlipidemia 06/22/2020   History of total right hip arthroplasty 05/17/2019   Anxiety    Primary osteoarthritis of right hip 05/14/2019   Vitamin D deficiency 01/10/2019   Insomnia due to anxiety and fear 10/25/2017   Chronic prescription benzodiazepine use 10/25/2017   Chronic bilateral low back pain without sciatica 04/27/2017   Osteoarthritis 01/06/2017   GAD (generalized anxiety disorder) 01/06/2017   Rheumatoid arthritis involving multiple sites with positive rheumatoid factor (HCC) 12/12/2016   Status post lumbar spinal fusion 12/12/2016   Fibromyalgia 12/12/2016    PCP: Tracey Harries, MD   REFERRING PROVIDER: Jones Broom, MD  REFERRING DIAG: S/p ORIF L periprosthetic Proximal Humerus fracture  THERAPY DIAG:  Muscle  weakness (generalized)  Stiffness of left shoulder, not elsewhere classified  Rationale for Evaluation and Treatment: Rehabilitation  ONSET DATE: 05/15/23 Surgery  SUBJECTIVE:                                                                                                                                                                                      SUBJECTIVE STATEMENT: Pt reports her L shoulder was sore and she developed more bruising after the last PT session. She notes that pain has improved with her current pain level being low. She reports her PCP  may order a MRI if the pain and bruising persist.  EVAL: Pt reports she is currently having significant limitations with the use of her L UE with above shoulder movements. Pt reports being in a sling for 9 weeks since her surgical repair. Her L shoulder replacement was originally completed in 2021. Pt would like to regain the functional use of her L UE for reaching above shoulder, hygiene, and dressing.  Hand dominance: Right  PERTINENT HISTORY: Hospital Course: Tanya Duncan is an 77 y.o. female who was admitted 05/15/2023 for operative treatment ofS/P ORIF (open reduction internal fixation) fracture. Patient has severe unremitting pain that affects sleep, daily activities, and work/hobbies. After pre-op clearance the patient was taken to the operating room on 05/15/2023 and underwent  Procedure(s): OPEN REDUCTION INTERNAL FIXATION (ORIF) PERIPROSTHETIC HUMERUS FRACTURE WITH PLATE REMOVAL.    PAIN:  Are you having pain? No With elevation 7/10 lateral GH area  PRECAUTIONS: Shoulder  RED FLAGS: None   WEIGHT BEARING RESTRICTIONS: No  FALLS:  Has patient fallen in last 6 months? Yes. Number of falls 1  LIVING ENVIRONMENT: Lives with: lives alone Lives in: House/apartment No issue accessing her home or with mobility within  OCCUPATION: retired  PLOF: Independent and Independent with basic ADLs  PATIENT GOALS: To regain  NEXT MD VISIT:   OBJECTIVE:  Note: Objective measures were completed at Evaluation unless otherwise noted.  DIAGNOSTIC FINDINGS:  CLINICAL DATA:  Surgery   EXAM: LEFT HUMERUS - 2+ VIEW   COMPARISON:  04/27/2023   FINDINGS: Five low resolution intraoperative spot views of the left shoulder. Total fluoroscopy time was 11 seconds, fluoroscopic dose of 0.89 mGy. Left shoulder replacement with additional plate and fixating screws for previously noted proximal humerus periprosthetic fracture   IMPRESSION: Intraoperative fluoroscopic assistance provided  during left shoulder surgery.  PATIENT SURVEYS:  FOTO: Perceived function   53%, predicted   60%   POSTURE: Scoliosis c significant lat R shift, increased thoracic kyphosis and forward head  UPPER EXTREMITY ROM:   Active ROM Right eval Left eval LT 09/28/23 LT 10/10/23 LT  10/12/23  Shoulder flexion A145 A65, AA wall 125, P145 A65 P135   Shoulder extension       Shoulder abduction A145 A50, AA wall 90, P130     Shoulder adduction       Shoulder internal rotation A T7 A T7, 90     Shoulder external rotation A T3 A occiput, 45     Elbow flexion       Elbow extension       Wrist flexion       Wrist extension       Wrist ulnar deviation       Wrist radial deviation       Wrist pronation       Wrist supination       (Blank rows = not tested)  UPPER EXTREMITY MMT:  MMT Right eval Left eval  Shoulder flexion  2  Shoulder extension  3  Shoulder abduction  2  Shoulder adduction  3  Shoulder internal rotation  3  Shoulder external rotation  2  Middle trapezius    Lower trapezius    Elbow flexion    Elbow extension    Wrist flexion    Wrist extension    Wrist ulnar deviation    Wrist radial deviation    Wrist pronation    Wrist supination    Grip strength (lbs)    (Blank rows = not tested)  SHOULDER SPECIAL TESTS: deferred  JOINT MOBILITY TESTING:  N/a  PALPATION:  Not TTP   TODAY'S TREATMENT:   [facility] Adult PT Treatment:                                                DATE: 10/12/23 Therapeutic Exercise: Pulley 2 mins Counter slide 2x10 for shoulder flexion Supine Chest press c wand x4, stopped due to ant GH area pain Supine shoulder ER/IR c wand x4, stopped due to ant GH area pain S/L shoulder ER 2x10  S/L shoulder Adb 2x10  Manual Therapy: PROM for L shoulder flexion, abd, and ER  Modalities: Moist heat to the L shoulder x 10 mins  [facility] Adult PT Treatment:                                                DATE: 10/10/23 Therapeutic Exercise: Pulley 2  mins Counter slide x10 for shoulder flexion S/L shoulder ER 2x10 1# S/L shoulder Adb 2x10  Supine Chest press c wand x10 Supine shoulder abd c wand x10 Supine shoulder flexion x10 Updated HEP Modalities: Cold pack to the L shoulder x 10 mins  [facility] Adult PT Treatment:                                                DATE: 09/28/23 Therapeutic Exercise: Pulley 2 mins Counter slide x10 for shoulder flexion Standing shoulder row 2x10 RTB Standing bilat shoulder ER 2x10 YTB Supine Shoulder pullovers c wand x10 Supine Chest press c wand x10 Supine shoulder abd c wand x10 Supine shoulder flexion x10 S/L shoulder ER x10 S/L shoulder Adb x10  [facility] Adult PT  Treatment:                                                DATE: 09/26/23 Therapeutic Exercise: Pulley 2 mins Shoulder ladder x10 Standing shoulder row 2x10 GTB Standing bilat shoulder ER 2x10 YTB Supine Shoulder pullovers c wand x10 Supine Chest press c wand x10 Supine shoulder abd c wand x10 Supine shoulder flexion x10 S/L shoulder ER x10 S/L shoulder Adb x10    [facility] Adult PT Treatment:                                                DATE: 09/07/23 Therapeutic Exercise: Developed, instructed in, and pt completed therex as noted in HEP  Self Care: Use of heating pad or cold pack for increased pain                                                                                                                                     PATIENT EDUCATION: Education details: Discussed eval findings, rehab rationale and POC and patient is in agreement  Person educated: Patient Education method: Explanation Education comprehension: verbalized understanding and needs further education  HOME EXERCISE PROGRAM: Access Code: [email or web address] URL: [email or web address] Date: 10/10/2023 Prepared by: Joellyn Rued  Exercises - Supine Shoulder Flexion Extension AAROM with Dowel  - 1-2 x daily - 7 x weekly - 1-2 sets - 10 reps - 3 hold -  Supine Shoulder Protraction with Dowel  - 1-2 x daily - 7 x weekly - 1-2 sets - 10 reps - 3 hold - Seated Shoulder Flexion Towel Slide at Table Top Full Range of Motion  - 1-2 x daily - 7 x weekly - 1-2 sets - 10 reps - 3 hold - Standing Single Arm Shoulder Flexion Stretch on Wall  - 1-2 x daily - 7 x weekly - 1-2 sets - 10 reps - 3 hold - Standing Shoulder Abduction Slides at Wall  - 1-2 x daily - 7 x weekly - 1-2 sets - 10 reps - 3 hold - Shoulder External Rotation and Scapular Retraction with Resistance  - 1-2 x daily - 7 x weekly - 1-2 sets - 10 reps - 3 hold - Standing Shoulder Row with Anchored Resistance  - 1-2 x daily - 7 x weekly - 1-2 sets - 10 reps - 3 hold - Sidelying Shoulder Abduction Palm Forward  - 1 x daily - 7 x weekly - 2 sets - 10 reps - Sidelying Shoulder ER with Towel and Dumbbell  - 1 x daily - 7 x weekly - 2 sets - 10 reps  ASSESSMENT:  CLINICAL IMPRESSION: Pt experienced an increase  in L shoulder bruising and pain following the last PT session, although at the time of the session, she did not think she had overal done it. PROM for flexion was assessed again today, and it was found to be at a level prior to the trauma last week. Today, the physical demand was reduced per decreased reps and resistance level. Pt did experience intermittent L GH area pain with wand exs which were stopped for today. Will gradualy progress therex as tolerated as pt recovers from recent trauma to the L shoulder occurring 1 week ago.   EVAL: Patient is a 77 y.o. female who was seen today for physical therapy evaluation and treatment for L shoulder loss of motion, decreased strength, and overall decreased function following a traumatic fall and subsequent ORIF of periprosthetic humerus sfx. A HEP was initiated for ROM and strengthening. Pt will benefit from skilled PT to address impairments to optimize L shoulder/UE.Marland Kitchen    OBJECTIVE IMPAIRMENTS: decreased activity tolerance, decreased endurance,  decreased knowledge of condition, decreased mobility, decreased ROM, decreased strength, impaired perceived functional ability, impaired UE functional use, improper body mechanics, postural dysfunction, and pain.   ACTIVITY LIMITATIONS: carrying, lifting, sleeping, and reach over head  PERSONAL FACTORS: Age, Fitness, Past/current experiences, and 1 comorbidity: fibromyalgia  are also affecting patient's functional outcome.   REHAB POTENTIAL: Good  CLINICAL DECISION MAKING: Evolving/moderate complexity  EVALUATION COMPLEXITY: Moderate   GOALS: Goals reviewed with patient? No  SHORT TERM GOALS: Target date: 09/29/2023    Patient to demonstrate independence in HEP  Baseline: Goal status: MET  2.  Increased L shoulder AROM for flexion to 100d and abd to 80d for progression toward functional LTGs Baseline:  Goal status: NOT MET   LONG TERM GOALS: Target date: 11/10/2023    Patient will score at least 60% on FOTO to signify clinically meaningful improvement in functional abilities.   Baseline: 53% Goal status: INITIAL  2.  Pt will be Ind in a final HEP to maintain achieved LOF  Baseline: started Goal status: INITIAL  3.  Increased L shoulder strength to 3+ or greater in order for the pt if lift 2lbs above shoulder height for in home tasks Baseline: see flow sheets Goal status: INITIAL  4.  Increase L shoulder AROM to flexion 125d, abd 90d, and ER to T1 or greater for improved L UE ability for reaching above shoulder, dressing, and hygiene Baseline: see flow sheets Goal status: INITIAL   PLAN:  PT FREQUENCY: 2x/week  PT DURATION: 8 weeks  PLANNED INTERVENTIONS: 97164- PT Re-evaluation, 97110-Therapeutic exercises, 97530- Therapeutic activity, 97112- Neuromuscular re-education, 97535- Self Care, [id number]- Manual therapy, 97014- Electrical stimulation (unattended), Dry Needling, Joint mobilization, Cryotherapy, and Moist heat  PLAN FOR NEXT SESSION: Review FOTO; assess  response to HEP; progress therex as indicated; use of modalities, manual therapy; and TPDN as indicated.    Abaigeal Moomaw MS, PT 10/12/23 9:56 PM

## 2023-10-12 ENCOUNTER — Ambulatory Visit: Payer: Medicare Other

## 2023-10-12 DIAGNOSIS — M6281 Muscle weakness (generalized): Secondary | ICD-10-CM

## 2023-10-12 DIAGNOSIS — M25612 Stiffness of left shoulder, not elsewhere classified: Secondary | ICD-10-CM

## 2023-10-16 NOTE — Therapy (Incomplete)
OUTPATIENT PHYSICAL THERAPY SHOULDER TREATMENT   Patient Name: Tanya Duncan MRN: [id number] DOB:09-24-1946, 77 y.o., female Today's Date: 10/16/2023  END OF SESSION:        Past Medical History:  Diagnosis Date   Anxiety    Arthritis    Blood transfusion without reported diagnosis 05/2019   with hip surgery   Complication of anesthesia    Fibromyalgia    Fractured pelvis (HCC) 09/15/2020   right   GERD (gastroesophageal reflux disease)    GI bleed [id number]   x2   Hyperlipidemia    Palpitations    Pneumonia 2005   PONV (postoperative nausea and vomiting)    Pre-diabetes    Stomach ulcer 1990   Past Surgical History:  Procedure Laterality Date   ABDOMINAL HYSTERECTOMY     FRACTURE SURGERY Right 1998   ankle   JOINT REPLACEMENT     LUMBAR FUSION  2015   OPEN REDUCTION INTERNAL FIXATION (ORIF) PROXIMAL PHALANX Right 09/21/2020   Procedure: OPEN TREATMENT OF RIGHT LONG FINGER PROXIMAL PHALANX FRACTURE;  Surgeon: Mack Hook, MD;  Location: [facility];  Service: Orthopedics;  Laterality: Right;  LENGTH OF SURGERY: 75 MIN   ORIF HUMERUS FRACTURE Left    ORIF HUMERUS FRACTURE Left 05/15/2023   Procedure: OPEN REDUCTION INTERNAL FIXATION (ORIF) PERIPROSTHETIC HUMERUS FRACTURE WITH PLATE REMOVAL;  Surgeon: Jones Broom, MD;  Location: [facility];  Service: Orthopedics;  Laterality: Left;   ORIF TIBIA FRACTURE Right 1998   with bone graft   PYLOROPLASTY     REVERSE SHOULDER ARTHROPLASTY Left 02/04/2021   Procedure: REVERSE SHOULDER ARTHROPLASTY WITH HARDWARE REMOVAL;  Surgeon: Jones Broom, MD;  Location: [facility];  Service: Orthopedics;  Laterality: Left;   TOTAL HIP ARTHROPLASTY  2011   TOTAL HIP ARTHROPLASTY Right 05/14/2019   Procedure: Right Anterior Hip Arthroplasty;  Surgeon: Marcene Corning, MD;  Location: [facility];  Service: Orthopedics;  Laterality: Right;   TOTAL SHOULDER REPLACEMENT  2014   Patient Active Problem List   Diagnosis Date Noted   S/P ORIF (open  reduction internal fixation) fracture 05/15/2023   Palpitations 09/01/2020   Chest pain 09/01/2020   Abnormal ECG 09/01/2020   Moderate episode of recurrent major depressive disorder (HCC) 06/22/2020   Prediabetes 06/22/2020   Mixed hyperlipidemia 06/22/2020   History of total right hip arthroplasty 05/17/2019   Anxiety    Primary osteoarthritis of right hip 05/14/2019   Vitamin D deficiency 01/10/2019   Insomnia due to anxiety and fear 10/25/2017   Chronic prescription benzodiazepine use 10/25/2017   Chronic bilateral low back pain without sciatica 04/27/2017   Osteoarthritis 01/06/2017   GAD (generalized anxiety disorder) 01/06/2017   Rheumatoid arthritis involving multiple sites with positive rheumatoid factor (HCC) 12/12/2016   Status post lumbar spinal fusion 12/12/2016   Fibromyalgia 12/12/2016    PCP: Tracey Harries, MD   REFERRING PROVIDER: Jones Broom, MD  REFERRING DIAG: S/p ORIF L periprosthetic Proximal Humerus fracture  THERAPY DIAG:  No diagnosis found.  Rationale for Evaluation and Treatment: Rehabilitation  ONSET DATE: 05/15/23 Surgery  SUBJECTIVE:  SUBJECTIVE STATEMENT: Pt reports her L shoulder was sore and she developed more bruising after the last PT session. She notes that pain has improved with her current pain level being low. She reports her PCP may order a MRI if the pain and bruising persist.  EVAL: Pt reports she is currently having significant limitations with the use of her L UE with above shoulder movements. Pt reports being in a sling for 9 weeks since her surgical repair. Her L shoulder replacement was originally completed in 2021. Pt would like to regain the functional use of her L UE for reaching above shoulder, hygiene, and dressing.  Hand dominance:  Right  PERTINENT HISTORY: Hospital Course: Tanya Duncan is an 77 y.o. female who was admitted 05/15/2023 for operative treatment ofS/P ORIF (open reduction internal fixation) fracture. Patient has severe unremitting pain that affects sleep, daily activities, and work/hobbies. After pre-op clearance the patient was taken to the operating room on 05/15/2023 and underwent  Procedure(s): OPEN REDUCTION INTERNAL FIXATION (ORIF) PERIPROSTHETIC HUMERUS FRACTURE WITH PLATE REMOVAL.    PAIN:  Are you having pain? No With elevation 7/10 lateral GH area  PRECAUTIONS: Shoulder  RED FLAGS: None   WEIGHT BEARING RESTRICTIONS: No  FALLS:  Has patient fallen in last 6 months? Yes. Number of falls 1  LIVING ENVIRONMENT: Lives with: lives alone Lives in: House/apartment No issue accessing her home or with mobility within  OCCUPATION: retired  PLOF: Independent and Independent with basic ADLs  PATIENT GOALS: To regain  NEXT MD VISIT:   OBJECTIVE:  Note: Objective measures were completed at Evaluation unless otherwise noted.  DIAGNOSTIC FINDINGS:  CLINICAL DATA:  Surgery   EXAM: LEFT HUMERUS - 2+ VIEW   COMPARISON:  04/27/2023   FINDINGS: Five low resolution intraoperative spot views of the left shoulder. Total fluoroscopy time was 11 seconds, fluoroscopic dose of 0.89 mGy. Left shoulder replacement with additional plate and fixating screws for previously noted proximal humerus periprosthetic fracture   IMPRESSION: Intraoperative fluoroscopic assistance provided during left shoulder surgery.  PATIENT SURVEYS:  FOTO: Perceived function   53%, predicted   60%   POSTURE: Scoliosis c significant lat R shift, increased thoracic kyphosis and forward head  UPPER EXTREMITY ROM:   Active ROM Right eval Left eval LT 09/28/23 LT 10/10/23 LT 10/12/23  Shoulder flexion A145 A65, AA wall 125, P145 A65 P135   Shoulder extension       Shoulder abduction A145 A50, AA wall 90, P130      Shoulder adduction       Shoulder internal rotation A T7 A T7, 90     Shoulder external rotation A T3 A occiput, 45     Elbow flexion       Elbow extension       Wrist flexion       Wrist extension       Wrist ulnar deviation       Wrist radial deviation       Wrist pronation       Wrist supination       (Blank rows = not tested)  UPPER EXTREMITY MMT:  MMT Right eval Left eval  Shoulder flexion  2  Shoulder extension  3  Shoulder abduction  2  Shoulder adduction  3  Shoulder internal rotation  3  Shoulder external rotation  2  Middle trapezius    Lower trapezius    Elbow flexion    Elbow extension    Wrist flexion  Wrist extension    Wrist ulnar deviation    Wrist radial deviation    Wrist pronation    Wrist supination    Grip strength (lbs)    (Blank rows = not tested)  SHOULDER SPECIAL TESTS: deferred  JOINT MOBILITY TESTING:  N/a  PALPATION:  Not TTP   TODAY'S TREATMENT:   [facility] Adult PT Treatment:                                                DATE: 10/17/23 Therapeutic Exercise: *** Manual Therapy: *** Neuromuscular re-ed: *** Therapeutic Activity: *** Modalities: *** Self Care: ***  [facility] PT Treatment:                                                DATE: 10/12/23 Therapeutic Exercise: Pulley 2 mins Counter slide 2x10 for shoulder flexion Supine Chest press c wand x4, stopped due to ant GH area pain Supine shoulder ER/IR c wand x4, stopped due to ant GH area pain S/L shoulder ER 2x10  S/L shoulder Adb 2x10  Manual Therapy: PROM for L shoulder flexion, abd, and ER  Modalities: Moist heat to the L shoulder x 10 mins  [facility] Adult PT Treatment:                                                DATE: 10/10/23 Therapeutic Exercise: Pulley 2 mins Counter slide x10 for shoulder flexion S/L shoulder ER 2x10 1# S/L shoulder Adb 2x10  Supine Chest press c wand x10 Supine shoulder abd c wand x10 Supine shoulder flexion x10 Updated  HEP Modalities: Cold pack to the L shoulder x 10 mins  [facility] Adult PT Treatment:                                                DATE: 09/28/23 Therapeutic Exercise: Pulley 2 mins Counter slide x10 for shoulder flexion Standing shoulder row 2x10 RTB Standing bilat shoulder ER 2x10 YTB Supine Shoulder pullovers c wand x10 Supine Chest press c wand x10 Supine shoulder abd c wand x10 Supine shoulder flexion x10 S/L shoulder ER x10 S/L shoulder Adb x10  [facility] Adult PT Treatment:                                                DATE: 09/26/23 Therapeutic Exercise: Pulley 2 mins Shoulder ladder x10 Standing shoulder row 2x10 GTB Standing bilat shoulder ER 2x10 YTB Supine Shoulder pullovers c wand x10 Supine Chest press c wand x10 Supine shoulder abd c wand x10 Supine shoulder flexion x10 S/L shoulder ER x10 S/L shoulder Adb x10    [facility] Adult PT Treatment:  DATE: 09/07/23 Therapeutic Exercise: Developed, instructed in, and pt completed therex as noted in HEP  Self Care: Use of heating pad or cold pack for increased pain                                                                                                                                     PATIENT EDUCATION: Education details: Discussed eval findings, rehab rationale and POC and patient is in agreement  Person educated: Patient Education method: Explanation Education comprehension: verbalized understanding and needs further education  HOME EXERCISE PROGRAM: Access Code: [email or web address] URL: [email or web address] Date: 10/10/2023 Prepared by: Joellyn Rued  Exercises - Supine Shoulder Flexion Extension AAROM with Dowel  - 1-2 x daily - 7 x weekly - 1-2 sets - 10 reps - 3 hold - Supine Shoulder Protraction with Dowel  - 1-2 x daily - 7 x weekly - 1-2 sets - 10 reps - 3 hold - Seated Shoulder Flexion Towel Slide at Table Top Full Range of Motion  - 1-2 x daily - 7 x  weekly - 1-2 sets - 10 reps - 3 hold - Standing Single Arm Shoulder Flexion Stretch on Wall  - 1-2 x daily - 7 x weekly - 1-2 sets - 10 reps - 3 hold - Standing Shoulder Abduction Slides at Wall  - 1-2 x daily - 7 x weekly - 1-2 sets - 10 reps - 3 hold - Shoulder External Rotation and Scapular Retraction with Resistance  - 1-2 x daily - 7 x weekly - 1-2 sets - 10 reps - 3 hold - Standing Shoulder Row with Anchored Resistance  - 1-2 x daily - 7 x weekly - 1-2 sets - 10 reps - 3 hold - Sidelying Shoulder Abduction Palm Forward  - 1 x daily - 7 x weekly - 2 sets - 10 reps - Sidelying Shoulder ER with Towel and Dumbbell  - 1 x daily - 7 x weekly - 2 sets - 10 reps  ASSESSMENT:  CLINICAL IMPRESSION: Pt experienced an increase in L shoulder bruising and pain following the last PT session, although at the time of the session, she did not think she had overal done it. PROM for flexion was assessed again today, and it was found to be at a level prior to the trauma last week. Today, the physical demand was reduced per decreased reps and resistance level. Pt did experience intermittent L GH area pain with wand exs which were stopped for today. Will gradualy progress therex as tolerated as pt recovers from recent trauma to the L shoulder occurring 1 week ago.   EVAL: Patient is a 77 y.o. female who was seen today for physical therapy evaluation and treatment for L shoulder loss of motion, decreased strength, and overall decreased function following a traumatic fall and subsequent ORIF of periprosthetic humerus sfx. A HEP was initiated for ROM and strengthening. Pt will benefit from skilled PT  to address impairments to optimize L shoulder/UE.Marland Kitchen    OBJECTIVE IMPAIRMENTS: decreased activity tolerance, decreased endurance, decreased knowledge of condition, decreased mobility, decreased ROM, decreased strength, impaired perceived functional ability, impaired UE functional use, improper body mechanics, postural  dysfunction, and pain.   ACTIVITY LIMITATIONS: carrying, lifting, sleeping, and reach over head  PERSONAL FACTORS: Age, Fitness, Past/current experiences, and 1 comorbidity: fibromyalgia  are also affecting patient's functional outcome.   REHAB POTENTIAL: Good  CLINICAL DECISION MAKING: Evolving/moderate complexity  EVALUATION COMPLEXITY: Moderate   GOALS: Goals reviewed with patient? No  SHORT TERM GOALS: Target date: 09/29/2023    Patient to demonstrate independence in HEP  Baseline: Goal status: MET  2.  Increased L shoulder AROM for flexion to 100d and abd to 80d for progression toward functional LTGs Baseline:  Goal status: NOT MET   LONG TERM GOALS: Target date: 11/10/2023    Patient will score at least 60% on FOTO to signify clinically meaningful improvement in functional abilities.   Baseline: 53% Goal status: INITIAL  2.  Pt will be Ind in a final HEP to maintain achieved LOF  Baseline: started Goal status: INITIAL  3.  Increased L shoulder strength to 3+ or greater in order for the pt if lift 2lbs above shoulder height for in home tasks Baseline: see flow sheets Goal status: INITIAL  4.  Increase L shoulder AROM to flexion 125d, abd 90d, and ER to T1 or greater for improved L UE ability for reaching above shoulder, dressing, and hygiene Baseline: see flow sheets Goal status: INITIAL   PLAN:  PT FREQUENCY: 2x/week  PT DURATION: 8 weeks  PLANNED INTERVENTIONS: 97164- PT Re-evaluation, 97110-Therapeutic exercises, 97530- Therapeutic activity, 97112- Neuromuscular re-education, 97535- Self Care, 16109- Manual therapy, 97014- Electrical stimulation (unattended), Dry Needling, Joint mobilization, Cryotherapy, and Moist heat  PLAN FOR NEXT SESSION: Review FOTO; assess response to HEP; progress therex as indicated; use of modalities, manual therapy; and TPDN as indicated.    Emanii Bugbee MS, PT 10/16/23 8:57 PM

## 2023-10-17 ENCOUNTER — Ambulatory Visit: Payer: Medicare Other

## 2023-10-18 NOTE — Therapy (Signed)
OUTPATIENT PHYSICAL THERAPY SHOULDER TREATMENT   Patient Name: Tanya Duncan MRN: [id number] DOB:[birth year], 77 y.o., female Today's Date: 10/19/2023  END OF SESSION:  PT End of Session - 10/19/23 1415     Visit Number 6    Number of Visits 17    Date for PT Re-Evaluation 11/10/23    Authorization Type MEDICARE PART A AND B    PT Start Time 1333    PT Stop Time 1414    PT Time Calculation (min) 41 min    Activity Tolerance Patient tolerated treatment well    Behavior During Therapy [facility] for tasks assessed/performed                  Past Medical History:  Diagnosis Date   Anxiety    Arthritis    Blood transfusion without reported diagnosis 05/2019   with hip surgery   Complication of anesthesia    Fibromyalgia    Fractured pelvis (HCC) 09/15/2020   right   GERD (gastroesophageal reflux disease)    GI bleed 1191,4782   x2   Hyperlipidemia    Palpitations    Pneumonia 2005   PONV (postoperative nausea and vomiting)    Pre-diabetes    Stomach ulcer 1990   Past Surgical History:  Procedure Laterality Date   ABDOMINAL HYSTERECTOMY     FRACTURE SURGERY Right 1998   ankle   JOINT REPLACEMENT     LUMBAR FUSION  2015   OPEN REDUCTION INTERNAL FIXATION (ORIF) PROXIMAL PHALANX Right 09/21/2020   Procedure: OPEN TREATMENT OF RIGHT LONG FINGER PROXIMAL PHALANX FRACTURE;  Surgeon: Mack Hook, MD;  Location: [facility] OR;  Service: Orthopedics;  Laterality: Right;  LENGTH OF SURGERY: 75 MIN   ORIF HUMERUS FRACTURE Left    ORIF HUMERUS FRACTURE Left 05/15/2023   Procedure: OPEN REDUCTION INTERNAL FIXATION (ORIF) PERIPROSTHETIC HUMERUS FRACTURE WITH PLATE REMOVAL;  Surgeon: Jones Broom, MD;  Location: [facility] ORS;  Service: Orthopedics;  Laterality: Left;   ORIF TIBIA FRACTURE Right 1998   with bone graft   PYLOROPLASTY     REVERSE SHOULDER ARTHROPLASTY Left 02/04/2021   Procedure: REVERSE SHOULDER ARTHROPLASTY WITH HARDWARE REMOVAL;  Surgeon: Jones Broom, MD;   Location: [facility] ORS;  Service: Orthopedics;  Laterality: Left;   TOTAL HIP ARTHROPLASTY  2011   TOTAL HIP ARTHROPLASTY Right 05/14/2019   Procedure: Right Anterior Hip Arthroplasty;  Surgeon: Marcene Corning, MD;  Location: [facility] ORS;  Service: Orthopedics;  Laterality: Right;   TOTAL SHOULDER REPLACEMENT  2014   Patient Active Problem List   Diagnosis Date Noted   S/P ORIF (open reduction internal fixation) fracture 05/15/2023   Palpitations 09/01/2020   Chest pain 09/01/2020   Abnormal ECG 09/01/2020   Moderate episode of recurrent major depressive disorder (HCC) 06/22/2020   Prediabetes 06/22/2020   Mixed hyperlipidemia 06/22/2020   History of total right hip arthroplasty 05/17/2019   Anxiety    Primary osteoarthritis of right hip 05/14/2019   Vitamin D deficiency 01/10/2019   Insomnia due to anxiety and fear 10/25/2017   Chronic prescription benzodiazepine use 10/25/2017   Chronic bilateral low back pain without sciatica 04/27/2017   Osteoarthritis 01/06/2017   GAD (generalized anxiety disorder) 01/06/2017   Rheumatoid arthritis involving multiple sites with positive rheumatoid factor (HCC) 12/12/2016   Status post lumbar spinal fusion 12/12/2016   Fibromyalgia 12/12/2016    PCP: Tracey Harries, MD   REFERRING PROVIDER: Jones Broom, MD  REFERRING DIAG: S/p ORIF L periprosthetic Proximal Humerus fracture  THERAPY DIAG:  Muscle weakness (generalized)  Stiffness of left shoulder, not elsewhere classified  Rationale for Evaluation and Treatment: Rehabilitation  ONSET DATE: 05/15/23 Surgery  SUBJECTIVE:                                                                                                                                                                                      SUBJECTIVE STATEMENT: Pt reports the bruising she had last week is much better.  EVAL: Pt reports she is currently having significant limitations with the use of her L UE with above shoulder  movements. Pt reports being in a sling for 9 weeks since her surgical repair. Her L shoulder replacement was originally completed in 2021. Pt would like to regain the functional use of her L UE for reaching above shoulder, hygiene, and dressing.  Hand dominance: Right  PERTINENT HISTORY: Hospital Course: Tanya Duncan is an 77 y.o. female who was admitted 05/15/2023 for operative treatment ofS/P ORIF (open reduction internal fixation) fracture. Patient has severe unremitting pain that affects sleep, daily activities, and work/hobbies. After pre-op clearance the patient was taken to the operating room on 05/15/2023 and underwent  Procedure(s): OPEN REDUCTION INTERNAL FIXATION (ORIF) PERIPROSTHETIC HUMERUS FRACTURE WITH PLATE REMOVAL.    PAIN:  Are you having pain? No With active elevation 0/10 lateral GH area  PRECAUTIONS: Shoulder  RED FLAGS: None   WEIGHT BEARING RESTRICTIONS: No  FALLS:  Has patient fallen in last 6 months? Yes. Number of falls 1  LIVING ENVIRONMENT: Lives with: lives alone Lives in: House/apartment No issue accessing her home or with mobility within  OCCUPATION: retired  PLOF: Independent and Independent with basic ADLs  PATIENT GOALS: To regain  NEXT MD VISIT:   OBJECTIVE:  Note: Objective measures were completed at Evaluation unless otherwise noted.  DIAGNOSTIC FINDINGS:  CLINICAL DATA:  Surgery   EXAM: LEFT HUMERUS - 2+ VIEW   COMPARISON:  04/27/2023   FINDINGS: Five low resolution intraoperative spot views of the left shoulder. Total fluoroscopy time was 11 seconds, fluoroscopic dose of 0.89 mGy. Left shoulder replacement with additional plate and fixating screws for previously noted proximal humerus periprosthetic fracture   IMPRESSION: Intraoperative fluoroscopic assistance provided during left shoulder surgery.  PATIENT SURVEYS:  FOTO: Perceived function   53%, predicted   60%   POSTURE: Scoliosis c significant lat R shift, increased  thoracic kyphosis and forward head  UPPER EXTREMITY ROM:   Active ROM Right eval Left eval LT 09/28/23 LT 10/10/23 LT 10/12/23  Shoulder flexion A145 A65, AA wall 125, P145 A65 P135 A85 c shrug  Shoulder extension       Shoulder abduction A145 A50, AA wall 90,  P130     Shoulder adduction       Shoulder internal rotation A T7 A T7, 90     Shoulder external rotation A T3 A occiput, 45     Elbow flexion       Elbow extension       Wrist flexion       Wrist extension       Wrist ulnar deviation       Wrist radial deviation       Wrist pronation       Wrist supination       (Blank rows = not tested)  UPPER EXTREMITY MMT:  MMT Right eval Left eval  Shoulder flexion  2  Shoulder extension  3  Shoulder abduction  2  Shoulder adduction  3  Shoulder internal rotation  3  Shoulder external rotation  2  Middle trapezius    Lower trapezius    Elbow flexion    Elbow extension    Wrist flexion    Wrist extension    Wrist ulnar deviation    Wrist radial deviation    Wrist pronation    Wrist supination    Grip strength (lbs)    (Blank rows = not tested)  SHOULDER SPECIAL TESTS: deferred  JOINT MOBILITY TESTING:  N/a  PALPATION:  Not TTP   TODAY'S TREATMENT:   [facility] Adult PT Treatment:                                                DATE: 10/19/23 Therapeutic Exercise: Pulley 2 mins for both flexion and scaption Counter slide 2x10 for shoulder flexion Supine shoulder flexion c wand x10 Supine Chest press c wand x10 Supine shoulder ER/IR c wand x10 S/L shoulder ER 2x10 1# S/L shoulder Adb 2x10 1# Supine shoulder flexion 2x10; 45d x4 Shoulder row 2x10 YTB Shoulder ext 2x10 YTB  [facility] Adult PT Treatment:                                                DATE: 10/12/23 Therapeutic Exercise: Pulley 2 mins Counter slide 2x10 for shoulder flexion Supine Chest press c wand x4, stopped due to ant GH area pain Supine shoulder ER/IR c wand x4, stopped due to ant GH area  pain S/L shoulder ER 2x10  S/L shoulder Adb 2x10  Manual Therapy: PROM for L shoulder flexion, abd, and ER  Modalities: Moist heat to the L shoulder x 10 mins  [facility] Adult PT Treatment:                                                DATE: 10/10/23 Therapeutic Exercise: Pulley 2 mins Counter slide x10 for shoulder flexion S/L shoulder ER 2x10 1# S/L shoulder Adb 2x10  Supine Chest press c wand x10 Supine shoulder abd c wand x10 Supine shoulder flexion x10 Updated HEP Modalities: Cold pack to the L shoulder x 10 mins  [facility] Adult PT Treatment:  DATE: 09/28/23 Therapeutic Exercise: Pulley 2 mins Counter slide x10 for shoulder flexion Standing shoulder row 2x10 RTB Standing bilat shoulder ER 2x10 YTB Supine Shoulder pullovers c wand x10 Supine Chest press c wand x10 Supine shoulder abd c wand x10 Supine shoulder flexion x10 S/L shoulder ER x10 S/L shoulder Adb x10                                                                                                                                     PATIENT EDUCATION: Education details: Discussed eval findings, rehab rationale and POC and patient is in agreement  Person educated: Patient Education method: Explanation Education comprehension: verbalized understanding and needs further education  HOME EXERCISE PROGRAM: Access Code: [email or web address] URL: [email or web address] Date: 10/10/2023 Prepared by: Joellyn Rued  Exercises - Supine Shoulder Flexion Extension AAROM with Dowel  - 1-2 x daily - 7 x weekly - 1-2 sets - 10 reps - 3 hold - Supine Shoulder Protraction with Dowel  - 1-2 x daily - 7 x weekly - 1-2 sets - 10 reps - 3 hold - Seated Shoulder Flexion Towel Slide at Table Top Full Range of Motion  - 1-2 x daily - 7 x weekly - 1-2 sets - 10 reps - 3 hold - Standing Single Arm Shoulder Flexion Stretch on Wall  - 1-2 x daily - 7 x weekly - 1-2 sets - 10 reps - 3 hold -  Standing Shoulder Abduction Slides at Wall  - 1-2 x daily - 7 x weekly - 1-2 sets - 10 reps - 3 hold - Shoulder External Rotation and Scapular Retraction with Resistance  - 1-2 x daily - 7 x weekly - 1-2 sets - 10 reps - 3 hold - Standing Shoulder Row with Anchored Resistance  - 1-2 x daily - 7 x weekly - 1-2 sets - 10 reps - 3 hold - Sidelying Shoulder Abduction Palm Forward  - 1 x daily - 7 x weekly - 2 sets - 10 reps - Sidelying Shoulder ER with Towel and Dumbbell  - 1 x daily - 7 x weekly - 2 sets - 10 reps  ASSESSMENT:  CLINICAL IMPRESSION: Pt's R shoulder is feeling much better following the trauma 2 weeks ago. Pain provoked by movement is much improved. Pt's AROM and tolerance to resisted therex are improved as well. Active l shoulder flexion has improved to 85d c shrug present. Pt has made good progress since last week. Pt tolerated PT today without adverse effects. Pt will continue to benefit from skilled PT to address impairments for improved L shoulder function.    EVAL: Patient is a 77 y.o. female who was seen today for physical therapy evaluation and treatment for L shoulder loss of motion, decreased strength, and overall decreased function following a traumatic fall and subsequent ORIF of periprosthetic humerus sfx. A HEP was initiated for ROM and strengthening. Pt will benefit from skilled PT to  address impairments to optimize L shoulder/UE.Marland Kitchen    OBJECTIVE IMPAIRMENTS: decreased activity tolerance, decreased endurance, decreased knowledge of condition, decreased mobility, decreased ROM, decreased strength, impaired perceived functional ability, impaired UE functional use, improper body mechanics, postural dysfunction, and pain.   ACTIVITY LIMITATIONS: carrying, lifting, sleeping, and reach over head  PERSONAL FACTORS: Age, Fitness, Past/current experiences, and 1 comorbidity: fibromyalgia  are also affecting patient's functional outcome.   REHAB POTENTIAL: Good  CLINICAL DECISION  MAKING: Evolving/moderate complexity  EVALUATION COMPLEXITY: Moderate   GOALS: Goals reviewed with patient? No  SHORT TERM GOALS: Target date: 09/29/2023    Patient to demonstrate independence in HEP  Baseline: Goal status: MET  2.  Increased L shoulder AROM for flexion to 100d and abd to 80d for progression toward functional LTGs Baseline:  Goal status: NOT MET   LONG TERM GOALS: Target date: 11/10/2023    Patient will score at least 60% on FOTO to signify clinically meaningful improvement in functional abilities.   Baseline: 53% Goal status: INITIAL  2.  Pt will be Ind in a final HEP to maintain achieved LOF  Baseline: started Goal status: INITIAL  3.  Increased L shoulder strength to 3+ or greater in order for the pt if lift 2lbs above shoulder height for in home tasks Baseline: see flow sheets Goal status: INITIAL  4.  Increase L shoulder AROM to flexion 125d, abd 90d, and ER to T1 or greater for improved L UE ability for reaching above shoulder, dressing, and hygiene Baseline: see flow sheets 10/19/23: flex=85d Goal status: Ongoing   PLAN:  PT FREQUENCY: 2x/week  PT DURATION: 8 weeks  PLANNED INTERVENTIONS: 97164- PT Re-evaluation, 97110-Therapeutic exercises, 97530- Therapeutic activity, 97112- Neuromuscular re-education, 97535- Self Care, [id number]- Manual therapy, 97014- Electrical stimulation (unattended), Dry Needling, Joint mobilization, Cryotherapy, and Moist heat  PLAN FOR NEXT SESSION: Review FOTO; assess response to HEP; progress therex as indicated; use of modalities, manual therapy; and TPDN as indicated.    Tor Tsuda MS, PT 10/19/23 2:17 PM

## 2023-10-19 ENCOUNTER — Ambulatory Visit: Payer: Medicare Other

## 2023-10-19 DIAGNOSIS — M25612 Stiffness of left shoulder, not elsewhere classified: Secondary | ICD-10-CM

## 2023-10-19 DIAGNOSIS — M6281 Muscle weakness (generalized): Secondary | ICD-10-CM

## 2023-10-23 NOTE — Therapy (Signed)
OUTPATIENT PHYSICAL THERAPY SHOULDER TREATMENT   Patient Name: Tanya Duncan MRN: [id number] DOB:November 23, 1945, 77 y.o., female Today's Date: 10/24/2023  END OF SESSION:  PT End of Session - 10/24/23 1337     Visit Number 7    Number of Visits 17    Date for PT Re-Evaluation 11/10/23    Authorization Type MEDICARE PART A AND B    PT Start Time 1333    PT Stop Time 1413    PT Time Calculation (min) 40 min    Activity Tolerance Patient tolerated treatment well    Behavior During Therapy [facility] for tasks assessed/performed                   Past Medical History:  Diagnosis Date   Anxiety    Arthritis    Blood transfusion without reported diagnosis 05/2019   with hip surgery   Complication of anesthesia    Fibromyalgia    Fractured pelvis (HCC) 09/15/2020   right   GERD (gastroesophageal reflux disease)    GI bleed 4098,1191   x2   Hyperlipidemia    Palpitations    Pneumonia 2005   PONV (postoperative nausea and vomiting)    Pre-diabetes    Stomach ulcer 1990   Past Surgical History:  Procedure Laterality Date   ABDOMINAL HYSTERECTOMY     FRACTURE SURGERY Right 1998   ankle   JOINT REPLACEMENT     LUMBAR FUSION  2015   OPEN REDUCTION INTERNAL FIXATION (ORIF) PROXIMAL PHALANX Right 09/21/2020   Procedure: OPEN TREATMENT OF RIGHT LONG FINGER PROXIMAL PHALANX FRACTURE;  Surgeon: Mack Hook, MD;  Location: [facility] OR;  Service: Orthopedics;  Laterality: Right;  LENGTH OF SURGERY: 75 MIN   ORIF HUMERUS FRACTURE Left    ORIF HUMERUS FRACTURE Left 05/15/2023   Procedure: OPEN REDUCTION INTERNAL FIXATION (ORIF) PERIPROSTHETIC HUMERUS FRACTURE WITH PLATE REMOVAL;  Surgeon: Jones Broom, MD;  Location: [facility] ORS;  Service: Orthopedics;  Laterality: Left;   ORIF TIBIA FRACTURE Right 1998   with bone graft   PYLOROPLASTY     REVERSE SHOULDER ARTHROPLASTY Left 02/04/2021   Procedure: REVERSE SHOULDER ARTHROPLASTY WITH HARDWARE REMOVAL;  Surgeon: Jones Broom, MD;   Location: [facility] ORS;  Service: Orthopedics;  Laterality: Left;   TOTAL HIP ARTHROPLASTY  2011   TOTAL HIP ARTHROPLASTY Right 05/14/2019   Procedure: Right Anterior Hip Arthroplasty;  Surgeon: Marcene Corning, MD;  Location: [facility] ORS;  Service: Orthopedics;  Laterality: Right;   TOTAL SHOULDER REPLACEMENT  2014   Patient Active Problem List   Diagnosis Date Noted   S/P ORIF (open reduction internal fixation) fracture 05/15/2023   Palpitations 09/01/2020   Chest pain 09/01/2020   Abnormal ECG 09/01/2020   Moderate episode of recurrent major depressive disorder (HCC) 06/22/2020   Prediabetes 06/22/2020   Mixed hyperlipidemia 06/22/2020   History of total right hip arthroplasty 05/17/2019   Anxiety    Primary osteoarthritis of right hip 05/14/2019   Vitamin D deficiency 01/10/2019   Insomnia due to anxiety and fear 10/25/2017   Chronic prescription benzodiazepine use 10/25/2017   Chronic bilateral low back pain without sciatica 04/27/2017   Osteoarthritis 01/06/2017   GAD (generalized anxiety disorder) 01/06/2017   Rheumatoid arthritis involving multiple sites with positive rheumatoid factor (HCC) 12/12/2016   Status post lumbar spinal fusion 12/12/2016   Fibromyalgia 12/12/2016    PCP: Tracey Harries, MD   REFERRING PROVIDER: Jones Broom, MD  REFERRING DIAG: S/p ORIF L periprosthetic Proximal Humerus fracture  THERAPY DIAG:  Stiffness of left shoulder, not elsewhere classified  Muscle weakness (generalized)  Rationale for Evaluation and Treatment: Rehabilitation  ONSET DATE: 05/15/23 Surgery  SUBJECTIVE:                                                                                                                                                                                      SUBJECTIVE STATEMENT: Pt reports only experiencing low level L shoulder pain c exertion  EVAL: Pt reports she is currently having significant limitations with the use of her L UE with above  shoulder movements. Pt reports being in a sling for 9 weeks since her surgical repair. Her L shoulder replacement was originally completed in 2021. Pt would like to regain the functional use of her L UE for reaching above shoulder, hygiene, and dressing.  Hand dominance: Right  PERTINENT HISTORY: Hospital Course: Tanya Duncan is an 77 y.o. female who was admitted 05/15/2023 for operative treatment ofS/P ORIF (open reduction internal fixation) fracture. Patient has severe unremitting pain that affects sleep, daily activities, and work/hobbies. After pre-op clearance the patient was taken to the operating room on 05/15/2023 and underwent  Procedure(s): OPEN REDUCTION INTERNAL FIXATION (ORIF) PERIPROSTHETIC HUMERUS FRACTURE WITH PLATE REMOVAL.    PAIN:  Are you having pain? No With active elevation 0/10 lateral GH area at rest. 3/10 with exertion  PRECAUTIONS: Shoulder  RED FLAGS: None   WEIGHT BEARING RESTRICTIONS: No  FALLS:  Has patient fallen in last 6 months? Yes. Number of falls 1  LIVING ENVIRONMENT: Lives with: lives alone Lives in: House/apartment No issue accessing her home or with mobility within  OCCUPATION: retired  PLOF: Independent and Independent with basic ADLs  PATIENT GOALS: To regain use  NEXT MD VISIT:   OBJECTIVE:  Note: Objective measures were completed at Evaluation unless otherwise noted.  DIAGNOSTIC FINDINGS:  CLINICAL DATA:  Surgery   EXAM: LEFT HUMERUS - 2+ VIEW   COMPARISON:  04/27/2023   FINDINGS: Five low resolution intraoperative spot views of the left shoulder. Total fluoroscopy time was 11 seconds, fluoroscopic dose of 0.89 mGy. Left shoulder replacement with additional plate and fixating screws for previously noted proximal humerus periprosthetic fracture   IMPRESSION: Intraoperative fluoroscopic assistance provided during left shoulder surgery.  PATIENT SURVEYS:  FOTO: Perceived function   53%, predicted   60%    POSTURE: Scoliosis c significant lat R shift, increased thoracic kyphosis and forward head  UPPER EXTREMITY ROM:   Active ROM Right eval Left eval LT 09/28/23 LT 10/10/23 LT 10/12/23  Shoulder flexion A145 A65, AA wall 125, P145 A65 P135 A85 c shrug  Shoulder extension       Shoulder  abduction A145 A50, AA wall 90, P130     Shoulder adduction       Shoulder internal rotation A T7 A T7, 90     Shoulder external rotation A T3 A occiput, 45     Elbow flexion       Elbow extension       Wrist flexion       Wrist extension       Wrist ulnar deviation       Wrist radial deviation       Wrist pronation       Wrist supination       (Blank rows = not tested)  UPPER EXTREMITY MMT:  MMT Right eval Left eval  Shoulder flexion  2  Shoulder extension  3  Shoulder abduction  2  Shoulder adduction  3  Shoulder internal rotation  3  Shoulder external rotation  2  Middle trapezius    Lower trapezius    Elbow flexion    Elbow extension    Wrist flexion    Wrist extension    Wrist ulnar deviation    Wrist radial deviation    Wrist pronation    Wrist supination    Grip strength (lbs)    (Blank rows = not tested)  SHOULDER SPECIAL TESTS: deferred  JOINT MOBILITY TESTING:  N/a  PALPATION:  Not TTP   TODAY'S TREATMENT:  [facility] Adult PT Treatment:                                                DATE: completing in a 35d incline Therapeutic Exercise: Pulley 2 mins for both flexion and scaption Counter slide x15 for shoulder flexion UE ranger flexion x10 S/L shoulder ER 3x10 1# S/L shoulder Adb 3x10 1# Supine shoulder flexion 2x10; 35d x10 Shoulder row 3x10 YTB Shoulder ext 3x10 YTB Supine 90d, dynamic stability  [facility] Adult PT Treatment:                                                DATE: 10/19/23 Therapeutic Exercise: Pulley 2 mins for both flexion and scaption Counter slide 2x10 for shoulder flexion Supine shoulder flexion c wand x10 Supine Chest press c wand  x10 Supine shoulder ER/IR c wand x10 S/L shoulder ER 2x10 1# S/L shoulder Adb 2x10 1# Supine shoulder flexion 2x10; 45d x4 Shoulder row 2x10 YTB Shoulder ext 2x10 YTB  [facility] Adult PT Treatment:                                                DATE: 10/12/23 Therapeutic Exercise: Pulley 2 mins Counter slide 2x10 for shoulder flexion Supine Chest press c wand x4, stopped due to ant GH area pain Supine shoulder ER/IR c wand x4, stopped due to ant GH area pain S/L shoulder ER 2x10  S/L shoulder Adb 2x10  Manual Therapy: PROM for L shoulder flexion, abd, and ER  Modalities: Moist heat to the L shoulder x 10 mins  [facility] Adult PT Treatment:  DATE: 10/10/23 Therapeutic Exercise: Pulley 2 mins Counter slide x10 for shoulder flexion S/L shoulder ER 2x10 1# S/L shoulder Adb 2x10  Supine Chest press c wand x10 Supine shoulder abd c wand x10 Supine shoulder flexion x10 Updated HEP Modalities: Cold pack to the L shoulder x 10 mins                                                                                                                                     PATIENT EDUCATION: Education details: Discussed eval findings, rehab rationale and POC and patient is in agreement  Person educated: Patient Education method: Explanation Education comprehension: verbalized understanding and needs further education  HOME EXERCISE PROGRAM: Access Code: [email or web address] URL: [email or web address] Date: 10/10/2023 Prepared by: Joellyn Rued  Exercises - Supine Shoulder Flexion Extension AAROM with Dowel  - 1-2 x daily - 7 x weekly - 1-2 sets - 10 reps - 3 hold - Supine Shoulder Protraction with Dowel  - 1-2 x daily - 7 x weekly - 1-2 sets - 10 reps - 3 hold - Seated Shoulder Flexion Towel Slide at Table Top Full Range of Motion  - 1-2 x daily - 7 x weekly - 1-2 sets - 10 reps - 3 hold - Standing Single Arm Shoulder Flexion Stretch on Wall  - 1-2 x  daily - 7 x weekly - 1-2 sets - 10 reps - 3 hold - Standing Shoulder Abduction Slides at Wall  - 1-2 x daily - 7 x weekly - 1-2 sets - 10 reps - 3 hold - Shoulder External Rotation and Scapular Retraction with Resistance  - 1-2 x daily - 7 x weekly - 1-2 sets - 10 reps - 3 hold - Standing Shoulder Row with Anchored Resistance  - 1-2 x daily - 7 x weekly - 1-2 sets - 10 reps - 3 hold - Sidelying Shoulder Abduction Palm Forward  - 1 x daily - 7 x weekly - 2 sets - 10 reps - Sidelying Shoulder ER with Towel and Dumbbell  - 1 x daily - 7 x weekly - 2 sets - 10 reps  ASSESSMENT:  CLINICAL IMPRESSION: Pt improved with L shoulder flexion from completing actively for full motion in supine to completing on a 35d incline. PT continued for L shoulder strengthening. Pt is making gains with her L shoulder strength. Pt tolerated PT today without adverse effects  EVAL: Patient is a 77 y.o. female who was seen today for physical therapy evaluation and treatment for L shoulder loss of motion, decreased strength, and overall decreased function following a traumatic fall and subsequent ORIF of periprosthetic humerus sfx. A HEP was initiated for ROM and strengthening. Pt will benefit from skilled PT to address impairments to optimize L shoulder/UE.   OBJECTIVE IMPAIRMENTS: decreased activity tolerance, decreased endurance, decreased knowledge of condition, decreased mobility, decreased ROM, decreased strength, impaired perceived functional ability, impaired UE functional use, improper body mechanics,  postural dysfunction, and pain.   ACTIVITY LIMITATIONS: carrying, lifting, sleeping, and reach over head  PERSONAL FACTORS: Age, Fitness, Past/current experiences, and 1 comorbidity: fibromyalgia  are also affecting patient's functional outcome.   REHAB POTENTIAL: Good  CLINICAL DECISION MAKING: Evolving/moderate complexity  EVALUATION COMPLEXITY: Moderate   GOALS: Goals reviewed with patient? No  SHORT TERM  GOALS: Target date: 09/29/2023    Patient to demonstrate independence in HEP  Baseline: Goal status: MET  2.  Increased L shoulder AROM for flexion to 100d and abd to 80d for progression toward functional LTGs Baseline:  Goal status: NOT MET   LONG TERM GOALS: Target date: 11/10/2023    Patient will score at least 60% on FOTO to signify clinically meaningful improvement in functional abilities.   Baseline: 53% Goal status: INITIAL  2.  Pt will be Ind in a final HEP to maintain achieved LOF  Baseline: started Goal status: INITIAL  3.  Increased L shoulder strength to 3+ or greater in order for the pt if lift 2lbs above shoulder height for in home tasks Baseline: see flow sheets Goal status: INITIAL  4.  Increase L shoulder AROM to flexion 125d, abd 90d, and ER to T1 or greater for improved L UE ability for reaching above shoulder, dressing, and hygiene Baseline: see flow sheets 10/19/23: flex=85d Goal status: Ongoing   PLAN:  PT FREQUENCY: 2x/week  PT DURATION: 8 weeks  PLANNED INTERVENTIONS: 97164- PT Re-evaluation, 97110-Therapeutic exercises, 97530- Therapeutic activity, 97112- Neuromuscular re-education, 97535- Self Care, 40981- Manual therapy, 97014- Electrical stimulation (unattended), Dry Needling, Joint mobilization, Cryotherapy, and Moist heat  PLAN FOR NEXT SESSION: Review FOTO; assess response to HEP; progress therex as indicated; use of modalities, manual therapy; and TPDN as indicated.    Nahal Wanless MS, PT 10/24/23 2:16 PM

## 2023-10-24 ENCOUNTER — Ambulatory Visit: Payer: Medicare Other

## 2023-10-24 DIAGNOSIS — M25612 Stiffness of left shoulder, not elsewhere classified: Secondary | ICD-10-CM

## 2023-10-24 DIAGNOSIS — M6281 Muscle weakness (generalized): Secondary | ICD-10-CM | POA: Diagnosis not present

## 2023-10-25 NOTE — Therapy (Signed)
OUTPATIENT PHYSICAL THERAPY SHOULDER TREATMENT   Patient Name: Tanya Duncan MRN: [id number] DOB:1945-11-11, 77 y.o., female Today's Date: 10/26/2023  END OF SESSION:  PT End of Session - 10/26/23 1337     Visit Number 8    Number of Visits 17    Date for PT Re-Evaluation 11/10/23    Authorization Type MEDICARE PART A AND B    PT Start Time 1332    PT Stop Time 1413    PT Time Calculation (min) 41 min    Activity Tolerance Patient tolerated treatment well    Behavior During Therapy [facility] for tasks assessed/performed                    Past Medical History:  Diagnosis Date   Anxiety    Arthritis    Blood transfusion without reported diagnosis 05/2019   with hip surgery   Complication of anesthesia    Fibromyalgia    Fractured pelvis (HCC) 09/15/2020   right   GERD (gastroesophageal reflux disease)    GI bleed 3664,4034   x2   Hyperlipidemia    Palpitations    Pneumonia 2005   PONV (postoperative nausea and vomiting)    Pre-diabetes    Stomach ulcer 1990   Past Surgical History:  Procedure Laterality Date   ABDOMINAL HYSTERECTOMY     FRACTURE SURGERY Right 1998   ankle   JOINT REPLACEMENT     LUMBAR FUSION  2015   OPEN REDUCTION INTERNAL FIXATION (ORIF) PROXIMAL PHALANX Right 09/21/2020   Procedure: OPEN TREATMENT OF RIGHT LONG FINGER PROXIMAL PHALANX FRACTURE;  Surgeon: Tanya Hook, MD;  Location: [facility] OR;  Service: Orthopedics;  Laterality: Right;  LENGTH OF SURGERY: 75 MIN   ORIF HUMERUS FRACTURE Left    ORIF HUMERUS FRACTURE Left 05/15/2023   Procedure: OPEN REDUCTION INTERNAL FIXATION (ORIF) PERIPROSTHETIC HUMERUS FRACTURE WITH PLATE REMOVAL;  Surgeon: Tanya Broom, MD;  Location: [facility] ORS;  Service: Orthopedics;  Laterality: Left;   ORIF TIBIA FRACTURE Right 1998   with bone graft   PYLOROPLASTY     REVERSE SHOULDER ARTHROPLASTY Left 02/04/2021   Procedure: REVERSE SHOULDER ARTHROPLASTY WITH HARDWARE REMOVAL;  Surgeon: Tanya Broom, MD;   Location: [facility] ORS;  Service: Orthopedics;  Laterality: Left;   TOTAL HIP ARTHROPLASTY  2011   TOTAL HIP ARTHROPLASTY Right 05/14/2019   Procedure: Right Anterior Hip Arthroplasty;  Surgeon: Tanya Corning, MD;  Location: [facility] ORS;  Service: Orthopedics;  Laterality: Right;   TOTAL SHOULDER REPLACEMENT  2014   Patient Active Problem List   Diagnosis Date Noted   S/P ORIF (open reduction internal fixation) fracture 05/15/2023   Palpitations 09/01/2020   Chest pain 09/01/2020   Abnormal ECG 09/01/2020   Moderate episode of recurrent major depressive disorder (HCC) 06/22/2020   Prediabetes 06/22/2020   Mixed hyperlipidemia 06/22/2020   History of total right hip arthroplasty 05/17/2019   Anxiety    Primary osteoarthritis of right hip 05/14/2019   Vitamin D deficiency 01/10/2019   Insomnia due to anxiety and fear 10/25/2017   Chronic prescription benzodiazepine use 10/25/2017   Chronic bilateral low back pain without sciatica 04/27/2017   Osteoarthritis 01/06/2017   GAD (generalized anxiety disorder) 01/06/2017   Rheumatoid arthritis involving multiple sites with positive rheumatoid factor (HCC) 12/12/2016   Status post lumbar spinal fusion 12/12/2016   Fibromyalgia 12/12/2016    PCP: Tanya Harries, MD   REFERRING PROVIDER: Jones Broom, MD  REFERRING DIAG: S/p ORIF L periprosthetic Proximal Humerus fracture  THERAPY  DIAG:  Stiffness of left shoulder, not elsewhere classified  Muscle weakness (generalized)  Rationale for Evaluation and Treatment: Rehabilitation  ONSET DATE: 05/15/23 Surgery  SUBJECTIVE:                                                                                                                                                                                      SUBJECTIVE STATEMENT: I moved my L arm in a certain motion today which hurt a great deal, thankfully the pan resolved.  EVAL: Pt reports she is currently having significant limitations with the use  of her L UE with above shoulder movements. Pt reports being in a sling for 9 weeks since her surgical repair. Her L shoulder replacement was originally completed in 2021. Pt would like to regain the functional use of her L UE for reaching above shoulder, hygiene, and dressing.  Hand dominance: Right  PERTINENT HISTORY: Hospital Course: Tanya Duncan is an 77 y.o. female who was admitted 05/15/2023 for operative treatment ofS/P ORIF (open reduction internal fixation) fracture. Patient has severe unremitting pain that affects sleep, daily activities, and work/hobbies. After pre-op clearance the patient was taken to the operating room on 05/15/2023 and underwent  Procedure(s): OPEN REDUCTION INTERNAL FIXATION (ORIF) PERIPROSTHETIC HUMERUS FRACTURE WITH PLATE REMOVAL.    PAIN:  Are you having pain? No With active elevation 0/10 lateral GH area at rest. 3/10 with exertion  PRECAUTIONS: Shoulder  RED FLAGS: None   WEIGHT BEARING RESTRICTIONS: No  FALLS:  Has patient fallen in last 6 months? Yes. Number of falls 1  LIVING ENVIRONMENT: Lives with: lives alone Lives in: House/apartment No issue accessing her home or with mobility within  OCCUPATION: retired  PLOF: Independent and Independent with basic ADLs  PATIENT GOALS: To regain use  NEXT MD VISIT:   OBJECTIVE:  Note: Objective measures were completed at Evaluation unless otherwise noted.  DIAGNOSTIC FINDINGS:  CLINICAL DATA:  Surgery   EXAM: LEFT HUMERUS - 2+ VIEW   COMPARISON:  04/27/2023   FINDINGS: Five low resolution intraoperative spot views of the left shoulder. Total fluoroscopy time was 11 seconds, fluoroscopic dose of 0.89 mGy. Left shoulder replacement with additional plate and fixating screws for previously noted proximal humerus periprosthetic fracture   IMPRESSION: Intraoperative fluoroscopic assistance provided during left shoulder surgery.  PATIENT SURVEYS:  FOTO: Perceived function   53%, predicted    60%   POSTURE: Scoliosis c significant lat R shift, increased thoracic kyphosis and forward head  UPPER EXTREMITY ROM:   Active ROM Right eval Left eval LT 09/28/23 LT 10/10/23 LT 10/12/23 LT  Shoulder flexion A145 A65, AA wall 125, P145 A65 P135 A85 c  shrug P140 A 85 c shrug  Shoulder extension        Shoulder abduction A145 A50, AA wall 90, P130      Shoulder adduction        Shoulder internal rotation A T7 A T7, 90      Shoulder external rotation A T3 A occiput, 45      Elbow flexion        Elbow extension        Wrist flexion        Wrist extension        Wrist ulnar deviation        Wrist radial deviation        Wrist pronation        Wrist supination        (Blank rows = not tested)  UPPER EXTREMITY MMT:  MMT Right eval Left eval  Shoulder flexion  2  Shoulder extension  3  Shoulder abduction  2  Shoulder adduction  3  Shoulder internal rotation  3  Shoulder external rotation  2  Middle trapezius    Lower trapezius    Elbow flexion    Elbow extension    Wrist flexion    Wrist extension    Wrist ulnar deviation    Wrist radial deviation    Wrist pronation    Wrist supination    Grip strength (lbs)    (Blank rows = not tested)  SHOULDER SPECIAL TESTS: deferred  JOINT MOBILITY TESTING:  N/a  PALPATION:  Not TTP   TODAY'S TREATMENT:  [facility] Adult PT Treatment:                                                DATE: 10/26/23 Therapeutic Exercise: Pulley 2 mins for both flexion and scaption Counter slide x15 for shoulder flexion Wall slides x10 Ball on wall circle, dynam stability S/L shoulder ER 3x10 1# S/L shoulder Abd 3x10 1# Supine shoulder flexion 30d incline 2x7 Shoulder row 3x10 RTB Shoulder ext 3x10 RTB  [facility] Adult PT Treatment:                                                DATE:10/24/23 Therapeutic Exercise: Pulley 2 mins for both flexion and scaption Counter slide x15 for shoulder flexion UE ranger flexion x10 S/L shoulder ER 3x10  1# S/L shoulder Adb 3x10 1# Supine shoulder flexion 2x10; 35d x10 Shoulder row 3x10 YTB Shoulder ext 3x10 YTB Supine 90d, dynamic stability  [facility] Adult PT Treatment:                                                DATE: 10/19/23 Therapeutic Exercise: Pulley 2 mins for both flexion and scaption Counter slide 2x10 for shoulder flexion Supine shoulder flexion c wand x10 Supine Chest press c wand x10 Supine shoulder ER/IR c wand x10 S/L shoulder ER 2x10 1# S/L shoulder Adb 2x10 1# Supine shoulder flexion 2x10; 45d x4 Shoulder row 2x10 YTB Shoulder ext 2x10 YTB  PATIENT EDUCATION: Education details: Discussed eval findings, rehab rationale and POC and patient is in agreement  Person educated: Patient Education method: Explanation Education comprehension: verbalized understanding and needs further education  HOME EXERCISE PROGRAM: Access Code: [email or web address] URL: [email or web address] Date: 10/10/2023 Prepared by: Joellyn Rued  Exercises - Supine Shoulder Flexion Extension AAROM with Dowel  - 1-2 x daily - 7 x weekly - 1-2 sets - 10 reps - 3 hold - Supine Shoulder Protraction with Dowel  - 1-2 x daily - 7 x weekly - 1-2 sets - 10 reps - 3 hold - Seated Shoulder Flexion Towel Slide at Table Top Full Range of Motion  - 1-2 x daily - 7 x weekly - 1-2 sets - 10 reps - 3 hold - Standing Single Arm Shoulder Flexion Stretch on Wall  - 1-2 x daily - 7 x weekly - 1-2 sets - 10 reps - 3 hold - Standing Shoulder Abduction Slides at Wall  - 1-2 x daily - 7 x weekly - 1-2 sets - 10 reps - 3 hold - Shoulder External Rotation and Scapular Retraction with Resistance  - 1-2 x daily - 7 x weekly - 1-2 sets - 10 reps - 3 hold - Standing Shoulder Row with Anchored Resistance  - 1-2 x daily - 7 x weekly - 1-2 sets - 10 reps - 3 hold - Sidelying Shoulder Abduction Palm  Forward  - 1 x daily - 7 x weekly - 2 sets - 10 reps - Sidelying Shoulder ER with Towel and Dumbbell  - 1 x daily - 7 x weekly - 2 sets - 10 reps  ASSESSMENT:  CLINICAL IMPRESSION: PT was continued for L shoulder ROM and strengthening. L shoulder PROM has continued to make gradual improvement following the incident of trauma at Thanksgiving. Pt is currently able to tolerate shoulder flexion in supine s weight, and ER and abd in S/L c 1# wt. Pt tolerated PT today without adverse effects. Pt will continue to benefit from skilled PT to address impairments for improved L shoulder function.  EVAL: Patient is a 77 y.o. female who was seen today for physical therapy evaluation and treatment for L shoulder loss of motion, decreased strength, and overall decreased function following a traumatic fall and subsequent ORIF of periprosthetic humerus sfx. A HEP was initiated for ROM and strengthening. Pt will benefit from skilled PT to address impairments to optimize L shoulder/UE.   OBJECTIVE IMPAIRMENTS: decreased activity tolerance, decreased endurance, decreased knowledge of condition, decreased mobility, decreased ROM, decreased strength, impaired perceived functional ability, impaired UE functional use, improper body mechanics, postural dysfunction, and pain.   ACTIVITY LIMITATIONS: carrying, lifting, sleeping, and reach over head  PERSONAL FACTORS: Age, Fitness, Past/current experiences, and 1 comorbidity: fibromyalgia  are also affecting patient's functional outcome.   REHAB POTENTIAL: Good  CLINICAL DECISION MAKING: Evolving/moderate complexity  EVALUATION COMPLEXITY: Moderate   GOALS: Goals reviewed with patient? No  SHORT TERM GOALS: Target date: 09/29/2023    Patient to demonstrate independence in HEP  Baseline: Goal status: MET  2.  Increased L shoulder AROM for flexion to 100d and abd to 80d for progression toward functional LTGs Baseline:  Goal status: NOT MET   LONG TERM GOALS:  Target date: 11/10/2023    Patient will score at least 60% on FOTO to signify clinically meaningful improvement in functional abilities.   Baseline: 53% Goal status: INITIAL  2.  Pt will be Ind in a final HEP to maintain achieved LOF  Baseline: started  Goal status: INITIAL  3.  Increased L shoulder strength to 3+ or greater in order for the pt if lift 2lbs above shoulder height for in home tasks Baseline: see flow sheets Goal status: INITIAL  4.  Increase L shoulder AROM to flexion 125d, abd 90d, and ER to T1 or greater for improved L UE ability for reaching above shoulder, dressing, and hygiene Baseline: see flow sheets 10/19/23: flex=85d Goal status: Ongoing   PLAN:  PT FREQUENCY: 2x/week  PT DURATION: 8 weeks  PLANNED INTERVENTIONS: 97164- PT Re-evaluation, 97110-Therapeutic exercises, 97530- Therapeutic activity, 97112- Neuromuscular re-education, 97535- Self Care, [id number]- Manual therapy, 97014- Electrical stimulation (unattended), Dry Needling, Joint mobilization, Cryotherapy, and Moist heat  PLAN FOR NEXT SESSION: Review FOTO; assess response to HEP; progress therex as indicated; use of modalities, manual therapy; and TPDN as indicated.    Penney Domanski MS, PT 10/26/23 2:18 PM

## 2023-10-26 ENCOUNTER — Ambulatory Visit: Payer: Medicare Other

## 2023-10-26 DIAGNOSIS — M6281 Muscle weakness (generalized): Secondary | ICD-10-CM | POA: Diagnosis not present

## 2023-10-26 DIAGNOSIS — M25612 Stiffness of left shoulder, not elsewhere classified: Secondary | ICD-10-CM

## 2023-10-31 ENCOUNTER — Encounter: Payer: Medicare Other | Admitting: Physical Therapy

## 2023-11-06 ENCOUNTER — Other Ambulatory Visit: Payer: Self-pay | Admitting: Gastroenterology

## 2023-11-06 DIAGNOSIS — K219 Gastro-esophageal reflux disease without esophagitis: Secondary | ICD-10-CM

## 2023-11-13 ENCOUNTER — Ambulatory Visit: Payer: Medicare Other | Attending: Orthopedic Surgery | Admitting: Physical Therapy

## 2023-11-13 ENCOUNTER — Encounter: Payer: Self-pay | Admitting: Physical Therapy

## 2023-11-13 DIAGNOSIS — M6281 Muscle weakness (generalized): Secondary | ICD-10-CM | POA: Insufficient documentation

## 2023-11-13 DIAGNOSIS — M25612 Stiffness of left shoulder, not elsewhere classified: Secondary | ICD-10-CM | POA: Diagnosis present

## 2023-11-13 NOTE — Therapy (Signed)
 OUTPATIENT PHYSICAL THERAPY SHOULDER TREATMENT / RE-EVALUTATION  Progress Note Reporting Period 09/07/2023 to 11/13/2023  See note below for Objective Data and Assessment of Progress/Goals.     Patient Name: Tanya Duncan MRN: [id number] DOB:07-Feb-1946, 78 y.o., female Today's Date: 11/13/2023  END OF SESSION:  PT End of Session - 11/13/23 1443     Visit Number 9    Number of Visits 25    Date for PT Re-Evaluation 01/08/24    Authorization Type MEDICARE PART A AND B    Progress Note Due on Visit 19    PT Start Time 1445    PT Stop Time 1530    PT Time Calculation (min) 45 min    Equipment Utilized During Treatment Other (comment)    Activity Tolerance Patient tolerated treatment well    Behavior During Therapy [facility] for tasks assessed/performed                     Past Medical History:  Diagnosis Date   Anxiety    Arthritis    Blood transfusion without reported diagnosis 05/2019   with hip surgery   Complication of anesthesia    Fibromyalgia    Fractured pelvis (HCC) 09/15/2020   right   GERD (gastroesophageal reflux disease)    GI bleed 8111,8009   x2   Hyperlipidemia    Palpitations    Pneumonia 2005   PONV (postoperative nausea and vomiting)    Pre-diabetes    Stomach ulcer 1990   Past Surgical History:  Procedure Laterality Date   ABDOMINAL HYSTERECTOMY     FRACTURE SURGERY Right 1998   ankle   JOINT REPLACEMENT     LUMBAR FUSION  2015   OPEN REDUCTION INTERNAL FIXATION (ORIF) PROXIMAL PHALANX Right 09/21/2020   Procedure: OPEN TREATMENT OF RIGHT LONG FINGER PROXIMAL PHALANX FRACTURE;  Surgeon: Sebastian Lenis, MD;  Location: [facility];  Service: Orthopedics;  Laterality: Right;  LENGTH OF SURGERY: 75 MIN   ORIF HUMERUS FRACTURE Left    ORIF HUMERUS FRACTURE Left 05/15/2023   Procedure: OPEN REDUCTION INTERNAL FIXATION (ORIF) PERIPROSTHETIC HUMERUS FRACTURE WITH PLATE REMOVAL;  Surgeon: Dozier Soulier, MD;  Location: [facility] ORS;  Service: Orthopedics;   Laterality: Left;   ORIF TIBIA FRACTURE Right 1998   with bone graft   PYLOROPLASTY     REVERSE SHOULDER ARTHROPLASTY Left 02/04/2021   Procedure: REVERSE SHOULDER ARTHROPLASTY WITH HARDWARE REMOVAL;  Surgeon: Dozier Soulier, MD;  Location: [facility] ORS;  Service: Orthopedics;  Laterality: Left;   TOTAL HIP ARTHROPLASTY  2011   TOTAL HIP ARTHROPLASTY Right 05/14/2019   Procedure: Right Anterior Hip Arthroplasty;  Surgeon: Sheril Coy, MD;  Location: [facility] ORS;  Service: Orthopedics;  Laterality: Right;   TOTAL SHOULDER REPLACEMENT  2014   Patient Active Problem List   Diagnosis Date Noted   S/P ORIF (open reduction internal fixation) fracture 05/15/2023   Palpitations 09/01/2020   Chest pain 09/01/2020   Abnormal ECG 09/01/2020   Moderate episode of recurrent major depressive disorder (HCC) 06/22/2020   Prediabetes 06/22/2020   Mixed hyperlipidemia 06/22/2020   History of total right hip arthroplasty 05/17/2019   Anxiety    Primary osteoarthritis of right hip 05/14/2019   Vitamin D deficiency 01/10/2019   Insomnia due to anxiety and fear 10/25/2017   Chronic prescription benzodiazepine use 10/25/2017   Chronic bilateral low back pain without sciatica 04/27/2017   Osteoarthritis 01/06/2017   GAD (generalized anxiety disorder) 01/06/2017   Rheumatoid arthritis involving multiple sites with positive  rheumatoid factor (HCC) 12/12/2016   Status post lumbar spinal fusion 12/12/2016   Fibromyalgia 12/12/2016    PCP: [name], MD   REFERRING PROVIDER: Dozier Soulier, MD  REFERRING DIAG: S/p ORIF L periprosthetic Proximal Humerus fracture  THERAPY DIAG:  Stiffness of left shoulder, not elsewhere classified  Muscle weakness (generalized)  Rationale for Evaluation and Treatment: Rehabilitation  ONSET DATE: 05/15/23 Surgery  SUBJECTIVE:                                                                                                                                                                                       SUBJECTIVE STATEMENT: Pt stated that she hasn't been as compliant with the initial HEP because it feels like she overdoing it sometimes and it hurts. Pt agreed to updated HEP and stated that it feels more my speed   EVAL: Pt reports she is currently having significant limitations with the use of her L UE with above shoulder movements. Pt reports being in a sling for 9 weeks since her surgical repair. Her L shoulder replacement was originally completed in 2021. Pt would like to regain the functional use of her L UE for reaching above shoulder, hygiene, and dressing.  Hand dominance: Right  PERTINENT HISTORY: Hospital Course: [name] is an 78 y.o. female who was admitted 05/15/2023 for operative treatment ofS/P ORIF (open reduction internal fixation) fracture. Patient has severe unremitting pain that affects sleep, daily activities, and work/hobbies. After pre-op clearance the patient was taken to the operating room on 05/15/2023 and underwent  Procedure(s): OPEN REDUCTION INTERNAL FIXATION (ORIF) PERIPROSTHETIC HUMERUS FRACTURE WITH PLATE REMOVAL.    PAIN:  Are you having pain? No With active elevation 0/10 lateral GH area at rest. 3/10 with exertion  PRECAUTIONS: Shoulder  RED FLAGS: None   WEIGHT BEARING RESTRICTIONS: No  FALLS:  Has patient fallen in last 6 months? Yes. Number of falls 1  LIVING ENVIRONMENT: Lives with: lives alone Lives in: House/apartment No issue accessing her home or with mobility within  OCCUPATION: retired  PLOF: Independent and Independent with basic ADLs  PATIENT GOALS: To regain use  NEXT MD VISIT:   OBJECTIVE:  Note: Objective measures were completed at Evaluation unless otherwise noted.  DIAGNOSTIC FINDINGS:  CLINICAL DATA:  Surgery   EXAM: LEFT HUMERUS - 2+ VIEW   COMPARISON:  04/27/2023   FINDINGS: Five low resolution intraoperative spot views of the left shoulder. Total fluoroscopy time was 11  seconds, fluoroscopic dose of 0.89 mGy. Left shoulder replacement with additional plate and fixating screws for previously noted proximal humerus periprosthetic fracture   IMPRESSION: Intraoperative fluoroscopic assistance provided during left shoulder surgery.  PATIENT SURVEYS:  FOTO: Perceived function   53%, predicted   60%  11/13/2023: Current: 55%  POSTURE: Scoliosis c significant lat R shift, increased thoracic kyphosis and forward head  UPPER EXTREMITY ROM:   Active ROM Right eval Left eval LT 09/28/23 LT 10/10/23 LT 10/12/23 LT LT 11/13/2023  Shoulder flexion A145 A65, AA wall 125, P145 A65 P135 A85 c shrug P140 A 85 c shrug P 160 A 90  Shoulder extension         Shoulder abduction A145 A50, AA wall 90, P130     P110 A80  Shoulder adduction         Shoulder internal rotation A T7 A T7, 90     A t7  Shoulder external rotation A T3 A occiput, 45     A C2  Elbow flexion         Elbow extension         Wrist flexion         Wrist extension         Wrist ulnar deviation         Wrist radial deviation         Wrist pronation         Wrist supination         (Blank rows = not tested)  UPPER EXTREMITY MMT:  MMT Right eval Left eval LUE 11/13/2023  Shoulder flexion  2 3 (up to 90)  Shoulder extension  3 4  Shoulder abduction  2 3(up to 80)  Shoulder adduction  3 4  Shoulder internal rotation  3 4  Shoulder external rotation  2 3  Middle trapezius     Lower trapezius     Elbow flexion     Elbow extension     Wrist flexion     Wrist extension     Wrist ulnar deviation     Wrist radial deviation     Wrist pronation     Wrist supination     Grip strength (lbs)     (Blank rows = not tested)  SHOULDER SPECIAL TESTS: deferred  JOINT MOBILITY TESTING:  N/a  PALPATION:  Not TTP   TODAY'S TREATMENT:  [facility] Adult PT Treatment:                                                DATE: 11/13/2023  Therapeutic Exercise: LUE ranger into flexion and scaption 2x1' MRE  in supine Flexion @120   3x8 8s hold  Neuromuscular re-ed: Rhythmic stabailization in supine, shoulder flexion @90  3x1' PNF UE d1/d2 flexion in supine 2x10     [facility] Adult PT Treatment:                                                DATE: 10/26/23 Therapeutic Exercise: Pulley 2 mins for both flexion and scaption Counter slide x15 for shoulder flexion Wall slides x10 Ball on wall circle, dynam stability S/L shoulder ER 3x10 1# S/L shoulder Abd 3x10 1# Supine shoulder flexion 30d incline 2x7 Shoulder row 3x10 RTB Shoulder ext 3x10 RTB  PATIENT EDUCATION: Education details: Discussed eval findings, rehab rationale and POC and patient is in agreement  Person educated: Patient Education method: Explanation Education comprehension: verbalized understanding and needs further education  HOME EXERCISE PROGRAM: Updated HEP Access Code: [email or web address] URL: [email or web address] Date: 11/13/2023 Prepared by: Mabel Kiang  Exercises - Supine Shoulder Flexion with Anchored Resistance  - 1 x daily - 7 x weekly - 3 sets - 6-10 reps - 5s hold - Supine Single Arm Shoulder PNF D1 Flexion  - 1 x daily - 7 x weekly - 2 sets - 6-10 reps - 3 hold - Supine PNF D2 with Resistance  - 1 x daily - 7 x weekly - 2 sets - 6-10 reps - 3s hold   Initial HEP Access Code: [email or web address] URL: [email or web address] Date: 10/10/2023 Prepared by: Dasie Daft  Exercises - Supine Shoulder Flexion Extension AAROM with Dowel  - 1-2 x daily - 7 x weekly - 1-2 sets - 10 reps - 3 hold - Supine Shoulder Protraction with Dowel  - 1-2 x daily - 7 x weekly - 1-2 sets - 10 reps - 3 hold - Seated Shoulder Flexion Towel Slide at Table Top Full Range of Motion  - 1-2 x daily - 7 x weekly - 1-2 sets - 10 reps - 3 hold - Standing Single Arm Shoulder Flexion Stretch on Wall  - 1-2 x daily - 7 x weekly - 1-2 sets - 10 reps - 3 hold -  Standing Shoulder Abduction Slides at Wall  - 1-2 x daily - 7 x weekly - 1-2 sets - 10 reps - 3 hold - Shoulder External Rotation and Scapular Retraction with Resistance  - 1-2 x daily - 7 x weekly - 1-2 sets - 10 reps - 3 hold - Standing Shoulder Row with Anchored Resistance  - 1-2 x daily - 7 x weekly - 1-2 sets - 10 reps - 3 hold - Sidelying Shoulder Abduction Palm Forward  - 1 x daily - 7 x weekly - 2 sets - 10 reps - Sidelying Shoulder ER with Towel and Dumbbell  - 1 x daily - 7 x weekly - 2 sets - 10 reps  ASSESSMENT:  CLINICAL IMPRESSION: Pt attended physical therapy session for continuation of treatment and re-evaluation regarding humerus fx. Pt tolerated treatment good and demonstrated improvement with shoulder girdle strength as well as active mobility. Some difficulties continued with overhead endurance and lifting against gravity. Pt required moderate vocal and tactile cues for safe and appropriate performance of today's interventions and HEP comprehension. By the end of session alongside a handout, pt demonstrated and verbalized understanding of all interventions and provided education. Pt would continue to benefit from overhead strength and mobility, starting with gravity eliminated and progress towards inclination and against gravity. Pt met all STG and is progressing towards all LTG. Pt continues to require the intervention of skilled outpatient physical therapy to address the aforementioned deficits and progress towards a functional level in line with therapeutic goals.    EVAL: Patient is a 78 y.o. female who was seen today for physical therapy evaluation and treatment for L shoulder loss of motion, decreased strength, and overall decreased function following a traumatic fall and subsequent ORIF of periprosthetic humerus sfx. A HEP was initiated for ROM and strengthening. Pt will benefit from skilled PT to address impairments to optimize L shoulder/UE.   OBJECTIVE IMPAIRMENTS:  decreased activity tolerance, decreased endurance, decreased knowledge of condition, decreased mobility, decreased ROM, decreased strength, impaired perceived functional ability,  impaired UE functional use, improper body mechanics, postural dysfunction, and pain.   ACTIVITY LIMITATIONS: carrying, lifting, sleeping, and reach over head  PERSONAL FACTORS: Age, Fitness, Past/current experiences, and 1 comorbidity: fibromyalgia  are also affecting patient's functional outcome.   REHAB POTENTIAL: Good  CLINICAL DECISION MAKING: Evolving/moderate complexity  EVALUATION COMPLEXITY: Moderate   GOALS: Goals reviewed with patient? No  SHORT TERM GOALS: Target date: 09/29/2023    Patient to demonstrate independence in HEP  Baseline: Goal status: MET  2.  Increased L shoulder AROM for flexion to 100d and abd to 80d for progression toward functional LTGs Baseline:  Goal status: MET 11/13/2023   LONG TERM GOALS: Target date: 01/08/2024    Patient will score at least 60% on FOTO to signify clinically meaningful improvement in functional abilities.   Baseline: 53% Goal status: PROGRESSING: 55% (11/13/2023)  2.  Pt will be Ind in a final HEP to maintain achieved LOF  Baseline: started Goal status: PROGRESSING 11/13/2023   3.  Increased L shoulder strength to 3+ or greater in order for the pt if lift 2lbs above shoulder height for in home tasks Baseline: see flow sheets Goal status: PROGRESSING 11/13/2023  4.  Increase L shoulder AROM to flexion 125d, abd 90d, and ER to T1 or greater for improved L UE ability for reaching above shoulder, dressing, and hygiene Baseline: see flow sheets 10/19/23: flex=85d Goal status: PROGRESSING 11/13/2023   PLAN:  PT FREQUENCY: 2x/week  PT DURATION: 8 weeks  PLANNED INTERVENTIONS: 97164- PT Re-evaluation, 97110-Therapeutic exercises, 97530- Therapeutic activity, 97112- Neuromuscular re-education, 97535- Self Care, [id number]- Manual therapy, 97014- Electrical  stimulation (unattended), Dry Needling, Joint mobilization, Cryotherapy, and Moist heat  PLAN FOR NEXT SESSION: Review FOTO; assess response to HEP; progress therex as indicated; use of modalities, manual therapy; and TPDN as indicated.    Mabel Kiang, PT, DPT 11/13/2023, 3:37 PM

## 2023-11-14 NOTE — Therapy (Signed)
 OUTPATIENT PHYSICAL THERAPY SHOULDER TREATMENT / RE-EVALUTATION    See note below for Objective Data and Assessment of Progress/Goals.     Patient Name: Tanya Duncan MRN: [id number] DOB:09/16/1946, 78 y.o., female Today's Date: 11/15/2023  END OF SESSION:  PT End of Session - 11/15/23 1327     Visit Number 10    Number of Visits 25    Date for PT Re-Evaluation 01/08/24    Authorization Type MEDICARE PART A AND B    Progress Note Due on Visit 10    PT Start Time 1330    PT Stop Time 1412    PT Time Calculation (min) 42 min    Equipment Utilized During Treatment --    Activity Tolerance Patient tolerated treatment well    Behavior During Therapy [facility] for tasks assessed/performed                      Past Medical History:  Diagnosis Date   Anxiety    Arthritis    Blood transfusion without reported diagnosis 05/2019   with hip surgery   Complication of anesthesia    Fibromyalgia    Fractured pelvis (HCC) 09/15/2020   right   GERD (gastroesophageal reflux disease)    GI bleed 8111,8009   x2   Hyperlipidemia    Palpitations    Pneumonia 2005   PONV (postoperative nausea and vomiting)    Pre-diabetes    Stomach ulcer 1990   Past Surgical History:  Procedure Laterality Date   ABDOMINAL HYSTERECTOMY     FRACTURE SURGERY Right 1998   ankle   JOINT REPLACEMENT     LUMBAR FUSION  2015   OPEN REDUCTION INTERNAL FIXATION (ORIF) PROXIMAL PHALANX Right 09/21/2020   Procedure: OPEN TREATMENT OF RIGHT LONG FINGER PROXIMAL PHALANX FRACTURE;  Surgeon: Sebastian Lenis, MD;  Location: [facility] OR;  Service: Orthopedics;  Laterality: Right;  LENGTH OF SURGERY: 75 MIN   ORIF HUMERUS FRACTURE Left    ORIF HUMERUS FRACTURE Left 05/15/2023   Procedure: OPEN REDUCTION INTERNAL FIXATION (ORIF) PERIPROSTHETIC HUMERUS FRACTURE WITH PLATE REMOVAL;  Surgeon: Dozier Soulier, MD;  Location: [facility] OR[facility];  Service: Orthopedics;  Laterality: Left;   ORIF TIBIA FRACTURE Right 1998   with bone  graft   PYLOROPLASTY     REVERSE SHOULDER ARTHROPLASTY Left 02/04/2021   Procedure: REVERSE SHOULDER ARTHROPLASTY WITH HARDWARE REMOVAL;  Surgeon: Dozier Soulier, MD;  Location: [facility];  Service: Orthopedics;  Laterality: Left;   TOTAL HIP ARTHROPLASTY  2011   TOTAL HIP ARTHROPLASTY Right 05/14/2019   Procedure: Right Anterior Hip Arthroplasty;  Surgeon: Sheril Coy, MD;  Location: [facility];  Service: Orthopedics;  Laterality: Right;   TOTAL SHOULDER REPLACEMENT  2014   Patient Active Problem List   Diagnosis Date Noted   S/P ORIF (open reduction internal fixation) fracture 05/15/2023   Palpitations 09/01/2020   Chest pain 09/01/2020   Abnormal ECG 09/01/2020   Moderate episode of recurrent major depressive disorder (HCC) 06/22/2020   Prediabetes 06/22/2020   Mixed hyperlipidemia 06/22/2020   History of total right hip arthroplasty 05/17/2019   Anxiety    Primary osteoarthritis of right hip 05/14/2019   Vitamin D deficiency 01/10/2019   Insomnia due to anxiety and fear 10/25/2017   Chronic prescription benzodiazepine use 10/25/2017   Chronic bilateral low back pain without sciatica 04/27/2017   Osteoarthritis 01/06/2017   GAD (generalized anxiety disorder) 01/06/2017   Rheumatoid arthritis involving multiple sites with positive rheumatoid factor (HCC) 12/12/2016  Status post lumbar spinal fusion 12/12/2016   Fibromyalgia 12/12/2016    PCP: [name], MD   REFERRING PROVIDER: Dozier Soulier, MD  REFERRING DIAG: S/p ORIF L periprosthetic Proximal Humerus fracture  THERAPY DIAG:  Stiffness of left shoulder, not elsewhere classified  Muscle weakness (generalized)  Rationale for Evaluation and Treatment: Rehabilitation  ONSET DATE: 05/15/23 Surgery  SUBJECTIVE:                                                                                                                                                                                      SUBJECTIVE STATEMENT: Pt  reports no concerns re: her L shoulder.  EVAL: Pt reports she is currently having significant limitations with the use of her L UE with above shoulder movements. Pt reports being in a sling for 9 weeks since her surgical repair. Her L shoulder replacement was originally completed in 2021. Pt would like to regain the functional use of her L UE for reaching above shoulder, hygiene, and dressing.  Hand dominance: Right  PERTINENT HISTORY: Hospital Course: [name] is an 77 y.o. female who was admitted 05/15/2023 for operative treatment ofS/P ORIF (open reduction internal fixation) fracture. Patient has severe unremitting pain that affects sleep, daily activities, and work/hobbies. After pre-op clearance the patient was taken to the operating room on 05/15/2023 and underwent  Procedure(s): OPEN REDUCTION INTERNAL FIXATION (ORIF) PERIPROSTHETIC HUMERUS FRACTURE WITH PLATE REMOVAL.    PAIN:  Are you having pain? No With active elevation 0/10 lateral GH area at rest. 3/10 with exertion  PRECAUTIONS: Shoulder  RED FLAGS: None   WEIGHT BEARING RESTRICTIONS: No  FALLS:  Has patient fallen in last 6 months? Yes. Number of falls 1  LIVING ENVIRONMENT: Lives with: lives alone Lives in: House/apartment No issue accessing her home or with mobility within  OCCUPATION: retired  PLOF: Independent and Independent with basic ADLs  PATIENT GOALS: To regain use  NEXT MD VISIT:   OBJECTIVE:  Note: Objective measures were completed at Evaluation unless otherwise noted.  DIAGNOSTIC FINDINGS:  CLINICAL DATA:  Surgery   EXAM: LEFT HUMERUS - 2+ VIEW   COMPARISON:  04/27/2023   FINDINGS: Five low resolution intraoperative spot views of the left shoulder. Total fluoroscopy time was 11 seconds, fluoroscopic dose of 0.89 mGy. Left shoulder replacement with additional plate and fixating screws for previously noted proximal humerus periprosthetic fracture   IMPRESSION: Intraoperative  fluoroscopic assistance provided during left shoulder surgery.  PATIENT SURVEYS:  FOTO: Perceived function   53%, predicted   60%  11/13/2023: Current: 55%  POSTURE: Scoliosis c significant lat R shift, increased thoracic kyphosis and forward head  UPPER EXTREMITY ROM:  Active ROM Right eval Left eval LT 09/28/23 LT 10/10/23 LT 10/12/23 LT LT 11/13/2023  Shoulder flexion A145 A65, AA wall 125, P145 A65 P135 A85 c shrug P140 A 85 c shrug P 160 A 90  Shoulder extension         Shoulder abduction A145 A50, AA wall 90, P130     P110 A80  Shoulder adduction         Shoulder internal rotation A T7 A T7, 90     A t7  Shoulder external rotation A T3 A occiput, 45     A C2  Elbow flexion         Elbow extension         Wrist flexion         Wrist extension         Wrist ulnar deviation         Wrist radial deviation         Wrist pronation         Wrist supination         (Blank rows = not tested)  UPPER EXTREMITY MMT:  MMT Right eval Left eval LUE 11/13/2023  Shoulder flexion  2 3 (up to 90)  Shoulder extension  3 4  Shoulder abduction  2 3(up to 80)  Shoulder adduction  3 4  Shoulder internal rotation  3 4  Shoulder external rotation  2 3  Middle trapezius     Lower trapezius     Elbow flexion     Elbow extension     Wrist flexion     Wrist extension     Wrist ulnar deviation     Wrist radial deviation     Wrist pronation     Wrist supination     Grip strength (lbs)     (Blank rows = not tested)  SHOULDER SPECIAL TESTS: deferred  JOINT MOBILITY TESTING:  N/a  PALPATION:  Not TTP   TODAY'S TREATMENT:  [facility] Adult PT Treatment:                                                DATE: 11/15/23 Therapeutic Exercise: Pulley 2 mins for both flexion and scaption Counter slide x15 for shoulder flexion Wall slides x10 Supine shoulder flexion and D2 x10 Ball on wall circle, dynam stability S/L shoulder ER 2x10 2# S/L shoulder Abd 2x10 2# Supine shoulder flexion 30d  incline 2x8 Standing shoulder ER, partial flexion, partial abd 2x10 YTB each Shoulder row 3x10 RTB Shoulder ext 3x10 RTB  [facility] Adult PT Treatment:                                                DATE: 11/13/2023  Therapeutic Exercise: LUE ranger into flexion and scaption 2x1' MRE in supine Flexion @120   3x8 8s hold  Neuromuscular re-ed: Rhythmic stabailization in supine, shoulder flexion @90  3x1' PNF UE d1/d2 flexion in supine 2x10  [facility] Adult PT Treatment:  DATE: 10/26/23 Therapeutic Exercise: Pulley 2 mins for both flexion and scaption Counter slide x15 for shoulder flexion Wall slides x10 Ball on wall circle, dynam stability S/L shoulder ER 3x10 1# S/L shoulder Abd 3x10 1# Supine shoulder flexion 30d incline 2x7 Shoulder row 3x10 RTB Shoulder ext 3x10 RTB                                                                     PATIENT EDUCATION: Education details: Discussed eval findings, rehab rationale and POC and patient is in agreement  Person educated: Patient Education method: Explanation Education comprehension: verbalized understanding and needs further education  HOME EXERCISE PROGRAM: Updated HEP Access Code: [email or web address] URL: [email or web address] Date: 11/13/2023 Prepared by: Mabel Kiang  Exercises - Supine Shoulder Flexion with Anchored Resistance  - 1 x daily - 7 x weekly - 3 sets - 6-10 reps - 5s hold - Supine Single Arm Shoulder PNF D1 Flexion  - 1 x daily - 7 x weekly - 2 sets - 6-10 reps - 3 hold - Supine PNF D2 with Resistance  - 1 x daily - 7 x weekly - 2 sets - 6-10 reps - 3s hold   Initial HEP Access Code: [email or web address] URL: [email or web address] Date: 10/10/2023 Prepared by: Dasie Daft  Exercises - Supine Shoulder Flexion Extension AAROM with Dowel  - 1-2 x daily - 7 x weekly - 1-2 sets - 10 reps - 3 hold - Supine Shoulder Protraction with Dowel  - 1-2 x daily - 7 x weekly - 1-2  sets - 10 reps - 3 hold - Seated Shoulder Flexion Towel Slide at Table Top Full Range of Motion  - 1-2 x daily - 7 x weekly - 1-2 sets - 10 reps - 3 hold - Standing Single Arm Shoulder Flexion Stretch on Wall  - 1-2 x daily - 7 x weekly - 1-2 sets - 10 reps - 3 hold - Standing Shoulder Abduction Slides at Wall  - 1-2 x daily - 7 x weekly - 1-2 sets - 10 reps - 3 hold - Shoulder External Rotation and Scapular Retraction with Resistance  - 1-2 x daily - 7 x weekly - 1-2 sets - 10 reps - 3 hold - Standing Shoulder Row with Anchored Resistance  - 1-2 x daily - 7 x weekly - 1-2 sets - 10 reps - 3 hold - Sidelying Shoulder Abduction Palm Forward  - 1 x daily - 7 x weekly - 2 sets - 10 reps - Sidelying Shoulder ER with Towel and Dumbbell  - 1 x daily - 7 x weekly - 2 sets - 10 reps  ASSESSMENT:  CLINICAL IMPRESSION: PT was completed for R shoulder strengthening of the RC and periscapular muscles. Resistance level for S/L ER and abd was increased. RC strength is limiting progression of shoulder flexion against gravity. Pt's strength and functional use of the L UE is progressing gradually. Pt tolerated prescribed therex without adverse effects. Pt will continue to benefit from skilled PT to address impairments for improved L shoulder function.    EVAL: Patient is a 78 y.o. female who was seen today for physical therapy evaluation and treatment for L shoulder loss of motion, decreased strength, and overall decreased function following a traumatic fall and  subsequent ORIF of periprosthetic humerus sfx. A HEP was initiated for ROM and strengthening. Pt will benefit from skilled PT to address impairments to optimize L shoulder/UE.   OBJECTIVE IMPAIRMENTS: decreased activity tolerance, decreased endurance, decreased knowledge of condition, decreased mobility, decreased ROM, decreased strength, impaired perceived functional ability, impaired UE functional use, improper body mechanics, postural dysfunction, and  pain.   ACTIVITY LIMITATIONS: carrying, lifting, sleeping, and reach over head  PERSONAL FACTORS: Age, Fitness, Past/current experiences, and 1 comorbidity: fibromyalgia  are also affecting patient's functional outcome.   REHAB POTENTIAL: Good  CLINICAL DECISION MAKING: Evolving/moderate complexity  EVALUATION COMPLEXITY: Moderate   GOALS: Goals reviewed with patient? No  SHORT TERM GOALS: Target date: 09/29/2023    Patient to demonstrate independence in HEP  Baseline: Goal status: MET  2.  Increased L shoulder AROM for flexion to 100d and abd to 80d for progression toward functional LTGs Baseline:  Goal status: MET 11/13/2023   LONG TERM GOALS: Target date: 01/08/2024    Patient will score at least 60% on FOTO to signify clinically meaningful improvement in functional abilities.   Baseline: 53% Goal status: PROGRESSING: 55% (11/13/2023)  2.  Pt will be Ind in a final HEP to maintain achieved LOF  Baseline: started Goal status: PROGRESSING 11/13/2023   3.  Increased L shoulder strength to 3+ or greater in order for the pt if lift 2lbs above shoulder height for in home tasks Baseline: see flow sheets Goal status: PROGRESSING 11/13/2023  4.  Increase L shoulder AROM to flexion 125d, abd 90d, and ER to T1 or greater for improved L UE ability for reaching above shoulder, dressing, and hygiene Baseline: see flow sheets 10/19/23: flex=85d Goal status: PROGRESSING 11/13/2023   PLAN:  PT FREQUENCY: 2x/week  PT DURATION: 8 weeks  PLANNED INTERVENTIONS: 97164- PT Re-evaluation, 97110-Therapeutic exercises, 97530- Therapeutic activity, 97112- Neuromuscular re-education, 97535- Self Care, [id number]- Manual therapy, 97014- Electrical stimulation (unattended), Dry Needling, Joint mobilization, Cryotherapy, and Moist heat  PLAN FOR NEXT SESSION: Review FOTO; assess response to HEP; progress therex as indicated; use of modalities, manual therapy; and TPDN as indicated.   Valisa Karpel MS,  PT 11/15/23 2:27 PM

## 2023-11-15 ENCOUNTER — Ambulatory Visit: Payer: Medicare Other

## 2023-11-15 DIAGNOSIS — M25612 Stiffness of left shoulder, not elsewhere classified: Secondary | ICD-10-CM

## 2023-11-15 DIAGNOSIS — M6281 Muscle weakness (generalized): Secondary | ICD-10-CM

## 2023-11-20 ENCOUNTER — Encounter: Payer: Medicare Other | Admitting: Physical Therapy

## 2023-11-22 ENCOUNTER — Ambulatory Visit: Payer: Medicare Other

## 2023-11-22 NOTE — Therapy (Signed)
OUTPATIENT PHYSICAL THERAPY SHOULDER TREATMENT    See note below for Objective Data and Assessment of Progress/Goals.     Patient Name: Tanya Duncan MRN: [id number] DOB:12/03/1945, 78 y.o., female Today's Date: 11/24/2023  END OF SESSION:  PT End of Session - 11/24/23 1108     Visit Number 11    Number of Visits 25    Date for PT Re-Evaluation 01/08/24    Authorization Type MEDICARE PART A AND B    PT Start Time 1100    PT Stop Time 1142    PT Time Calculation (min) 42 min    Activity Tolerance Patient tolerated treatment well    Behavior During Therapy [facility] for tasks assessed/performed                       Past Medical History:  Diagnosis Date   Anxiety    Arthritis    Blood transfusion without reported diagnosis 05/2019   with hip surgery   Complication of anesthesia    Fibromyalgia    Fractured pelvis (HCC) 09/15/2020   right   GERD (gastroesophageal reflux disease)    GI bleed [id number]   x2   Hyperlipidemia    Palpitations    Pneumonia 2005   PONV (postoperative nausea and vomiting)    Pre-diabetes    Stomach ulcer 1990   Past Surgical History:  Procedure Laterality Date   ABDOMINAL HYSTERECTOMY     FRACTURE SURGERY Right 1998   ankle   JOINT REPLACEMENT     LUMBAR FUSION  2015   OPEN REDUCTION INTERNAL FIXATION (ORIF) PROXIMAL PHALANX Right 09/21/2020   Procedure: OPEN TREATMENT OF RIGHT LONG FINGER PROXIMAL PHALANX FRACTURE;  Surgeon: Mack Hook, MD;  Location: [facility] OR;  Service: Orthopedics;  Laterality: Right;  LENGTH OF SURGERY: 75 MIN   ORIF HUMERUS FRACTURE Left    ORIF HUMERUS FRACTURE Left 05/15/2023   Procedure: OPEN REDUCTION INTERNAL FIXATION (ORIF) PERIPROSTHETIC HUMERUS FRACTURE WITH PLATE REMOVAL;  Surgeon: Jones Broom, MD;  Location: [facility] ORS;  Service: Orthopedics;  Laterality: Left;   ORIF TIBIA FRACTURE Right 1998   with bone graft   PYLOROPLASTY     REVERSE SHOULDER ARTHROPLASTY Left 02/04/2021   Procedure:  REVERSE SHOULDER ARTHROPLASTY WITH HARDWARE REMOVAL;  Surgeon: Jones Broom, MD;  Location: [facility] OR[facility];  Service: Orthopedics;  Laterality: Left;   TOTAL HIP ARTHROPLASTY  2011   TOTAL HIP ARTHROPLASTY Right 05/14/2019   Procedure: Right Anterior Hip Arthroplasty;  Surgeon: Marcene Corning, MD;  Location: [facility] OR[facility];  Service: Orthopedics;  Laterality: Right;   TOTAL SHOULDER REPLACEMENT  2014   Patient Active Problem List   Diagnosis Date Noted   S/P ORIF (open reduction internal fixation) fracture 05/15/2023   Palpitations 09/01/2020   Chest pain 09/01/2020   Abnormal ECG 09/01/2020   Moderate episode of recurrent major depressive disorder (HCC) 06/22/2020   Prediabetes 06/22/2020   Mixed hyperlipidemia 06/22/2020   History of total right hip arthroplasty 05/17/2019   Anxiety    Primary osteoarthritis of right hip 05/14/2019   Vitamin D deficiency 01/10/2019   Insomnia due to anxiety and fear 10/25/2017   Chronic prescription benzodiazepine use 10/25/2017   Chronic bilateral low back pain without sciatica 04/27/2017   Osteoarthritis 01/06/2017   GAD (generalized anxiety disorder) 01/06/2017   Rheumatoid arthritis involving multiple sites with positive rheumatoid factor (HCC) 12/12/2016   Status post lumbar spinal fusion 12/12/2016   Fibromyalgia 12/12/2016    PCP: Tracey Harries, MD  REFERRING PROVIDER: Jones Broom, MD  REFERRING DIAG: S/p ORIF L periprosthetic Proximal Humerus fracture  THERAPY DIAG:  Stiffness of left shoulder, not elsewhere classified  Muscle weakness (generalized)  Rationale for Evaluation and Treatment: Rehabilitation  ONSET DATE: 05/15/23 Surgery  SUBJECTIVE:                                                                                                                                                                                      SUBJECTIVE STATEMENT: Pt reports no concerns re: her L shoulder.  EVAL: Pt reports she is currently having  significant limitations with the use of her L UE with above shoulder movements. Pt reports being in a sling for 9 weeks since her surgical repair. Her L shoulder replacement was originally completed in 2021. Pt would like to regain the functional use of her L UE for reaching above shoulder, hygiene, and dressing.  Hand dominance: Right  PERTINENT HISTORY: Hospital Course: Tanya Duncan is an 78 y.o. female who was admitted 05/15/2023 for operative treatment ofS/P ORIF (open reduction internal fixation) fracture. Patient has severe unremitting pain that affects sleep, daily activities, and work/hobbies. After pre-op clearance the patient was taken to the operating room on 05/15/2023 and underwent  Procedure(s): OPEN REDUCTION INTERNAL FIXATION (ORIF) PERIPROSTHETIC HUMERUS FRACTURE WITH PLATE REMOVAL.    PAIN:  Are you having pain? No With active elevation 0/10 lateral GH area at rest. 3/10 with exertion  PRECAUTIONS: Shoulder  RED FLAGS: None   WEIGHT BEARING RESTRICTIONS: No  FALLS:  Has patient fallen in last 6 months? Yes. Number of falls 1  LIVING ENVIRONMENT: Lives with: lives alone Lives in: House/apartment No issue accessing her home or with mobility within  OCCUPATION: retired  PLOF: Independent and Independent with basic ADLs  PATIENT GOALS: To regain use  NEXT MD VISIT:   OBJECTIVE:  Note: Objective measures were completed at Evaluation unless otherwise noted.  DIAGNOSTIC FINDINGS:  CLINICAL DATA:  Surgery   EXAM: LEFT HUMERUS - 2+ VIEW   COMPARISON:  04/27/2023   FINDINGS: Five low resolution intraoperative spot views of the left shoulder. Total fluoroscopy time was 11 seconds, fluoroscopic dose of 0.89 mGy. Left shoulder replacement with additional plate and fixating screws for previously noted proximal humerus periprosthetic fracture   IMPRESSION: Intraoperative fluoroscopic assistance provided during left shoulder surgery.  PATIENT SURVEYS:  FOTO:  Perceived function   53%, predicted   60%  11/13/2023: Current: 55%  POSTURE: Scoliosis c significant lat R shift, increased thoracic kyphosis and forward head  UPPER EXTREMITY ROM:   Active ROM Right eval Left eval LT 09/28/23 LT 10/10/23 LT 10/12/23 LT LT 11/13/2023  Shoulder  flexion A145 A65, AA wall 125, P145 A65 P135 A85 c shrug P140 A 85 c shrug P 160 A 90  Shoulder extension         Shoulder abduction A145 A50, AA wall 90, P130     P110 A80  Shoulder adduction         Shoulder internal rotation A T7 A T7, 90     A t7  Shoulder external rotation A T3 A occiput, 45     A C2  Elbow flexion         Elbow extension         Wrist flexion         Wrist extension         Wrist ulnar deviation         Wrist radial deviation         Wrist pronation         Wrist supination         (Blank rows = not tested)  UPPER EXTREMITY MMT:  MMT Right eval Left eval LUE 11/13/2023  Shoulder flexion  2 3 (up to 90)  Shoulder extension  3 4  Shoulder abduction  2 3(up to 80)  Shoulder adduction  3 4  Shoulder internal rotation  3 4  Shoulder external rotation  2 3  Middle trapezius     Lower trapezius     Elbow flexion     Elbow extension     Wrist flexion     Wrist extension     Wrist ulnar deviation     Wrist radial deviation     Wrist pronation     Wrist supination     Grip strength (lbs)     (Blank rows = not tested)  SHOULDER SPECIAL TESTS: deferred  JOINT MOBILITY TESTING:  N/a  PALPATION:  Not TTP   TODAY'S TREATMENT:  [facility] Adult PT Treatment:                                                DATE: 11/24/23 Therapeutic Exercise: Pulley 2 mins for both flexion and scaption L shoulder flexion supine 2x10 L shoulder walls slides 2x10 S/L ER 2# 3x10 S/L abd 2# 3x10 Supine serratus press 2# 3x7 ER stability c side stepping x8 RTB  [facility] Adult PT Treatment:                                                DATE: 11/15/23 Therapeutic Exercise: Pulley 2 mins for both  flexion and scaption Counter slide x15 for shoulder flexion Wall slides x10 Supine shoulder flexion and D2 x10 Ball on wall circle, dynam stability S/L shoulder ER 2x10 2# S/L shoulder Abd 2x10 2# Supine shoulder flexion 30d incline 2x8 Standing shoulder ER, partial flexion, partial abd 2x10 YTB each Shoulder row 3x10 RTB Shoulder ext 3x10 RTB  [facility] Adult PT Treatment:                                                DATE: 11/13/2023  Therapeutic Exercise: LUE ranger into flexion and  scaption 2x1' MRE in supine Flexion @120   3x8 8s hold  Neuromuscular re-ed: Rhythmic stabailization in supine, shoulder flexion @90  3x1' PNF UE d1/d2 flexion in supine 2x10                                                                     PATIENT EDUCATION: Education details: Discussed eval findings, rehab rationale and POC and patient is in agreement  Person educated: Patient Education method: Explanation Education comprehension: verbalized understanding and needs further education  HOME EXERCISE PROGRAM: Updated HEP Access Code: [email or web address] URL: [email or web address] Date: 11/13/2023 Prepared by: Sheliah Plane  Exercises - Supine Shoulder Flexion with Anchored Resistance  - 1 x daily - 7 x weekly - 3 sets - 6-10 reps - 5s hold - Supine Single Arm Shoulder PNF D1 Flexion  - 1 x daily - 7 x weekly - 2 sets - 6-10 reps - 3 hold - Supine PNF D2 with Resistance  - 1 x daily - 7 x weekly - 2 sets - 6-10 reps - 3s hold   Initial HEP Access Code: [email or web address] URL: [email or web address] Date: 10/10/2023 Prepared by: Joellyn Rued  Exercises - Supine Shoulder Flexion Extension AAROM with Dowel  - 1-2 x daily - 7 x weekly - 1-2 sets - 10 reps - 3 hold - Supine Shoulder Protraction with Dowel  - 1-2 x daily - 7 x weekly - 1-2 sets - 10 reps - 3 hold - Seated Shoulder Flexion Towel Slide at Table Top Full Range of Motion  - 1-2 x daily - 7 x weekly - 1-2 sets - 10 reps - 3 hold -  Standing Single Arm Shoulder Flexion Stretch on Wall  - 1-2 x daily - 7 x weekly - 1-2 sets - 10 reps - 3 hold - Standing Shoulder Abduction Slides at Wall  - 1-2 x daily - 7 x weekly - 1-2 sets - 10 reps - 3 hold - Shoulder External Rotation and Scapular Retraction with Resistance  - 1-2 x daily - 7 x weekly - 1-2 sets - 10 reps - 3 hold - Standing Shoulder Row with Anchored Resistance  - 1-2 x daily - 7 x weekly - 1-2 sets - 10 reps - 3 hold - Sidelying Shoulder Abduction Palm Forward  - 1 x daily - 7 x weekly - 2 sets - 10 reps - Sidelying Shoulder ER with Towel and Dumbbell  - 1 x daily - 7 x weekly - 2 sets - 10 reps  ASSESSMENT:  CLINICAL IMPRESSION: PT was completed for R shoulder strengthening of the RC and periscapular muscles. Pt is able to complete shoulder flexion in supine, but is not able to in an inclined position. Will continue to work on [facility] strength to progress active L shoulder flexion passed 85d for above shoulder reaching.  EVAL: Patient is a 78 y.o. female who was seen today for physical therapy evaluation and treatment for L shoulder loss of motion, decreased strength, and overall decreased function following a traumatic fall and subsequent ORIF of periprosthetic humerus sfx. A HEP was initiated for ROM and strengthening. Pt will benefit from skilled PT to address impairments to optimize L shoulder/UE.   OBJECTIVE IMPAIRMENTS: decreased activity tolerance, decreased endurance, decreased knowledge of condition, decreased  mobility, decreased ROM, decreased strength, impaired perceived functional ability, impaired UE functional use, improper body mechanics, postural dysfunction, and pain.   ACTIVITY LIMITATIONS: carrying, lifting, sleeping, and reach over head  PERSONAL FACTORS: Age, Fitness, Past/current experiences, and 1 comorbidity: fibromyalgia  are also affecting patient's functional outcome.   REHAB POTENTIAL: Good  CLINICAL DECISION MAKING: Evolving/moderate  complexity  EVALUATION COMPLEXITY: Moderate   GOALS: Goals reviewed with patient? No  SHORT TERM GOALS: Target date: 09/29/2023    Patient to demonstrate independence in HEP  Baseline: Goal status: MET  2.  Increased L shoulder AROM for flexion to 100d and abd to 80d for progression toward functional LTGs Baseline:  Goal status: MET 11/13/2023   LONG TERM GOALS: Target date: 01/08/2024    Patient will score at least 60% on FOTO to signify clinically meaningful improvement in functional abilities.   Baseline: 53% Goal status: PROGRESSING: 55% (11/13/2023)  2.  Pt will be Ind in a final HEP to maintain achieved LOF  Baseline: started Goal status: PROGRESSING 11/13/2023  3.  Increased L shoulder strength to 3+ or greater in order for the pt if lift 2lbs above shoulder height for in home tasks Baseline: see flow sheets Goal status: PROGRESSING 11/13/2023  4.  Increase L shoulder AROM to flexion 125d, abd 90d, and ER to T1 or greater for improved L UE ability for reaching above shoulder, dressing, and hygiene Baseline: see flow sheets 10/19/23: flex=85d Goal status: PROGRESSING 11/13/2023  PLAN:  PT FREQUENCY: 2x/week  PT DURATION: 8 weeks  PLANNED INTERVENTIONS: 97164- PT Re-evaluation, 97110-Therapeutic exercises, 97530- Therapeutic activity, 97112- Neuromuscular re-education, 97535- Self Care, [id number]- Manual therapy, 97014- Electrical stimulation (unattended), Dry Needling, Joint mobilization, Cryotherapy, and Moist heat  PLAN FOR NEXT SESSION: Review FOTO; assess response to HEP; progress therex as indicated; use of modalities, manual therapy; and TPDN as indicated.   Sunnie Odden MS, PT 11/24/23 11:50 AM

## 2023-11-24 ENCOUNTER — Ambulatory Visit: Payer: Medicare Other

## 2023-11-24 DIAGNOSIS — M6281 Muscle weakness (generalized): Secondary | ICD-10-CM

## 2023-11-24 DIAGNOSIS — M25612 Stiffness of left shoulder, not elsewhere classified: Secondary | ICD-10-CM | POA: Diagnosis not present

## 2023-11-28 ENCOUNTER — Ambulatory Visit: Payer: Medicare Other

## 2023-11-29 NOTE — Therapy (Signed)
OUTPATIENT PHYSICAL THERAPY SHOULDER TREATMENT    See note below for Objective Data and Assessment of Progress/Goals.     Patient Name: Tiphany Mceachern MRN: [id number] DOB:1945-11-24, 78 y.o., female Today's Date: 11/30/2023  END OF SESSION:  PT End of Session - 11/30/23 1335     Visit Number 12    Number of Visits 25    Date for PT Re-Evaluation 01/08/24    Authorization Type MEDICARE PART A AND B    Progress Note Due on Visit 20    PT Start Time 1330    PT Stop Time 1412    PT Time Calculation (min) 42 min    Activity Tolerance Patient tolerated treatment well    Behavior During Therapy [facility] for tasks assessed/performed                        Past Medical History:  Diagnosis Date   Anxiety    Arthritis    Blood transfusion without reported diagnosis 05/2019   with hip surgery   Complication of anesthesia    Fibromyalgia    Fractured pelvis (HCC) 09/15/2020   right   GERD (gastroesophageal reflux disease)    GI bleed 5784,6962   x2   Hyperlipidemia    Palpitations    Pneumonia 2005   PONV (postoperative nausea and vomiting)    Pre-diabetes    Stomach ulcer 1990   Past Surgical History:  Procedure Laterality Date   ABDOMINAL HYSTERECTOMY     FRACTURE SURGERY Right 1998   ankle   JOINT REPLACEMENT     LUMBAR FUSION  2015   OPEN REDUCTION INTERNAL FIXATION (ORIF) PROXIMAL PHALANX Right 09/21/2020   Procedure: OPEN TREATMENT OF RIGHT LONG FINGER PROXIMAL PHALANX FRACTURE;  Surgeon: Mack Hook, MD;  Location: [facility] OR;  Service: Orthopedics;  Laterality: Right;  LENGTH OF SURGERY: 75 MIN   ORIF HUMERUS FRACTURE Left    ORIF HUMERUS FRACTURE Left 05/15/2023   Procedure: OPEN REDUCTION INTERNAL FIXATION (ORIF) PERIPROSTHETIC HUMERUS FRACTURE WITH PLATE REMOVAL;  Surgeon: Jones Broom, MD;  Location: [facility] ORS;  Service: Orthopedics;  Laterality: Left;   ORIF TIBIA FRACTURE Right 1998   with bone graft   PYLOROPLASTY     REVERSE SHOULDER  ARTHROPLASTY Left 02/04/2021   Procedure: REVERSE SHOULDER ARTHROPLASTY WITH HARDWARE REMOVAL;  Surgeon: Jones Broom, MD;  Location: [facility] ORS;  Service: Orthopedics;  Laterality: Left;   TOTAL HIP ARTHROPLASTY  2011   TOTAL HIP ARTHROPLASTY Right 05/14/2019   Procedure: Right Anterior Hip Arthroplasty;  Surgeon: Marcene Corning, MD;  Location: [facility] ORS;  Service: Orthopedics;  Laterality: Right;   TOTAL SHOULDER REPLACEMENT  2014   Patient Active Problem List   Diagnosis Date Noted   S/P ORIF (open reduction internal fixation) fracture 05/15/2023   Palpitations 09/01/2020   Chest pain 09/01/2020   Abnormal ECG 09/01/2020   Moderate episode of recurrent major depressive disorder (HCC) 06/22/2020   Prediabetes 06/22/2020   Mixed hyperlipidemia 06/22/2020   History of total right hip arthroplasty 05/17/2019   Anxiety    Primary osteoarthritis of right hip 05/14/2019   Vitamin D deficiency 01/10/2019   Insomnia due to anxiety and fear 10/25/2017   Chronic prescription benzodiazepine use 10/25/2017   Chronic bilateral low back pain without sciatica 04/27/2017   Osteoarthritis 01/06/2017   GAD (generalized anxiety disorder) 01/06/2017   Rheumatoid arthritis involving multiple sites with positive rheumatoid factor (HCC) 12/12/2016   Status post lumbar spinal fusion 12/12/2016  Fibromyalgia 12/12/2016    PCP: Tracey Harries, MD   REFERRING PROVIDER: Jones Broom, MD  REFERRING DIAG: S/p ORIF L periprosthetic Proximal Humerus fracture  THERAPY DIAG:  Stiffness of left shoulder, not elsewhere classified  Muscle weakness (generalized)  Rationale for Evaluation and Treatment: Rehabilitation  ONSET DATE: 05/15/23 Surgery  SUBJECTIVE:                                                                                                                                                                                      SUBJECTIVE STATEMENT: Pt reports she is continuing to complete her HEP  consistently.  EVAL: Pt reports she is currently having significant limitations with the use of her L UE with above shoulder movements. Pt reports being in a sling for 9 weeks since her surgical repair. Her L shoulder replacement was originally completed in 2021. Pt would like to regain the functional use of her L UE for reaching above shoulder, hygiene, and dressing.  Hand dominance: Right  PERTINENT HISTORY: Hospital Course: Adya Effler is an 78 y.o. female who was admitted 05/15/2023 for operative treatment ofS/P ORIF (open reduction internal fixation) fracture. Patient has severe unremitting pain that affects sleep, daily activities, and work/hobbies. After pre-op clearance the patient was taken to the operating room on 05/15/2023 and underwent  Procedure(s): OPEN REDUCTION INTERNAL FIXATION (ORIF) PERIPROSTHETIC HUMERUS FRACTURE WITH PLATE REMOVAL.    PAIN:  Are you having pain? No With active elevation 0/10 lateral GH area at rest. 3/10 with exertion  PRECAUTIONS: Shoulder  RED FLAGS: None   WEIGHT BEARING RESTRICTIONS: No  FALLS:  Has patient fallen in last 6 months? Yes. Number of falls 1  LIVING ENVIRONMENT: Lives with: lives alone Lives in: House/apartment No issue accessing her home or with mobility within  OCCUPATION: retired  PLOF: Independent and Independent with basic ADLs  PATIENT GOALS: To regain use  NEXT MD VISIT:   OBJECTIVE:  Note: Objective measures were completed at Evaluation unless otherwise noted.  DIAGNOSTIC FINDINGS:  CLINICAL DATA:  Surgery   EXAM: LEFT HUMERUS - 2+ VIEW   COMPARISON:  04/27/2023   FINDINGS: Five low resolution intraoperative spot views of the left shoulder. Total fluoroscopy time was 11 seconds, fluoroscopic dose of 0.89 mGy. Left shoulder replacement with additional plate and fixating screws for previously noted proximal humerus periprosthetic fracture   IMPRESSION: Intraoperative fluoroscopic assistance provided  during left shoulder surgery.  PATIENT SURVEYS:  FOTO: Perceived function   53%, predicted   60%  11/13/2023: Current: 55%  POSTURE: Scoliosis c significant lat R shift, increased thoracic kyphosis and forward head  UPPER EXTREMITY ROM:   Active ROM Right eval  Left eval LT 09/28/23 LT 10/10/23 LT 10/12/23 LT LT 11/13/2023  Shoulder flexion A145 A65, AA wall 125, P145 A65 P135 A85 c shrug P140 A 85 c shrug P 160 A 90  Shoulder extension         Shoulder abduction A145 A50, AA wall 90, P130     P110 A80  Shoulder adduction         Shoulder internal rotation A T7 A T7, 90     A t7  Shoulder external rotation A T3 A occiput, 45     A C2  Elbow flexion         Elbow extension         Wrist flexion         Wrist extension         Wrist ulnar deviation         Wrist radial deviation         Wrist pronation         Wrist supination         Active shoulder flex= A 90d; AA 135d. (Blank rows = not tested)  UPPER EXTREMITY MMT:  MMT Right eval Left eval LUE 11/13/2023  Shoulder flexion  2 3 (up to 90)  Shoulder extension  3 4  Shoulder abduction  2 3(up to 80)  Shoulder adduction  3 4  Shoulder internal rotation  3 4  Shoulder external rotation  2 3  Middle trapezius     Lower trapezius     Elbow flexion     Elbow extension     Wrist flexion     Wrist extension     Wrist ulnar deviation     Wrist radial deviation     Wrist pronation     Wrist supination     Grip strength (lbs)     (Blank rows = not tested)  SHOULDER SPECIAL TESTS: deferred  JOINT MOBILITY TESTING:  N/a  PALPATION:  Not TTP   TODAY'S TREATMENT:  [facility] Adult PT Treatment:                                                DATE: 11/30/23 Therapeutic Exercise: Pulley 2 mins for both flexion and scaption AA Wall slides for flexion and scaption x10 each L shoulder flexion 30d inclined 2x 8 L shoulder walls slides 2x10 S/L ER 3# x10; 2# 2x10 S/L abd 3# x10, 2# 2x10 Dynamic stab c small circles 3#  ER  stability c side stepping x8 RTB Shoulder row 3x10 RTB Shoulder ext 3x10 RTB  [facility] Adult PT Treatment:                                                DATE: 11/24/23 Therapeutic Exercise: Pulley 2 mins for both flexion and scaption L shoulder flexion supine 2x10 L shoulder walls slides 2x10 S/L ER 2# 3x10 S/L abd 2# 3x10 Supine serratus press 2# 3x7 ER stability c side stepping x8 RTB  [facility] Adult PT Treatment:  DATE: 11/15/23 Therapeutic Exercise: Pulley 2 mins for both flexion and scaption Counter slide x15 for shoulder flexion Wall slides x10 Supine shoulder flexion and D2 x10 Ball on wall circle, dynam stability S/L shoulder ER 2x10 2# S/L shoulder Abd 2x10 2# Supine shoulder flexion 30d incline 2x8 Standing shoulder ER, partial flexion, partial abd 2x10 YTB each Shoulder row 3x10 RTB Shoulder ext 3x10 RTB  [facility] Adult PT Treatment:                                                DATE: 11/13/2023  Therapeutic Exercise: LUE ranger into flexion and scaption 2x1' MRE in supine Flexion @120   3x8 8s hold  Neuromuscular re-ed: Rhythmic stabailization in supine, shoulder flexion @90  3x1' PNF UE d1/d2 flexion in supine 2x10                                                                     PATIENT EDUCATION: Education details: Discussed eval findings, rehab rationale and POC and patient is in agreement  Person educated: Patient Education method: Explanation Education comprehension: verbalized understanding and needs further education  HOME EXERCISE PROGRAM: Updated HEP Access Code: [email or web address] URL: [email or web address] Date: 11/13/2023 Prepared by: Sheliah Plane  Exercises - Supine Shoulder Flexion with Anchored Resistance  - 1 x daily - 7 x weekly - 3 sets - 6-10 reps - 5s hold - Supine Single Arm Shoulder PNF D1 Flexion  - 1 x daily - 7 x weekly - 2 sets - 6-10 reps - 3 hold - Supine PNF D2 with Resistance  - 1 x  daily - 7 x weekly - 2 sets - 6-10 reps - 3s hold   Initial HEP Access Code: [email or web address] URL: [email or web address] Date: 10/10/2023 Prepared by: Joellyn Rued  Exercises - Supine Shoulder Flexion Extension AAROM with Dowel  - 1-2 x daily - 7 x weekly - 1-2 sets - 10 reps - 3 hold - Supine Shoulder Protraction with Dowel  - 1-2 x daily - 7 x weekly - 1-2 sets - 10 reps - 3 hold - Seated Shoulder Flexion Towel Slide at Table Top Full Range of Motion  - 1-2 x daily - 7 x weekly - 1-2 sets - 10 reps - 3 hold - Standing Single Arm Shoulder Flexion Stretch on Wall  - 1-2 x daily - 7 x weekly - 1-2 sets - 10 reps - 3 hold - Standing Shoulder Abduction Slides at Wall  - 1-2 x daily - 7 x weekly - 1-2 sets - 10 reps - 3 hold - Shoulder External Rotation and Scapular Retraction with Resistance  - 1-2 x daily - 7 x weekly - 1-2 sets - 10 reps - 3 hold - Standing Shoulder Row with Anchored Resistance  - 1-2 x daily - 7 x weekly - 1-2 sets - 10 reps - 3 hold - Sidelying Shoulder Abduction Palm Forward  - 1 x daily - 7 x weekly - 2 sets - 10 reps - Sidelying Shoulder ER with Towel and Dumbbell  - 1 x daily - 7 x weekly - 2 sets - 10 reps  ASSESSMENT:  CLINICAL IMPRESSION: PT was continued for R shoulder strengthening of the RC and periscapular muscles. Pt was able to complete shoulder flexion for the 1st time in an inclined position, 30d. Pt tolerated PT today without adverse effects. Pt will continue to benefit from skilled PT to address impairments for improved L shoulder/UE function.     EVAL: Patient is a 78 y.o. female who was seen today for physical therapy evaluation and treatment for L shoulder loss of motion, decreased strength, and overall decreased function following a traumatic fall and subsequent ORIF of periprosthetic humerus sfx. A HEP was initiated for ROM and strengthening. Pt will benefit from skilled PT to address impairments to optimize L shoulder/UE.   OBJECTIVE  IMPAIRMENTS: decreased activity tolerance, decreased endurance, decreased knowledge of condition, decreased mobility, decreased ROM, decreased strength, impaired perceived functional ability, impaired UE functional use, improper body mechanics, postural dysfunction, and pain.   ACTIVITY LIMITATIONS: carrying, lifting, sleeping, and reach over head  PERSONAL FACTORS: Age, Fitness, Past/current experiences, and 1 comorbidity: fibromyalgia  are also affecting patient's functional outcome.   REHAB POTENTIAL: Good  CLINICAL DECISION MAKING: Evolving/moderate complexity  EVALUATION COMPLEXITY: Moderate   GOALS: Goals reviewed with patient? No  SHORT TERM GOALS: Target date: 09/29/2023    Patient to demonstrate independence in HEP  Baseline: Goal status: MET  2.  Increased L shoulder AROM for flexion to 100d and abd to 80d for progression toward functional LTGs Baseline:  Goal status: MET 11/13/2023   LONG TERM GOALS: Target date: 01/08/2024    Patient will score at least 60% on FOTO to signify clinically meaningful improvement in functional abilities.   Baseline: 53% Goal status: PROGRESSING: 55% (11/13/2023)  2.  Pt will be Ind in a final HEP to maintain achieved LOF  Baseline: started Goal status: PROGRESSING 11/13/2023  3.  Increased L shoulder strength to 3+ or greater in order for the pt if lift 2lbs above shoulder height for in home tasks Baseline: see flow sheets Goal status: PROGRESSING 11/13/2023  4.  Increase L shoulder AROM to flexion 125d, abd 90d, and ER to T1 or greater for improved L UE ability for reaching above shoulder, dressing, and hygiene Baseline: see flow sheets 10/19/23: flex=85d Goal status: PROGRESSING 11/13/2023  PLAN:  PT FREQUENCY: 2x/week  PT DURATION: 8 weeks  PLANNED INTERVENTIONS: 97164- PT Re-evaluation, 97110-Therapeutic exercises, 97530- Therapeutic activity, 97112- Neuromuscular re-education, 97535- Self Care, 42595- Manual therapy, 97014-  Electrical stimulation (unattended), Dry Needling, Joint mobilization, Cryotherapy, and Moist heat  PLAN FOR NEXT SESSION: Review FOTO; assess response to HEP; progress therex as indicated; use of modalities, manual therapy; and TPDN as indicated.   Kebrina Friend MS, PT 11/30/23 2:21 PM

## 2023-11-30 ENCOUNTER — Ambulatory Visit: Payer: Medicare Other

## 2023-11-30 DIAGNOSIS — M6281 Muscle weakness (generalized): Secondary | ICD-10-CM

## 2023-11-30 DIAGNOSIS — M25612 Stiffness of left shoulder, not elsewhere classified: Secondary | ICD-10-CM | POA: Diagnosis not present

## 2023-12-03 NOTE — Therapy (Incomplete)
OUTPATIENT PHYSICAL THERAPY SHOULDER TREATMENT    See note below for Objective Data and Assessment of Progress/Goals.     Patient Name: Tanya Duncan MRN: [id number] DOB:1946-04-26, 78 y.o., female Today's Date: 12/03/2023  END OF SESSION:               Past Medical History:  Diagnosis Date   Anxiety    Arthritis    Blood transfusion without reported diagnosis 05/2019   with hip surgery   Complication of anesthesia    Fibromyalgia    Fractured pelvis (HCC) 09/15/2020   right   GERD (gastroesophageal reflux disease)    GI bleed 2725,3664   x2   Hyperlipidemia    Palpitations    Pneumonia 2005   PONV (postoperative nausea and vomiting)    Pre-diabetes    Stomach ulcer 1990   Past Surgical History:  Procedure Laterality Date   ABDOMINAL HYSTERECTOMY     FRACTURE SURGERY Right 1998   ankle   JOINT REPLACEMENT     LUMBAR FUSION  2015   OPEN REDUCTION INTERNAL FIXATION (ORIF) PROXIMAL PHALANX Right 09/21/2020   Procedure: OPEN TREATMENT OF RIGHT LONG FINGER PROXIMAL PHALANX FRACTURE;  Surgeon: Mack Hook, MD;  Location: [facility] OR;  Service: Orthopedics;  Laterality: Right;  LENGTH OF SURGERY: 75 MIN   ORIF HUMERUS FRACTURE Left    ORIF HUMERUS FRACTURE Left 05/15/2023   Procedure: OPEN REDUCTION INTERNAL FIXATION (ORIF) PERIPROSTHETIC HUMERUS FRACTURE WITH PLATE REMOVAL;  Surgeon: Jones Broom, MD;  Location: [facility] ORS;  Service: Orthopedics;  Laterality: Left;   ORIF TIBIA FRACTURE Right 1998   with bone graft   PYLOROPLASTY     REVERSE SHOULDER ARTHROPLASTY Left 02/04/2021   Procedure: REVERSE SHOULDER ARTHROPLASTY WITH HARDWARE REMOVAL;  Surgeon: Jones Broom, MD;  Location: [facility] ORS;  Service: Orthopedics;  Laterality: Left;   TOTAL HIP ARTHROPLASTY  2011   TOTAL HIP ARTHROPLASTY Right 05/14/2019   Procedure: Right Anterior Hip Arthroplasty;  Surgeon: Marcene Corning, MD;  Location: [facility] ORS;  Service: Orthopedics;  Laterality: Right;   TOTAL SHOULDER  REPLACEMENT  2014   Patient Active Problem List   Diagnosis Date Noted   S/P ORIF (open reduction internal fixation) fracture 05/15/2023   Palpitations 09/01/2020   Chest pain 09/01/2020   Abnormal ECG 09/01/2020   Moderate episode of recurrent major depressive disorder (HCC) 06/22/2020   Prediabetes 06/22/2020   Mixed hyperlipidemia 06/22/2020   History of total right hip arthroplasty 05/17/2019   Anxiety    Primary osteoarthritis of right hip 05/14/2019   Vitamin D deficiency 01/10/2019   Insomnia due to anxiety and fear 10/25/2017   Chronic prescription benzodiazepine use 10/25/2017   Chronic bilateral low back pain without sciatica 04/27/2017   Osteoarthritis 01/06/2017   GAD (generalized anxiety disorder) 01/06/2017   Rheumatoid arthritis involving multiple sites with positive rheumatoid factor (HCC) 12/12/2016   Status post lumbar spinal fusion 12/12/2016   Fibromyalgia 12/12/2016    PCP: Tracey Harries, MD   REFERRING PROVIDER: Jones Broom, MD  REFERRING DIAG: S/p ORIF L periprosthetic Proximal Humerus fracture  THERAPY DIAG:  No diagnosis found.  Rationale for Evaluation and Treatment: Rehabilitation  ONSET DATE: 05/15/23 Surgery  SUBJECTIVE:  SUBJECTIVE STATEMENT: Pt reports she is continuing to complete her HEP consistently.  EVAL: Pt reports she is currently having significant limitations with the use of her L UE with above shoulder movements. Pt reports being in a sling for 9 weeks since her surgical repair. Her L shoulder replacement was originally completed in 2021. Pt would like to regain the functional use of her L UE for reaching above shoulder, hygiene, and dressing.  Hand dominance: Right  PERTINENT HISTORY: Hospital Course: Tanya Duncan is an 78 y.o. female who was  admitted 05/15/2023 for operative treatment ofS/P ORIF (open reduction internal fixation) fracture. Patient has severe unremitting pain that affects sleep, daily activities, and work/hobbies. After pre-op clearance the patient was taken to the operating room on 05/15/2023 and underwent  Procedure(s): OPEN REDUCTION INTERNAL FIXATION (ORIF) PERIPROSTHETIC HUMERUS FRACTURE WITH PLATE REMOVAL.    PAIN:  Are you having pain? No With active elevation 0/10 lateral GH area at rest. 3/10 with exertion  PRECAUTIONS: Shoulder  RED FLAGS: None   WEIGHT BEARING RESTRICTIONS: No  FALLS:  Has patient fallen in last 6 months? Yes. Number of falls 1  LIVING ENVIRONMENT: Lives with: lives alone Lives in: House/apartment No issue accessing her home or with mobility within  OCCUPATION: retired  PLOF: Independent and Independent with basic ADLs  PATIENT GOALS: To regain use  NEXT MD VISIT:   OBJECTIVE:  Note: Objective measures were completed at Evaluation unless otherwise noted.  DIAGNOSTIC FINDINGS:  CLINICAL DATA:  Surgery   EXAM: LEFT HUMERUS - 2+ VIEW   COMPARISON:  04/27/2023   FINDINGS: Five low resolution intraoperative spot views of the left shoulder. Total fluoroscopy time was 11 seconds, fluoroscopic dose of 0.89 mGy. Left shoulder replacement with additional plate and fixating screws for previously noted proximal humerus periprosthetic fracture   IMPRESSION: Intraoperative fluoroscopic assistance provided during left shoulder surgery.  PATIENT SURVEYS:  FOTO: Perceived function   53%, predicted   60%  11/13/2023: Current: 55%  POSTURE: Scoliosis c significant lat R shift, increased thoracic kyphosis and forward head  UPPER EXTREMITY ROM:   Active ROM Right eval Left eval LT 09/28/23 LT 10/10/23 LT 10/12/23 LT LT 11/13/2023  Shoulder flexion A145 A65, AA wall 125, P145 A65 P135 A85 c shrug P140 A 85 c shrug P 160 A 90  Shoulder extension         Shoulder abduction  A145 A50, AA wall 90, P130     P110 A80  Shoulder adduction         Shoulder internal rotation A T7 A T7, 90     A t7  Shoulder external rotation A T3 A occiput, 45     A C2  Elbow flexion         Elbow extension         Wrist flexion         Wrist extension         Wrist ulnar deviation         Wrist radial deviation         Wrist pronation         Wrist supination         Active shoulder flex= A 90d; AA 135d. (Blank rows = not tested)  UPPER EXTREMITY MMT:  MMT Right eval Left eval LUE 11/13/2023  Shoulder flexion  2 3 (up to 90)  Shoulder extension  3 4  Shoulder abduction  2 3(up to 80)  Shoulder adduction  3 4  Shoulder internal rotation  3 4  Shoulder external rotation  2 3  Middle trapezius     Lower trapezius     Elbow flexion     Elbow extension     Wrist flexion     Wrist extension     Wrist ulnar deviation     Wrist radial deviation     Wrist pronation     Wrist supination     Grip strength (lbs)     (Blank rows = not tested)  SHOULDER SPECIAL TESTS: deferred  JOINT MOBILITY TESTING:  N/a  PALPATION:  Not TTP   TODAY'S TREATMENT:  [facility] Adult PT Treatment:                                                DATE: 12/05/23 Therapeutic Exercise: Pulley 2 mins for both flexion and scaption AA Wall slides for flexion and scaption x10 each L shoulder flexion 30d inclined 2x 8 L shoulder walls slides 2x10 S/L ER 3# x10; 2# 2x10 S/L abd 3# x10, 2# 2x10 Dynamic stab c small circles 3#  ER stability c side stepping x8 RTB Shoulder row 3x10 RTB Shoulder ext 3x10 RTB Manual Therapy: *** Neuromuscular re-ed: *** Therapeutic Activity: *** Modalities: *** Self Care: ***  [facility] Adult PT Treatment:                                                DATE: 11/30/23 Therapeutic Exercise: Pulley 2 mins for both flexion and scaption AA Wall slides for flexion and scaption x10 each L shoulder flexion 30d inclined 2x 8 L shoulder walls slides 2x10 S/L ER 3# x10;  2# 2x10 S/L abd 3# x10, 2# 2x10 Dynamic stab c small circles 3#  ER stability c side stepping x8 RTB Shoulder row 3x10 RTB Shoulder ext 3x10 RTB  [facility] Adult PT Treatment:                                                DATE: 11/24/23 Therapeutic Exercise: Pulley 2 mins for both flexion and scaption L shoulder flexion supine 2x10 L shoulder walls slides 2x10 S/L ER 2# 3x10 S/L abd 2# 3x10 Supine serratus press 2# 3x7 ER stability c side stepping x8 RTB                                                                     PATIENT EDUCATION: Education details: Discussed eval findings, rehab rationale and POC and patient is in agreement  Person educated: Patient Education method: Explanation Education comprehension: verbalized understanding and needs further education  HOME EXERCISE PROGRAM: Updated HEP Access Code: [email or web address] URL: [email or web address] Date: 11/13/2023 Prepared by: Sheliah Plane  Exercises - Supine Shoulder Flexion with Anchored Resistance  - 1 x daily - 7 x weekly - 3 sets - 6-10 reps - 5s hold - Supine  Single Arm Shoulder PNF D1 Flexion  - 1 x daily - 7 x weekly - 2 sets - 6-10 reps - 3 hold - Supine PNF D2 with Resistance  - 1 x daily - 7 x weekly - 2 sets - 6-10 reps - 3s hold   Initial HEP Access Code: [email or web address] URL: [email or web address] Date: 10/10/2023 Prepared by: Joellyn Rued  Exercises - Supine Shoulder Flexion Extension AAROM with Dowel  - 1-2 x daily - 7 x weekly - 1-2 sets - 10 reps - 3 hold - Supine Shoulder Protraction with Dowel  - 1-2 x daily - 7 x weekly - 1-2 sets - 10 reps - 3 hold - Seated Shoulder Flexion Towel Slide at Table Top Full Range of Motion  - 1-2 x daily - 7 x weekly - 1-2 sets - 10 reps - 3 hold - Standing Single Arm Shoulder Flexion Stretch on Wall  - 1-2 x daily - 7 x weekly - 1-2 sets - 10 reps - 3 hold - Standing Shoulder Abduction Slides at Wall  - 1-2 x daily - 7 x weekly - 1-2 sets - 10 reps -  3 hold - Shoulder External Rotation and Scapular Retraction with Resistance  - 1-2 x daily - 7 x weekly - 1-2 sets - 10 reps - 3 hold - Standing Shoulder Row with Anchored Resistance  - 1-2 x daily - 7 x weekly - 1-2 sets - 10 reps - 3 hold - Sidelying Shoulder Abduction Palm Forward  - 1 x daily - 7 x weekly - 2 sets - 10 reps - Sidelying Shoulder ER with Towel and Dumbbell  - 1 x daily - 7 x weekly - 2 sets - 10 reps  ASSESSMENT:  CLINICAL IMPRESSION: PT was continued for R shoulder strengthening of the RC and periscapular muscles. Pt was able to complete shoulder flexion for the 1st time in an inclined position, 30d. Pt tolerated PT today without adverse effects. Pt will continue to benefit from skilled PT to address impairments for improved L shoulder/UE function.     EVAL: Patient is a 78 y.o. female who was seen today for physical therapy evaluation and treatment for L shoulder loss of motion, decreased strength, and overall decreased function following a traumatic fall and subsequent ORIF of periprosthetic humerus sfx. A HEP was initiated for ROM and strengthening. Pt will benefit from skilled PT to address impairments to optimize L shoulder/UE.   OBJECTIVE IMPAIRMENTS: decreased activity tolerance, decreased endurance, decreased knowledge of condition, decreased mobility, decreased ROM, decreased strength, impaired perceived functional ability, impaired UE functional use, improper body mechanics, postural dysfunction, and pain.   ACTIVITY LIMITATIONS: carrying, lifting, sleeping, and reach over head  PERSONAL FACTORS: Age, Fitness, Past/current experiences, and 1 comorbidity: fibromyalgia  are also affecting patient's functional outcome.   REHAB POTENTIAL: Good  CLINICAL DECISION MAKING: Evolving/moderate complexity  EVALUATION COMPLEXITY: Moderate   GOALS: Goals reviewed with patient? No  SHORT TERM GOALS: Target date: 09/29/2023    Patient to demonstrate independence in HEP   Baseline: Goal status: MET  2.  Increased L shoulder AROM for flexion to 100d and abd to 80d for progression toward functional LTGs Baseline:  Goal status: MET 11/13/2023   LONG TERM GOALS: Target date: 01/08/2024    Patient will score at least 60% on FOTO to signify clinically meaningful improvement in functional abilities.   Baseline: 53% Goal status: PROGRESSING: 55% (11/13/2023)  2.  Pt will be Ind in a final HEP to maintain  achieved LOF  Baseline: started Goal status: PROGRESSING 11/13/2023  3.  Increased L shoulder strength to 3+ or greater in order for the pt if lift 2lbs above shoulder height for in home tasks Baseline: see flow sheets Goal status: PROGRESSING 11/13/2023  4.  Increase L shoulder AROM to flexion 125d, abd 90d, and ER to T1 or greater for improved L UE ability for reaching above shoulder, dressing, and hygiene Baseline: see flow sheets 10/19/23: flex=85d Goal status: PROGRESSING 11/13/2023  PLAN:  PT FREQUENCY: 2x/week  PT DURATION: 8 weeks  PLANNED INTERVENTIONS: 97164- PT Re-evaluation, 97110-Therapeutic exercises, 97530- Therapeutic activity, 97112- Neuromuscular re-education, 97535- Self Care, 13086- Manual therapy, 97014- Electrical stimulation (unattended), Dry Needling, Joint mobilization, Cryotherapy, and Moist heat  PLAN FOR NEXT SESSION: Review FOTO; assess response to HEP; progress therex as indicated; use of modalities, manual therapy; and TPDN as indicated.   Malijah Lietz MS, PT 12/03/23 10:25 PM

## 2023-12-05 ENCOUNTER — Ambulatory Visit: Payer: Medicare Other

## 2023-12-07 ENCOUNTER — Ambulatory Visit: Payer: Medicare Other

## 2023-12-07 DIAGNOSIS — M25612 Stiffness of left shoulder, not elsewhere classified: Secondary | ICD-10-CM

## 2023-12-07 DIAGNOSIS — M6281 Muscle weakness (generalized): Secondary | ICD-10-CM

## 2023-12-07 NOTE — Therapy (Signed)
OUTPATIENT PHYSICAL THERAPY SHOULDER TREATMENT    See note below for Objective Data and Assessment of Progress/Goals.     Patient Name: Tanya Duncan MRN: [id number] DOB:05/05/1946, 78 y.o., female Today's Date: 12/07/2023  END OF SESSION:  PT End of Session - 12/07/23 1336     Visit Number 13    Number of Visits 25    Date for PT Re-Evaluation 01/08/24    Authorization Type MEDICARE PART A AND B    Progress Note Due on Visit 20    PT Start Time 1332    PT Stop Time 1413    PT Time Calculation (min) 41 min    Activity Tolerance Patient tolerated treatment well    Behavior During Therapy [facility] for tasks assessed/performed                         Past Medical History:  Diagnosis Date   Anxiety    Arthritis    Blood transfusion without reported diagnosis 05/2019   with hip surgery   Complication of anesthesia    Fibromyalgia    Fractured pelvis (HCC) 09/15/2020   right   GERD (gastroesophageal reflux disease)    GI bleed 4098,1191   x2   Hyperlipidemia    Palpitations    Pneumonia 2005   PONV (postoperative nausea and vomiting)    Pre-diabetes    Stomach ulcer 1990   Past Surgical History:  Procedure Laterality Date   ABDOMINAL HYSTERECTOMY     FRACTURE SURGERY Right 1998   ankle   JOINT REPLACEMENT     LUMBAR FUSION  2015   OPEN REDUCTION INTERNAL FIXATION (ORIF) PROXIMAL PHALANX Right 09/21/2020   Procedure: OPEN TREATMENT OF RIGHT LONG FINGER PROXIMAL PHALANX FRACTURE;  Surgeon: Mack Hook, MD;  Location: [facility] OR;  Service: Orthopedics;  Laterality: Right;  LENGTH OF SURGERY: 75 MIN   ORIF HUMERUS FRACTURE Left    ORIF HUMERUS FRACTURE Left 05/15/2023   Procedure: OPEN REDUCTION INTERNAL FIXATION (ORIF) PERIPROSTHETIC HUMERUS FRACTURE WITH PLATE REMOVAL;  Surgeon: Jones Broom, MD;  Location: [facility] ORS;  Service: Orthopedics;  Laterality: Left;   ORIF TIBIA FRACTURE Right 1998   with bone graft   PYLOROPLASTY     REVERSE SHOULDER  ARTHROPLASTY Left 02/04/2021   Procedure: REVERSE SHOULDER ARTHROPLASTY WITH HARDWARE REMOVAL;  Surgeon: Jones Broom, MD;  Location: [facility] ORS;  Service: Orthopedics;  Laterality: Left;   TOTAL HIP ARTHROPLASTY  2011   TOTAL HIP ARTHROPLASTY Right 05/14/2019   Procedure: Right Anterior Hip Arthroplasty;  Surgeon: Marcene Corning, MD;  Location: [facility] ORS;  Service: Orthopedics;  Laterality: Right;   TOTAL SHOULDER REPLACEMENT  2014   Patient Active Problem List   Diagnosis Date Noted   S/P ORIF (open reduction internal fixation) fracture 05/15/2023   Palpitations 09/01/2020   Chest pain 09/01/2020   Abnormal ECG 09/01/2020   Moderate episode of recurrent major depressive disorder (HCC) 06/22/2020   Prediabetes 06/22/2020   Mixed hyperlipidemia 06/22/2020   History of total right hip arthroplasty 05/17/2019   Anxiety    Primary osteoarthritis of right hip 05/14/2019   Vitamin D deficiency 01/10/2019   Insomnia due to anxiety and fear 10/25/2017   Chronic prescription benzodiazepine use 10/25/2017   Chronic bilateral low back pain without sciatica 04/27/2017   Osteoarthritis 01/06/2017   GAD (generalized anxiety disorder) 01/06/2017   Rheumatoid arthritis involving multiple sites with positive rheumatoid factor (HCC) 12/12/2016   Status post lumbar spinal fusion 12/12/2016  Fibromyalgia 12/12/2016    PCP: Tracey Harries, MD   REFERRING PROVIDER: Jones Broom, MD  REFERRING DIAG: S/p ORIF L periprosthetic Proximal Humerus fracture  THERAPY DIAG:  Stiffness of left shoulder, not elsewhere classified  Muscle weakness (generalized)  Rationale for Evaluation and Treatment: Rehabilitation  ONSET DATE: 05/15/23 Surgery  SUBJECTIVE:                                                                                                                                                                                      SUBJECTIVE STATEMENT: Pt reports her back has been hurting her  more.  EVAL: Pt reports she is currently having significant limitations with the use of her L UE with above shoulder movements. Pt reports being in a sling for 9 weeks since her surgical repair. Her L shoulder replacement was originally completed in 2021. Pt would like to regain the functional use of her L UE for reaching above shoulder, hygiene, and dressing.  Hand dominance: Right  PERTINENT HISTORY: Hospital Course: [name] is an 78 y.o. female who was admitted 05/15/2023 for operative treatment ofS/P ORIF (open reduction internal fixation) fracture. Patient has severe unremitting pain that affects sleep, daily activities, and work/hobbies. After pre-op clearance the patient was taken to the operating room on 05/15/2023 and underwent  Procedure(s): OPEN REDUCTION INTERNAL FIXATION (ORIF) PERIPROSTHETIC HUMERUS FRACTURE WITH PLATE REMOVAL.    PAIN:  Are you having pain? No With active elevation 0/10 lateral GH area at rest. 3/10 with exertion  PRECAUTIONS: Shoulder  RED FLAGS: None   WEIGHT BEARING RESTRICTIONS: No  FALLS:  Has patient fallen in last 6 months? Yes. Number of falls 1  LIVING ENVIRONMENT: Lives with: lives alone Lives in: House/apartment No issue accessing her home or with mobility within  OCCUPATION: retired  PLOF: Independent and Independent with basic ADLs  PATIENT GOALS: To regain use  NEXT MD VISIT:   OBJECTIVE:  Note: Objective measures were completed at Evaluation unless otherwise noted.  DIAGNOSTIC FINDINGS:  CLINICAL DATA:  Surgery   EXAM: LEFT HUMERUS - 2+ VIEW   COMPARISON:  04/27/2023   FINDINGS: Five low resolution intraoperative spot views of the left shoulder. Total fluoroscopy time was 11 seconds, fluoroscopic dose of 0.89 mGy. Left shoulder replacement with additional plate and fixating screws for previously noted proximal humerus periprosthetic fracture   IMPRESSION: Intraoperative fluoroscopic assistance provided during  left shoulder surgery.  PATIENT SURVEYS:  FOTO: Perceived function   53%, predicted   60%  11/13/2023: Current: 55%  POSTURE: Scoliosis c significant lat R shift, increased thoracic kyphosis and forward head  UPPER EXTREMITY ROM:   Active ROM Right eval Left  eval LT 09/28/23 LT 10/10/23 LT 10/12/23 LT LT 11/13/2023  Shoulder flexion A145 A65, AA wall 125, P145 A65 P135 A85 c shrug P140 A 85 c shrug P 160 A 90  Shoulder extension         Shoulder abduction A145 A50, AA wall 90, P130     P110 A80  Shoulder adduction         Shoulder internal rotation A T7 A T7, 90     A t7  Shoulder external rotation A T3 A occiput, 45     A C2  Elbow flexion         Elbow extension         Wrist flexion         Wrist extension         Wrist ulnar deviation         Wrist radial deviation         Wrist pronation         Wrist supination         Active shoulder flex= A 90d; AA 135d. (Blank rows = not tested)  UPPER EXTREMITY MMT:  MMT Right eval Left eval LUE 11/13/2023  Shoulder flexion  2 3 (up to 90)  Shoulder extension  3 4  Shoulder abduction  2 3(up to 80)  Shoulder adduction  3 4  Shoulder internal rotation  3 4  Shoulder external rotation  2 3  Middle trapezius     Lower trapezius     Elbow flexion     Elbow extension     Wrist flexion     Wrist extension     Wrist ulnar deviation     Wrist radial deviation     Wrist pronation     Wrist supination     Grip strength (lbs)     (Blank rows = not tested)  SHOULDER SPECIAL TESTS: deferred  JOINT MOBILITY TESTING:  N/a  PALPATION:  Not TTP   TODAY'S TREATMENT:  [facility] Adult PT Treatment:                                                DATE: 12/07/23 Therapeutic Exercise: Pulley 2 mins for both flexion and scaption AA Wall slides for flexion and scaption x10 each L shoulder flexion 30d inclined 2x 8 L shoulder flexion supine x10 S/L ER 3# 2x10; 2# 1x10 S/L abd 3# 2x10, 2# 1x10 Dynamic stab c small circles 3#  Supine  serratus punches 2x8 3# Shoulder row 3x10 GTB  [facility] Adult PT Treatment:                                                DATE: 11/30/23 Therapeutic Exercise: Pulley 2 mins for both flexion and scaption AA Wall slides for flexion and scaption x10 each L shoulder flexion 30d inclined 2x 8 L shoulder walls slides 2x10 S/L ER 3# x10; 2# 2x10 S/L abd 3# x10, 2# 2x10 Dynamic stab c small circles 3#  ER stability c side stepping x8 RTB Shoulder row 3x10 RTB Shoulder ext 3x10 RTB  [facility] Adult PT Treatment:  DATE: 11/24/23 Therapeutic Exercise: Pulley 2 mins for both flexion and scaption L shoulder flexion supine 2x10 L shoulder walls slides 2x10 S/L ER 2# 3x10 S/L abd 2# 3x10 Supine serratus press 2# 3x7 ER stability c side stepping x8 RTB                                                                     PATIENT EDUCATION: Education details: Discussed eval findings, rehab rationale and POC and patient is in agreement  Person educated: Patient Education method: Explanation Education comprehension: verbalized understanding and needs further education  HOME EXERCISE PROGRAM: Updated HEP Access Code: [email or web address] URL: [email or web address] Date: 11/13/2023 Prepared by: Sheliah Plane  Exercises - Supine Shoulder Flexion with Anchored Resistance  - 1 x daily - 7 x weekly - 3 sets - 6-10 reps - 5s hold - Supine Single Arm Shoulder PNF D1 Flexion  - 1 x daily - 7 x weekly - 2 sets - 6-10 reps - 3 hold - Supine PNF D2 with Resistance  - 1 x daily - 7 x weekly - 2 sets - 6-10 reps - 3s hold   Initial HEP Access Code: [email or web address] URL: [email or web address] Date: 10/10/2023 Prepared by: Joellyn Rued  Exercises - Supine Shoulder Flexion Extension AAROM with Dowel  - 1-2 x daily - 7 x weekly - 1-2 sets - 10 reps - 3 hold - Supine Shoulder Protraction with Dowel  - 1-2 x daily - 7 x weekly - 1-2 sets - 10 reps - 3 hold -  Seated Shoulder Flexion Towel Slide at Table Top Full Range of Motion  - 1-2 x daily - 7 x weekly - 1-2 sets - 10 reps - 3 hold - Standing Single Arm Shoulder Flexion Stretch on Wall  - 1-2 x daily - 7 x weekly - 1-2 sets - 10 reps - 3 hold - Standing Shoulder Abduction Slides at Wall  - 1-2 x daily - 7 x weekly - 1-2 sets - 10 reps - 3 hold - Shoulder External Rotation and Scapular Retraction with Resistance  - 1-2 x daily - 7 x weekly - 1-2 sets - 10 reps - 3 hold - Standing Shoulder Row with Anchored Resistance  - 1-2 x daily - 7 x weekly - 1-2 sets - 10 reps - 3 hold - Sidelying Shoulder Abduction Palm Forward  - 1 x daily - 7 x weekly - 2 sets - 10 reps - Sidelying Shoulder ER with Towel and Dumbbell  - 1 x daily - 7 x weekly - 2 sets - 10 reps  ASSESSMENT:  CLINICAL IMPRESSION: PT was continued for R shoulder strengthening of the RC and periscapular muscles. Strength is improving, but needs to make further gains for L shoulder elevation above shoulder height against gravity. Pt tolerated PT today without adverse effects. Pt will continue to benefit from skilled PT to address impairments for improved L shoulder/UE function.     EVAL: Patient is a 78 y.o. female who was seen today for physical therapy evaluation and treatment for L shoulder loss of motion, decreased strength, and overall decreased function following a traumatic fall and subsequent ORIF of periprosthetic humerus sfx. A HEP was initiated for ROM and strengthening. Pt will benefit from skilled PT to  address impairments to optimize L shoulder/UE.   OBJECTIVE IMPAIRMENTS: decreased activity tolerance, decreased endurance, decreased knowledge of condition, decreased mobility, decreased ROM, decreased strength, impaired perceived functional ability, impaired UE functional use, improper body mechanics, postural dysfunction, and pain.   ACTIVITY LIMITATIONS: carrying, lifting, sleeping, and reach over head  PERSONAL FACTORS: Age,  Fitness, Past/current experiences, and 1 comorbidity: fibromyalgia  are also affecting patient's functional outcome.   REHAB POTENTIAL: Good  CLINICAL DECISION MAKING: Evolving/moderate complexity  EVALUATION COMPLEXITY: Moderate   GOALS: Goals reviewed with patient? No  SHORT TERM GOALS: Target date: 09/29/2023    Patient to demonstrate independence in HEP  Baseline: Goal status: MET  2.  Increased L shoulder AROM for flexion to 100d and abd to 80d for progression toward functional LTGs Baseline:  Goal status: MET 11/13/2023   LONG TERM GOALS: Target date: 01/08/2024    Patient will score at least 60% on FOTO to signify clinically meaningful improvement in functional abilities.   Baseline: 53% Goal status: PROGRESSING: 55% (11/13/2023)  2.  Pt will be Ind in a final HEP to maintain achieved LOF  Baseline: started Goal status: PROGRESSING 11/13/2023  3.  Increased L shoulder strength to 3+ or greater in order for the pt if lift 2lbs above shoulder height for in home tasks Baseline: see flow sheets Goal status: PROGRESSING 11/13/2023  4.  Increase L shoulder AROM to flexion 125d, abd 90d, and ER to T1 or greater for improved L UE ability for reaching above shoulder, dressing, and hygiene Baseline: see flow sheets 10/19/23: flex=85d Goal status: PROGRESSING 11/13/2023  PLAN:  PT FREQUENCY: 2x/week  PT DURATION: 8 weeks  PLANNED INTERVENTIONS: 97164- PT Re-evaluation, 97110-Therapeutic exercises, 97530- Therapeutic activity, 97112- Neuromuscular re-education, 97535- Self Care, 16109- Manual therapy, 97014- Electrical stimulation (unattended), Dry Needling, Joint mobilization, Cryotherapy, and Moist heat  PLAN FOR NEXT SESSION: Review FOTO; assess response to HEP; progress therex as indicated; use of modalities, manual therapy; and TPDN as indicated.   Loyce Flaming MS, PT 12/07/23 2:12 PM

## 2023-12-11 NOTE — Therapy (Signed)
 OUTPATIENT PHYSICAL THERAPY SHOULDER TREATMENT    See note below for Objective Data and Assessment of Progress/Goals.     Patient Name: Tanya Duncan MRN: [id number] DOB:Aug 19, 1946, 78 y.o., female Today's Date: 12/12/2023  END OF SESSION:  PT End of Session - 12/12/23 1337     Visit Number 14    Number of Visits 25    Date for PT Re-Evaluation 01/08/24    Authorization Type MEDICARE PART A AND B    PT Start Time 1334    PT Stop Time 1413    PT Time Calculation (min) 39 min    Activity Tolerance Patient tolerated treatment well    Behavior During Therapy Tanya Duncan for tasks assessed/performed                          Past Medical History:  Diagnosis Date   Anxiety    Arthritis    Blood transfusion without reported diagnosis 05/2019   with hip surgery   Complication of anesthesia    Fibromyalgia    Fractured pelvis (HCC) 09/15/2020   right   GERD (gastroesophageal reflux disease)    GI bleed 8111,8009   x2   Hyperlipidemia    Palpitations    Pneumonia 2005   PONV (postoperative nausea and vomiting)    Pre-diabetes    Stomach ulcer 1990   Past Surgical History:  Procedure Laterality Date   ABDOMINAL HYSTERECTOMY     FRACTURE SURGERY Right 1998   ankle   JOINT REPLACEMENT     LUMBAR FUSION  2015   OPEN REDUCTION INTERNAL FIXATION (ORIF) PROXIMAL PHALANX Right 09/21/2020   Procedure: OPEN TREATMENT OF RIGHT LONG FINGER PROXIMAL PHALANX FRACTURE;  Surgeon: Sebastian Lenis, MD;  Location: [facility] OR;  Service: Orthopedics;  Laterality: Right;  LENGTH OF SURGERY: 75 MIN   ORIF HUMERUS FRACTURE Left    ORIF HUMERUS FRACTURE Left 05/15/2023   Procedure: OPEN REDUCTION INTERNAL FIXATION (ORIF) PERIPROSTHETIC HUMERUS FRACTURE WITH PLATE REMOVAL;  Surgeon: Dozier Soulier, MD;  Location: [facility];  Service: Orthopedics;  Laterality: Left;   ORIF TIBIA FRACTURE Right 1998   with bone graft   PYLOROPLASTY     REVERSE SHOULDER ARTHROPLASTY Left 02/04/2021    Procedure: REVERSE SHOULDER ARTHROPLASTY WITH HARDWARE REMOVAL;  Surgeon: Dozier Soulier, MD;  Location: [facility];  Service: Orthopedics;  Laterality: Left;   TOTAL HIP ARTHROPLASTY  2011   TOTAL HIP ARTHROPLASTY Right 05/14/2019   Procedure: Right Anterior Hip Arthroplasty;  Surgeon: Sheril Coy, MD;  Location: [facility];  Service: Orthopedics;  Laterality: Right;   TOTAL SHOULDER REPLACEMENT  2014   Patient Active Problem List   Diagnosis Date Noted   S/P ORIF (open reduction internal fixation) fracture 05/15/2023   Palpitations 09/01/2020   Chest pain 09/01/2020   Abnormal ECG 09/01/2020   Moderate episode of recurrent major depressive disorder (HCC) 06/22/2020   Prediabetes 06/22/2020   Mixed hyperlipidemia 06/22/2020   History of total right hip arthroplasty 05/17/2019   Anxiety    Primary osteoarthritis of right hip 05/14/2019   Vitamin D deficiency 01/10/2019   Insomnia due to anxiety and fear 10/25/2017   Chronic prescription benzodiazepine use 10/25/2017   Chronic bilateral low back pain without sciatica 04/27/2017   Osteoarthritis 01/06/2017   GAD (generalized anxiety disorder) 01/06/2017   Rheumatoid arthritis involving multiple sites with positive rheumatoid factor (HCC) 12/12/2016   Status post lumbar spinal fusion 12/12/2016   Fibromyalgia 12/12/2016    PCP:  [name], MD   REFERRING PROVIDER: Dozier Soulier, MD  REFERRING DIAG: S/p ORIF L periprosthetic Proximal Humerus fracture  THERAPY DIAG:  Stiffness of left shoulder, not elsewhere classified  Muscle weakness (generalized)  Other low back pain  Rationale for Evaluation and Treatment: Rehabilitation  ONSET DATE: 05/15/23 Surgery  SUBJECTIVE:                                                                                                                                                                                      SUBJECTIVE STATEMENT: Pt reports no concerns re; her L shoulder.  EVAL: Pt  reports she is currently having significant limitations with the use of her L UE with above shoulder movements. Pt reports being in a sling for 9 weeks since her surgical repair. Her L shoulder replacement was originally completed in 2021. Pt would like to regain the functional use of her L UE for reaching above shoulder, hygiene, and dressing.  Hand dominance: Right  PERTINENT HISTORY: Hospital Course: Tanya Duncan is an 78 y.o. female who was admitted 05/15/2023 for operative treatment ofS/P ORIF (open reduction internal fixation) fracture. Patient has severe unremitting pain that affects sleep, daily activities, and work/hobbies. After pre-op clearance the patient was taken to the operating room on 05/15/2023 and underwent  Procedure(s): OPEN REDUCTION INTERNAL FIXATION (ORIF) PERIPROSTHETIC HUMERUS FRACTURE WITH PLATE REMOVAL.    PAIN:  Are you having pain? No With active elevation 0/10 lateral GH area at rest. 3/10 with exertion  PRECAUTIONS: Shoulder  RED FLAGS: None   WEIGHT BEARING RESTRICTIONS: No  FALLS:  Has patient fallen in last 6 months? Yes. Number of falls 1  LIVING ENVIRONMENT: Lives with: lives alone Lives in: House/apartment No issue accessing her home or with mobility within  OCCUPATION: retired  PLOF: Independent and Independent with basic ADLs  PATIENT GOALS: To regain use  NEXT MD VISIT:   OBJECTIVE:  Note: Objective measures were completed at Evaluation unless otherwise noted.  DIAGNOSTIC FINDINGS:  CLINICAL DATA:  Surgery   EXAM: LEFT HUMERUS - 2+ VIEW   COMPARISON:  04/27/2023   FINDINGS: Five low resolution intraoperative spot views of the left shoulder. Total fluoroscopy time was 11 seconds, fluoroscopic dose of 0.89 mGy. Left shoulder replacement with additional plate and fixating screws for previously noted proximal humerus periprosthetic fracture   IMPRESSION: Intraoperative fluoroscopic assistance provided during left  shoulder surgery.  PATIENT SURVEYS:  FOTO: Perceived function   53%, predicted   60%  11/13/2023: Current: 55%  POSTURE: Scoliosis c significant lat R shift, increased thoracic kyphosis and forward head  UPPER EXTREMITY ROM:   Active ROM Right eval Left eval LT  09/28/23 LT 10/10/23 LT 10/12/23 LT LT 11/13/2023  Shoulder flexion A145 A65, AA wall 125, P145 A65 P135 A85 c shrug P140 A 85 c shrug P 160 A 90  Shoulder extension         Shoulder abduction A145 A50, AA wall 90, P130     P110 A80  Shoulder adduction         Shoulder internal rotation A T7 A T7, 90     A t7  Shoulder external rotation A T3 A occiput, 45     A C2  Elbow flexion         Elbow extension         Wrist flexion         Wrist extension         Wrist ulnar deviation         Wrist radial deviation         Wrist pronation         Wrist supination         11/26/22: Active shoulder flex= A 90d; AA 135d. (Blank rows = not tested)  UPPER EXTREMITY MMT:  MMT Right eval Left eval LUE 11/13/2023  Shoulder flexion  2 3 (up to 90)  Shoulder extension  3 4  Shoulder abduction  2 3(up to 80)  Shoulder adduction  3 4  Shoulder internal rotation  3 4  Shoulder external rotation  2 3  Middle trapezius     Lower trapezius     Elbow flexion     Elbow extension     Wrist flexion     Wrist extension     Wrist ulnar deviation     Wrist radial deviation     Wrist pronation     Wrist supination     Grip strength (lbs)     (Blank rows = not tested)  SHOULDER SPECIAL TESTS: deferred  JOINT MOBILITY TESTING:  N/a  PALPATION:  Not TTP   TODAY'S TREATMENT:  [facility] Adult PT Treatment:                                                DATE: 12/12/23 Therapeutic Exercise: Pulley 2 mins for both flexion and scaption UBE FWD/BWD 1.5 mins each AA wall slides flexion x10 L shoulder flexion 30d inclined x8 L shoulder flexion supine c 1# 2x10 S/L ER 3# 2x10; 2# 1x10 S/L abd 3# 1x10 thumb up, 1# 2x10 palm down Supine  serratus punches 2x8 3# Therapeutic Activity: UBE FWD/BWD 1.5 mins each Dynamic stab c small circles 3#  ER stability c side stepping x8 RTB  [facility] Adult PT Treatment:                                                DATE: 12/07/23 Therapeutic Exercise: Pulley 2 mins for both flexion and scaption AA Wall slides for flexion and scaption x10 each L shoulder flexion 30d inclined 2x 8 L shoulder flexion supine x10 S/L ER 3# 2x10; 2# 1x10 S/L abd 3# 2x10, 2# 1x10 Dynamic stab c small circles 3#  Supine serratus punches 2x8 3# Shoulder row 3x10 GTB  [facility] Adult PT Treatment:  DATE: 11/30/23 Therapeutic Exercise: Pulley 2 mins for both flexion and scaption AA Wall slides for flexion and scaption x10 each L shoulder flexion 30d inclined 2x 8 L shoulder walls slides 2x10 S/L ER 3# x10; 2# 2x10 S/L abd 3# x10, 2# 2x10 Dynamic stab c small circles 3#  ER stability c side stepping x8 RTB Shoulder row 3x10 RTB Shoulder ext 3x10 RTB                                                                     PATIENT EDUCATION: Education details: Discussed eval findings, rehab rationale and POC and patient is in agreement  Person educated: Patient Education method: Explanation Education comprehension: verbalized understanding and needs further education  HOME EXERCISE PROGRAM: Updated HEP Access Code: [email or web address] URL: [email or web address] Date: 11/13/2023 Prepared by: Mabel Kiang  Exercises - Supine Shoulder Flexion with Anchored Resistance  - 1 x daily - 7 x weekly - 3 sets - 6-10 reps - 5s hold - Supine Single Arm Shoulder PNF D1 Flexion  - 1 x daily - 7 x weekly - 2 sets - 6-10 reps - 3 hold - Supine PNF D2 with Resistance  - 1 x daily - 7 x weekly - 2 sets - 6-10 reps - 3s hold   Initial HEP Access Code: [email or web address] URL: [email or web address] Date: 10/10/2023 Prepared by: Dasie Daft  Exercises - Supine Shoulder  Flexion Extension AAROM with Dowel  - 1-2 x daily - 7 x weekly - 1-2 sets - 10 reps - 3 hold - Supine Shoulder Protraction with Dowel  - 1-2 x daily - 7 x weekly - 1-2 sets - 10 reps - 3 hold - Seated Shoulder Flexion Towel Slide at Table Top Full Range of Motion  - 1-2 x daily - 7 x weekly - 1-2 sets - 10 reps - 3 hold - Standing Single Arm Shoulder Flexion Stretch on Wall  - 1-2 x daily - 7 x weekly - 1-2 sets - 10 reps - 3 hold - Standing Shoulder Abduction Slides at Wall  - 1-2 x daily - 7 x weekly - 1-2 sets - 10 reps - 3 hold - Shoulder External Rotation and Scapular Retraction with Resistance  - 1-2 x daily - 7 x weekly - 1-2 sets - 10 reps - 3 hold - Standing Shoulder Row with Anchored Resistance  - 1-2 x daily - 7 x weekly - 1-2 sets - 10 reps - 3 hold - Sidelying Shoulder Abduction Palm Forward  - 1 x daily - 7 x weekly - 2 sets - 10 reps - Sidelying Shoulder ER with Towel and Dumbbell  - 1 x daily - 7 x weekly - 2 sets - 10 reps  ASSESSMENT:  CLINICAL IMPRESSION: Pt participated in PT for R shoulder strengthening of the RC and periscapular muscles. Pt is still not able to lift the L UE against gravity in standing to her full available L shoulder flexion ROM. Pt starts to shrug her L shoulder at approx 80d. Pt tolerated PT today without adverse effects. Pt will continue to benefit from skilled PT to address strength impairments for improved L shoulder/UE function.     EVAL: Patient is a 78 y.o. female who was seen today for physical therapy  evaluation and treatment for L shoulder loss of motion, decreased strength, and overall decreased function following a traumatic fall and subsequent ORIF of periprosthetic humerus sfx. A HEP was initiated for ROM and strengthening. Pt will benefit from skilled PT to address impairments to optimize L shoulder/UE.   OBJECTIVE IMPAIRMENTS: decreased activity tolerance, decreased endurance, decreased knowledge of condition, decreased mobility, decreased  ROM, decreased strength, impaired perceived functional ability, impaired UE functional use, improper body mechanics, postural dysfunction, and pain.   ACTIVITY LIMITATIONS: carrying, lifting, sleeping, and reach over head  PERSONAL FACTORS: Age, Fitness, Past/current experiences, and 1 comorbidity: fibromyalgia  are also affecting patient's functional outcome.   REHAB POTENTIAL: Good  CLINICAL DECISION MAKING: Evolving/moderate complexity  EVALUATION COMPLEXITY: Moderate   GOALS: Goals reviewed with patient? No  SHORT TERM GOALS: Target date: 09/29/2023    Patient to demonstrate independence in HEP  Baseline: Goal status: MET  2.  Increased L shoulder AROM for flexion to 100d and abd to 80d for progression toward functional LTGs Baseline:  Goal status: MET 11/13/2023   LONG TERM GOALS: Target date: 01/08/2024    Patient will score at least 60% on FOTO to signify clinically meaningful improvement in functional abilities.   Baseline: 53% Goal status: PROGRESSING: 55% (11/13/2023)  2.  Pt will be Ind in a final HEP to maintain achieved LOF  Baseline: started Goal status: PROGRESSING 11/13/2023  3.  Increased L shoulder strength to 3+ or greater in order for the pt if lift 2lbs above shoulder height for in home tasks Baseline: see flow sheets Goal status: PROGRESSING 11/13/2023  4.  Increase L shoulder AROM to flexion 125d, abd 90d, and ER to T1 or greater for improved L UE ability for reaching above shoulder, dressing, and hygiene Baseline: see flow sheets 10/19/23: flex=85d Goal status: PROGRESSING 11/13/2023  PLAN:  PT FREQUENCY: 2x/week  PT DURATION: 8 weeks  PLANNED INTERVENTIONS: 97164- PT Re-evaluation, 97110-Therapeutic exercises, 97530- Therapeutic activity, 97112- Neuromuscular re-education, 97535- Self Care, [id number]- Manual therapy, 97014- Electrical stimulation (unattended), Dry Needling, Joint mobilization, Cryotherapy, and Moist heat  PLAN FOR NEXT SESSION: Review  FOTO; assess response to HEP; progress therex as indicated; use of modalities, manual therapy; and TPDN as indicated.   Adline Kirshenbaum MS, PT 12/12/23 2:17 PM

## 2023-12-12 ENCOUNTER — Ambulatory Visit: Payer: Medicare Other | Attending: Orthopedic Surgery

## 2023-12-12 DIAGNOSIS — M6281 Muscle weakness (generalized): Secondary | ICD-10-CM | POA: Diagnosis present

## 2023-12-12 DIAGNOSIS — M5459 Other low back pain: Secondary | ICD-10-CM | POA: Diagnosis present

## 2023-12-12 DIAGNOSIS — M25612 Stiffness of left shoulder, not elsewhere classified: Secondary | ICD-10-CM | POA: Diagnosis present

## 2023-12-15 ENCOUNTER — Ambulatory Visit: Payer: Medicare Other

## 2023-12-19 ENCOUNTER — Ambulatory Visit: Payer: Medicare Other

## 2024-02-14 NOTE — Therapy (Signed)
 OUTPATIENT PHYSICAL THERAPY THORACOLUMBAR EVALUATION   Patient Name: Tanya Duncan MRN: [id number] DOB:04-May-1946, 78 y.o., female Today's Date: 02/14/2024  END OF SESSION:   Past Medical History:  Diagnosis Date   Anxiety    Arthritis    Blood transfusion without reported diagnosis 05/2019   with hip surgery   Complication of anesthesia    Fibromyalgia    Fractured pelvis (HCC) 09/15/2020   right   GERD (gastroesophageal reflux disease)    GI bleed 4098,1191   x2   Hyperlipidemia    Palpitations    Pneumonia 2005   PONV (postoperative nausea and vomiting)    Pre-diabetes    Stomach ulcer 1990   Past Surgical History:  Procedure Laterality Date   ABDOMINAL HYSTERECTOMY     FRACTURE SURGERY Right 1998   ankle   JOINT REPLACEMENT     LUMBAR FUSION  2015   OPEN REDUCTION INTERNAL FIXATION (ORIF) PROXIMAL PHALANX Right 09/21/2020   Procedure: OPEN TREATMENT OF RIGHT LONG FINGER PROXIMAL PHALANX FRACTURE;  Surgeon: Mack Hook, MD;  Location: [facility] OR;  Service: Orthopedics;  Laterality: Right;  LENGTH OF SURGERY: 75 MIN   ORIF HUMERUS FRACTURE Left    ORIF HUMERUS FRACTURE Left 05/15/2023   Procedure: OPEN REDUCTION INTERNAL FIXATION (ORIF) PERIPROSTHETIC HUMERUS FRACTURE WITH PLATE REMOVAL;  Surgeon: Jones Broom, MD;  Location: [facility];  Service: Orthopedics;  Laterality: Left;   ORIF TIBIA FRACTURE Right 1998   with bone graft   PYLOROPLASTY     REVERSE SHOULDER ARTHROPLASTY Left 02/04/2021   Procedure: REVERSE SHOULDER ARTHROPLASTY WITH HARDWARE REMOVAL;  Surgeon: Jones Broom, MD;  Location: [facility];  Service: Orthopedics;  Laterality: Left;   TOTAL HIP ARTHROPLASTY  2011   TOTAL HIP ARTHROPLASTY Right 05/14/2019   Procedure: Right Anterior Hip Arthroplasty;  Surgeon: Marcene Corning, MD;  Location: [facility];  Service: Orthopedics;  Laterality: Right;   TOTAL SHOULDER REPLACEMENT  2014   Patient Active Problem List   Diagnosis Date Noted   S/P ORIF (open  reduction internal fixation) fracture 05/15/2023   Palpitations 09/01/2020   Chest pain 09/01/2020   Abnormal ECG 09/01/2020   Moderate episode of recurrent major depressive disorder (HCC) 06/22/2020   Prediabetes 06/22/2020   Mixed hyperlipidemia 06/22/2020   History of total right hip arthroplasty 05/17/2019   Anxiety    Primary osteoarthritis of right hip 05/14/2019   Vitamin D deficiency 01/10/2019   Insomnia due to anxiety and fear 10/25/2017   Chronic prescription benzodiazepine use 10/25/2017   Chronic bilateral low back pain without sciatica 04/27/2017   Osteoarthritis 01/06/2017   GAD (generalized anxiety disorder) 01/06/2017   Rheumatoid arthritis involving multiple sites with positive rheumatoid factor (HCC) 12/12/2016   Status post lumbar spinal fusion 12/12/2016   Fibromyalgia 12/12/2016    PCP: Tracey Harries, MD   REFERRING PROVIDER: Tracey Harries, MD  REFERRING DIAG: M43.16 (ICD-10-CM) - Spondylolisthesis, lumbar region   Rationale for Evaluation and Treatment: Rehabilitation  THERAPY DIAG:  No diagnosis found.  ONSET DATE: Chronic- Recently 2 months  SUBJECTIVE:  SUBJECTIVE STATEMENT: ***  PERTINENT HISTORY:  ***  PAIN:  Are you having pain? Yes: NPRS scale: 7/10 c pain medication. Pain range the week prior to PT: 5-8/10 Pain location: low back L>R; R hip pain Pain description: ache, sharp Aggravating factors: walking Relieving factors: pain med, muscle relaxer, Ice pack  PRECAUTIONS: Fall  RED FLAGS: None   WEIGHT BEARING RESTRICTIONS: No  FALLS:  Has patient fallen in last 6 months? Yes. Number of falls 4 Balance issue related to significant lateral postural deficits and always in a hurry LIVING ENVIRONMENT: Lives with: lives alone Lives in:  House/apartment Able to access home  OCCUPATION: retired  PLOF: Independent  PATIENT GOALS: ***  NEXT MD VISIT: ***  OBJECTIVE:  Note: Objective measures were completed at Evaluation unless otherwise noted.  DIAGNOSTIC FINDINGS: CT scan: 02/14/21 CLINICAL DATA:  78 year old who lost her balance earlier today  FINDINGS: CT HEAD FINDINGS   Brain: Mild-to-moderate age related cortical atrophy, especially involving the frontal lobes. Ventricular system normal in size and appearance for age. No mass lesion. No midline shift. No acute hemorrhage or hematoma. No extra-axial fluid collections. No evidence of acute infarction.   Vascular: Mild BILATERAL carotid siphon and LEFT vertebral artery atherosclerosis. No hyperdense vessel.   Skull: No skull fracture or other focal osseous abnormality involving the skull.   Sinuses/Orbits: Small air-fluid level in a POSTERIOR RIGHT ethmoid air cell. Remaining visualized paranasal sinuses, BILATERAL mastoid air cells and BILATERAL middle ear cavities well-aerated. Visualized orbits and globes unremarkable.   Other: LEFT posterior parietal scalp hematoma.   CT CERVICAL SPINE FINDINGS   Alignment: Straightening of the usual lordosis. Grade 1 spondylolisthesis of C3 on C4 measuring 4 mm and grade 1 spondylolisthesis of C4 on C5 measuring 3 mm, both likely degenerative in nature, as there are diffuse facet degenerative changes. The facet joints are anatomically aligned.   Skull base and vertebrae: No fractures identified involving the cervical spine. Coronal reformatted images demonstrate an intact craniocervical junction, intact dens and intact lateral masses throughout.   Soft tissues and spinal canal: No evidence of paraspinous or spinal canal hematoma. No evidence of spinal stenosis.   Disc levels: Disc space narrowing and endplate hypertrophic changes at every level from C3-4 through C7-T1. Facet and uncinate hypertrophy account  for multilevel foraminal stenoses including: Mild RIGHT C2-3, severe BILATERAL C3-4, severe BILATERAL C4-5, severe BILATERAL C5-6 and severe BILATERAL C6-7.   Upper chest: Visualized lung apices clear. Visualized superior mediastinum unremarkable; beam hardening streak artifact from the patient's bilateral shoulder prostheses partially obscures the superior mediastinum.   Other: None.   IMPRESSION: 1. No acute intracranial abnormality. 2. LEFT posterior parietal scalp hematoma without evidence of skull fracture. 3. No cervical spine fractures identified. 4. Grade 1 spondylolisthesis of C3 on C4 and C4 on C5, both likely degenerative in nature. 5. Multilevel degenerative disc disease, spondylosis and facet degenerative changes throughout the cervical spine with multilevel foraminal stenoses as detailed above.  PATIENT SURVEYS:  {rehab surveys:24030}  COGNITION: Overall cognitive status: {cognition:24006}     SENSATION: {sensation:27233}  MUSCLE LENGTH: Hamstrings: Right *** deg; Left *** deg Maisie Fus test: Right *** deg; Left *** deg  POSTURE: {posture:25561}  PALPATION: ***  LUMBAR ROM:   AROM eval  Flexion   Extension   Right lateral flexion   Left lateral flexion   Right rotation   Left rotation    (Blank rows = not tested)  LOWER EXTREMITY ROM:     {AROM/PROM:27142}  Right eval  Left eval  Hip flexion    Hip extension    Hip abduction    Hip adduction    Hip internal rotation    Hip external rotation    Knee flexion    Knee extension    Ankle dorsiflexion    Ankle plantarflexion    Ankle inversion    Ankle eversion     (Blank rows = not tested)  LOWER EXTREMITY MMT:    MMT Right eval Left eval  Hip flexion    Hip extension    Hip abduction    Hip adduction    Hip internal rotation    Hip external rotation    Knee flexion    Knee extension    Ankle dorsiflexion    Ankle plantarflexion    Ankle inversion    Ankle eversion     (Blank  rows = not tested)  LUMBAR SPECIAL TESTS:  {lumbar special test:25242}  FUNCTIONAL TESTS:  {Functional tests:24029}  GAIT: Distance walked: *** Assistive device utilized: {Assistive devices:23999} Level of assistance: {Levels of assistance:24026} Comments: ***  TREATMENT DATE:  [facility] Adult PT Treatment:                                                DATE: 02/15/24 Therapeutic Exercise: *** Manual Therapy: *** Neuromuscular re-ed: *** Therapeutic Activity: *** Modalities: *** Self Care: ***                                                                                                                                  PATIENT EDUCATION:  Education details: *** Person educated: {Person educated:25204} Education method: {Education Method:25205} Education comprehension: {Education Comprehension:25206}  HOME EXERCISE PROGRAM: Access Code: [email or web address] URL: [email or web address] Date: 02/15/2024 Prepared by: Joellyn Rued  Exercises - Supine Lower Trunk Rotation  - 2 x daily - 7 x weekly - 1 sets - 5 reps - 5 hold - Supine Posterior Pelvic Tilt  - 2 x daily - 5 x weekly - 1 sets - 10 reps - 3 hold - Supine March with Resistance Band  - 2 x daily - 7 x weekly - 3 sets - 10 reps - Hooklying Clamshell with Resistance  - 2 x daily - 7 x weekly - 3 sets - 10 reps - 3 hold - Supine 90/90 Alternating Heel Touches with Posterior Pelvic Tilt  - 2 x daily - 5 x weekly - 1 sets - 2 reps - 30s hold - Supine 90/90 Abdominal Bracing  - 2 x daily - 5 x weekly - 1 sets - 2 reps - 30s hold - Hooklying Single Leg Bent Knee Fallouts with Resistance  - 2 x daily - 5 x weekly - 3 sets - 15 reps - Clamshell with Resistance  - 1 x daily - 5 x  weekly - 2 sets - 15 reps  ASSESSMENT:  CLINICAL IMPRESSION: Patient is a 78 y.o. female who was seen today for physical therapy evaluation and treatment for M43.16 (ICD-10-CM) - Spondylolisthesis, lumbar region .   OBJECTIVE IMPAIRMENTS:  {opptimpairments:25111}.   ACTIVITY LIMITATIONS: {activitylimitations:27494}  PARTICIPATION LIMITATIONS: {participationrestrictions:25113}  PERSONAL FACTORS: {Personal factors:25162} are also affecting patient's functional outcome.   REHAB POTENTIAL: {rehabpotential:25112}  CLINICAL DECISION MAKING: {clinical decision making:25114}  EVALUATION COMPLEXITY: {Evaluation complexity:25115}   GOALS:  SHORT TERM GOALS: Target date: ***  *** Baseline: Goal status: INITIAL  2.  *** Baseline:  Goal status: INITIAL  3.  *** Baseline:  Goal status: INITIAL  4.  *** Baseline:  Goal status: INITIAL  5.  *** Baseline:  Goal status: INITIAL  6.  *** Baseline:  Goal status: INITIAL  LONG TERM GOALS: Target date: ***  *** Baseline:  Goal status: INITIAL  2.  *** Baseline:  Goal status: INITIAL  3.  *** Baseline:  Goal status: INITIAL  4.  *** Baseline:  Goal status: INITIAL  5.  *** Baseline:  Goal status: INITIAL  6.  *** Baseline:  Goal status: INITIAL  PLAN:  PT FREQUENCY: {rehab frequency:25116}  PT DURATION: {rehab duration:25117}  PLANNED INTERVENTIONS: {rehab planned interventions:25118::"97110-Therapeutic exercises","97530- Therapeutic [phone]- Neuromuscular re-education","97535- Self JXBJ","47829- Manual therapy"}.  PLAN FOR NEXT SESSION: ***   Joellyn Rued, PT 02/14/2024, 1:39 PM

## 2024-02-15 ENCOUNTER — Ambulatory Visit: Attending: Student

## 2024-02-15 DIAGNOSIS — R262 Difficulty in walking, not elsewhere classified: Secondary | ICD-10-CM | POA: Insufficient documentation

## 2024-02-15 DIAGNOSIS — M5459 Other low back pain: Secondary | ICD-10-CM | POA: Insufficient documentation

## 2024-02-15 DIAGNOSIS — M25551 Pain in right hip: Secondary | ICD-10-CM | POA: Diagnosis present

## 2024-02-15 DIAGNOSIS — M6281 Muscle weakness (generalized): Secondary | ICD-10-CM | POA: Diagnosis present

## 2024-02-15 DIAGNOSIS — R293 Abnormal posture: Secondary | ICD-10-CM | POA: Diagnosis present

## 2024-02-16 ENCOUNTER — Other Ambulatory Visit: Payer: Self-pay

## 2024-02-20 ENCOUNTER — Ambulatory Visit: Admitting: Physical Therapy

## 2024-02-20 ENCOUNTER — Encounter: Payer: Self-pay | Admitting: Physical Therapy

## 2024-02-20 DIAGNOSIS — M5459 Other low back pain: Secondary | ICD-10-CM | POA: Diagnosis not present

## 2024-02-20 DIAGNOSIS — M6281 Muscle weakness (generalized): Secondary | ICD-10-CM

## 2024-02-20 NOTE — Therapy (Signed)
 OUTPATIENT PHYSICAL THERAPY THORACOLUMBAR TREATMENT   Patient Name: Tanya Duncan MRN: [id number] DOB:Feb 05, 1946, 78 y.o., female Today's Date: 02/20/2024  END OF SESSION:  PT End of Session - 02/20/24 1326     Visit Number 2    Number of Visits 17    Date for PT Re-Evaluation 03/29/24    Authorization Type MEDICARE PART A AND B    Progress Note Due on Visit 20    PT Start Time 1325   10 min late   PT Stop Time 1358    PT Time Calculation (min) 33 min             Past Medical History:  Diagnosis Date   Anxiety    Arthritis    Blood transfusion without reported diagnosis 05/2019   with hip surgery   Complication of anesthesia    Fibromyalgia    Fractured pelvis (HCC) 09/15/2020   right   GERD (gastroesophageal reflux disease)    GI bleed 3825,0539   x2   Hyperlipidemia    Palpitations    Pneumonia 2005   PONV (postoperative nausea and vomiting)    Pre-diabetes    Stomach ulcer 1990   Past Surgical History:  Procedure Laterality Date   ABDOMINAL HYSTERECTOMY     FRACTURE SURGERY Right 1998   ankle   JOINT REPLACEMENT     LUMBAR FUSION  2015   OPEN REDUCTION INTERNAL FIXATION (ORIF) PROXIMAL PHALANX Right 09/21/2020   Procedure: OPEN TREATMENT OF RIGHT LONG FINGER PROXIMAL PHALANX FRACTURE;  Surgeon: Rober Chimera, MD;  Location: [facility] OR;  Service: Orthopedics;  Laterality: Right;  LENGTH OF SURGERY: 75 MIN   ORIF HUMERUS FRACTURE Left    ORIF HUMERUS FRACTURE Left 05/15/2023   Procedure: OPEN REDUCTION INTERNAL FIXATION (ORIF) PERIPROSTHETIC HUMERUS FRACTURE WITH PLATE REMOVAL;  Surgeon: Sammye Cristal, MD;  Location: [facility];  Service: Orthopedics;  Laterality: Left;   ORIF TIBIA FRACTURE Right 1998   with bone graft   PYLOROPLASTY     REVERSE SHOULDER ARTHROPLASTY Left 02/04/2021   Procedure: REVERSE SHOULDER ARTHROPLASTY WITH HARDWARE REMOVAL;  Surgeon: Sammye Cristal, MD;  Location: [facility];  Service: Orthopedics;  Laterality: Left;   TOTAL HIP  ARTHROPLASTY  2011   TOTAL HIP ARTHROPLASTY Right 05/14/2019   Procedure: Right Anterior Hip Arthroplasty;  Surgeon: Dayne Even, MD;  Location: [facility];  Service: Orthopedics;  Laterality: Right;   TOTAL SHOULDER REPLACEMENT  2014   Patient Active Problem List   Diagnosis Date Noted   S/P ORIF (open reduction internal fixation) fracture 05/15/2023   Palpitations 09/01/2020   Chest pain 09/01/2020   Abnormal ECG 09/01/2020   Moderate episode of recurrent major depressive disorder (HCC) 06/22/2020   Prediabetes 06/22/2020   Mixed hyperlipidemia 06/22/2020   History of total right hip arthroplasty 05/17/2019   Anxiety    Primary osteoarthritis of right hip 05/14/2019   Vitamin D deficiency 01/10/2019   Insomnia due to anxiety and fear 10/25/2017   Chronic prescription benzodiazepine use 10/25/2017   Chronic bilateral low back pain without sciatica 04/27/2017   Osteoarthritis 01/06/2017   GAD (generalized anxiety disorder) 01/06/2017   Rheumatoid arthritis involving multiple sites with positive rheumatoid factor (HCC) 12/12/2016   Status post lumbar spinal fusion 12/12/2016   Fibromyalgia 12/12/2016    PCP: Alfredia Ina, MD   REFERRING PROVIDER: Alfredia Ina, MD  REFERRING DIAG: M43.16 (ICD-10-CM) - Spondylolisthesis, lumbar region   Rationale for Evaluation and Treatment: Rehabilitation  THERAPY DIAG:  Other low back pain  Muscle weakness (generalized)  ONSET DATE: Chronic- Recently 2 months  SUBJECTIVE:                                                                                                                                                                                           SUBJECTIVE STATEMENT: Pt reports she should have a new referral for hip bursitis.   Pt reports a chronic hx of LBP which has been elevated the past 2 months. She notes she is going to be evaluated for a peripheral nerve stimulator in May, but is hoping PT can give her some relief. She  is additionally experiencing R hip pain and hopes to receive an injection.  PERTINENT HISTORY:  Lumbar fusion 2015; R THA 2020; ORIF L humerus 2024; rTSA 2022  PAIN:  Are you having pain? Yes: NPRS scale: 7/10 c pain medication. Pain range the week prior to PT: 5-8/10 Pain location: low back L>R; R hip pain Pain description: ache, sharp Aggravating factors: walking Relieving factors: pain med, muscle relaxer, Ice pack  PRECAUTIONS: Fall  RED FLAGS: None   WEIGHT BEARING RESTRICTIONS: No  FALLS:  Has patient fallen in last 6 months? Yes. Number of falls 4 Balance issue related to significant lateral shift postural deficits and always being in a hurry  LIVING ENVIRONMENT: Lives with: lives alone Lives in: House/apartment Able to access home  OCCUPATION: retired  PLOF: Independent  PATIENT GOALS: Pain relief  NEXT MD VISIT: May  OBJECTIVE:  Note: Objective measures were completed at Evaluation unless otherwise noted.  DIAGNOSTIC FINDINGS:   See EPIC  PATIENT SURVEYS:  Modified Oswestry 30/50=60%   COGNITION: Overall cognitive status: Within functional limits for tasks assessed     SENSATION: WFL  MUSCLE LENGTH: Hamstrings: Right WNLs deg; Left WNLs deg Maisie Fus test: Right NT deg; Left NT deg  POSTURE: rounded shoulders, forward head, flexed trunk , and scoliosis c R lateral shoulder shift on L lateral pelvis shift , valgus  PALPATION: TTP R sup/post R hip  LUMBAR ROM:   AROM eval  Flexion Min limited  Extension Mod limited  Right lateral flexion Mod Limited  Left lateral flexion Mod limited  Right rotation Full  Left rotation Min limited   (Blank rows = not tested)  LOWER EXTREMITY ROM:     WFLs Active  Right eval Left eval  Hip flexion    Hip extension    Hip abduction    Hip adduction    Hip internal rotation    Hip external rotation    Knee flexion    Knee extension    Ankle dorsiflexion    Ankle  plantarflexion    Ankle inversion     Ankle eversion     (Blank rows = not tested)  LOWER EXTREMITY MMT:    WFLs MMT Right eval Left eval  Hip flexion    Hip extension    Hip abduction    Hip adduction    Hip internal rotation    Hip external rotation    Knee flexion    Knee extension    Ankle dorsiflexion    Ankle plantarflexion    Ankle inversion    Ankle eversion     (Blank rows = not tested)  LUMBAR SPECIAL TESTS:  NT  FUNCTIONAL TESTS:  NT  GAIT: Distance walked: 200' Assistive device utilized: None Level of assistance: Complete Independence Comments: For community ambulation, pt will use a rolator  TREATMENT DATE:  [facility] Adult PT Treatment:                                                DATE: 02/20/24 Therapeutic Exercise: Ppt x 10 PPT + Bridge x 10 Supine GTB clam  Banded Bridge x 10  Supine Marching GTB at thighs  Figure 4 stretch, gluteal Piriformis stretch Supine hamstring stretch with strap  Supine right adductor stretch  LTR 90/90 ab brace 30 sec x 2 - cues to maintain neutral      [facility] Adult PT Treatment:                                                DATE: 02/15/24 Therapeutic Exercise: Developed, instructed in, and pt completed therex as noted in HEP- 1st 4 exs  Self Care: Dscussed use of SPC in R hand to assist with balance and to minimize strain due to her lateral postural shifts which put significant strain on the R side of her back, R hip and R LE                                                                                                                                  PATIENT EDUCATION:  Education details: Eval findings, POC, HEP, self care  Person educated: Patient Education method: Explanation, Demonstration, Tactile cues, Verbal cues, and Handouts Education comprehension: verbalized understanding, returned demonstration, verbal cues required, and tactile cues required  HOME EXERCISE PROGRAM: Access Code: [email or web address] URL: [email or web address] Date:  02/15/2024 Prepared by: Joellyn Rued  Exercises - Supine Lower Trunk Rotation  - 2 x daily - 7 x weekly - 1 sets - 5 reps - 5 hold - Supine Posterior Pelvic Tilt  - 2 x daily - 5 x weekly - 1 sets - 10 reps - 3 hold - Supine March with Resistance Band  -  2 x daily - 7 x weekly - 3 sets - 10 reps - Hooklying Clamshell with Resistance  - 2 x daily - 7 x weekly - 3 sets - 10 reps - 3 hold - Supine 90/90 Alternating Heel Touches with Posterior Pelvic Tilt  - 2 x daily - 5 x weekly - 1 sets - 2 reps - 30s hold - Supine 90/90 Abdominal Bracing  - 2 x daily - 5 x weekly - 1 sets - 2 reps - 30s hold - Hooklying Single Leg Bent Knee Fallouts with Resistance  - 2 x daily - 5 x weekly - 3 sets - 15 reps - Clamshell with Resistance  - 1 x daily - 5 x weekly - 2 sets - 15 reps  ASSESSMENT:  CLINICAL IMPRESSION: Pt reports she should have a referral for Hip bursitis. Instructed pt in hip stretches and discussed her back HEP and how they assist with back and hip strengthening. Continued with review of HEP. Pt has difficulty maintaining neutral in 90/90.    EVAL: Patient is a 78 y.o. female who was seen today for physical therapy evaluation and treatment for M43.16 (ICD-10-CM) - Spondylolisthesis, lumbar region. Pt presents with significant scoliosis with lateral shifts which puts marked strain on her R low back, R hip and R LE. A HEP was initiated to promote lumbopelvic flexibility and strengthening. Pt will benefit from skilled PT to address impairments to optimize back function with less pain.   OBJECTIVE IMPAIRMENTS: decreased activity tolerance, decreased balance, difficulty walking, decreased ROM, decreased strength, postural dysfunction, and pain.   ACTIVITY LIMITATIONS: carrying, lifting, bending, standing, squatting, stairs, and locomotion level  PARTICIPATION LIMITATIONS: meal prep, cleaning, laundry, shopping, and community activity  PERSONAL FACTORS: Fitness, Past/current experiences, and Time  since onset of injury/illness/exacerbation are also affecting patient's functional outcome.   REHAB POTENTIAL: Good  CLINICAL DECISION MAKING: Evolving/moderate complexity  EVALUATION COMPLEXITY: Moderate   GOALS:  SHORT TERM GOALS = LTGs   LONG TERM GOALS: Target date: 03/29/24  Pt will be Ind in a final HEP to maintain achieved LOF  Baseline: started Goal status: INITIAL  2.  Pt will report 50% or greater improvement in low back pain for improved funtion and QOL Baseline: 5-8/10 Goal status: INITIAL  3.  Pt's MODI score will improved to 45% or less as indication of improved function  Baseline: 60% Goal status: INITIAL  4.  Improve 5xSTS by MCID of 5" as indication of improved functional mobility  Baseline: TBA 02/20/24: 10.2 sec Goal status: INITIAL PLAN:  PT FREQUENCY: 2x/week  PT DURATION: 6 weeks  PLANNED INTERVENTIONS: 97164- PT Re-evaluation, 97110-Therapeutic exercises, 97530- Therapeutic activity, 97112- Neuromuscular re-education, 97535- Self Care, 78469- Manual therapy, [phone]- Gait training, [phone]- Aquatic Therapy, [phone]- Electrical stimulation (unattended), Patient/Family education, Balance training, Stair training, Taping, Dry Needling, Cryotherapy, and Moist heat.  PLAN FOR NEXT SESSION: Assess 5xSTS (10.2 sec) , Assess response to HEP; progress therex as indicated; use of modalities, manual therapy; and TPDN as indicated.   Gasper Karst, PTA 02/20/24 3:44 PM Phone: [phone] Fax: [phone]

## 2024-02-22 ENCOUNTER — Ambulatory Visit: Admitting: Physical Therapy

## 2024-02-22 ENCOUNTER — Encounter: Payer: Self-pay | Admitting: Physical Therapy

## 2024-02-22 DIAGNOSIS — M5459 Other low back pain: Secondary | ICD-10-CM

## 2024-02-22 DIAGNOSIS — M6281 Muscle weakness (generalized): Secondary | ICD-10-CM

## 2024-02-22 NOTE — Therapy (Signed)
 OUTPATIENT PHYSICAL THERAPY THORACOLUMBAR TREATMENT   Patient Name: Tanya Duncan MRN: [id number] DOB:31-Oct-1946, 78 y.o., female Today's Date: 02/22/2024  END OF SESSION:  PT End of Session - 02/22/24 1412     Visit Number 3    Number of Visits 17    Date for PT Re-Evaluation 03/29/24    Authorization Type MEDICARE PART A AND B    Progress Note Due on Visit 20    PT Start Time 0204    PT Stop Time 0245    PT Time Calculation (min) 41 min             Past Medical History:  Diagnosis Date   Anxiety    Arthritis    Blood transfusion without reported diagnosis 05/2019   with hip surgery   Complication of anesthesia    Fibromyalgia    Fractured pelvis (HCC) 09/15/2020   right   GERD (gastroesophageal reflux disease)    GI bleed 9147,8295   x2   Hyperlipidemia    Palpitations    Pneumonia 2005   PONV (postoperative nausea and vomiting)    Pre-diabetes    Stomach ulcer 1990   Past Surgical History:  Procedure Laterality Date   ABDOMINAL HYSTERECTOMY     FRACTURE SURGERY Right 1998   ankle   JOINT REPLACEMENT     LUMBAR FUSION  2015   OPEN REDUCTION INTERNAL FIXATION (ORIF) PROXIMAL PHALANX Right 09/21/2020   Procedure: OPEN TREATMENT OF RIGHT LONG FINGER PROXIMAL PHALANX FRACTURE;  Surgeon: Rober Chimera, MD;  Location: [facility] OR;  Service: Orthopedics;  Laterality: Right;  LENGTH OF SURGERY: 75 MIN   ORIF HUMERUS FRACTURE Left    ORIF HUMERUS FRACTURE Left 05/15/2023   Procedure: OPEN REDUCTION INTERNAL FIXATION (ORIF) PERIPROSTHETIC HUMERUS FRACTURE WITH PLATE REMOVAL;  Surgeon: Sammye Cristal, MD;  Location: [facility] ORS;  Service: Orthopedics;  Laterality: Left;   ORIF TIBIA FRACTURE Right 1998   with bone graft   PYLOROPLASTY     REVERSE SHOULDER ARTHROPLASTY Left 02/04/2021   Procedure: REVERSE SHOULDER ARTHROPLASTY WITH HARDWARE REMOVAL;  Surgeon: Sammye Cristal, MD;  Location: [facility];  Service: Orthopedics;  Laterality: Left;   TOTAL HIP ARTHROPLASTY  2011    TOTAL HIP ARTHROPLASTY Right 05/14/2019   Procedure: Right Anterior Hip Arthroplasty;  Surgeon: Dayne Even, MD;  Location: [facility] OR[facility];  Service: Orthopedics;  Laterality: Right;   TOTAL SHOULDER REPLACEMENT  2014   Patient Active Problem List   Diagnosis Date Noted   S/P ORIF (open reduction internal fixation) fracture 05/15/2023   Palpitations 09/01/2020   Chest pain 09/01/2020   Abnormal ECG 09/01/2020   Moderate episode of recurrent major depressive disorder (HCC) 06/22/2020   Prediabetes 06/22/2020   Mixed hyperlipidemia 06/22/2020   History of total right hip arthroplasty 05/17/2019   Anxiety    Primary osteoarthritis of right hip 05/14/2019   Vitamin D deficiency 01/10/2019   Insomnia due to anxiety and fear 10/25/2017   Chronic prescription benzodiazepine use 10/25/2017   Chronic bilateral low back pain without sciatica 04/27/2017   Osteoarthritis 01/06/2017   GAD (generalized anxiety disorder) 01/06/2017   Rheumatoid arthritis involving multiple sites with positive rheumatoid factor (HCC) 12/12/2016   Status post lumbar spinal fusion 12/12/2016   Fibromyalgia 12/12/2016    PCP: Alfredia Ina, MD   REFERRING PROVIDER: Alfredia Ina, MD  REFERRING DIAG: M43.16 (ICD-10-CM) - Spondylolisthesis, lumbar region   Rationale for Evaluation and Treatment: Rehabilitation  THERAPY DIAG:  Other low back pain  Muscle weakness (generalized)  ONSET DATE: Chronic- Recently 2 months  SUBJECTIVE:                                                                                                                                                                                           SUBJECTIVE STATEMENT: Will have a trial of a muscle stimulator on May 5th for my back.   Pt reports she should have a new referral for hip bursitis.   Pt reports a chronic hx of LBP which has been elevated the past 2 months. She notes she is going to be evaluated for a peripheral nerve stimulator in May,  but is hoping PT can give her some relief. She is additionally experiencing R hip pain and hopes to receive an injection.  PERTINENT HISTORY:  Lumbar fusion 2015; R THA 2020; ORIF L humerus 2024; rTSA 2022  PAIN:  Are you having pain? Yes: NPRS scale: 7/10 c pain medication. Pain range the week prior to PT: 5-8/10 Pain location: low back L>R; R hip pain Pain description: ache, sharp Aggravating factors: walking Relieving factors: pain med, muscle relaxer, Ice pack  PRECAUTIONS: Fall  RED FLAGS: None   WEIGHT BEARING RESTRICTIONS: No  FALLS:  Has patient fallen in last 6 months? Yes. Number of falls 4 Balance issue related to significant lateral shift postural deficits and always being in a hurry  LIVING ENVIRONMENT: Lives with: lives alone Lives in: House/apartment Able to access home  OCCUPATION: retired  PLOF: Independent  PATIENT GOALS: Pain relief  NEXT MD VISIT: May  OBJECTIVE:  Note: Objective measures were completed at Evaluation unless otherwise noted.  DIAGNOSTIC FINDINGS:   See EPIC  PATIENT SURVEYS:  Modified Oswestry 30/50=60%   COGNITION: Overall cognitive status: Within functional limits for tasks assessed     SENSATION: WFL  MUSCLE LENGTH: Hamstrings: Right WNLs deg; Left WNLs deg Maisie Fus test: Right NT deg; Left NT deg  POSTURE: rounded shoulders, forward head, flexed trunk , and scoliosis c R lateral shoulder shift on L lateral pelvis shift , valgus  PALPATION: TTP R sup/post R hip  LUMBAR ROM:   AROM eval  Flexion Min limited  Extension Mod limited  Right lateral flexion Mod Limited  Left lateral flexion Mod limited  Right rotation Full  Left rotation Min limited   (Blank rows = not tested)  LOWER EXTREMITY ROM:     WFLs Active  Right eval Left eval  Hip flexion    Hip extension    Hip abduction    Hip adduction    Hip internal rotation    Hip external rotation    Knee flexion  Knee extension    Ankle dorsiflexion     Ankle plantarflexion    Ankle inversion    Ankle eversion     (Blank rows = not tested)  LOWER EXTREMITY MMT:    WFLs MMT Right eval Left eval  Hip flexion    Hip extension    Hip abduction    Hip adduction    Hip internal rotation    Hip external rotation    Knee flexion    Knee extension    Ankle dorsiflexion    Ankle plantarflexion    Ankle inversion    Ankle eversion     (Blank rows = not tested)  LUMBAR SPECIAL TESTS:  NT  FUNCTIONAL TESTS:  NT  GAIT: Distance walked: 200' Assistive device utilized: None Level of assistance: Complete Independence Comments: For community ambulation, pt will use a rolator  TREATMENT DATE:  [facility] Adult PT Treatment:                                                DATE: 02/22/24 Therapeutic Exercise/Therapeutic Activity: Ppt x 10 PPT + Bridge x 10 Supine GTB clam , alternating bent knee fall outs focusing on stabilization Supine Marching  Supine alternating heel slide  Figure 4 stretch, gluteal Piriformis stretch Supine hamstring stretch with strap  LTR  Self Care: Tennis ball for self release to piriformis and gluteals    [facility] Adult PT Treatment:                                                DATE: 02/20/24 Therapeutic Exercise: Ppt x 10 PPT + Bridge x 10 Supine GTB clam  Banded Bridge x 10  Supine Marching GTB at thighs  Figure 4 stretch, gluteal Piriformis stretch Supine hamstring stretch with strap  Supine right adductor stretch  LTR 90/90 ab brace 30 sec x 2 - cues to maintain neutral      [facility] Adult PT Treatment:                                                DATE: 02/15/24 Therapeutic Exercise: Developed, instructed in, and pt completed therex as noted in HEP- 1st 4 exs  Self Care: Dscussed use of SPC in R hand to assist with balance and to minimize strain due to her lateral postural shifts which put significant strain on the R side of her back, R hip and R LE  PATIENT EDUCATION:  Education details: Eval findings, POC, HEP, self care  Person educated: Patient Education method: Explanation, Demonstration, Tactile cues, Verbal cues, and Handouts Education comprehension: verbalized understanding, returned demonstration, verbal cues required, and tactile cues required  HOME EXERCISE PROGRAM: Access Code: [email or web address] URL: [email or web address] Date: 02/15/2024 Prepared by: Liborio Reeds  Exercises - Supine Lower Trunk Rotation  - 2 x daily - 7 x weekly - 1 sets - 5 reps - 5 hold - Supine Posterior Pelvic Tilt  - 2 x daily - 5 x weekly - 1 sets - 10 reps - 3 hold - Supine March with Resistance Band  - 2 x daily - 7 x weekly - 3 sets - 10 reps - Hooklying Clamshell with Resistance  - 2 x daily - 7 x weekly - 3 sets - 10 reps - 3 hold - Supine 90/90 Alternating Heel Touches with Posterior Pelvic Tilt  - 2 x daily - 5 x weekly - 1 sets - 2 reps - 30s hold - Supine 90/90 Abdominal Bracing  - 2 x daily - 5 x weekly - 1 sets - 2 reps - 30s hold - Hooklying Single Leg Bent Knee Fallouts with Resistance  - 2 x daily - 5 x weekly - 3 sets - 15 reps - Clamshell with Resistance  - 1 x daily - 5 x weekly - 2 sets - 15 reps  ASSESSMENT:  CLINICAL IMPRESSION: Pt reports increased pain after last session. Reduced demand of spine stabilization with good tolerance. More focus on low level stabilization and core activation with controlled dynamic movements. Introduced tennis ball for self release of piriformis/ gluteals in supine and seated.     EVAL: Patient is a 78 y.o. female who was seen today for physical therapy evaluation and treatment for M43.16 (ICD-10-CM) - Spondylolisthesis, lumbar region. Pt presents with significant scoliosis with lateral shifts which puts marked strain on her R low back, R hip and R LE. A HEP was initiated to promote lumbopelvic flexibility  and strengthening. Pt will benefit from skilled PT to address impairments to optimize back function with less pain.   OBJECTIVE IMPAIRMENTS: decreased activity tolerance, decreased balance, difficulty walking, decreased ROM, decreased strength, postural dysfunction, and pain.   ACTIVITY LIMITATIONS: carrying, lifting, bending, standing, squatting, stairs, and locomotion level  PARTICIPATION LIMITATIONS: meal prep, cleaning, laundry, shopping, and community activity  PERSONAL FACTORS: Fitness, Past/current experiences, and Time since onset of injury/illness/exacerbation are also affecting patient's functional outcome.   REHAB POTENTIAL: Good  CLINICAL DECISION MAKING: Evolving/moderate complexity  EVALUATION COMPLEXITY: Moderate   GOALS:  SHORT TERM GOALS = LTGs   LONG TERM GOALS: Target date: 03/29/24  Pt will be Ind in a final HEP to maintain achieved LOF  Baseline: started Goal status: INITIAL  2.  Pt will report 50% or greater improvement in low back pain for improved funtion and QOL Baseline: 5-8/10 Goal status: INITIAL  3.  Pt's MODI score will improved to 45% or less as indication of improved function  Baseline: 60% Goal status: INITIAL  4.  Improve 5xSTS by MCID of 5" as indication of improved functional mobility  Baseline: TBA 02/20/24: 10.2 sec Goal status: INITIAL PLAN:  PT FREQUENCY: 2x/week  PT DURATION: 6 weeks  PLANNED INTERVENTIONS: 97164- PT Re-evaluation, 97110-Therapeutic exercises, 97530- Therapeutic activity, 97112- Neuromuscular re-education, 97535- Self Care, 54098- Manual therapy, [phone]- Gait training, [phone]- Aquatic Therapy, [phone]- Electrical stimulation (unattended), Patient/Family education, Balance training, Stair training, Taping, Dry Needling, Cryotherapy, and Moist heat.  PLAN  FOR NEXT SESSION: Assess 5xSTS (10.2 sec) , Assess response to HEP; progress therex as indicated; use of modalities, manual therapy; and TPDN as  indicated.   Gasper Karst, PTA 02/22/24 2:48 PM Phone: [phone] Fax: [phone]

## 2024-02-26 NOTE — Therapy (Signed)
 OUTPATIENT PHYSICAL THERAPY THORACOLUMBAR/LE EVAL and TREATMENT   Patient Name: Tanya Duncan MRN: [id number] DOB:[birth year], 78 y.o., female Today's Date: 02/29/2024  END OF SESSION:  PT End of Session - 02/29/24 0600     Visit Number 4    Number of Visits 17    Date for PT Re-Evaluation 03/29/24    Authorization Type MEDICARE PART A AND B    PT Start Time 0136    PT Stop Time 0220    PT Time Calculation (min) 44 min    Activity Tolerance Patient tolerated treatment well    Behavior During Therapy [facility] for tasks assessed/performed              Past Medical History:  Diagnosis Date   Anxiety    Arthritis    Blood transfusion without reported diagnosis 05/2019   with hip surgery   Complication of anesthesia    Fibromyalgia    Fractured pelvis (HCC) 09/15/2020   right   GERD (gastroesophageal reflux disease)    GI bleed 9528,4132   x2   Hyperlipidemia    Palpitations    Pneumonia 2005   PONV (postoperative nausea and vomiting)    Pre-diabetes    Stomach ulcer 1990   Past Surgical History:  Procedure Laterality Date   ABDOMINAL HYSTERECTOMY     FRACTURE SURGERY Right 1998   ankle   JOINT REPLACEMENT     LUMBAR FUSION  2015   OPEN REDUCTION INTERNAL FIXATION (ORIF) PROXIMAL PHALANX Right 09/21/2020   Procedure: OPEN TREATMENT OF RIGHT LONG FINGER PROXIMAL PHALANX FRACTURE;  Surgeon: Rober Chimera, MD;  Location: [facility] OR;  Service: Orthopedics;  Laterality: Right;  LENGTH OF SURGERY: 75 MIN   ORIF HUMERUS FRACTURE Left    ORIF HUMERUS FRACTURE Left 05/15/2023   Procedure: OPEN REDUCTION INTERNAL FIXATION (ORIF) PERIPROSTHETIC HUMERUS FRACTURE WITH PLATE REMOVAL;  Surgeon: Sammye Cristal, MD;  Location: [facility] ORS;  Service: Orthopedics;  Laterality: Left;   ORIF TIBIA FRACTURE Right 1998   with bone graft   PYLOROPLASTY     REVERSE SHOULDER ARTHROPLASTY Left 02/04/2021   Procedure: REVERSE SHOULDER ARTHROPLASTY WITH HARDWARE REMOVAL;  Surgeon: Sammye Cristal, MD;   Location: [facility] ORS;  Service: Orthopedics;  Laterality: Left;   TOTAL HIP ARTHROPLASTY  2011   TOTAL HIP ARTHROPLASTY Right 05/14/2019   Procedure: Right Anterior Hip Arthroplasty;  Surgeon: Dayne Even, MD;  Location: [facility] ORS;  Service: Orthopedics;  Laterality: Right;   TOTAL SHOULDER REPLACEMENT  2014   Patient Active Problem List   Diagnosis Date Noted   S/P ORIF (open reduction internal fixation) fracture 05/15/2023   Palpitations 09/01/2020   Chest pain 09/01/2020   Abnormal ECG 09/01/2020   Moderate episode of recurrent major depressive disorder (HCC) 06/22/2020   Prediabetes 06/22/2020   Mixed hyperlipidemia 06/22/2020   History of total right hip arthroplasty 05/17/2019   Anxiety    Primary osteoarthritis of right hip 05/14/2019   Vitamin D deficiency 01/10/2019   Insomnia due to anxiety and fear 10/25/2017   Chronic prescription benzodiazepine use 10/25/2017   Chronic bilateral low back pain without sciatica 04/27/2017   Osteoarthritis 01/06/2017   GAD (generalized anxiety disorder) 01/06/2017   Rheumatoid arthritis involving multiple sites with positive rheumatoid factor (HCC) 12/12/2016   Status post lumbar spinal fusion 12/12/2016   Fibromyalgia 12/12/2016    PCP: Alfredia Ina, MD  REFERRING PROVIDER: Alfredia Ina, MD; Dayne Even, MD  REFERRING DIAG: M43.16 (ICD-10-CM) - Spondylolisthesis, lumbar region   Rationale for Evaluation  and Treatment: Rehabilitation  THERAPY DIAG:  Other low back pain - Plan: PT plan of care cert/re-cert  Muscle weakness (generalized) - Plan: PT plan of care cert/re-cert  Abnormal posture - Plan: PT plan of care cert/re-cert  Difficulty in walking, not elsewhere classified - Plan: PT plan of care cert/re-cert  Pain in right hip - Plan: PT plan of care cert/re-cert  ONSET DATE: Chronic- Recently 2 months  SUBJECTIVE:                                                                                                                                                                                            SUBJECTIVE STATEMENT: Pt reports low back is feeling better with the exercises, but her R hip and knee are hurting significantly.  Pt reports a chronic hx of LBP which has been elevated the past 2 months. She notes she is going to be evaluated for a peripheral nerve stimulator in May, but is hoping PT can give her some relief. She is additionally experiencing R hip pain and hopes to receive an injection.  PERTINENT HISTORY:  Lumbar fusion 2015; R THA 2020; ORIF L humerus 2024; rTSA 2022  PAIN:  Are you having pain? Yes: NPRS scale: 5/10 c pain medication. Pain range the week prior to PT: 5-8/10 Pain location: low back L>R; R hip pain Pain description: ache, sharp Aggravating factors: walking Relieving factors: pain med, muscle relaxer, Ice pack  Are you having pain? Yes: NPRS scale: 8/10 c pain medication. Pain range the week prior to PT: 5-8/10 Pain location: R hip pain Pain description: sharp Aggravating factors: walking, lying on it Relieving factors:Ice pack, asper cream with lidocaine   PRECAUTIONS: Fall  RED FLAGS: None   WEIGHT BEARING RESTRICTIONS: No  FALLS:  Has patient fallen in last 6 months? Yes. Number of falls 4 Balance issue related to significant lateral shift postural deficits and always being in a hurry  LIVING ENVIRONMENT: Lives with: lives alone Lives in: House/apartment Able to access home  OCCUPATION: retired  PLOF: Independent  PATIENT GOALS: Pain relief  NEXT MD VISIT: May  OBJECTIVE:  Note: Objective measures were completed at Evaluation unless otherwise noted.  DIAGNOSTIC FINDINGS:   See EPIC  PATIENT SURVEYS:  Modified Oswestry 30/50=60%   COGNITION: Overall cognitive status: Within functional limits for tasks assessed     SENSATION: WFL  MUSCLE LENGTH: Hamstrings: Right WNLs deg; Left WNLs deg Andy Bannister test: Right NT deg; Left NT deg  POSTURE:  rounded shoulders, forward head, flexed trunk , and scoliosis c R lateral shoulder shift on L lateral pelvis shift , valgus  PALPATION: TTP  R sup/post R hip  LUMBAR ROM:   AROM eval  Flexion Min limited  Extension Mod limited  Right lateral flexion Mod Limited  Left lateral flexion Mod limited  Right rotation Full  Left rotation Min limited   (Blank rows = not tested)  LOWER EXTREMITY ROM:     WFLs Active  Right eval Left eval  Hip flexion    Hip extension    Hip abduction    Hip adduction    Hip internal rotation    Hip external rotation    Knee flexion    Knee extension    Ankle dorsiflexion    Ankle plantarflexion    Ankle inversion    Ankle eversion     (Blank rows = not tested)  LOWER EXTREMITY MMT:    WFLs MMT Right eval Left eval  Hip flexion    Hip extension    Hip abduction    Hip adduction    Hip internal rotation    Hip external rotation    Knee flexion    Knee extension    Ankle dorsiflexion    Ankle plantarflexion    Ankle inversion    Ankle eversion     (Blank rows = not tested)  LUMBAR SPECIAL TESTS:  NT  FUNCTIONAL TESTS:  NT  GAIT: Distance walked: 200' Assistive device utilized: None Level of assistance: Complete Independence Comments: For community ambulation, pt will use a rolator  TREATMENT DATE:  [facility] Adult PT Treatment:                                                DATE: 02/28/24 Manual Therapy: STM to the R gluteal and piriformis muscles Skilled palpation to identify Tps and taut muscle bands Trigger Point Dry Needling  Initial Treatment: Pt instructed on Dry Needling rational, procedures, and possible side effects. Pt instructed to expect mild to moderate muscle soreness later in the day and/or into the next day.  Pt instructed in methods to reduce muscle soreness. Pt instructed to continue prescribed HEP. Patient was educated on signs and symptoms of infection and other risk factors and advised to seek medical  attention should they occur.  Patient verbalized understanding of these instructions and education.   Patient Verbal Consent Given: Yes Education Handout Provided: Yes Muscles Treated: R Piriformis, glute min/med/max Electrical Stimulation Performed: No Treatment Response/Outcome: twitch responses   Therapeutic Exercise/Therapeutic Activity: Ppt x 10 Bridge with pball  2x10 Supine GTB clam , alternating bent knee fall outs focusing on stabilization 2x12 Supine Marching  Figure 4 stretch, gluteal Piriformis stretch Supine hamstring stretch with strap  LTR Modalities: Intophoresis dexamethasone  4mg /ml for 6 hours Precaution not to get the pad wet and regarding pontential skin irritation from the adhesive of the patch  [facility] Adult PT Treatment:                                                DATE: 02/22/24 Therapeutic Exercise/Therapeutic Activity: Ppt x 10 PPT + Bridge x 10 Supine GTB clam , alternating bent knee fall outs focusing on stabilization Supine Marching  Supine alternating heel slide  Figure 4 stretch, gluteal Piriformis stretch Supine hamstring stretch with strap  LTR  Self Care: Tennis  ball for self release to piriformis and gluteals    [facility] Adult PT Treatment:                                                DATE: 02/20/24 Therapeutic Exercise: Ppt x 10 PPT + Bridge x 10 Supine GTB clam  Banded Bridge x 10  Supine Marching GTB at thighs  Figure 4 stretch, gluteal Piriformis stretch Supine hamstring stretch with strap  Supine right adductor stretch  LTR 90/90 ab brace 30 sec x 2 - cues to maintain neutral                                                                                                                                  PATIENT EDUCATION:  Education details: Eval findings, POC, HEP, self care  Person educated: Patient Education method: Explanation, Demonstration, Tactile cues, Verbal cues, and Handouts Education comprehension: verbalized  understanding, returned demonstration, verbal cues required, and tactile cues required  HOME EXERCISE PROGRAM: Access Code: [email or web address] URL: [email or web address] Date: 02/15/2024 Prepared by: Liborio Reeds  Exercises - Supine Lower Trunk Rotation  - 2 x daily - 7 x weekly - 1 sets - 5 reps - 5 hold - Supine Posterior Pelvic Tilt  - 2 x daily - 5 x weekly - 1 sets - 10 reps - 3 hold - Supine March with Resistance Band  - 2 x daily - 7 x weekly - 3 sets - 10 reps - Hooklying Clamshell with Resistance  - 2 x daily - 7 x weekly - 3 sets - 10 reps - 3 hold - Supine 90/90 Alternating Heel Touches with Posterior Pelvic Tilt  - 2 x daily - 5 x weekly - 1 sets - 2 reps - 30s hold - Supine 90/90 Abdominal Bracing  - 2 x daily - 5 x weekly - 1 sets - 2 reps - 30s hold - Hooklying Single Leg Bent Knee Fallouts with Resistance  - 2 x daily - 5 x weekly - 3 sets - 15 reps - Clamshell with Resistance  - 1 x daily - 5 x weekly - 2 sets - 15 reps  ASSESSMENT:  CLINICAL IMPRESSION: A new referral for R greater trochanteric bursitsis was received. PT today was provided for STM to the R gluteal and piriformis muscles f/b TPDN to theses muscles with muscle twitch responses elicited. Exercises for R hip flexibility and strengthening were then completed for muscle activation. Iontophoresis was then applied to the R lateral greater trochanter region to address pain. Pt tolerated PT today without adverse effects. Pt will continue to benefit from skilled PT to address impairments for improved function.   EVAL: Patient is a 78 y.o. female who was seen today for physical therapy evaluation and treatment for M43.16 (ICD-10-CM) - Spondylolisthesis, lumbar region.  Pt presents with significant scoliosis with lateral shifts which puts marked strain on her R low back, R hip and R LE. A HEP was initiated to promote lumbopelvic flexibility and strengthening. Pt will benefit from skilled PT to address impairments to  optimize back function with less pain.   OBJECTIVE IMPAIRMENTS: decreased activity tolerance, decreased balance, difficulty walking, decreased ROM, decreased strength, postural dysfunction, and pain.   ACTIVITY LIMITATIONS: carrying, lifting, bending, standing, squatting, stairs, and locomotion level  PARTICIPATION LIMITATIONS: meal prep, cleaning, laundry, shopping, and community activity  PERSONAL FACTORS: Fitness, Past/current experiences, and Time since onset of injury/illness/exacerbation are also affecting patient's functional outcome.   REHAB POTENTIAL: Good  CLINICAL DECISION MAKING: Evolving/moderate complexity  EVALUATION COMPLEXITY: Moderate   GOALS:  SHORT TERM GOALS = LTGs   LONG TERM GOALS: Target date: 03/29/24  Pt will be Ind in a final HEP to maintain achieved LOF  Baseline: started Goal status: INITIAL  2.  Pt will report 50% or greater improvement in low back pain for improved funtion and QOL Baseline: 5-8/10 Goal status: INITIAL  3.  Pt's MODI score will improved to 45% or less as indication of improved function  Baseline: 60% Goal status: INITIAL  4.  Improve 5xSTS by MCID of 5" as indication of improved functional mobility  Baseline: TBA 02/20/24: 10.2 sec Goal status: INITIAL  5. Pt will report 50% or greater improvement in her R hip pain for improved function and QOL Baseline: 8/10 Goal status: INITIAL  PLAN:  PT FREQUENCY: 2x/week  PT DURATION: 6 weeks  PLANNED INTERVENTIONS: 97164- PT Re-evaluation, 97110-Therapeutic exercises, 97530- Therapeutic activity, 97112- Neuromuscular re-education, 97535- Self Care, 47829- Manual therapy, [id number]- Gait training, [phone]- Aquatic Therapy, [phone]- Electrical stimulation (unattended), Patient/Family education, Balance training, Stair training, Taping, Dry Needling, Cryotherapy, and Moist heat.  PLAN FOR NEXT SESSION: Assess 5xSTS (10.2 sec) , Assess response to HEP; progress therex as indicated; use of  modalities, manual therapy; and TPDN as indicated.  Roselyn Doby MS, PT 02/29/24 6:28 AM

## 2024-02-28 ENCOUNTER — Ambulatory Visit

## 2024-02-28 DIAGNOSIS — R262 Difficulty in walking, not elsewhere classified: Secondary | ICD-10-CM

## 2024-02-28 DIAGNOSIS — M25551 Pain in right hip: Secondary | ICD-10-CM

## 2024-02-28 DIAGNOSIS — M5459 Other low back pain: Secondary | ICD-10-CM

## 2024-02-28 DIAGNOSIS — M6281 Muscle weakness (generalized): Secondary | ICD-10-CM

## 2024-02-28 DIAGNOSIS — R293 Abnormal posture: Secondary | ICD-10-CM

## 2024-02-28 NOTE — Patient Instructions (Signed)

## 2024-03-01 ENCOUNTER — Ambulatory Visit

## 2024-03-01 DIAGNOSIS — M5459 Other low back pain: Secondary | ICD-10-CM | POA: Diagnosis not present

## 2024-03-01 DIAGNOSIS — M25551 Pain in right hip: Secondary | ICD-10-CM

## 2024-03-01 DIAGNOSIS — R293 Abnormal posture: Secondary | ICD-10-CM

## 2024-03-01 DIAGNOSIS — M6281 Muscle weakness (generalized): Secondary | ICD-10-CM

## 2024-03-01 DIAGNOSIS — R262 Difficulty in walking, not elsewhere classified: Secondary | ICD-10-CM

## 2024-03-01 NOTE — Therapy (Signed)
 OUTPATIENT PHYSICAL THERAPY THORACOLUMBAR/LE EVAL and TREATMENT   Patient Name: Tanya Duncan MRN: [id number] DOB:[birth year], 78 y.o., female Today's Date: 03/01/2024  END OF SESSION:  PT End of Session - 03/01/24 1329     Visit Number 5    Number of Visits 17    Date for PT Re-Evaluation 03/29/24    Authorization Type MEDICARE PART A AND B    PT Start Time 0120    PT Stop Time 0205    PT Time Calculation (min) 45 min    Activity Tolerance Patient tolerated treatment well    Behavior During Therapy [facility] for tasks assessed/performed               Past Medical History:  Diagnosis Date   Anxiety    Arthritis    Blood transfusion without reported diagnosis 05/2019   with hip surgery   Complication of anesthesia    Fibromyalgia    Fractured pelvis (HCC) 09/15/2020   right   GERD (gastroesophageal reflux disease)    GI bleed 8295,6213   x2   Hyperlipidemia    Palpitations    Pneumonia 2005   PONV (postoperative nausea and vomiting)    Pre-diabetes    Stomach ulcer 1990   Past Surgical History:  Procedure Laterality Date   ABDOMINAL HYSTERECTOMY     FRACTURE SURGERY Right 1998   ankle   JOINT REPLACEMENT     LUMBAR FUSION  2015   OPEN REDUCTION INTERNAL FIXATION (ORIF) PROXIMAL PHALANX Right 09/21/2020   Procedure: OPEN TREATMENT OF RIGHT LONG FINGER PROXIMAL PHALANX FRACTURE;  Surgeon: Rober Chimera, MD;  Location: [facility] OR;  Service: Orthopedics;  Laterality: Right;  LENGTH OF SURGERY: 75 MIN   ORIF HUMERUS FRACTURE Left    ORIF HUMERUS FRACTURE Left 05/15/2023   Procedure: OPEN REDUCTION INTERNAL FIXATION (ORIF) PERIPROSTHETIC HUMERUS FRACTURE WITH PLATE REMOVAL;  Surgeon: Sammye Cristal, MD;  Location: [facility] ORS;  Service: Orthopedics;  Laterality: Left;   ORIF TIBIA FRACTURE Right 1998   with bone graft   PYLOROPLASTY     REVERSE SHOULDER ARTHROPLASTY Left 02/04/2021   Procedure: REVERSE SHOULDER ARTHROPLASTY WITH HARDWARE REMOVAL;  Surgeon: Sammye Cristal,  MD;  Location: [facility] ORS;  Service: Orthopedics;  Laterality: Left;   TOTAL HIP ARTHROPLASTY  2011   TOTAL HIP ARTHROPLASTY Right 05/14/2019   Procedure: Right Anterior Hip Arthroplasty;  Surgeon: Dayne Even, MD;  Location: [facility] ORS;  Service: Orthopedics;  Laterality: Right;   TOTAL SHOULDER REPLACEMENT  2014   Patient Active Problem List   Diagnosis Date Noted   S/P ORIF (open reduction internal fixation) fracture 05/15/2023   Palpitations 09/01/2020   Chest pain 09/01/2020   Abnormal ECG 09/01/2020   Moderate episode of recurrent major depressive disorder (HCC) 06/22/2020   Prediabetes 06/22/2020   Mixed hyperlipidemia 06/22/2020   History of total right hip arthroplasty 05/17/2019   Anxiety    Primary osteoarthritis of right hip 05/14/2019   Vitamin D deficiency 01/10/2019   Insomnia due to anxiety and fear 10/25/2017   Chronic prescription benzodiazepine use 10/25/2017   Chronic bilateral low back pain without sciatica 04/27/2017   Osteoarthritis 01/06/2017   GAD (generalized anxiety disorder) 01/06/2017   Rheumatoid arthritis involving multiple sites with positive rheumatoid factor (HCC) 12/12/2016   Status post lumbar spinal fusion 12/12/2016   Fibromyalgia 12/12/2016    PCP: Alfredia Ina, MD  REFERRING PROVIDER: Alfredia Ina, MD; Dayne Even, MD  REFERRING DIAG: M43.16 (ICD-10-CM) - Spondylolisthesis, lumbar region   Rationale for  Evaluation and Treatment: Rehabilitation  THERAPY DIAG:  Other low back pain  Muscle weakness (generalized)  Abnormal posture  Difficulty in walking, not elsewhere classified  Pain in right hip  ONSET DATE: Chronic- Recently 2 months  SUBJECTIVE:                                                                                                                                                                                           SUBJECTIVE STATEMENT: Pt reports her R hip was much better the day of treatment (Wed) and  yesterday. Today it is back to the usual level. Pt reports her low back continues to fel consistently better.  Pt reports a chronic hx of LBP which has been elevated the past 2 months. She notes she is going to be evaluated for a peripheral nerve stimulator in May, but is hoping PT can give her some relief. She is additionally experiencing R hip pain and hopes to receive an injection.  PERTINENT HISTORY:  Lumbar fusion 2015; R THA 2020; ORIF L humerus 2024; rTSA 2022  PAIN:  Are you having pain? Yes: NPRS scale: 5/10 c pain medication. Pain range the week prior to PT: 5-8/10 Pain location: low back L>R; R hip pain Pain description: ache, sharp Aggravating factors: walking Relieving factors: pain med, muscle relaxer, Ice pack  Are you having pain? Yes: NPRS scale: 8/10 c pain medication. Pain range the week prior to PT: 5-8/10 Pain location: R hip pain Pain description: sharp Aggravating factors: walking, lying on it Relieving factors:Ice pack, asper cream with lidocaine   PRECAUTIONS: Fall  RED FLAGS: None   WEIGHT BEARING RESTRICTIONS: No  FALLS:  Has patient fallen in last 6 months? Yes. Number of falls 4 Balance issue related to significant lateral shift postural deficits and always being in a hurry  LIVING ENVIRONMENT: Lives with: lives alone Lives in: House/apartment Able to access home  OCCUPATION: retired  PLOF: Independent  PATIENT GOALS: Pain relief  NEXT MD VISIT: May  OBJECTIVE:  Note: Objective measures were completed at Evaluation unless otherwise noted.  DIAGNOSTIC FINDINGS:   See EPIC  PATIENT SURVEYS:  Modified Oswestry 30/50=60%   COGNITION: Overall cognitive status: Within functional limits for tasks assessed     SENSATION: WFL  MUSCLE LENGTH: Hamstrings: Right WNLs deg; Left WNLs deg Andy Bannister test: Right NT deg; Left NT deg  POSTURE: rounded shoulders, forward head, flexed trunk , and scoliosis c R lateral shoulder shift on L lateral  pelvis shift , valgus  PALPATION: TTP R sup/post R hip  LUMBAR ROM:   AROM eval  Flexion Min limited  Extension Mod limited  Right lateral flexion Mod Limited  Left lateral flexion Mod limited  Right rotation Full  Left rotation Min limited   (Blank rows = not tested)  LOWER EXTREMITY ROM:     WFLs Active  Right eval Left eval  Hip flexion    Hip extension    Hip abduction    Hip adduction    Hip internal rotation    Hip external rotation    Knee flexion    Knee extension    Ankle dorsiflexion    Ankle plantarflexion    Ankle inversion    Ankle eversion     (Blank rows = not tested)  LOWER EXTREMITY MMT:    WFLs MMT Right eval Left eval  Hip flexion    Hip extension    Hip abduction    Hip adduction    Hip internal rotation    Hip external rotation    Knee flexion    Knee extension    Ankle dorsiflexion    Ankle plantarflexion    Ankle inversion    Ankle eversion     (Blank rows = not tested)  LUMBAR SPECIAL TESTS:  NT  FUNCTIONAL TESTS:  NT  GAIT: Distance walked: 200' Assistive device utilized: None Level of assistance: Complete Independence Comments: For community ambulation, pt will use a rolator  TREATMENT DATE:  [facility] Adult PT Treatment:                                                DATE: 03/01/24 Manual Therapy: STM to the TFL and ASIS tendons LAD to the R hip c small oscillations Therapeutic Exercise/Therapeutic Activity: PPT x 10 Bridge with pball  2x10 Supine GTB clam, alternating bent knee fall outs focusing on stabilization 2x12 Supine Marching  Figure 4 stretch x2 20" Piriformis stretch x2 20" Supine hamstring stretch with strap  LTR Supine hip flexor stretch x2 30" each Modalities: Intophoresis dexamethasone  4mg /ml for 6 hours Precautions: not to get the pad wet and regarding pontential skin irritation from the adhesive of the patch  [facility] Adult PT Treatment:                                                DATE:  02/28/24 Manual Therapy: STM to the R gluteal and piriformis muscles Skilled palpation to identify Tps and taut muscle bands Trigger Point Dry Needling  Initial Treatment: Pt instructed on Dry Needling rational, procedures, and possible side effects. Pt instructed to expect mild to moderate muscle soreness later in the day and/or into the next day.  Pt instructed in methods to reduce muscle soreness. Pt instructed to continue prescribed HEP. Patient was educated on signs and symptoms of infection and other risk factors and advised to seek medical attention should they occur.  Patient verbalized understanding of these instructions and education.   Patient Verbal Consent Given: Yes Education Handout Provided: Yes Muscles Treated: R Piriformis, glute min/med/max Electrical Stimulation Performed: No Treatment Response/Outcome: twitch responses   Therapeutic Exercise/Therapeutic Activity: PPT x 10 Bridge with pball  2x10 Supine GTB clam , alternating bent knee fall outs focusing on stabilization x12 Supine Marching x20 Figure 4 stretch, gluteal x2 20" Piriformis stretch x2 20" Supine hamstring stretch with strap  LTR  Modalities: Intophoresis dexamethasone  4mg /ml for 6 hours Precaution not to get the pad wet and regarding pontential skin irritation from the adhesive of the patch                                                                                                                     PATIENT EDUCATION:  Education details: Eval findings, POC, HEP, self care  Person educated: Patient Education method: Explanation, Demonstration, Tactile cues, Verbal cues, and Handouts Education comprehension: verbalized understanding, returned demonstration, verbal cues required, and tactile cues required  HOME EXERCISE PROGRAM: Access Code: [email or web address] URL: [email or web address] Date: 02/15/2024 Prepared by: Liborio Reeds  Exercises - Supine Lower Trunk Rotation  - 2 x daily -  7 x weekly - 1 sets - 5 reps - 5 hold - Supine Posterior Pelvic Tilt  - 2 x daily - 5 x weekly - 1 sets - 10 reps - 3 hold - Supine March with Resistance Band  - 2 x daily - 7 x weekly - 3 sets - 10 reps - Hooklying Clamshell with Resistance  - 2 x daily - 7 x weekly - 3 sets - 10 reps - 3 hold - Supine 90/90 Alternating Heel Touches with Posterior Pelvic Tilt  - 2 x daily - 5 x weekly - 1 sets - 2 reps - 30s hold - Supine 90/90 Abdominal Bracing  - 2 x daily - 5 x weekly - 1 sets - 2 reps - 30s hold - Hooklying Single Leg Bent Knee Fallouts with Resistance  - 2 x daily - 5 x weekly - 3 sets - 15 reps - Clamshell with Resistance  - 1 x daily - 5 x weekly - 2 sets - 15 reps  ASSESSMENT:  CLINICAL IMPRESSION: Pt had a very good temporary relief of pain (2 days) after the last PT session. Manual therapy was completed for STM to the ant hip and LAD of the R hip. PT was continued for exercises for R hip flexibility and strengthening with mod Thomas stretches performed to stretch the hip flexors. Iontophoresis was then applied to the R lateral greater trochanter region to address pain. Pt tolerated PT today without adverse effects. Pt will continue to benefit from skilled PT to address impairments for improved function.   EVAL: Patient is a 78 y.o. female who was seen today for physical therapy evaluation and treatment for M43.16 (ICD-10-CM) - Spondylolisthesis, lumbar region. Pt presents with significant scoliosis with lateral shifts which puts marked strain on her R low back, R hip and R LE. A HEP was initiated to promote lumbopelvic flexibility and strengthening. Pt will benefit from skilled PT to address impairments to optimize back function with less pain.   OBJECTIVE IMPAIRMENTS: decreased activity tolerance, decreased balance, difficulty walking, decreased ROM, decreased strength, postural dysfunction, and pain.   ACTIVITY LIMITATIONS: carrying, lifting, bending, standing, squatting, stairs, and  locomotion level  PARTICIPATION LIMITATIONS: meal prep, cleaning, laundry, shopping, and community activity  PERSONAL FACTORS: Fitness, Past/current experiences,  and Time since onset of injury/illness/exacerbation are also affecting patient's functional outcome.   REHAB POTENTIAL: Good  CLINICAL DECISION MAKING: Evolving/moderate complexity  EVALUATION COMPLEXITY: Moderate   GOALS:  SHORT TERM GOALS = LTGs   LONG TERM GOALS: Target date: 03/29/24  Pt will be Ind in a final HEP to maintain achieved LOF  Baseline: started Goal status: INITIAL  2.  Pt will report 50% or greater improvement in low back pain for improved funtion and QOL Baseline: 5-8/10 Goal status: INITIAL  3.  Pt's MODI score will improved to 45% or less as indication of improved function  Baseline: 60% Goal status: INITIAL  4.  Improve 5xSTS by MCID of 5" as indication of improved functional mobility  Baseline: TBA 02/20/24: 10.2 sec Goal status: INITIAL  5. Pt will report 50% or greater improvement in her R hip pain for improved function and QOL Baseline: 8/10 Goal status: INITIAL  PLAN:  PT FREQUENCY: 2x/week  PT DURATION: 6 weeks  PLANNED INTERVENTIONS: 97164- PT Re-evaluation, 97110-Therapeutic exercises, 97530- Therapeutic activity, 97112- Neuromuscular re-education, 97535- Self Care, 16109- Manual therapy, [id number]- Gait training, [phone]- Aquatic Therapy, [phone]- Electrical stimulation (unattended), Patient/Family education, Balance training, Stair training, Taping, Dry Needling, Cryotherapy, and Moist heat.  PLAN FOR NEXT SESSION: Assess 5xSTS (10.2 sec) , Assess response to HEP; progress therex as indicated; use of modalities, manual therapy; and TPDN as indicated.  Ha Shannahan MS, PT 03/01/24 2:31 PM

## 2024-03-05 ENCOUNTER — Ambulatory Visit

## 2024-03-07 ENCOUNTER — Ambulatory Visit: Admitting: Physical Therapy

## 2024-03-12 ENCOUNTER — Encounter

## 2024-03-14 ENCOUNTER — Ambulatory Visit: Admitting: Physical Therapy

## 2024-03-18 NOTE — Therapy (Signed)
 OUTPATIENT PHYSICAL THERAPY THORACOLUMBAR/LE EVAL and TREATMENT   Patient Name: Tanya Duncan MRN: [id number] DOB:1946/03/27, 78 y.o., female Today's Date: 03/19/2024  END OF SESSION:  PT End of Session - 03/19/24 1337     Visit Number 6    Number of Visits 17    Date for PT Re-Evaluation 03/29/24    Authorization Type MEDICARE PART A AND B    Progress Note Due on Visit 10    PT Start Time 1330    PT Stop Time 1410    PT Time Calculation (min) 40 min    Activity Tolerance Patient tolerated treatment well    Behavior During Therapy [facility] for tasks assessed/performed                Past Medical History:  Diagnosis Date   Anxiety    Arthritis    Blood transfusion without reported diagnosis 05/2019   with hip surgery   Complication of anesthesia    Fibromyalgia    Fractured pelvis (HCC) 09/15/2020   right   GERD (gastroesophageal reflux disease)    GI bleed 7829,5621   x2   Hyperlipidemia    Palpitations    Pneumonia 2005   PONV (postoperative nausea and vomiting)    Pre-diabetes    Stomach ulcer 1990   Past Surgical History:  Procedure Laterality Date   ABDOMINAL HYSTERECTOMY     FRACTURE SURGERY Right 1998   ankle   JOINT REPLACEMENT     LUMBAR FUSION  2015   OPEN REDUCTION INTERNAL FIXATION (ORIF) PROXIMAL PHALANX Right 09/21/2020   Procedure: OPEN TREATMENT OF RIGHT LONG FINGER PROXIMAL PHALANX FRACTURE;  Surgeon: Rober Chimera, MD;  Location: [facility] OR;  Service: Orthopedics;  Laterality: Right;  LENGTH OF SURGERY: 75 MIN   ORIF HUMERUS FRACTURE Left    ORIF HUMERUS FRACTURE Left 05/15/2023   Procedure: OPEN REDUCTION INTERNAL FIXATION (ORIF) PERIPROSTHETIC HUMERUS FRACTURE WITH PLATE REMOVAL;  Surgeon: Sammye Cristal, MD;  Location: [facility];  Service: Orthopedics;  Laterality: Left;   ORIF TIBIA FRACTURE Right 1998   with bone graft   PYLOROPLASTY     REVERSE SHOULDER ARTHROPLASTY Left 02/04/2021   Procedure: REVERSE SHOULDER ARTHROPLASTY WITH HARDWARE  REMOVAL;  Surgeon: Sammye Cristal, MD;  Location: [facility];  Service: Orthopedics;  Laterality: Left;   TOTAL HIP ARTHROPLASTY  2011   TOTAL HIP ARTHROPLASTY Right 05/14/2019   Procedure: Right Anterior Hip Arthroplasty;  Surgeon: Dayne Even, MD;  Location: [facility];  Service: Orthopedics;  Laterality: Right;   TOTAL SHOULDER REPLACEMENT  2014   Patient Active Problem List   Diagnosis Date Noted   S/P ORIF (open reduction internal fixation) fracture 05/15/2023   Palpitations 09/01/2020   Chest pain 09/01/2020   Abnormal ECG 09/01/2020   Moderate episode of recurrent major depressive disorder (HCC) 06/22/2020   Prediabetes 06/22/2020   Mixed hyperlipidemia 06/22/2020   History of total right hip arthroplasty 05/17/2019   Anxiety    Primary osteoarthritis of right hip 05/14/2019   Vitamin D deficiency 01/10/2019   Insomnia due to anxiety and fear 10/25/2017   Chronic prescription benzodiazepine use 10/25/2017   Chronic bilateral low back pain without sciatica 04/27/2017   Osteoarthritis 01/06/2017   GAD (generalized anxiety disorder) 01/06/2017   Rheumatoid arthritis involving multiple sites with positive rheumatoid factor (HCC) 12/12/2016   Status post lumbar spinal fusion 12/12/2016   Fibromyalgia 12/12/2016    PCP: Alfredia Ina, MD  REFERRING PROVIDER: Alfredia Ina, MD; Dayne Even, MD  REFERRING DIAG:  M43.16 (ICD-10-CM) - Spondylolisthesis, lumbar region   Rationale for Evaluation and Treatment: Rehabilitation  THERAPY DIAG:  Other low back pain  Muscle weakness (generalized)  Abnormal posture  Difficulty in walking, not elsewhere classified  Pain in right hip  ONSET DATE: Chronic- Recently 2 months  SUBJECTIVE:                                                                                                                                                                                           SUBJECTIVE STATEMENT: Pt reports the trial of the spine  stimulator was very successful with good reduction in back and R hip pain, 2/10, and her standing more upright improved. She is going to be place on the schedule to receive the device. The downside is she was more active and that activity aggravated her R knee. Pt notes she is using cold packs to manage the knee pain.  EVAL: Pt reports a chronic hx of LBP which has been elevated the past 2 months. She notes she is going to be evaluated for a peripheral nerve stimulator in May, but is hoping PT can give her some relief. She is additionally experiencing R hip pain and hopes to receive an injection.  PERTINENT HISTORY:  Lumbar fusion 2015; R THA 2020; ORIF L humerus 2024; rTSA 2022  PAIN:  Are you having pain? Yes: NPRS scale: 4/10 c pain medication. Pain range the week prior to PT: 5-8/10 Pain location: low back L>R; R hip pain Pain description: ache, sharp Aggravating factors: walking Relieving factors: pain med, muscle relaxer, Ice pack  Are you having pain? Yes: NPRS scale: 2/10 c pain medication. Pain range the week prior to PT: 5-8/10 Pain location: R hip pain Pain description: sharp Aggravating factors: walking, lying on it Relieving factors:Ice pack, asper cream with lidocaine   PRECAUTIONS: Fall  RED FLAGS: None   WEIGHT BEARING RESTRICTIONS: No  FALLS:  Has patient fallen in last 6 months? Yes. Number of falls 4 Balance issue related to significant lateral shift postural deficits and always being in a hurry  LIVING ENVIRONMENT: Lives with: lives alone Lives in: House/apartment Able to access home  OCCUPATION: retired  PLOF: Independent  PATIENT GOALS: Pain relief  NEXT MD VISIT: May  OBJECTIVE:  Note: Objective measures were completed at Evaluation unless otherwise noted.  DIAGNOSTIC FINDINGS:   See EPIC  PATIENT SURVEYS:  Modified Oswestry 30/50=60%   COGNITION: Overall cognitive status: Within functional limits for tasks  assessed     SENSATION: WFL  MUSCLE LENGTH: Hamstrings: Right WNLs deg; Left WNLs deg Andy Bannister test: Right NT deg; Left NT deg  POSTURE:  rounded shoulders, forward head, flexed trunk , and scoliosis c R lateral shoulder shift on L lateral pelvis shift, valgus  PALPATION: TTP R sup/post R hip  LUMBAR ROM:   AROM eval  Flexion Min limited  Extension Mod limited  Right lateral flexion Mod Limited  Left lateral flexion Mod limited  Right rotation Full  Left rotation Min limited   (Blank rows = not tested)  LOWER EXTREMITY ROM:     WFLs Active  Right eval Left eval  Hip flexion    Hip extension    Hip abduction    Hip adduction    Hip internal rotation    Hip external rotation    Knee flexion    Knee extension    Ankle dorsiflexion    Ankle plantarflexion    Ankle inversion    Ankle eversion     (Blank rows = not tested)  LOWER EXTREMITY MMT:    WFLs MMT Right eval Left eval  Hip flexion    Hip extension    Hip abduction    Hip adduction    Hip internal rotation    Hip external rotation    Knee flexion    Knee extension    Ankle dorsiflexion    Ankle plantarflexion    Ankle inversion    Ankle eversion     (Blank rows = not tested)  LUMBAR SPECIAL TESTS:  NT  FUNCTIONAL TESTS:  NT  GAIT: Distance walked: 200' Assistive device utilized: None Level of assistance: Complete Independence Comments: For community ambulation, pt will use a rolator  TREATMENT DATE:  [facility] Adult PT Treatment:                                                DATE: 03/19/24 Therapeutic Exercise/Therapeutic Activity: PPT x 10 Bridge with pball  2x10 SLR with AB engage c Pball press Supine GTB clam, alternating bent knee fall outs focusing on stabilization 2x12 GTB Supine Marching GTB 2x10 Figure 4 stretch x2 20" Piriformis stretch x2 20" LTR x10  [facility] Adult PT Treatment:                                                DATE: 03/01/24 Manual Therapy: STM to the TFL and ASIS  tendons LAD to the R hip c small oscillations Therapeutic Exercise/Therapeutic Activity: PPT x 10 Bridge with pball  2x10 Supine GTB clam, alternating bent knee fall outs focusing on stabilization 2x12 Supine Marching  Figure 4 stretch x2 20" Piriformis stretch x2 20" Supine hamstring stretch with strap  LTR Supine hip flexor stretch x2 30" each Modalities: Intophoresis dexamethasone  4mg /ml for 6 hours Precautions: not to get the pad wet and regarding pontential skin irritation from the adhesive of the patch  PATIENT EDUCATION:  Education details: Eval findings, POC, HEP, self care  Person educated: Patient Education method: Explanation, Demonstration, Tactile cues, Verbal cues, and Handouts Education comprehension: verbalized understanding, returned demonstration, verbal cues required, and tactile cues required  HOME EXERCISE PROGRAM: Access Code: [email or web address] URL: [email or web address] Date: 02/15/2024 Prepared by: Liborio Reeds  Exercises - Supine Lower Trunk Rotation  - 2 x daily - 7 x weekly - 1 sets - 5 reps - 5 hold - Supine Posterior Pelvic Tilt  - 2 x daily - 5 x weekly - 1 sets - 10 reps - 3 hold - Supine March with Resistance Band  - 2 x daily - 7 x weekly - 3 sets - 10 reps - Hooklying Clamshell with Resistance  - 2 x daily - 7 x weekly - 3 sets - 10 reps - 3 hold - Supine 90/90 Alternating Heel Touches with Posterior Pelvic Tilt  - 2 x daily - 5 x weekly - 1 sets - 2 reps - 30s hold - Supine 90/90 Abdominal Bracing  - 2 x daily - 5 x weekly - 1 sets - 2 reps - 30s hold - Hooklying Single Leg Bent Knee Fallouts with Resistance  - 2 x daily - 5 x weekly - 3 sets - 15 reps - Clamshell with Resistance  - 1 x daily - 5 x weekly - 2 sets - 15 reps  ASSESSMENT:  CLINICAL IMPRESSION: Pt had a good result in low back and R hip pain relief with the spine  stimulator. Pt is not sure the exact date when the device will be implanted. PT was continued today for lumbopelvic flexibility and strengthening. Pt tolerated the prescribed exercises today without adverse effects. Will assess LTGs the next PT session.  EVAL: Patient is a 77 y.o. female who was seen today for physical therapy evaluation and treatment for M43.16 (ICD-10-CM) - Spondylolisthesis, lumbar region. Pt presents with significant scoliosis with lateral shifts which puts marked strain on her R low back, R hip and R LE. A HEP was initiated to promote lumbopelvic flexibility and strengthening. Pt will benefit from skilled PT to address impairments to optimize back function with less pain.   OBJECTIVE IMPAIRMENTS: decreased activity tolerance, decreased balance, difficulty walking, decreased ROM, decreased strength, postural dysfunction, and pain.   ACTIVITY LIMITATIONS: carrying, lifting, bending, standing, squatting, stairs, and locomotion level  PARTICIPATION LIMITATIONS: meal prep, cleaning, laundry, shopping, and community activity  PERSONAL FACTORS: Fitness, Past/current experiences, and Time since onset of injury/illness/exacerbation are also affecting patient's functional outcome.   REHAB POTENTIAL: Good  CLINICAL DECISION MAKING: Evolving/moderate complexity  EVALUATION COMPLEXITY: Moderate   GOALS:  SHORT TERM GOALS = LTGs   LONG TERM GOALS: Target date: 03/29/24  Pt will be Ind in a final HEP to maintain achieved LOF  Baseline: started Goal status: INITIAL  2.  Pt will report 50% or greater improvement in low back pain for improved funtion and QOL Baseline: 5-8/10 Goal status: INITIAL  3.  Pt's MODI score will improved to 45% or less as indication of improved function  Baseline: 60% Goal status: INITIAL  4.  Improve 5xSTS by MCID of 5" as indication of improved functional mobility  Baseline: TBA 02/20/24: 10.2 sec Goal status: INITIAL  5. Pt will report 50% or  greater improvement in her R hip pain for improved function and QOL Baseline: 8/10 Goal status: INITIAL  PLAN:  PT FREQUENCY: 2x/week  PT DURATION: 6 weeks  PLANNED INTERVENTIONS: 97164- PT Re-evaluation, 97110-Therapeutic exercises,  78295- Therapeutic activity, [id number]- Neuromuscular re-education, [id number]- Self Care, [id number]- Manual therapy, [id number]- Gait training, [id number]- Aquatic Therapy, [phone]- Electrical stimulation (unattended), Patient/Family education, Balance training, Stair training, Taping, Dry Needling, Cryotherapy, and Moist heat.  PLAN FOR NEXT SESSION: Assess 5xSTS (10.2 sec) , Assess response to HEP; progress therex as indicated; use of modalities, manual therapy; and TPDN as indicated.  Tamerra Merkley MS, PT 03/19/24 2:22 PM

## 2024-03-19 ENCOUNTER — Ambulatory Visit: Attending: Student

## 2024-03-19 DIAGNOSIS — M5459 Other low back pain: Secondary | ICD-10-CM | POA: Insufficient documentation

## 2024-03-19 DIAGNOSIS — R293 Abnormal posture: Secondary | ICD-10-CM | POA: Diagnosis present

## 2024-03-19 DIAGNOSIS — M25551 Pain in right hip: Secondary | ICD-10-CM | POA: Insufficient documentation

## 2024-03-19 DIAGNOSIS — M6281 Muscle weakness (generalized): Secondary | ICD-10-CM | POA: Diagnosis present

## 2024-03-19 DIAGNOSIS — R262 Difficulty in walking, not elsewhere classified: Secondary | ICD-10-CM | POA: Insufficient documentation

## 2024-03-20 NOTE — Therapy (Signed)
 OUTPATIENT PHYSICAL THERAPY THORACOLUMBAR/LE EVAL and TREATMENT   Patient Name: Tanya Duncan MRN: [id number] DOB:02/04/1946, 78 y.o., female Today's Date: 03/21/2024  END OF SESSION:  PT End of Session - 03/21/24 1345     Visit Number 7    Number of Visits 17    Date for PT Re-Evaluation 03/29/24    Authorization Type MEDICARE PART A AND B    Progress Note Due on Visit 10    PT Start Time 1330    PT Stop Time 1410    PT Time Calculation (min) 40 min    Activity Tolerance Patient tolerated treatment well    Behavior During Therapy [facility] for tasks assessed/performed                 Past Medical History:  Diagnosis Date   Anxiety    Arthritis    Blood transfusion without reported diagnosis 05/2019   with hip surgery   Complication of anesthesia    Fibromyalgia    Fractured pelvis (HCC) 09/15/2020   right   GERD (gastroesophageal reflux disease)    GI bleed 1478,2956   x2   Hyperlipidemia    Palpitations    Pneumonia 2005   PONV (postoperative nausea and vomiting)    Pre-diabetes    Stomach ulcer 1990   Past Surgical History:  Procedure Laterality Date   ABDOMINAL HYSTERECTOMY     FRACTURE SURGERY Right 1998   ankle   JOINT REPLACEMENT     LUMBAR FUSION  2015   OPEN REDUCTION INTERNAL FIXATION (ORIF) PROXIMAL PHALANX Right 09/21/2020   Procedure: OPEN TREATMENT OF RIGHT LONG FINGER PROXIMAL PHALANX FRACTURE;  Surgeon: Rober Chimera, MD;  Location: [facility] OR;  Service: Orthopedics;  Laterality: Right;  LENGTH OF SURGERY: 75 MIN   ORIF HUMERUS FRACTURE Left    ORIF HUMERUS FRACTURE Left 05/15/2023   Procedure: OPEN REDUCTION INTERNAL FIXATION (ORIF) PERIPROSTHETIC HUMERUS FRACTURE WITH PLATE REMOVAL;  Surgeon: Sammye Cristal, MD;  Location: [facility] ORS;  Service: Orthopedics;  Laterality: Left;   ORIF TIBIA FRACTURE Right 1998   with bone graft   PYLOROPLASTY     REVERSE SHOULDER ARTHROPLASTY Left 02/04/2021   Procedure: REVERSE SHOULDER ARTHROPLASTY WITH HARDWARE  REMOVAL;  Surgeon: Sammye Cristal, MD;  Location: [facility] OR[facility];  Service: Orthopedics;  Laterality: Left;   TOTAL HIP ARTHROPLASTY  2011   TOTAL HIP ARTHROPLASTY Right 05/14/2019   Procedure: Right Anterior Hip Arthroplasty;  Surgeon: Dayne Even, MD;  Location: [facility];  Service: Orthopedics;  Laterality: Right;   TOTAL SHOULDER REPLACEMENT  2014   Patient Active Problem List   Diagnosis Date Noted   S/P ORIF (open reduction internal fixation) fracture 05/15/2023   Palpitations 09/01/2020   Chest pain 09/01/2020   Abnormal ECG 09/01/2020   Moderate episode of recurrent major depressive disorder (HCC) 06/22/2020   Prediabetes 06/22/2020   Mixed hyperlipidemia 06/22/2020   History of total right hip arthroplasty 05/17/2019   Anxiety    Primary osteoarthritis of right hip 05/14/2019   Vitamin D deficiency 01/10/2019   Insomnia due to anxiety and fear 10/25/2017   Chronic prescription benzodiazepine use 10/25/2017   Chronic bilateral low back pain without sciatica 04/27/2017   Osteoarthritis 01/06/2017   GAD (generalized anxiety disorder) 01/06/2017   Rheumatoid arthritis involving multiple sites with positive rheumatoid factor (HCC) 12/12/2016   Status post lumbar spinal fusion 12/12/2016   Fibromyalgia 12/12/2016    PCP: Alfredia Ina, MD  REFERRING PROVIDER: Alfredia Ina, MD; Dayne Even, MD  REFERRING  DIAG: M43.16 (ICD-10-CM) - Spondylolisthesis, lumbar region   Rationale for Evaluation and Treatment: Rehabilitation  THERAPY DIAG:  Other low back pain  Muscle weakness (generalized)  Abnormal posture  Difficulty in walking, not elsewhere classified  Pain in right hip  ONSET DATE: Chronic- Recently 2 months  SUBJECTIVE:                                                                                                                                                                                           SUBJECTIVE STATEMENT: Pt reports having an injection for her  R knee pain yesterday. Her low back and R hip pain are continuing to do much better.  EVAL: Pt reports a chronic hx of LBP which has been elevated the past 2 months. She notes she is going to be evaluated for a peripheral nerve stimulator in May, but is hoping PT can give her some relief. She is additionally experiencing R hip pain and hopes to receive an injection.  PERTINENT HISTORY:  Lumbar fusion 2015; R THA 2020; ORIF L humerus 2024; rTSA 2022  PAIN:  Are you having pain? Yes: NPRS scale: 0/10 c pain medication. Pain range the week prior to PT: 5-8/10 Pain location: low back L>R; R hip pain Pain description: ache, sharp Aggravating factors: walking Relieving factors: pain med, muscle relaxer, Ice pack  Are you having pain? Yes: NPRS scale: 4/10 c pain medication. Pain range the week prior to PT: 5-8/10 Pain location: R hip pain Pain description: sharp Aggravating factors: walking, lying on it Relieving factors:Ice pack, asper cream with lidocaine   PRECAUTIONS: Fall  RED FLAGS: None   WEIGHT BEARING RESTRICTIONS: No  FALLS:  Has patient fallen in last 6 months? Yes. Number of falls 4 Balance issue related to significant lateral shift postural deficits and always being in a hurry  LIVING ENVIRONMENT: Lives with: lives alone Lives in: House/apartment Able to access home  OCCUPATION: retired  PLOF: Independent  PATIENT GOALS: Pain relief  NEXT MD VISIT: May  OBJECTIVE:  Note: Objective measures were completed at Evaluation unless otherwise noted.  DIAGNOSTIC FINDINGS:   See EPIC  PATIENT SURVEYS:  Modified Oswestry 30/50=60%   COGNITION: Overall cognitive status: Within functional limits for tasks assessed     SENSATION: WFL  MUSCLE LENGTH: Hamstrings: Right WNLs deg; Left WNLs deg Andy Bannister test: Right NT deg; Left NT deg  POSTURE: rounded shoulders, forward head, flexed trunk , and scoliosis c R lateral shoulder shift on L lateral pelvis shift,  valgus  PALPATION: TTP R sup/post R hip  LUMBAR ROM:   AROM eval  Flexion Min limited  Extension Mod  limited  Right lateral flexion Mod Limited  Left lateral flexion Mod limited  Right rotation Full  Left rotation Min limited   (Blank rows = not tested)  LOWER EXTREMITY ROM:     WFLs Active  Right eval Left eval  Hip flexion    Hip extension    Hip abduction    Hip adduction    Hip internal rotation    Hip external rotation    Knee flexion    Knee extension    Ankle dorsiflexion    Ankle plantarflexion    Ankle inversion    Ankle eversion     (Blank rows = not tested)  LOWER EXTREMITY MMT:    WFLs MMT Right eval Left eval  Hip flexion    Hip extension    Hip abduction    Hip adduction    Hip internal rotation    Hip external rotation    Knee flexion    Knee extension    Ankle dorsiflexion    Ankle plantarflexion    Ankle inversion    Ankle eversion     (Blank rows = not tested)  LUMBAR SPECIAL TESTS:  NT  FUNCTIONAL TESTS:  NT  GAIT: Distance walked: 200' Assistive device utilized: None Level of assistance: Complete Independence Comments: For community ambulation, pt will use a rolator  TREATMENT DATE:  [facility] Adult PT Treatment:                                                DATE: 03/21/24 Therapeutic Exercise/Therapeutic Activity: PPT x 15 Bridge with pball  2x10 SLR with AB engage c Pball press Supine GTB clam, alternating bent knee fall outs focusing on stabilization 2x12 GTB Supine Marching GTB 2x10 Figure 4 stretch x2 20" Piriformis stretch x2 20" Seated Pball roll outs for trunk flexion  [facility] Adult PT Treatment:                                                DATE: 03/19/24 Therapeutic Exercise/Therapeutic Activity: PPT x 10 Bridge with pball  2x10 SLR with AB engage c Pball press Supine GTB clam, alternating bent knee fall outs focusing on stabilization 2x12 GTB Supine Marching GTB 2x10 Figure 4 stretch x2 20" Piriformis stretch  x2 20" LTR x10  [facility] Adult PT Treatment:                                                DATE: 03/01/24 Manual Therapy: STM to the TFL and ASIS tendons LAD to the R hip c small oscillations Therapeutic Exercise/Therapeutic Activity: PPT x 10 Bridge with pball  2x10 Supine GTB clam, alternating bent knee fall outs focusing on stabilization 2x12 Supine Marching  Figure 4 stretch x2 20" Piriformis stretch x2 20" Supine hamstring stretch with strap  LTR Supine hip flexor stretch x2 30" each Modalities: Intophoresis dexamethasone  4mg /ml for 6 hours Precautions: not to get the pad wet and regarding pontential skin irritation from the adhesive of the patch  PATIENT EDUCATION:  Education details: Eval findings, POC, HEP, self care  Person educated: Patient Education method: Explanation, Demonstration, Tactile cues, Verbal cues, and Handouts Education comprehension: verbalized understanding, returned demonstration, verbal cues required, and tactile cues required  HOME EXERCISE PROGRAM: Access Code: [email or web address] URL: [email or web address] Date: 02/15/2024 Prepared by: Liborio Reeds  Exercises - Supine Lower Trunk Rotation  - 2 x daily - 7 x weekly - 1 sets - 5 reps - 5 hold - Supine Posterior Pelvic Tilt  - 2 x daily - 5 x weekly - 1 sets - 10 reps - 3 hold - Supine March with Resistance Band  - 2 x daily - 7 x weekly - 3 sets - 10 reps - Hooklying Clamshell with Resistance  - 2 x daily - 7 x weekly - 3 sets - 10 reps - 3 hold - Supine 90/90 Alternating Heel Touches with Posterior Pelvic Tilt  - 2 x daily - 5 x weekly - 1 sets - 2 reps - 30s hold - Supine 90/90 Abdominal Bracing  - 2 x daily - 5 x weekly - 1 sets - 2 reps - 30s hold - Hooklying Single Leg Bent Knee Fallouts with Resistance  - 2 x daily - 5 x weekly - 3 sets - 15 reps - Clamshell with Resistance  - 1 x daily - 5  x weekly - 2 sets - 15 reps  ASSESSMENT:  CLINICAL IMPRESSION: PT was completed for lumbopelvic flexibility and strengthening. Pt tolerated the prescribed exercises today without adverse effects. Pt reports her low back and R hip pain are much improved following the spinal stimulator trial. With injection of the R knee yesterday, reassessing 5xSTS was deferred. Pt will continue to benefit from skilled PT to address impairments for improved function   EVAL: Patient is a 78 y.o. female who was seen today for physical therapy evaluation and treatment for M43.16 (ICD-10-CM) - Spondylolisthesis, lumbar region. Pt presents with significant scoliosis with lateral shifts which puts marked strain on her R low back, R hip and R LE. A HEP was initiated to promote lumbopelvic flexibility and strengthening. Pt will benefit from skilled PT to address impairments to optimize back function with less pain.   OBJECTIVE IMPAIRMENTS: decreased activity tolerance, decreased balance, difficulty walking, decreased ROM, decreased strength, postural dysfunction, and pain.   ACTIVITY LIMITATIONS: carrying, lifting, bending, standing, squatting, stairs, and locomotion level  PARTICIPATION LIMITATIONS: meal prep, cleaning, laundry, shopping, and community activity  PERSONAL FACTORS: Fitness, Past/current experiences, and Time since onset of injury/illness/exacerbation are also affecting patient's functional outcome.   REHAB POTENTIAL: Good  CLINICAL DECISION MAKING: Evolving/moderate complexity  EVALUATION COMPLEXITY: Moderate   GOALS:  SHORT TERM GOALS = LTGs   LONG TERM GOALS: Target date: 03/29/24  Pt will be Ind in a final HEP to maintain achieved LOF  Baseline: started Goal status: INITIAL  2.  Pt will report 50% or greater improvement in low back pain for improved funtion and QOL Baseline: 5-8/10 03/21/24: 85% Goal status:   3.  Pt's MODI score will improved to 45% or less as indication of improved  function  Baseline: 60% Goal status: INITIAL  4.  Improve 5xSTS by MCID of 5" as indication of improved functional mobility  Baseline: TBA 02/20/24: 10.2 sec 03/21/24: Deferred Goal status: INITIAL  5. Pt will report 50% or greater improvement in her R hip pain for improved function and QOL Baseline: 8/10 03/21/24: 50% Goal status: INITIAL  PLAN:  PT FREQUENCY: 2x/week  PT  DURATION: 6 weeks  PLANNED INTERVENTIONS: 97164- PT Re-evaluation, 97110-Therapeutic exercises, 97530- Therapeutic activity, [id number]- Neuromuscular re-education, 97535- Self Care, [id number]- Manual therapy, [phone]- Gait training, [phone]- Aquatic Therapy, [phone]- Electrical stimulation (unattended), Patient/Family education, Balance training, Stair training, Taping, Dry Needling, Cryotherapy, and Moist heat.  PLAN FOR NEXT SESSION: Assess 5xSTS (10.2 sec) , Assess response to HEP; progress therex as indicated; use of modalities, manual therapy; and TPDN as indicated.  Destan Franchini MS, PT 03/21/24 2:16 PM

## 2024-03-21 ENCOUNTER — Ambulatory Visit

## 2024-03-21 DIAGNOSIS — R262 Difficulty in walking, not elsewhere classified: Secondary | ICD-10-CM

## 2024-03-21 DIAGNOSIS — M6281 Muscle weakness (generalized): Secondary | ICD-10-CM

## 2024-03-21 DIAGNOSIS — M25551 Pain in right hip: Secondary | ICD-10-CM

## 2024-03-21 DIAGNOSIS — R293 Abnormal posture: Secondary | ICD-10-CM

## 2024-03-21 DIAGNOSIS — M5459 Other low back pain: Secondary | ICD-10-CM

## 2024-03-25 NOTE — Therapy (Signed)
 OUTPATIENT PHYSICAL THERAPY THORACOLUMBAR/LE EVAL and TREATMENT   Patient Name: Tanya Duncan MRN: [id number] DOB:12-06-1945, 78 y.o., female Today's Date: 03/26/2024  END OF SESSION:  PT End of Session - 03/26/24 1320     Visit Number 8    Number of Visits 17    Date for PT Re-Evaluation 03/29/24    Authorization Type MEDICARE PART A AND B    Progress Note Due on Visit 10    PT Start Time 1320    PT Stop Time 1400    PT Time Calculation (min) 40 min    Activity Tolerance Patient tolerated treatment well    Behavior During Therapy [facility] for tasks assessed/performed                  Past Medical History:  Diagnosis Date   Anxiety    Arthritis    Blood transfusion without reported diagnosis 05/2019   with hip surgery   Complication of anesthesia    Fibromyalgia    Fractured pelvis (HCC) 09/15/2020   right   GERD (gastroesophageal reflux disease)    GI bleed 4098,1191   x2   Hyperlipidemia    Palpitations    Pneumonia 2005   PONV (postoperative nausea and vomiting)    Pre-diabetes    Stomach ulcer 1990   Past Surgical History:  Procedure Laterality Date   ABDOMINAL HYSTERECTOMY     FRACTURE SURGERY Right 1998   ankle   JOINT REPLACEMENT     LUMBAR FUSION  2015   OPEN REDUCTION INTERNAL FIXATION (ORIF) PROXIMAL PHALANX Right 09/21/2020   Procedure: OPEN TREATMENT OF RIGHT LONG FINGER PROXIMAL PHALANX FRACTURE;  Surgeon: Rober Chimera, MD;  Location: [facility] OR;  Service: Orthopedics;  Laterality: Right;  LENGTH OF SURGERY: 75 MIN   ORIF HUMERUS FRACTURE Left    ORIF HUMERUS FRACTURE Left 05/15/2023   Procedure: OPEN REDUCTION INTERNAL FIXATION (ORIF) PERIPROSTHETIC HUMERUS FRACTURE WITH PLATE REMOVAL;  Surgeon: Sammye Cristal, MD;  Location: [facility] ORS;  Service: Orthopedics;  Laterality: Left;   ORIF TIBIA FRACTURE Right 1998   with bone graft   PYLOROPLASTY     REVERSE SHOULDER ARTHROPLASTY Left 02/04/2021   Procedure: REVERSE SHOULDER ARTHROPLASTY WITH  HARDWARE REMOVAL;  Surgeon: Sammye Cristal, MD;  Location: [facility] ORS;  Service: Orthopedics;  Laterality: Left;   TOTAL HIP ARTHROPLASTY  2011   TOTAL HIP ARTHROPLASTY Right 05/14/2019   Procedure: Right Anterior Hip Arthroplasty;  Surgeon: Dayne Even, MD;  Location: [facility] ORS;  Service: Orthopedics;  Laterality: Right;   TOTAL SHOULDER REPLACEMENT  2014   Patient Active Problem List   Diagnosis Date Noted   S/P ORIF (open reduction internal fixation) fracture 05/15/2023   Palpitations 09/01/2020   Chest pain 09/01/2020   Abnormal ECG 09/01/2020   Moderate episode of recurrent major depressive disorder (HCC) 06/22/2020   Prediabetes 06/22/2020   Mixed hyperlipidemia 06/22/2020   History of total right hip arthroplasty 05/17/2019   Anxiety    Primary osteoarthritis of right hip 05/14/2019   Vitamin D deficiency 01/10/2019   Insomnia due to anxiety and fear 10/25/2017   Chronic prescription benzodiazepine use 10/25/2017   Chronic bilateral low back pain without sciatica 04/27/2017   Osteoarthritis 01/06/2017   GAD (generalized anxiety disorder) 01/06/2017   Rheumatoid arthritis involving multiple sites with positive rheumatoid factor (HCC) 12/12/2016   Status post lumbar spinal fusion 12/12/2016   Fibromyalgia 12/12/2016    PCP: Alfredia Ina, MD  REFERRING PROVIDER: Alfredia Ina, MD; Dayne Even, MD  REFERRING DIAG: M43.16 (ICD-10-CM) - Spondylolisthesis, lumbar region   Rationale for Evaluation and Treatment: Rehabilitation  THERAPY DIAG:  Other low back pain  Muscle weakness (generalized)  Abnormal posture  ONSET DATE: Chronic- Recently 2 months  SUBJECTIVE:                                                                                                                                                                                           SUBJECTIVE STATEMENT:   EVAL: Pt reports a chronic hx of LBP which has been elevated the past 2 months. She notes she is  going to be evaluated for a peripheral nerve stimulator in May, but is hoping PT can give her some relief. She is additionally experiencing R hip pain and hopes to receive an injection.  PERTINENT HISTORY:  Lumbar fusion 2015; R THA 2020; ORIF L humerus 2024; rTSA 2022  PAIN:  Are you having pain? Yes: NPRS scale: 0/10 c pain medication. Pain range the week prior to PT: 5-8/10 Pain location: low back L>R; R hip pain Pain description: ache, sharp Aggravating factors: walking Relieving factors: pain med, muscle relaxer, Ice pack  Are you having pain? Yes: NPRS scale: 4/10 c pain medication. Pain range the week prior to PT: 5-8/10 Pain location: R hip pain Pain description: sharp Aggravating factors: walking, lying on it Relieving factors:Ice pack, asper cream with lidocaine   PRECAUTIONS: Fall  RED FLAGS: None   WEIGHT BEARING RESTRICTIONS: No  FALLS:  Has patient fallen in last 6 months? Yes. Number of falls 4 Balance issue related to significant lateral shift postural deficits and always being in a hurry  LIVING ENVIRONMENT: Lives with: lives alone Lives in: House/apartment Able to access home  OCCUPATION: retired  PLOF: Independent  PATIENT GOALS: Pain relief  NEXT MD VISIT: May  OBJECTIVE:  Note: Objective measures were completed at Evaluation unless otherwise noted.  DIAGNOSTIC FINDINGS:   See EPIC  PATIENT SURVEYS:  Modified Oswestry 30/50=60%   COGNITION: Overall cognitive status: Within functional limits for tasks assessed     SENSATION: WFL  MUSCLE LENGTH: Hamstrings: Right WNLs deg; Left WNLs deg Andy Bannister test: Right NT deg; Left NT deg  POSTURE: rounded shoulders, forward head, flexed trunk , and scoliosis c R lateral shoulder shift on L lateral pelvis shift, valgus  PALPATION: TTP R sup/post R hip  LUMBAR ROM:   AROM eval  Flexion Min limited  Extension Mod limited  Right lateral flexion Mod Limited  Left lateral flexion Mod limited   Right rotation Full  Left rotation Min limited   (Blank rows = not tested)  LOWER EXTREMITY ROM:  WFLs Active  Right eval Left eval  Hip flexion    Hip extension    Hip abduction    Hip adduction    Hip internal rotation    Hip external rotation    Knee flexion    Knee extension    Ankle dorsiflexion    Ankle plantarflexion    Ankle inversion    Ankle eversion     (Blank rows = not tested)  LOWER EXTREMITY MMT:    WFLs MMT Right eval Left eval  Hip flexion    Hip extension    Hip abduction    Hip adduction    Hip internal rotation    Hip external rotation    Knee flexion    Knee extension    Ankle dorsiflexion    Ankle plantarflexion    Ankle inversion    Ankle eversion     (Blank rows = not tested)  LUMBAR SPECIAL TESTS:  NT  FUNCTIONAL TESTS:  NT  GAIT: Distance walked: 200' Assistive device utilized: None Level of assistance: Complete Independence Comments: For community ambulation, pt will use a rollator  TREATMENT DATE:  [facility] Adult PT Treatment:                                                DATE: 03/25/24 Therapeutic Exercise/Activity: PPT x 3s hold 10x2 Bridge with pball  2x12 SLR with AB engage c pilates ring press 2x5 B Supine GTB clam, alternating bent knee fall outs focusing on stabilization GTB 12x B, 12/12 unilaterally Supine Marching GTB 15/15 Figure 4 stretch x2 20" (held due to apprehension) Piriformis stretch x2 20" (held due to apprehension)  [facility] Adult PT Treatment:                                                DATE: 03/21/24 Therapeutic Exercise/Therapeutic Activity: PPT x 15 Bridge with pball  2x10 SLR with AB engage c Pball press Supine GTB clam, alternating bent knee fall outs focusing on stabilization 2x12 GTB Supine Marching GTB 2x10 Figure 4 stretch x2 20" Piriformis stretch x2 20" Seated Pball roll outs for trunk flexion  [facility] Adult PT Treatment:                                                DATE:  03/19/24 Therapeutic Exercise/Therapeutic Activity: PPT x 10 Bridge with pball  2x10 SLR with AB engage c Pball press Supine GTB clam, alternating bent knee fall outs focusing on stabilization 2x12 GTB Supine Marching GTB 2x10 Figure 4 stretch x2 20" Piriformis stretch x2 20" LTR x10  [facility] Adult PT Treatment:                                                DATE: 03/01/24 Manual Therapy: STM to the TFL and ASIS tendons LAD to the R hip c small oscillations Therapeutic Exercise/Therapeutic Activity: PPT x 10 Bridge with pball  2x10 Supine GTB clam, alternating  bent knee fall outs focusing on stabilization 2x12 Supine Marching  Figure 4 stretch x2 20" Piriformis stretch x2 20" Supine hamstring stretch with strap  LTR Supine hip flexor stretch x2 30" each Modalities: Intophoresis dexamethasone  4mg /ml for 6 hours Precautions: not to get the pad wet and regarding pontential skin irritation from the adhesive of the patch                                                                                                          PATIENT EDUCATION:  Education details: Eval findings, POC, HEP, self care  Person educated: Patient Education method: Explanation, Demonstration, Tactile cues, Verbal cues, and Handouts Education comprehension: verbalized understanding, returned demonstration, verbal cues required, and tactile cues required  HOME EXERCISE PROGRAM: Access Code: [email or web address] URL: [email or web address] Date: 02/15/2024 Prepared by: Liborio Reeds  Exercises - Supine Lower Trunk Rotation  - 2 x daily - 7 x weekly - 1 sets - 5 reps - 5 hold - Supine Posterior Pelvic Tilt  - 2 x daily - 5 x weekly - 1 sets - 10 reps - 3 hold - Supine March with Resistance Band  - 2 x daily - 7 x weekly - 3 sets - 10 reps - Hooklying Clamshell with Resistance  - 2 x daily - 7 x weekly - 3 sets - 10 reps - 3 hold - Supine 90/90 Alternating Heel Touches with Posterior Pelvic Tilt  - 2 x daily - 5  x weekly - 1 sets - 2 reps - 30s hold - Supine 90/90 Abdominal Bracing  - 2 x daily - 5 x weekly - 1 sets - 2 reps - 30s hold - Hooklying Single Leg Bent Knee Fallouts with Resistance  - 2 x daily - 5 x weekly - 3 sets - 15 reps - Clamshell with Resistance  - 1 x daily - 5 x weekly - 2 sets - 15 reps  ASSESSMENT:  CLINICAL IMPRESSION: Focus of session was continued lumbosacral strengthening and stabilization training.  Added unilateral tasks as noted.  Unable to tolerate piriformis/QL/LTR stretches out of apprehension regarding exacerbation of hip symptoms.  Requires VC's for reps and pacing/breathing.  Noted a challenge with bridging tasks but no increase in discomfort reported.  EVAL: Patient is a 78 y.o. female who was seen today for physical therapy evaluation and treatment for M43.16 (ICD-10-CM) - Spondylolisthesis, lumbar region. Pt presents with significant scoliosis with lateral shifts which puts marked strain on her R low back, R hip and R LE. A HEP was initiated to promote lumbopelvic flexibility and strengthening. Pt will benefit from skilled PT to address impairments to optimize back function with less pain.   OBJECTIVE IMPAIRMENTS: decreased activity tolerance, decreased balance, difficulty walking, decreased ROM, decreased strength, postural dysfunction, and pain.   ACTIVITY LIMITATIONS: carrying, lifting, bending, standing, squatting, stairs, and locomotion level  PARTICIPATION LIMITATIONS: meal prep, cleaning, laundry, shopping, and community activity  PERSONAL FACTORS: Fitness, Past/current experiences, and Time since onset of injury/illness/exacerbation are also affecting patient's functional outcome.   REHAB POTENTIAL: Good  CLINICAL  DECISION MAKING: Evolving/moderate complexity  EVALUATION COMPLEXITY: Moderate   GOALS:  SHORT TERM GOALS = LTGs   LONG TERM GOALS: Target date: 03/29/24  Pt will be Ind in a final HEP to maintain achieved LOF  Baseline: started Goal  status: INITIAL  2.  Pt will report 50% or greater improvement in low back pain for improved funtion and QOL Baseline: 5-8/10 03/21/24: 85% Goal status:   3.  Pt's MODI score will improved to 45% or less as indication of improved function  Baseline: 60% Goal status: INITIAL  4.  Improve 5xSTS by MCID of 5" as indication of improved functional mobility  Baseline: TBA 02/20/24: 10.2 sec 03/21/24: Deferred Goal status: INITIAL  5. Pt will report 50% or greater improvement in her R hip pain for improved function and QOL Baseline: 8/10 03/21/24: 50% Goal status: INITIAL  PLAN:  PT FREQUENCY: 2x/week  PT DURATION: 6 weeks  PLANNED INTERVENTIONS: 97164- PT Re-evaluation, 97110-Therapeutic exercises, 97530- Therapeutic activity, 97112- Neuromuscular re-education, 97535- Self Care, 16109- Manual therapy, [phone]- Gait training, [phone]- Aquatic Therapy, [phone]- Electrical stimulation (unattended), Patient/Family education, Balance training, Stair training, Taping, Dry Needling, Cryotherapy, and Moist heat.  PLAN FOR NEXT SESSION: Assess 5xSTS (10.2 sec) , Assess response to HEP; progress therex as indicated; use of modalities, manual therapy; and TPDN as indicated.  Allen Ralls MS, PT 03/26/24 2:47 PM

## 2024-03-26 ENCOUNTER — Ambulatory Visit

## 2024-03-26 DIAGNOSIS — M5459 Other low back pain: Secondary | ICD-10-CM

## 2024-03-26 DIAGNOSIS — M6281 Muscle weakness (generalized): Secondary | ICD-10-CM

## 2024-03-26 DIAGNOSIS — R293 Abnormal posture: Secondary | ICD-10-CM

## 2024-03-27 NOTE — Therapy (Addendum)
 OUTPATIENT PHYSICAL THERAPY THORACOLUMBAR/LE EVAL and TREATMENT/DISCHARGE NOTE PHYSICAL THERAPY DISCHARGE SUMMARY  Visits from Start of Care: 9  Current functional level related to goals / functional outcomes: As indicated below   Remaining deficits: Improved pain coming to PT but pain is still averaging about 5/10. Pt scheduled for spinal stimulator placement on 04-12-24   Education / Equipment: HEP   Patient agrees to discharge. Patient goals were met. Patient is being discharged due to meeting the stated rehab goals.  And being pleased with current functional status   Patient Name: Tanya Duncan MRN: [id number] DOB:1946/01/11, 78 y.o., female Today's Date: 03/28/2024  END OF SESSION:  PT End of Session - 03/28/24 1334     Visit Number 9    Number of Visits 17    Date for PT Re-Evaluation 03/29/24    Authorization Type MEDICARE PART A AND B    Progress Note Due on Visit 10    PT Start Time 1330    PT Stop Time 1415    PT Time Calculation (min) 45 min    Activity Tolerance Patient tolerated treatment well    Behavior During Therapy [facility] for tasks assessed/performed                   Past Medical History:  Diagnosis Date   Anxiety    Arthritis    Blood transfusion without reported diagnosis 05/2019   with hip surgery   Complication of anesthesia    Fibromyalgia    Fractured pelvis (HCC) 09/15/2020   right   GERD (gastroesophageal reflux disease)    GI bleed 8111,8009   x2   Hyperlipidemia    Palpitations    Pneumonia 2005   PONV (postoperative nausea and vomiting)    Pre-diabetes    Stomach ulcer 1990   Past Surgical History:  Procedure Laterality Date   ABDOMINAL HYSTERECTOMY     FRACTURE SURGERY Right 1998   ankle   JOINT REPLACEMENT     LUMBAR FUSION  2015   OPEN REDUCTION INTERNAL FIXATION (ORIF) PROXIMAL PHALANX Right 09/21/2020   Procedure: OPEN TREATMENT OF RIGHT LONG FINGER PROXIMAL PHALANX FRACTURE;  Surgeon: Sebastian Lenis, MD;   Location: [facility] OR;  Service: Orthopedics;  Laterality: Right;  LENGTH OF SURGERY: 75 MIN   ORIF HUMERUS FRACTURE Left    ORIF HUMERUS FRACTURE Left 05/15/2023   Procedure: OPEN REDUCTION INTERNAL FIXATION (ORIF) PERIPROSTHETIC HUMERUS FRACTURE WITH PLATE REMOVAL;  Surgeon: Dozier Soulier, MD;  Location: [facility] ORS;  Service: Orthopedics;  Laterality: Left;   ORIF TIBIA FRACTURE Right 1998   with bone graft   PYLOROPLASTY     REVERSE SHOULDER ARTHROPLASTY Left 02/04/2021   Procedure: REVERSE SHOULDER ARTHROPLASTY WITH HARDWARE REMOVAL;  Surgeon: Dozier Soulier, MD;  Location: [facility] ORS;  Service: Orthopedics;  Laterality: Left;   TOTAL HIP ARTHROPLASTY  2011   TOTAL HIP ARTHROPLASTY Right 05/14/2019   Procedure: Right Anterior Hip Arthroplasty;  Surgeon: Sheril Coy, MD;  Location: [facility] ORS;  Service: Orthopedics;  Laterality: Right;   TOTAL SHOULDER REPLACEMENT  2014   Patient Active Problem List   Diagnosis Date Noted   S/P ORIF (open reduction internal fixation) fracture 05/15/2023   Palpitations 09/01/2020   Chest pain 09/01/2020   Abnormal ECG 09/01/2020   Moderate episode of recurrent major depressive disorder (HCC) 06/22/2020   Prediabetes 06/22/2020   Mixed hyperlipidemia 06/22/2020   History of total right hip arthroplasty 05/17/2019   Anxiety    Primary osteoarthritis of right hip 05/14/2019  Vitamin D deficiency 01/10/2019   Insomnia due to anxiety and fear 10/25/2017   Chronic prescription benzodiazepine use 10/25/2017   Chronic bilateral low back pain without sciatica 04/27/2017   Osteoarthritis 01/06/2017   GAD (generalized anxiety disorder) 01/06/2017   Rheumatoid arthritis involving multiple sites with positive rheumatoid factor (HCC) 12/12/2016   Status post lumbar spinal fusion 12/12/2016   Fibromyalgia 12/12/2016    PCP: [name], MD  REFERRING PROVIDER: [name], MD; Sheril Coy, MD  REFERRING DIAG: M43.16 (ICD-10-CM) - Spondylolisthesis, lumbar  region   Rationale for Evaluation and Treatment: Rehabilitation  THERAPY DIAG:  Other low back pain  Muscle weakness (generalized)  Abnormal posture  Difficulty in walking, not elsewhere classified  ONSET DATE: Chronic- Recently 2 months  SUBJECTIVE:                                                                                                                                                                                           SUBJECTIVE STATEMENT: I am still 5/10 pain and I am getting my spinal stimulator on 04-12-24.  EVAL: Pt reports a chronic hx of LBP which has been elevated the past 2 months. She notes she is going to be evaluated for a peripheral nerve stimulator in May, but is hoping PT can give her some relief. She is additionally experiencing R hip pain and hopes to receive an injection.  PERTINENT HISTORY:  Lumbar fusion 2015; R THA 2020; ORIF L humerus 2024; rTSA 2022  PAIN:  Are you having pain? Yes: NPRS scale: 0/10 c pain medication. Pain range the week prior to PT: 5-8/10 Pain location: low back L>R; R hip pain Pain description: ache, sharp Aggravating factors: walking Relieving factors: pain med, muscle relaxer, Ice pack  Are you having pain? Yes: NPRS scale: 4/10 c pain medication. Pain range the week prior to PT: 5-8/10 Pain location: R hip pain Pain description: sharp Aggravating factors: walking, lying on it Relieving factors:Ice pack, asper cream with lidocaine   PRECAUTIONS: Fall  RED FLAGS: None   WEIGHT BEARING RESTRICTIONS: No  FALLS:  Has patient fallen in last 6 months? Yes. Number of falls 4 Balance issue related to significant lateral shift postural deficits and always being in a hurry  LIVING ENVIRONMENT: Lives with: lives alone Lives in: House/apartment Able to access home  OCCUPATION: retired  PLOF: Independent  PATIENT GOALS: Pain relief  NEXT MD VISIT: May  OBJECTIVE:  Note: Objective measures were completed at  Evaluation unless otherwise noted.  DIAGNOSTIC FINDINGS:   See EPIC  PATIENT SURVEYS:  Modified Oswestry 30/50=60%  30% 03-28-24  COGNITION: Overall cognitive status: Within functional  limits for tasks assessed     SENSATION: WFL  MUSCLE LENGTH: Hamstrings: Right WNLs deg; Left WNLs deg Debby test: Right NT deg; Left NT deg  POSTURE: rounded shoulders, forward head, flexed trunk , and scoliosis c R lateral shoulder shift on L lateral pelvis shift, valgus  PALPATION: TTP R sup/post R hip  LUMBAR ROM:   AROM eval 03-28-24  Flexion Min limited Fingertips to floor   Extension Mod limited Minimally limited  Right lateral flexion Mod Limited Minimally limited  Left lateral flexion Mod limited Minimally limited  Right rotation Full full  Left rotation Min limited full   (Blank rows = not tested)  LOWER EXTREMITY ROM:     WFLs Active  Right eval Left eval  Hip flexion    Hip extension    Hip abduction    Hip adduction    Hip internal rotation    Hip external rotation    Knee flexion    Knee extension    Ankle dorsiflexion    Ankle plantarflexion    Ankle inversion    Ankle eversion     (Blank rows = not tested)  LOWER EXTREMITY MMT:    WFLs MMT Right eval Left eval  Hip flexion    Hip extension    Hip abduction    Hip adduction    Hip internal rotation    Hip external rotation    Knee flexion    Knee extension    Ankle dorsiflexion    Ankle plantarflexion    Ankle inversion    Ankle eversion     (Blank rows = not tested)  LUMBAR SPECIAL TESTS:  NT  FUNCTIONAL TESTS:  NT  GAIT: Distance walked: 200' Assistive device utilized: None Level of assistance: Complete Independence Comments: For community ambulation, pt will use a rollator  TREATMENT DATE:  [facility] Adult PT Treatment:                                                DATE: 03-28-24 Therapeutic Exercise: PPT x 3s hold 10x2 LTR 5 x 15 sec on each side Bridge with  red 55 cm pball   2x12 Marching with GTB resistance band 2 x 10 Hooklying clamshell with GTB 2 x 10 90/90 alternating heel touches with PPT Supine 90/90 abdominal bracing Sidelying clamshell on L and R  1 x 10 each Hip flexion off table stretch 3 x 30 sec hold right and Left Review of HEP and cautions for post surgery use of exercise before incision Self care  Area resources for equipment     Progressive strengthening as needed and cautions for pre surgery exercise     [facility] Adult PT Treatment:                                                DATE: 03/26/24 Therapeutic Exercise/Activity: PPT x 3s hold 10x2 Bridge with pball  2x12 SLR with AB engage c pilates ring press 2x5 B Supine GTB clam, alternating bent knee fall outs focusing on stabilization GTB 12x B, 12/12 unilaterally Supine Marching GTB 15/15 Figure 4 stretch x2 20 (held due to apprehension) Piriformis stretch x2 20 (held due to apprehension)  [facility] Adult PT Treatment:  DATE: 03/21/24 Therapeutic Exercise/Therapeutic Activity: PPT x 15 Bridge with pball  2x10 SLR with AB engage c Pball press Supine GTB clam, alternating bent knee fall outs focusing on stabilization 2x12 GTB Supine Marching GTB 2x10 Figure 4 stretch x2 20 Piriformis stretch x2 20 Seated Pball roll outs for trunk flexion  [facility] Adult PT Treatment:                                                DATE: 03/19/24 Therapeutic Exercise/Therapeutic Activity: PPT x 10 Bridge with pball  2x10 SLR with AB engage c Pball press Supine GTB clam, alternating bent knee fall outs focusing on stabilization 2x12 GTB Supine Marching GTB 2x10 Figure 4 stretch x2 20 Piriformis stretch x2 20 LTR x10  [facility] Adult PT Treatment:                                                DATE: 03/01/24 Manual Therapy: STM to the TFL and ASIS tendons LAD to the R hip c small oscillations Therapeutic Exercise/Therapeutic Activity: PPT x 10 Bridge with pball   2x10 Supine GTB clam, alternating bent knee fall outs focusing on stabilization 2x12 Supine Marching  Figure 4 stretch x2 20 Piriformis stretch x2 20 Supine hamstring stretch with strap  LTR Supine hip flexor stretch x2 30 each Modalities: Intophoresis dexamethasone  4mg /ml for 6 hours Precautions: not to get the pad wet and regarding pontential skin irritation from the adhesive of the patch                                                                                                          PATIENT EDUCATION:  Education details: Eval findings, POC, HEP, self care  Person educated: Patient Education method: Explanation, Demonstration, Tactile cues, Verbal cues, and Handouts Education comprehension: verbalized understanding, returned demonstration, verbal cues required, and tactile cues required  HOME EXERCISE PROGRAM: Access Code: [email or web address] URL: [email or web address] Date: 02/15/2024 Prepared by: Dasie Daft  Exercises - Supine Lower Trunk Rotation  - 2 x daily - 7 x weekly - 1 sets - 5 reps - 5 hold - Supine Posterior Pelvic Tilt  - 2 x daily - 5 x weekly - 1 sets - 10 reps - 3 hold - Supine March with Resistance Band  - 2 x daily - 7 x weekly - 3 sets - 10 reps - Hooklying Clamshell with Resistance  - 2 x daily - 7 x weekly - 3 sets - 10 reps - 3 hold - Supine 90/90 Alternating Heel Touches with Posterior Pelvic Tilt  - 2 x daily - 5 x weekly - 1 sets - 2 reps - 30s hold - Supine 90/90 Abdominal Bracing  - 2 x daily - 5 x weekly - 1 sets - 2 reps - 30s hold -  Hooklying Single Leg Bent Knee Fallouts with Resistance  - 2 x daily - 5 x weekly - 3 sets - 15 reps - Clamshell with Resistance  - 1 x daily - 5 x weekly - 2 sets - 15 reps  ASSESSMENT:  CLINICAL IMPRESSION: Mrs. Meroney returns for last visit before she receives a spinal stimulator schedule on 04-12-24. Pt also plans to have R knee TKA eventually as well and today is ready for DC.  Pt has  significant  valgus on Right LE and was unable to perform  STS today Focus of session was continued  reinforcing HEP and concentrating on lumbosacral strengthening and stabilization training. Pt has met all goals except for 5 x STS test deferred due to R knee pain. ODI improved to 30%  EVAL: Patient is a 78 y.o. female who was seen today for physical therapy evaluation and treatment for M43.16 (ICD-10-CM) - Spondylolisthesis, lumbar region. Pt presents with significant scoliosis with lateral shifts which puts marked strain on her R low back, R hip and R LE. A HEP was initiated to promote lumbopelvic flexibility and strengthening. Pt will benefit from skilled PT to address impairments to optimize back function with less pain.   OBJECTIVE IMPAIRMENTS: decreased activity tolerance, decreased balance, difficulty walking, decreased ROM, decreased strength, postural dysfunction, and pain.   ACTIVITY LIMITATIONS: carrying, lifting, bending, standing, squatting, stairs, and locomotion level  PARTICIPATION LIMITATIONS: meal prep, cleaning, laundry, shopping, and community activity  PERSONAL FACTORS: Fitness, Past/current experiences, and Time since onset of injury/illness/exacerbation are also affecting patient's functional outcome.   REHAB POTENTIAL: Good  CLINICAL DECISION MAKING: Evolving/moderate complexity  EVALUATION COMPLEXITY: Moderate   GOALS:  SHORT TERM GOALS = LTGs   LONG TERM GOALS: Target date: 03/29/24  Pt will be Ind in a final HEP to maintain achieved LOF  Baseline: started Goal status: MET 03-28-24  2.  Pt will report 50% or greater improvement in low back pain for improved funtion and QOL Baseline: 5-8/10 03/21/24: 85% 03-28-24 85% Goal status: MET  3.  Pt's MODI score will improved to 45% or less as indication of improved function  Baseline: 60% 03-28-24 30% Goal status: MET  4.  Improve 5xSTS by MCID of 5 as indication of improved functional mobility  Baseline: TBA 02/20/24: 10.2  sec 03/21/24: Deferred 03-28-24  Deferred due to Right valgus knee pain Goal status: Deferred  5. Pt will report 50% or greater improvement in her R hip pain for improved function and QOL Baseline: 8/10 03/21/24: 50% 03-28-24 50% Goal status: MET  PLAN:  PT FREQUENCY: 2x/week  PT DURATION: 6 weeks  PLANNED INTERVENTIONS: [id number]- PT Re-evaluation, 97110-Therapeutic exercises, 97530- Therapeutic activity, 97112- Neuromuscular re-education, 97535- Self Care, [id number]- Manual therapy, [id number]- Gait training, [phone]- Aquatic Therapy, [phone]- Electrical stimulation (unattended), Patient/Family education, Balance training, Stair training, Taping, Dry Needling, Cryotherapy, and Moist heat.  PLAN FOR NEXT SESSION: Assess 5xSTS (10.2 sec) , Assess response to HEP; progress therex as indicated; use of modalities, manual therapy; and TPDN as indicated.  Graydon Dingwall, PT, A[facility] Certified Exercise Expert for the Aging Adult  03/28/24 2:17 PM Phone: [phone] Fax: [phone]   Graydon Dingwall, PT, [facility] Certified Exercise Expert for the Aging Adult  06/05/24 8:36 AM Phone: [phone] Fax: [phone]

## 2024-03-28 ENCOUNTER — Ambulatory Visit: Admitting: Physical Therapy

## 2024-03-28 ENCOUNTER — Encounter: Payer: Self-pay | Admitting: Physical Therapy

## 2024-03-28 DIAGNOSIS — M6281 Muscle weakness (generalized): Secondary | ICD-10-CM

## 2024-03-28 DIAGNOSIS — R262 Difficulty in walking, not elsewhere classified: Secondary | ICD-10-CM

## 2024-03-28 DIAGNOSIS — M5459 Other low back pain: Secondary | ICD-10-CM

## 2024-03-28 DIAGNOSIS — R293 Abnormal posture: Secondary | ICD-10-CM

## 2024-06-21 ENCOUNTER — Other Ambulatory Visit: Payer: Self-pay | Admitting: Orthopaedic Surgery

## 2024-06-24 NOTE — H&P (Signed)
 TOTAL KNEE ADMISSION H&P  Patient is being admitted for right total knee arthroplasty.  Subjective:  Chief Complaint:right knee pain.  HPI: Tanya Duncan, 78 y.o. female, has a history of pain and functional disability in the right knee due to arthritis and has failed non-surgical conservative treatments for greater than 12 weeks to includeNSAID's and/or analgesics, corticosteriod injections, flexibility and strengthening excercises, supervised PT with diminished ADL's post treatment, use of assistive devices, weight reduction as appropriate, and activity modification.  Onset of symptoms was gradual, starting 5 years ago with gradually worsening course since that time. The patient noted no past surgery on the right knee(s).  Patient currently rates pain in the right knee(s) at 10 out of 10 with activity. Patient has night pain, worsening of pain with activity and weight bearing, pain that interferes with activities of daily living, crepitus, and joint swelling.  Patient has evidence of subchondral cysts, subchondral sclerosis, periarticular osteophytes, and joint space narrowing by imaging studies. There is no active infection.  Patient Active Problem List   Diagnosis Date Noted   S/P ORIF (open reduction internal fixation) fracture 05/15/2023   Palpitations 09/01/2020   Chest pain 09/01/2020   Abnormal ECG 09/01/2020   Moderate episode of recurrent major depressive disorder (HCC) 06/22/2020   Prediabetes 06/22/2020   Mixed hyperlipidemia 06/22/2020   History of total right hip arthroplasty 05/17/2019   Anxiety    Primary osteoarthritis of right hip 05/14/2019   Vitamin D deficiency 01/10/2019   Insomnia due to anxiety and fear 10/25/2017   Chronic prescription benzodiazepine use 10/25/2017   Chronic bilateral low back pain without sciatica 04/27/2017   Osteoarthritis 01/06/2017   GAD (generalized anxiety disorder) 01/06/2017   Rheumatoid arthritis involving multiple sites with positive  rheumatoid factor (HCC) 12/12/2016   Status post lumbar spinal fusion 12/12/2016   Fibromyalgia 12/12/2016   Past Medical History:  Diagnosis Date   Anxiety    Arthritis    Blood transfusion without reported diagnosis 05/2019   with hip surgery   Complication of anesthesia    Fibromyalgia    Fractured pelvis (HCC) 09/15/2020   right   GERD (gastroesophageal reflux disease)    GI bleed 8111,8009   x2   Hyperlipidemia    Palpitations    Pneumonia 2005   PONV (postoperative nausea and vomiting)    Pre-diabetes    Stomach ulcer 1990    Past Surgical History:  Procedure Laterality Date   ABDOMINAL HYSTERECTOMY     FRACTURE SURGERY Right 1998   ankle   JOINT REPLACEMENT     LUMBAR FUSION  2015   OPEN REDUCTION INTERNAL FIXATION (ORIF) PROXIMAL PHALANX Right 09/21/2020   Procedure: OPEN TREATMENT OF RIGHT LONG FINGER PROXIMAL PHALANX FRACTURE;  Surgeon: Sebastian Lenis, MD;  Location: [facility] OR;  Service: Orthopedics;  Laterality: Right;  LENGTH OF SURGERY: 75 MIN   ORIF HUMERUS FRACTURE Left    ORIF HUMERUS FRACTURE Left 05/15/2023   Procedure: OPEN REDUCTION INTERNAL FIXATION (ORIF) PERIPROSTHETIC HUMERUS FRACTURE WITH PLATE REMOVAL;  Surgeon: Dozier Soulier, MD;  Location: [facility] ORS;  Service: Orthopedics;  Laterality: Left;   ORIF TIBIA FRACTURE Right 1998   with bone graft   PYLOROPLASTY     REVERSE SHOULDER ARTHROPLASTY Left 02/04/2021   Procedure: REVERSE SHOULDER ARTHROPLASTY WITH HARDWARE REMOVAL;  Surgeon: Dozier Soulier, MD;  Location: [facility] ORS;  Service: Orthopedics;  Laterality: Left;   TOTAL HIP ARTHROPLASTY  2011   TOTAL HIP ARTHROPLASTY Right 05/14/2019   Procedure: Right Anterior Hip  Arthroplasty;  Surgeon: Sheril Coy, MD;  Location: [facility] ORS;  Service: Orthopedics;  Laterality: Right;   TOTAL SHOULDER REPLACEMENT  2014    No current facility-administered medications for this encounter.   Current Outpatient Medications  Medication Sig Dispense Refill Last  Dose/Taking   acetaminophen  (TYLENOL ) 500 MG tablet Take 500-1,000 mg by mouth See admin instructions. Take 500 mg by mouth in the morning and an additional 500-1,000 mg three times a day as needed for pain      Ascorbic Acid (VITAMIN C) 1000 MG tablet Take 1,000-2,000 mg by mouth daily.      aspirin  EC 81 MG tablet Take 1 tablet (81 mg total) by mouth daily. Swallow whole. 30 tablet 12    b complex vitamins tablet Take 1 tablet by mouth daily.      bismuth subsalicylate (PEPTO BISMOL) 262 MG/15ML suspension Take 30 mLs by mouth every 6 (six) hours as needed for diarrhea or loose stools (or upset stomach).      calcium carbonate (TUMS EX) 750 MG chewable tablet Chew 1-3 tablets by mouth as needed for heartburn.      CALCIUM PO Take 500 mg by mouth See admin instructions. Chew 500 mg (1 gummie) by mouth once day      diclofenac Sodium (VOLTAREN) 1 % GEL Apply 2 g topically 4 (four) times daily as needed (for back pain).      ergocalciferol (VITAMIN D2) 1.25 MG (50000 UT) capsule Take 50,000 Units by mouth every Monday.       escitalopram  (LEXAPRO ) 20 MG tablet Take 20 mg by mouth daily.      famotidine  (PEPCID ) 40 MG tablet TAKE 1 TABLET(40 MG) BY MOUTH TWICE DAILY 180 tablet 1    ferrous sulfate 325 (65 FE) MG tablet Take 325 mg by mouth daily as needed (for low iron- take with food).      gabapentin  (NEURONTIN ) 300 MG capsule Take 600 mg by mouth 2 (two) times daily.      lidocaine  (LIDODERM ) 5 % Place 1 patch onto the skin daily as needed (for pain).      LORazepam  (ATIVAN ) 1 MG tablet Take 1 mg by mouth 3 (three) times daily as needed for anxiety.      Multiple Vitamin (MULTIVITAMIN WITH MINERALS) TABS tablet Take 1 tablet by mouth daily.      nortriptyline  (PAMELOR ) 25 MG capsule Take 50 mg by mouth at bedtime.      nystatin cream (MYCOSTATIN) Apply 1 Application topically 2 (two) times daily as needed for dry skin.      ondansetron  (ZOFRAN ) 4 MG tablet Take 4 mg by mouth as needed for nausea  or vomiting.      oxyCODONE  (OXY IR/ROXICODONE ) 5 MG immediate release tablet Take 1-2 tablets (5-10 mg total) by mouth every 6 (six) hours as needed for severe pain. 12 tablet 0    pantoprazole  (PROTONIX ) 40 MG tablet TAKE 1 TABLET(40 MG) BY MOUTH TWICE DAILY 180 tablet 0    pravastatin  (PRAVACHOL ) 80 MG tablet Take 80 mg by mouth daily.      tiZANidine  (ZANAFLEX ) 4 MG tablet Take 1 tablet (4 mg total) by mouth every 8 (eight) hours as needed for muscle spasms. (Patient taking differently: Take 2-4 mg by mouth every 8 (eight) hours as needed for muscle spasms.) 30 tablet 1    zolpidem  (AMBIEN ) 10 MG tablet Take 10 mg by mouth at bedtime as needed for sleep (**MUST BE TORRENT BRAND**).  Allergies  Allergen Reactions   Atorvastatin Diarrhea   Ibuprofen Other (See Comments)    Has had GI bleeds x2 in past   Sage [Salvia Officinalis] Palpitations and Other (See Comments)    Heart races, flu-like symptoms    Sulfa Antibiotics Nausea And Vomiting   Avelox [Moxifloxacin Hcl In Nacl] Palpitations   Indocin [Indomethacin] Other (See Comments)    Dizziness    Social History   Tobacco Use   Smoking status: Never   Smokeless tobacco: Never  Substance Use Topics   Alcohol use: Yes    Comment: rare    Family History  Problem Relation Age of Onset   Colon cancer Father 13   Stomach cancer Paternal Uncle    Esophageal cancer Neg Hx    Rectal cancer Neg Hx      Review of Systems  Musculoskeletal:  Positive for arthralgias.       Right knee  All other systems reviewed and are negative.   Objective:  Physical Exam Constitutional:      Appearance: Normal appearance.  HENT:     Head: Normocephalic and atraumatic.     Nose: Nose normal.     Mouth/Throat:     Pharynx: Oropharynx is clear.  Eyes:     Extraocular Movements: Extraocular movements intact.  Pulmonary:     Effort: Pulmonary effort is normal.  Abdominal:     Palpations: Abdomen is soft.  Musculoskeletal:      Cervical back: Normal range of motion.     Comments: Right knee persists with a mild valgus deformity.  Her motion is about 0-110.  She has lateral joint line pain and crepitation.  Hip motion is full and straight leg raise is negative.  She has normal unlabored respirations.  Sensation and motor function are intact in her feet with palpable pulses on both sides.    Skin:    General: Skin is warm and dry.  Neurological:     General: No focal deficit present.     Mental Status: She is alert and oriented to person, place, and time.  Psychiatric:        Mood and Affect: Mood normal.        Behavior: Behavior normal.        Thought Content: Thought content normal.        Judgment: Judgment normal.     Vital signs in last 24 hours: BP: ()/()  Arterial Line BP: ()/()   Labs:   Estimated body mass index is 26.7 kg/m as calculated from the following:   Height as of 06/25/23: 5' 2 (1.575 m).   Weight as of 06/25/23: 66.2 kg.   Imaging Review Plain radiographs demonstrate severe degenerative joint disease of the right knee(s). The overall alignment isneutral. The bone quality appears to be good for age and reported activity level.      Assessment/Plan:  End stage primary arthritis, right knee   The patient history, physical examination, clinical judgment of the provider and imaging studies are consistent with end stage degenerative joint disease of the right knee(s) and total knee arthroplasty is deemed medically necessary. The treatment options including medical management, injection therapy arthroscopy and arthroplasty were discussed at length. The risks and benefits of total knee arthroplasty were presented and reviewed. The risks due to aseptic loosening, infection, stiffness, patella tracking problems, thromboembolic complications and other imponderables were discussed. The patient acknowledged the explanation, agreed to proceed with the plan and consent was signed. Patient is being  admitted for inpatient treatment for surgery, pain control, PT, OT, prophylactic antibiotics, VTE prophylaxis, progressive ambulation and ADL's and discharge planning. The patient is planning to be discharged home with home health services   Patient's anticipated LOS is less than 2 midnights, meeting these requirements: - Younger than 27 - Lives within 1 hour of care - Has a competent adult at home to recover with post-op recover - NO history of  - Chronic pain requiring opiods  - Diabetes  - Coronary Artery Disease  - Heart failure  - Heart attack  - Stroke  - DVT/VTE  - Cardiac arrhythmia  - Respiratory Failure/COPD  - Renal failure  - Anemia  - Advanced Liver disease

## 2024-07-10 ENCOUNTER — Ambulatory Visit: Admitting: Physician Assistant

## 2024-07-17 NOTE — Patient Instructions (Signed)
 SURGICAL WAITING ROOM VISITATION  Patients having surgery or a procedure may have no more than 2 support people in the waiting area - these visitors may rotate.    Children under the age of 23 must have an adult with them who is not the patient.  Visitors with respiratory illnesses are discouraged from visiting and should remain at home.  If the patient needs to stay at the hospital during part of their recovery, the visitor guidelines for inpatient rooms apply. Pre-op nurse will coordinate an appropriate time for 1 support person to accompany patient in pre-op.  This support person may not rotate.    Please refer to the [facility] website for the visitor guidelines for Inpatients (after your surgery is over and you are in a regular room).       Your procedure is scheduled on: 07/30/24   Report to [facility] Main Entrance    Report to admitting at 5:15 AM   Call this number if you have problems the morning of surgery [phone]   Do not eat food :After Midnight.   After Midnight you may have the following liquids  until 4:30 AM DAY OF SURGERY  Water  Non-Citrus Juices (without pulp, NO RED-Apple, White grape, White cranberry) Black Coffee (NO MILK/CREAM OR CREAMERS, sugar ok)  Clear Tea (NO MILK/CREAM OR CREAMERS, sugar ok) regular and decaf                             Plain Jell-O (NO RED)                                           Fruit ices (not with fruit pulp, NO RED)                                     Popsicles (NO RED)                                                               Sports drinks like Gatorade (NO RED)                  The day of surgery:  Drink ONE (1) Pre-Surgery G2 at 4:30 AM the morning of surgery. Drink in one sitting. Do not sip.  This drink was given to you during your hospital  pre-op appointment visit. Nothing else to drink after completing the  Pre-Surgery G2    Oral Hygiene is also important to reduce your risk of infection.                                     Remember - BRUSH YOUR TEETH THE MORNING OF SURGERY WITH YOUR REGULAR TOOTHPASTE  DENTURES WILL BE REMOVED PRIOR TO SURGERY PLEASE DO NOT APPLY Poly grip OR ADHESIVES!!!   Stop all vitamins and herbal supplements 7 days before surgery.   Take these medicines the morning of surgery with A SIP OF WATER : escitaprolam, famotidine , gabapentin , Pantoprazole , pravastatin , tylenol   if needed, oxycodone  if needed.             You may not have any metal on your body including hair pins, jewelry, and body piercing             Do not wear make-up, lotions, powders, perfumes/cologne, or deodorant  Do not wear nail polish including gel and S&S, artificial/acrylic nails, or any other type of covering on natural nails including finger and toenails. If you have artificial nails, gel coating, etc. that needs to be removed by a nail salon please have this removed prior to surgery or surgery may need to be canceled/ delayed if the surgeon/ anesthesia feels like they are unable to be safely monitored.   Do not shave  48 hours prior to surgery.    Do not bring valuables to the hospital. [facility] IS NOT             RESPONSIBLE   FOR VALUABLES.   Contacts, glasses, dentures or bridgework may not be worn into surgery.  DO NOT BRING YOUR HOME MEDICATIONS TO THE HOSPITAL. PHARMACY WILL DISPENSE MEDICATIONS LISTED ON YOUR MEDICATION LIST TO YOU DURING YOUR ADMISSION IN THE HOSPITAL!    Patients discharged on the day of surgery will not be allowed to drive home.  Someone NEEDS to stay with you for the first 24 hours after anesthesia.   Special Instructions: Bring a copy of your healthcare power of attorney and living will documents the day of surgery if you haven't scanned them before.              Please read over the following fact sheets you were given: IF YOU HAVE QUESTIONS ABOUT YOUR PRE-OP INSTRUCTIONS PLEASE CALL [phone] Tanya Duncan   If you received a COVID test during your  pre-op visit  it is requested that you wear a mask when out in public, stay away from anyone that may not be feeling well and notify your surgeon if you develop symptoms. If you test positive for Covid or have been in contact with anyone that has tested positive in the last 10 days please notify you surgeon.      Pre-operative 5 CHG Bath Instructions   You can play a key role in reducing the risk of infection after surgery. Your skin needs to be as free of germs as possible. You can reduce the number of germs on your skin by washing with CHG (chlorhexidine  gluconate) soap before surgery. CHG is an antiseptic soap that kills germs and continues to kill germs even after washing.   DO NOT use if you have an allergy to chlorhexidine /CHG or antibacterial soaps. If your skin becomes reddened or irritated, stop using the CHG and notify one of our RNs at [phone].   Please shower with the CHG soap starting 4 days before surgery using the following schedule:     Please keep in mind the following:  DO NOT shave, including legs and underarms, starting the day of your first shower.   You may shave your face at any point before/day of surgery.  Place clean sheets on your bed the day you start using CHG soap. Use a clean washcloth (not used since being washed) for each shower. DO NOT sleep with pets once you start using the CHG.   CHG Shower Instructions:  If you choose to wash your hair and private area, wash first with your normal shampoo/soap.  After you use shampoo/soap, rinse your hair and body thoroughly  to remove shampoo/soap residue.  Turn the water  OFF and apply about 3 tablespoons (45 ml) of CHG soap to a CLEAN washcloth.  Apply CHG soap ONLY FROM YOUR NECK DOWN TO YOUR TOES (washing for 3-5 minutes)  DO NOT use CHG soap on face, private areas, open wounds, or sores.  Pay special attention to the area where your surgery is being performed.  If you are having back surgery, having someone  wash your back for you may be helpful. Wait 2 minutes after CHG soap is applied, then you may rinse off the CHG soap.  Pat dry with a clean towel  Put on clean clothes/pajamas   If you choose to wear lotion, please use ONLY the CHG-compatible lotions on the back of this paper.     Additional instructions for the day of surgery: DO NOT APPLY any lotions, deodorants, cologne, or perfumes.   Put on clean/comfortable clothes.  Brush your teeth.  Ask your nurse before applying any prescription medications to the skin.      CHG Compatible Lotions   Aveeno Moisturizing lotion  Cetaphil Moisturizing Cream  Cetaphil Moisturizing Lotion  Clairol Herbal Essence Moisturizing Lotion, Dry Skin  Clairol Herbal Essence Moisturizing Lotion, Extra Dry Skin  Clairol Herbal Essence Moisturizing Lotion, Normal Skin  Curel Age Defying Therapeutic Moisturizing Lotion with Alpha Hydroxy  Curel Extreme Care Body Lotion  Curel Soothing Hands Moisturizing Hand Lotion  Curel Therapeutic Moisturizing Cream, Fragrance-Free  Curel Therapeutic Moisturizing Lotion, Fragrance-Free  Curel Therapeutic Moisturizing Lotion, Original Formula  Eucerin Daily Replenishing Lotion  Eucerin Dry Skin Therapy Plus Alpha Hydroxy Crme  Eucerin Dry Skin Therapy Plus Alpha Hydroxy Lotion  Eucerin Original Crme  Eucerin Original Lotion  Eucerin Plus Crme Eucerin Plus Lotion  Eucerin TriLipid Replenishing Lotion  Keri Anti-Bacterial Hand Lotion  Keri Deep Conditioning Original Lotion Dry Skin Formula Softly Scented  Keri Deep Conditioning Original Lotion, Fragrance Free Sensitive Skin Formula  Keri Lotion Fast Absorbing Fragrance Free Sensitive Skin Formula  Keri Lotion Fast Absorbing Softly Scented Dry Skin Formula  Keri Original Lotion  Keri Skin Renewal Lotion Keri Silky Smooth Lotion  Keri Silky Smooth Sensitive Skin Lotion  Nivea Body Creamy Conditioning Oil  Nivea Body Extra Enriched TEFL teacher Moisturizing Lotion Nivea Crme  Nivea Skin Firming Lotion  NutraDerm 30 Skin Lotion  NutraDerm Skin Lotion  NutraDerm Therapeutic Skin Cream  NutraDerm Therapeutic Skin Lotion  ProShield Protective Hand Cream  Provon moisturizing lotion

## 2024-07-17 NOTE — Progress Notes (Signed)
 COVID Vaccine received:  []  No []  Yes Date of any COVID positive Test in last 90 days:  PCP - Dr. Alm Pura [facility] Cardiologist -   Chest x-ray -  EKG -  06/21/24 Epic Stress Test -  ECHO -  Cardiac Cath -   Bowel Prep - []  No  []   Yes ______  Pacemaker / ICD device []  No []  Yes   Spinal Cord Stimulator:[]  No []  Yes       History of Sleep Apnea? []  No []  Yes   CPAP used?- []  No []  Yes    Does the patient monitor blood sugar?          []  No []  Yes  []  N/A  Patient has: []  NO Hx DM   []  Pre-DM                 []  DM1  []   DM2 Does patient have a [employer] or Dexacom? []  No []  Yes   Fasting Blood Sugar Ranges-  Checks Blood Sugar _____ times a day  GLP1 agonist / usual dose -  GLP1 instructions:  SGLT-2 inhibitors / usual dose -  SGLT-2 instructions:   Blood Thinner / Instructions: Aspirin  Instructions:  Comments:   Activity level: Patient is able / unable to climb a flight of stairs without difficulty; []  No CP  []  No SOB, but would have ___   Patient can / can not perform ADLs without assistance.   Anesthesia review:   Patient denies shortness of breath, fever, cough and chest pain at PAT appointment.  Patient verbalized understanding and agreement to the Pre-Surgical Instructions that were given to them at this PAT appointment. Patient was also educated of the need to review these PAT instructions again prior to his/her surgery.I reviewed the appropriate phone numbers to call if they have any and questions or concerns.

## 2024-07-18 ENCOUNTER — Encounter (HOSPITAL_COMMUNITY)
Admission: RE | Admit: 2024-07-18 | Discharge: 2024-07-18 | Disposition: A | Discharge status: Home / Self Care | Source: Ambulatory Visit | Attending: Anesthesiology | Admitting: Anesthesiology

## 2024-07-18 ENCOUNTER — Encounter (HOSPITAL_COMMUNITY): Payer: Self-pay

## 2024-07-18 DIAGNOSIS — Z01818 Encounter for other preprocedural examination: Secondary | ICD-10-CM

## 2024-08-08 ENCOUNTER — Other Ambulatory Visit: Payer: Self-pay | Admitting: Orthopaedic Surgery

## 2024-08-13 ENCOUNTER — Encounter (HOSPITAL_COMMUNITY): Payer: Self-pay

## 2024-08-13 ENCOUNTER — Encounter (HOSPITAL_COMMUNITY): Payer: Self-pay | Admitting: Anesthesiology

## 2024-08-13 ENCOUNTER — Ambulatory Visit (HOSPITAL_COMMUNITY)
Admission: RE | Admit: 2024-08-13 | Discharge: 2024-08-13 | Disposition: A | Discharge status: Home / Self Care | Source: Ambulatory Visit | Attending: Orthopaedic Surgery | Admitting: Orthopaedic Surgery

## 2024-08-13 ENCOUNTER — Encounter (HOSPITAL_COMMUNITY)
Admission: RE | Admit: 2024-08-13 | Discharge: 2024-08-13 | Disposition: A | Discharge status: Home / Self Care | Source: Ambulatory Visit | Attending: Orthopaedic Surgery | Admitting: Orthopaedic Surgery

## 2024-08-13 ENCOUNTER — Other Ambulatory Visit: Payer: Self-pay

## 2024-08-13 ENCOUNTER — Encounter (HOSPITAL_COMMUNITY): Payer: Self-pay | Admitting: Physician Assistant

## 2024-08-13 DIAGNOSIS — Z01818 Encounter for other preprocedural examination: Secondary | ICD-10-CM | POA: Insufficient documentation

## 2024-08-13 LAB — CBC
HCT: 36.8 % (ref 36.0–46.0)
Hemoglobin: 11.5 g/dL — ABNORMAL LOW (ref 12.0–15.0)
MCH: 27.8 pg (ref 26.0–34.0)
MCHC: 31.3 g/dL (ref 30.0–36.0)
MCV: 89.1 fL (ref 80.0–100.0)
Platelets: 332 K/uL (ref 150–400)
RBC: 4.13 MIL/uL (ref 3.87–5.11)
RDW: 13.9 % (ref 11.5–15.5)
WBC: 6.6 K/uL (ref 4.0–10.5)
nRBC: 0 % (ref 0.0–0.2)

## 2024-08-13 LAB — SURGICAL PCR SCREEN
MRSA, PCR: NEGATIVE
Staphylococcus aureus: NEGATIVE

## 2024-08-13 LAB — BASIC METABOLIC PANEL WITH GFR
Anion gap: 10 (ref 5–15)
BUN: 6 mg/dL — ABNORMAL LOW (ref 8–23)
CO2: 29 mmol/L (ref 22–32)
Calcium: 10 mg/dL (ref 8.9–10.3)
Chloride: 98 mmol/L (ref 98–111)
Creatinine, Ser: 0.66 mg/dL (ref 0.44–1.00)
GFR, Estimated: >60 mL/min (ref >60)
Glucose, Bld: 84 mg/dL (ref 70–99)
Potassium: 4.8 mmol/L (ref 3.5–5.1)
Sodium: 136 mmol/L (ref 135–145)

## 2024-08-13 NOTE — Patient Instructions (Addendum)
 SURGICAL WAITING ROOM VISITATION  Patients having surgery or a procedure may have no more than 2 support people in the waiting area - these visitors may rotate.    Children under the age of 84 must have an adult with them who is not the patient.  Visitors with respiratory illnesses are discouraged from visiting and should remain at home.  If the patient needs to stay at the hospital during part of their recovery, the visitor guidelines for inpatient rooms apply. Pre-op nurse will coordinate an appropriate time for 1 support person to accompany patient in pre-op.  This support person may not rotate.    Please refer to the [facility] website for the visitor guidelines for Inpatients (after your surgery is over and you are in a regular room).       Your procedure is scheduled on: 08/20/24   Report to [facility] Main Entrance    Report to admitting at 7:10 AM   Call this number if you have problems the morning of surgery [phone]   Do not eat food :After Midnight.   After Midnight you may have the following liquids until 6 :40 AM DAY OF SURGERY  Water  Non-Citrus Juices (without pulp, NO RED-Apple, White grape, White cranberry) Black Coffee (NO MILK/CREAM OR CREAMERS, sugar ok)  Clear Tea (NO MILK/CREAM OR CREAMERS, sugar ok) regular and decaf                             Plain Jell-O (NO RED)                                           Fruit ices (not with fruit pulp, NO RED)                                     Popsicles (NO RED)                                                               Sports drinks like Gatorade (NO RED)                  The day of surgery:  Drink ONE (1) Pre-Surgery  G2 at 6:40 AM the morning of surgery. Drink in one sitting. Do not sip.  This drink was given to you during your hospital  pre-op appointment visit. Nothing else to drink after completing the  Pre-Surgery G2.    Oral Hygiene is also important to reduce your risk of infection.                                     Remember - BRUSH YOUR TEETH THE MORNING OF SURGERY WITH YOUR REGULAR TOOTHPASTE  DENTURES WILL BE REMOVED PRIOR TO SURGERY PLEASE DO NOT APPLY Poly grip OR ADHESIVES!!!   Stop all vitamins and herbal supplements 7 days before surgery.   Take these medicines the morning of surgery with A SIP OF WATER : Tylenol , Escitaprolam, famotidine , gabapentin ,Ativan , oxycodone ,  Pantoprazole , Pravastatin              You may not have any metal on your body including hair pins, jewelry, and body piercing             Do not wear make-up, lotions, powders, perfumes/cologne, or deodorant  Do not wear nail polish including gel and S&S, artificial/acrylic nails, or any other type of covering on natural nails including finger and toenails. If you have artificial nails, gel coating, etc. that needs to be removed by a nail salon please have this removed prior to surgery or surgery may need to be canceled/ delayed if the surgeon/ anesthesia feels like they are unable to be safely monitored.   Do not shave  48 hours prior to surgery.    Do not bring valuables to the hospital. [facility] IS NOT             RESPONSIBLE   FOR VALUABLES.   Contacts, glasses, dentures or bridgework may not be worn into surgery.  DO NOT BRING YOUR HOME MEDICATIONS TO THE HOSPITAL. PHARMACY WILL DISPENSE MEDICATIONS LISTED ON YOUR MEDICATION LIST TO YOU DURING YOUR ADMISSION IN THE HOSPITAL!    Patients discharged on the day of surgery will not be allowed to drive home.  Someone NEEDS to stay with you for the first 24 hours after anesthesia.   Special Instructions: Bring a copy of your healthcare power of attorney and living will documents the day of surgery if you haven't scanned them before.              Please read over the following fact sheets you were given: IF YOU HAVE QUESTIONS ABOUT YOUR PRE-OP INSTRUCTIONS PLEASE CALL [phone] [facility]   If you received a COVID test during your pre-op visit   it is requested that you wear a mask when out in public, stay away from anyone that may not be feeling well and notify your surgeon if you develop symptoms. If you test positive for Covid or have been in contact with anyone that has tested positive in the last 10 days please notify you surgeon.      Pre-operative 4 CHG Bath Instructions  DYNA-Hex 4 Chlorhexidine  Gluconate 4% Solution Antiseptic 4 fl. oz   You can play a key role in reducing the risk of infection after surgery. Your skin needs to be as free of germs as possible. You can reduce the number of germs on your skin by washing with CHG (chlorhexidine  gluconate) soap before surgery. CHG is an antiseptic soap that kills germs and continues to kill germs even after washing.   DO NOT use if you have an allergy to chlorhexidine /CHG or antibacterial soaps. If your skin becomes reddened or irritated, stop using the CHG and notify one of our RNs at   Please shower with the CHG soap starting 4 days before surgery using the following schedule:     Please keep in mind the following:  DO NOT shave, including legs and underarms, starting the day of your first shower.   You may shave your face at any point before/day of surgery.  Place clean sheets on your bed the day you start using CHG soap. Use a clean washcloth (not used since being washed) for each shower. DO NOT sleep with pets once you start using the CHG.  CHG Shower Instructions:  If you choose to wash your hair and private area, wash first with your normal shampoo/soap.  After you use shampoo/soap,  rinse your hair and body thoroughly to remove shampoo/soap residue.  Turn the water  OFF and apply about 3 tablespoons (45 ml) of CHG soap to a CLEAN washcloth.  Apply CHG soap ONLY FROM YOUR NECK DOWN TO YOUR TOES (washing for 3-5 minutes)  DO NOT use CHG soap on face, private areas, open wounds, or sores.  Pay special attention to the area where your surgery is being performed.  If you  are having back surgery, having someone wash your back for you may be helpful. Wait 2 minutes after CHG soap is applied, then you may rinse off the CHG soap.  Pat dry with a clean towel  Put on clean clothes/pajamas   If you choose to wear lotion, please use ONLY the CHG-compatible lotions on the back of this paper.     Additional instructions for the day of surgery: DO NOT APPLY any lotions, deodorants, cologne, or perfumes.   Put on clean/comfortable clothes.  Brush your teeth.  Ask your nurse before applying any prescription medications to the skin.   CHG Compatible Lotions   Aveeno Moisturizing lotion  Cetaphil Moisturizing Cream  Cetaphil Moisturizing Lotion  Clairol Herbal Essence Moisturizing Lotion, Dry Skin  Clairol Herbal Essence Moisturizing Lotion, Extra Dry Skin  Clairol Herbal Essence Moisturizing Lotion, Normal Skin  Curel Age Defying Therapeutic Moisturizing Lotion with Alpha Hydroxy  Curel Extreme Care Body Lotion  Curel Soothing Hands Moisturizing Hand Lotion  Curel Therapeutic Moisturizing Cream, Fragrance-Free  Curel Therapeutic Moisturizing Lotion, Fragrance-Free  Curel Therapeutic Moisturizing Lotion, Original Formula  Eucerin Daily Replenishing Lotion  Eucerin Dry Skin Therapy Plus Alpha Hydroxy Crme  Eucerin Dry Skin Therapy Plus Alpha Hydroxy Lotion  Eucerin Original Crme  Eucerin Original Lotion  Eucerin Plus Crme Eucerin Plus Lotion  Eucerin TriLipid Replenishing Lotion  Keri Anti-Bacterial Hand Lotion  Keri Deep Conditioning Original Lotion Dry Skin Formula Softly Scented  Keri Deep Conditioning Original Lotion, Fragrance Free Sensitive Skin Formula  Keri Lotion Fast Absorbing Fragrance Free Sensitive Skin Formula  Keri Lotion Fast Absorbing Softly Scented Dry Skin Formula  Keri Original Lotion  Keri Skin Renewal Lotion Keri Silky Smooth Lotion  Keri Silky Smooth Sensitive Skin Lotion  Nivea Body Creamy Conditioning Oil  Nivea Body Extra  Enriched Teacher, adult education Moisturizing Lotion Nivea Crme  Nivea Skin Firming Lotion  NutraDerm 30 Skin Lotion  NutraDerm Skin Lotion  NutraDerm Therapeutic Skin Cream  NutraDerm Therapeutic Skin Lotion  ProShield Protective Hand Cream  Provon moisturizing lotion

## 2024-08-13 NOTE — Progress Notes (Addendum)
 COVID Vaccine received:  []  No [x]  Yes Date of any COVID positive Test in last 90 days: no PCP - [facility] Dr. Pura Cardiologist - n/a  Chest x-ray -  EKG -   Stress Test -  ECHO -  Cardiac Cath -   Bowel Prep - [x]  No  []   Yes ______  Pacemaker / ICD device [x]  No []  Yes   Spinal Cord Stimulator:[]  No [x]  Yes       History of Sleep Apnea? [x]  No []  Yes   CPAP used?- [x]  No []  Yes    Does the patient monitor blood sugar?          [x]  No []  Yes  []  N/A  Patient has: [x]  NO Hx DM   []  Pre-DM                 []  DM1  []   DM2 Does patient have a [employer] or Dexacom? []  No []  Yes   Fasting Blood Sugar Ranges-  Checks Blood Sugar _____ times a day  GLP1 agonist / usual dose - no GLP1 instructions:  SGLT-2 inhibitors / usual dose - no SGLT-2 instructions:   Blood Thinner / Instructions:no Aspirin  Instructions:no  Comments:   Activity level: Patient is able  to climb a flight of stairs without difficulty; [x]  No CP  [x]  No SOB,   Patient can perform ADLs without assistance.   Anesthesia review: Abnl. EKG  Patient denies shortness of breath, fever, cough and chest pain at PAT appointment.  Patient verbalized understanding and agreement to the Pre-Surgical Instructions that were given to them at this PAT appointment. Patient was also educated of the need to review these PAT instructions again prior to his/her surgery.I reviewed the appropriate phone numbers to call if they have any and questions or concerns.

## 2024-08-14 ENCOUNTER — Encounter (HOSPITAL_COMMUNITY): Admission: RE | Admit: 2024-08-14 | Source: Ambulatory Visit

## 2024-08-17 NOTE — H&P (Signed)
 TOTAL KNEE ADMISSION H&P  Patient is being admitted for right total knee arthroplasty.  Subjective:  Chief Complaint:right knee pain.  HPI: Tanya Duncan, 78 y.o. female, has a history of pain and functional disability in the right knee due to arthritis and has failed non-surgical conservative treatments for greater than 12 weeks to includeNSAID's and/or analgesics, corticosteriod injections, flexibility and strengthening excercises, supervised PT with diminished ADL's post treatment, use of assistive devices, weight reduction as appropriate, and activity modification.  Onset of symptoms was gradual, starting 5 years ago with gradually worsening course since that time. The patient noted no past surgery on the right knee(s).  Patient currently rates pain in the right knee(s) at 10 out of 10 with activity. Patient has night pain, worsening of pain with activity and weight bearing, pain that interferes with activities of daily living, crepitus, and joint swelling.  Patient has evidence of subchondral cysts, subchondral sclerosis, periarticular osteophytes, and joint space narrowing by imaging studies.  There is no active infection.  Patient Active Problem List   Diagnosis Date Noted   S/P ORIF (open reduction internal fixation) fracture 05/15/2023   Palpitations 09/01/2020   Chest pain 09/01/2020   Abnormal ECG 09/01/2020   Moderate episode of recurrent major depressive disorder (HCC) 06/22/2020   Prediabetes 06/22/2020   Mixed hyperlipidemia 06/22/2020   History of total right hip arthroplasty 05/17/2019   Anxiety    Primary osteoarthritis of right hip 05/14/2019   Vitamin D deficiency 01/10/2019   Insomnia due to anxiety and fear 10/25/2017   Chronic prescription benzodiazepine use 10/25/2017   Chronic bilateral low back pain without sciatica 04/27/2017   Osteoarthritis 01/06/2017   GAD (generalized anxiety disorder) 01/06/2017   Rheumatoid arthritis involving multiple sites with positive  rheumatoid factor (HCC) 12/12/2016   Status post lumbar spinal fusion 12/12/2016   Fibromyalgia 12/12/2016   Past Medical History:  Diagnosis Date   Anxiety    Arthritis    Blood transfusion without reported diagnosis 05/2019   with hip surgery   Complication of anesthesia    Fibromyalgia    Fractured pelvis (HCC) 09/15/2020   right   GERD (gastroesophageal reflux disease)    GI bleed 8111,8009   x2   Hyperlipidemia    Palpitations    Pneumonia 2005   PONV (postoperative nausea and vomiting)    Pre-diabetes    Stomach ulcer 1990    Past Surgical History:  Procedure Laterality Date   ABDOMINAL HYSTERECTOMY     FRACTURE SURGERY Right 1998   ankle   JOINT REPLACEMENT     LUMBAR FUSION  2015   OPEN REDUCTION INTERNAL FIXATION (ORIF) PROXIMAL PHALANX Right 09/21/2020   Procedure: OPEN TREATMENT OF RIGHT LONG FINGER PROXIMAL PHALANX FRACTURE;  Surgeon: Sebastian Lenis, MD;  Location: [facility] OR;  Service: Orthopedics;  Laterality: Right;  LENGTH OF SURGERY: 75 MIN   ORIF HUMERUS FRACTURE Left    ORIF HUMERUS FRACTURE Left 05/15/2023   Procedure: OPEN REDUCTION INTERNAL FIXATION (ORIF) PERIPROSTHETIC HUMERUS FRACTURE WITH PLATE REMOVAL;  Surgeon: Dozier Soulier, MD;  Location: [facility] ORS;  Service: Orthopedics;  Laterality: Left;   ORIF TIBIA FRACTURE Right 1998   with bone graft   PYLOROPLASTY     REVERSE SHOULDER ARTHROPLASTY Left 02/04/2021   Procedure: REVERSE SHOULDER ARTHROPLASTY WITH HARDWARE REMOVAL;  Surgeon: Dozier Soulier, MD;  Location: [facility] ORS;  Service: Orthopedics;  Laterality: Left;   TOTAL HIP ARTHROPLASTY  2011   TOTAL HIP ARTHROPLASTY Right 05/14/2019   Procedure: Right Anterior  Hip Arthroplasty;  Surgeon: Sheril Coy, MD;  Location: [facility] ORS;  Service: Orthopedics;  Laterality: Right;   TOTAL SHOULDER REPLACEMENT  2014    No current facility-administered medications for this encounter.   Current Outpatient Medications  Medication Sig Dispense Refill Last  Dose/Taking   acetaminophen  (TYLENOL ) 500 MG tablet Take 500-1,000 mg by mouth every 6 (six) hours as needed (pain.).   Taking As Needed   Ascorbic Acid (VITAMIN C) 1000 MG tablet Take 1,000 mg by mouth daily.   Taking   b complex vitamins tablet Take 1 tablet by mouth daily.   Taking   bismuth subsalicylate (PEPTO BISMOL) 262 MG/15ML suspension Take 30 mLs by mouth every 6 (six) hours as needed for diarrhea or loose stools (or upset stomach).   Taking As Needed   calcium carbonate (TUMS EX) 750 MG chewable tablet Chew 1-3 tablets by mouth as needed for heartburn.   Taking As Needed   CALCIUM PO Take 2 each by mouth in the morning.   Taking   diclofenac Sodium (VOLTAREN) 1 % GEL Apply 2 g topically 4 (four) times daily as needed (for back pain).   Taking As Needed   ergocalciferol (VITAMIN D2) 1.25 MG (50000 UT) capsule Take 50,000 Units by mouth every Monday.    Taking   escitalopram  (LEXAPRO ) 20 MG tablet Take 20 mg by mouth in the morning.   Taking   famotidine  (PEPCID ) 40 MG tablet TAKE 1 TABLET(40 MG) BY MOUTH TWICE DAILY 180 tablet 1 Taking   ferrous sulfate 325 (65 FE) MG tablet Take 325 mg by mouth every 14 (fourteen) days.   Taking   gabapentin  (NEURONTIN ) 300 MG capsule Take 600 mg by mouth 2 (two) times daily.   Taking   ketorolac  (TORADOL ) 60 MG/2ML SOLN injection Inject 60 mg into the muscle daily as needed (pain.).   Taking As Needed   lidocaine  (LIDODERM ) 5 % Place 1 patch onto the skin daily as needed (for pain).   Taking As Needed   Lidocaine  HCl (ASPERCREME LIDOCAINE  ESSENTIAL EX) Apply 1 Application topically 3 (three) times daily as needed (pain.).   Taking As Needed   LORazepam  (ATIVAN ) 1 MG tablet Take 1 mg by mouth in the morning, at noon, and at bedtime.   Taking   nortriptyline  (PAMELOR ) 25 MG capsule Take 75 mg by mouth at bedtime.   Taking   nystatin cream (MYCOSTATIN) Apply 1 Application topically 2 (two) times daily as needed (irritation (corners of mouth)).   Taking As  Needed   pantoprazole  (PROTONIX ) 40 MG tablet TAKE 1 TABLET(40 MG) BY MOUTH TWICE DAILY 180 tablet 0 Taking   pravastatin  (PRAVACHOL ) 80 MG tablet Take 80 mg by mouth in the morning.   Taking   promethazine  (PHENERGAN ) 25 MG tablet Take 25 mg by mouth every 6 (six) hours as needed for nausea or vomiting.   Taking As Needed   tiZANidine  (ZANAFLEX ) 4 MG tablet Take 1 tablet (4 mg total) by mouth every 8 (eight) hours as needed for muscle spasms. (Patient taking differently: Take 2-4 mg by mouth every 8 (eight) hours as needed for muscle spasms.) 30 tablet 1 Taking Differently   traMADol (ULTRAM) 50 MG tablet Take 50-100 mg by mouth 2 (two) times daily as needed (pain.).   Taking As Needed   zolpidem  (AMBIEN ) 10 MG tablet Take 10 mg by mouth at bedtime as needed for sleep (**MUST BE TORRENT BRAND**).   Taking As Needed   Multiple Vitamin (MULTIVITAMIN  WITH MINERALS) TABS tablet Take 1 tablet by mouth daily.      Allergies  Allergen Reactions   Atorvastatin Diarrhea   Bupropion Nausea And Vomiting   Duloxetine Hcl Nausea And Vomiting   Ibuprofen Other (See Comments)    Has had GI bleeds x2 in past   Sage [Salvia Officinalis] Palpitations and Other (See Comments)    Heart races, flu-like symptoms    Sulfa Antibiotics Nausea And Vomiting   Avelox [Moxifloxacin Hcl In Nacl] Palpitations   Indocin [Indomethacin] Other (See Comments)    Dizziness    Social History   Tobacco Use   Smoking status: Never   Smokeless tobacco: Never  Substance Use Topics   Alcohol use: Not Currently    Comment: rare    Family History  Problem Relation Age of Onset   Colon cancer Father 63   Stomach cancer Paternal Uncle    Esophageal cancer Neg Hx    Rectal cancer Neg Hx      Review of Systems  Musculoskeletal:  Positive for arthralgias.       Right knee  All other systems reviewed and are negative.   Objective:  Physical Exam Constitutional:      Appearance: Normal appearance.  HENT:      Head: Normocephalic and atraumatic.     Nose: Nose normal.     Mouth/Throat:     Pharynx: Oropharynx is clear.  Eyes:     Extraocular Movements: Extraocular movements intact.  Pulmonary:     Effort: Pulmonary effort is normal.  Abdominal:     Palpations: Abdomen is soft.  Musculoskeletal:     Cervical back: Normal range of motion.     Comments: Right knee persists with a mild valgus deformity.  Her motion is about 0-110.  She has lateral joint line pain and crepitation.  Hip motion is full and straight leg raise is negative.  She has normal unlabored respirations.  Sensation and motor function are intact in her feet with palpable pulses on both sides.   Skin:    General: Skin is warm and dry.  Neurological:     General: No focal deficit present.     Mental Status: She is alert and oriented to person, place, and time. Mental status is at baseline.  Psychiatric:        Mood and Affect: Mood normal.        Behavior: Behavior normal.        Thought Content: Thought content normal.        Judgment: Judgment normal.     Vital signs in last 24 hours:    Labs:   Estimated body mass index is 24.94 kg/m as calculated from the following:   Height as of 08/13/24: 5' 1 (1.549 m).   Weight as of 08/13/24: 59.9 kg.   Imaging Review Plain radiographs demonstrate severe degenerative joint disease of the right knee(s). The overall alignment isneutral. The bone quality appears to be good for age and reported activity level.      Assessment/Plan:  End stage primary arthritis, right knee   The patient history, physical examination, clinical judgment of the provider and imaging studies are consistent with end stage degenerative joint disease of the right knee(s) and total knee arthroplasty is deemed medically necessary. The treatment options including medical management, injection therapy arthroscopy and arthroplasty were discussed at length. The risks and benefits of total knee  arthroplasty were presented and reviewed. The risks due to aseptic loosening, infection, stiffness,  patella tracking problems, thromboembolic complications and other imponderables were discussed. The patient acknowledged the explanation, agreed to proceed with the plan and consent was signed. Patient is being admitted for inpatient treatment for surgery, pain control, PT, OT, prophylactic antibiotics, VTE prophylaxis, progressive ambulation and ADL's and discharge planning. The patient is planning to be discharged home with home health services   Patient's anticipated LOS is less than 2 midnights, meeting these requirements: - Younger than 29 - Lives within 1 hour of care - Has a competent adult at home to recover with post-op recover - NO history of  - Chronic pain requiring opiods  - Diabetes  - Coronary Artery Disease  - Heart failure  - Heart attack  - Stroke  - DVT/VTE  - Cardiac arrhythmia  - Respiratory Failure/COPD  - Renal failure  - Anemia  - Advanced Liver disease

## 2024-08-26 ENCOUNTER — Ambulatory Visit: Admitting: Gastroenterology

## 2024-08-26 ENCOUNTER — Encounter: Payer: Self-pay | Admitting: Gastroenterology

## 2024-08-26 ENCOUNTER — Other Ambulatory Visit

## 2024-08-26 VITALS — BP 134/82 | HR 103 | Ht 60.5 in | Wt 132.5 lb

## 2024-08-26 DIAGNOSIS — K219 Gastro-esophageal reflux disease without esophagitis: Secondary | ICD-10-CM

## 2024-08-26 DIAGNOSIS — Z800 Family history of malignant neoplasm of digestive organs: Secondary | ICD-10-CM | POA: Diagnosis not present

## 2024-08-26 DIAGNOSIS — R159 Full incontinence of feces: Secondary | ICD-10-CM | POA: Diagnosis not present

## 2024-08-26 DIAGNOSIS — D649 Anemia, unspecified: Secondary | ICD-10-CM

## 2024-08-26 LAB — CBC WITH DIFFERENTIAL/PLATELET
Basophils Absolute: 0 K/uL (ref 0.0–0.1)
Basophils Relative: 0.5 % (ref 0.0–3.0)
Eosinophils Absolute: 0.2 K/uL (ref 0.0–0.7)
Eosinophils Relative: 1.9 % (ref 0.0–5.0)
HCT: 35.2 % — ABNORMAL LOW (ref 36.0–46.0)
Hemoglobin: 11.7 g/dL — ABNORMAL LOW (ref 12.0–15.0)
Lymphocytes Relative: 37.4 % (ref 12.0–46.0)
Lymphs Abs: 3.2 K/uL (ref 0.7–4.0)
MCHC: 33.2 g/dL (ref 30.0–36.0)
MCV: 86.6 fl (ref 78.0–100.0)
Monocytes Absolute: 0.8 K/uL (ref 0.1–1.0)
Monocytes Relative: 8.8 % (ref 3.0–12.0)
Neutro Abs: 4.5 K/uL (ref 1.4–7.7)
Neutrophils Relative %: 51.4 % (ref 43.0–77.0)
Platelets: 305 K/uL (ref 150.0–400.0)
RBC: 4.06 Mil/uL (ref 3.87–5.11)
RDW: 14.7 % (ref 11.5–15.5)
WBC: 8.7 K/uL (ref 4.0–10.5)

## 2024-08-26 LAB — COMPREHENSIVE METABOLIC PANEL WITH GFR
ALT: 31 U/L (ref 0–35)
AST: 29 U/L (ref 0–37)
Albumin: 4.4 g/dL (ref 3.5–5.2)
Alkaline Phosphatase: 59 U/L (ref 39–117)
BUN: 8 mg/dL (ref 6–23)
CO2: 31 meq/L (ref 19–32)
Calcium: 9 mg/dL (ref 8.4–10.5)
Chloride: 94 meq/L — ABNORMAL LOW (ref 96–112)
Creatinine, Ser: 0.7 mg/dL (ref 0.40–1.20)
GFR: 83.79 mL/min (ref >60.00)
Glucose, Bld: 94 mg/dL (ref 70–99)
Potassium: 3.7 meq/L (ref 3.5–5.1)
Sodium: 133 meq/L — ABNORMAL LOW (ref 135–145)
Total Bilirubin: 0.5 mg/dL (ref 0.2–1.2)
Total Protein: 6.4 g/dL (ref 6.0–8.3)

## 2024-08-26 LAB — B12 AND FOLATE PANEL
Folate: 18.2 ng/mL (ref >5.9)
Vitamin B-12: 408 pg/mL (ref 211–911)

## 2024-08-26 LAB — IBC + FERRITIN
Ferritin: 28.5 ng/mL (ref 10.0–291.0)
Iron: 48 ug/dL (ref 42–145)
Saturation Ratios: 12.6 % — ABNORMAL LOW (ref 20.0–50.0)
TIBC: 382.2 ug/dL (ref 250.0–450.0)
Transferrin: 273 mg/dL (ref 212.0–360.0)

## 2024-08-26 MED ORDER — NA SULFATE-K SULFATE-MG SULF 17.5-3.13-1.6 GM/177ML PO SOLN: 1.0000 | Freq: Once | ORAL | 0 refills | Status: AC

## 2024-08-26 NOTE — Progress Notes (Signed)
 Tanya Duncan [id number] 1945-12-13   Chief Complaint: Stool leakage  Referring Provider: [name], MD Primary GI MD: Sampson (previous Dr. Aneita)  HPI: Tanya Duncan is a 78 y.o. female with past medical history of anxiety, fibromyalgia, GERD, HLD, prediabetes, hysterectomy who presents today for annual follow-up.    Last seen in office 02/06/2023 by Dr. Aneita.  History of GERD, LA grade B esophagitis, suspected gastroparesis.  At time of last visit reflux symptoms under better control, though with some regurgitation when bending over and with taking evening medications and water  close to bedtime.  Having some worsening problems with fecal leakage at that time as well.  She was advised to continue pantoprazole  40 mg twice daily, famotidine  40 mg twice daily, begin gastroparesis diet.  Also advised to do Kegel exercises for fecal leakage, MiraLAX  daily to achieve 1-2 complete bowel movements per day.  Repeat colonoscopy recommended for July 2024.   Discussed the use of AI scribe software for clinical note transcription with the patient, who gave verbal consent to proceed.  History of Present Illness Tanya Duncan is a 78 year old female who presents with worsening fecal incontinence.  She experiences stool leakage approximately four days a week with unformed, 'sludge-like' stools, complicating cleaning. Lactose elimination from her diet has helped, but symptoms persist. Fiber capsules have not improved stool consistency. She has a bowel movement almost daily without feeling of incomplete evacuation. Leakage occurs without awareness and involves small amounts of stool. No blood, dark stools, or pain with bowel movements.  She also has urinary incontinence with urgency, especially when standing from a sitting position. No painful urination.  Has not previously seen pelvic floor physical therapy for evaluation and is interested in this.  Previous GI Procedures/Imaging   EGD  10/17/2022 - LA Grade B reflux esophagitis with no bleeding.  - No endoscopic esophageal abnormality to explain patient' s dysphagia. Esophagus dilated.  - A medium amount of food (residue) in the stomach.  - Erythematous mucosa in the gastric body and fundus. Biopsied.  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  - Normal duodenal bulb and second portion of the duodenum.  Colonoscopy 05/23/2018 - The examined portion of the ileum was normal.  - Moderate diverticulosis in the left colon.  - Small internal hemorrhoids.  - The examination was otherwise normal on direct and retroflexion views. Random biopsies obtained. - Recall 5 years with Dr. Shila  Past Medical History:  Diagnosis Date   Anxiety    Arthritis    Blood transfusion without reported diagnosis 05/2019   with hip surgery   Complication of anesthesia    Fibromyalgia    Fractured pelvis (HCC) 09/15/2020   right   GERD (gastroesophageal reflux disease)    GI bleed 8111,8009   x2   Hyperlipidemia    Palpitations    Pneumonia 2005   PONV (postoperative nausea and vomiting)    Pre-diabetes    Stomach ulcer 1990    Past Surgical History:  Procedure Laterality Date   ABDOMINAL HYSTERECTOMY     FRACTURE SURGERY Right 1998   ankle   JOINT REPLACEMENT     LUMBAR FUSION  2015   OPEN REDUCTION INTERNAL FIXATION (ORIF) PROXIMAL PHALANX Right 09/21/2020   Procedure: OPEN TREATMENT OF RIGHT LONG FINGER PROXIMAL PHALANX FRACTURE;  Surgeon: Sebastian Lenis, MD;  Location: [facility] OR;  Service: Orthopedics;  Laterality: Right;  LENGTH OF SURGERY: 75 MIN   ORIF HUMERUS FRACTURE Left    ORIF HUMERUS FRACTURE Left  05/15/2023   Procedure: OPEN REDUCTION INTERNAL FIXATION (ORIF) PERIPROSTHETIC HUMERUS FRACTURE WITH PLATE REMOVAL;  Surgeon: Dozier Soulier, MD;  Location: [facility] ORS;  Service: Orthopedics;  Laterality: Left;   ORIF TIBIA FRACTURE Right 1998   with bone graft   PYLOROPLASTY     REVERSE SHOULDER ARTHROPLASTY Left 02/04/2021    Procedure: REVERSE SHOULDER ARTHROPLASTY WITH HARDWARE REMOVAL;  Surgeon: Dozier Soulier, MD;  Location: [facility] ORS;  Service: Orthopedics;  Laterality: Left;   TOTAL HIP ARTHROPLASTY  2011   TOTAL HIP ARTHROPLASTY Right 05/14/2019   Procedure: Right Anterior Hip Arthroplasty;  Surgeon: Sheril Coy, MD;  Location: [facility] ORS;  Service: Orthopedics;  Laterality: Right;   TOTAL SHOULDER REPLACEMENT  2014    Current Outpatient Medications  Medication Sig Dispense Refill   acetaminophen  (TYLENOL ) 500 MG tablet Take 500-1,000 mg by mouth every 6 (six) hours as needed (pain.).     Ascorbic Acid (VITAMIN C) 1000 MG tablet Take 1,000 mg by mouth daily.     b complex vitamins tablet Take 1 tablet by mouth daily.     bismuth subsalicylate (PEPTO BISMOL) 262 MG/15ML suspension Take 30 mLs by mouth every 6 (six) hours as needed for diarrhea or loose stools (or upset stomach).     calcium carbonate (TUMS EX) 750 MG chewable tablet Chew 1-3 tablets by mouth as needed for heartburn.     CALCIUM PO Take 2 each by mouth in the morning.     diclofenac Sodium (VOLTAREN) 1 % GEL Apply 2 g topically 4 (four) times daily as needed (for back pain).     ergocalciferol (VITAMIN D2) 1.25 MG (50000 UT) capsule Take 50,000 Units by mouth every Monday.      escitalopram  (LEXAPRO ) 20 MG tablet Take 20 mg by mouth in the morning.     famotidine  (PEPCID ) 40 MG tablet TAKE 1 TABLET(40 MG) BY MOUTH TWICE DAILY 180 tablet 1   ferrous sulfate 325 (65 FE) MG tablet Take 325 mg by mouth every 14 (fourteen) days.     gabapentin  (NEURONTIN ) 300 MG capsule Take 600 mg by mouth 2 (two) times daily.     ketorolac  (TORADOL ) 60 MG/2ML SOLN injection Inject 60 mg into the muscle daily as needed (pain.).     lidocaine  (LIDODERM ) 5 % Place 1 patch onto the skin daily as needed (for pain).     Lidocaine  HCl (ASPERCREME LIDOCAINE  ESSENTIAL EX) Apply 1 Application topically 3 (three) times daily as needed (pain.).     LORazepam  (ATIVAN ) 1 MG  tablet Take 1 mg by mouth in the morning, at noon, and at bedtime.     Multiple Vitamin (MULTIVITAMIN WITH MINERALS) TABS tablet Take 1 tablet by mouth daily.     nortriptyline  (PAMELOR ) 25 MG capsule Take 75 mg by mouth at bedtime.     nystatin cream (MYCOSTATIN) Apply 1 Application topically 2 (two) times daily as needed (irritation (corners of mouth)).     pantoprazole  (PROTONIX ) 40 MG tablet TAKE 1 TABLET(40 MG) BY MOUTH TWICE DAILY 180 tablet 0   pravastatin  (PRAVACHOL ) 80 MG tablet Take 80 mg by mouth in the morning.     promethazine  (PHENERGAN ) 25 MG tablet Take 25 mg by mouth every 6 (six) hours as needed for nausea or vomiting.     tiZANidine  (ZANAFLEX ) 4 MG tablet Take 1 tablet (4 mg total) by mouth every 8 (eight) hours as needed for muscle spasms. (Patient taking differently: Take 2-4 mg by mouth every 8 (eight) hours as needed  for muscle spasms.) 30 tablet 1   traMADol (ULTRAM) 50 MG tablet Take 50-100 mg by mouth 2 (two) times daily as needed (pain.).     zolpidem  (AMBIEN ) 10 MG tablet Take 10 mg by mouth at bedtime as needed for sleep (**MUST BE TORRENT BRAND**).     No current facility-administered medications for this visit.    Allergies as of 08/26/2024 - Review Complete 08/26/2024  Allergen Reaction Noted   Atorvastatin Diarrhea 09/16/2020   Bupropion Nausea And Vomiting 12/11/2023   Duloxetine hcl Nausea And Vomiting 12/11/2023   Ibuprofen Other (See Comments) 09/16/2020   Emelia sprawls officinalis] Palpitations and Other (See Comments) 05/14/2019   Sulfa antibiotics Nausea And Vomiting 05/09/2018   Avelox [moxifloxacin hcl in nacl] Palpitations 05/09/2018   Indocin [indomethacin] Other (See Comments) 05/09/2018    Family History  Problem Relation Age of Onset   Colon cancer Father 64   Stomach cancer Paternal Uncle    Esophageal cancer Neg Hx    Rectal cancer Neg Hx     Social History   Tobacco Use   Smoking status: Never   Smokeless tobacco: Never  Vaping  Use   Vaping status: Never Used  Substance Use Topics   Alcohol use: Not Currently    Comment: rare   Drug use: Never     Review of Systems:    Constitutional: No fever, chills, weakness Cardiovascular: No chest pain Respiratory: No SOB or cough Gastrointestinal: See HPI and otherwise negative   Physical Exam:  Vital signs: BP 134/82 (BP Location: Left Arm, Patient Position: Sitting)   Pulse (!) 116   Ht 5' 0.5 (1.537 m) Comment: height measured without shoes  Wt 132 lb 8 oz (60.1 kg)   LMP  (LMP Unknown)   BMI 25.45 kg/m   Constitutional: Pleasant, well-appearing female in NAD, alert and cooperative Head:  Normocephalic and atraumatic.  Eyes: No scleral icterus.  Respiratory: Respirations even and unlabored. Lungs clear to auscultation bilaterally.  No wheezes, crackles, or rhonchi.  Cardiovascular:  Regular rate and rhythm. No murmurs. No peripheral edema. Gastrointestinal:  Soft, nondistended, nontender. No rebound or guarding. Normal bowel sounds. No appreciable masses or hepatomegaly. Rectal:  Deferred to colonoscopy. Neurologic:  Alert and oriented x4;  grossly normal neurologically.  Skin:   Dry and intact without significant lesions or rashes. Psychiatric: Oriented to person, place and time. Demonstrates good judgement and reason without abnormal affect or behaviors.   RELEVANT LABS AND IMAGING: CBC    Component Value Date/Time   WBC 6.6 08/13/2024 1353   RBC 4.13 08/13/2024 1353   HGB 11.5 (L) 08/13/2024 1353   HCT 36.8 08/13/2024 1353   PLT 332 08/13/2024 1353   MCV 89.1 08/13/2024 1353   MCH 27.8 08/13/2024 1353   MCHC 31.3 08/13/2024 1353   RDW 13.9 08/13/2024 1353   LYMPHSABS 2.0 01/29/2021 1520   MONOABS 0.5 01/29/2021 1520   EOSABS 0.3 01/29/2021 1520   BASOSABS 0.0 01/29/2021 1520    CMP     Component Value Date/Time   NA 136 08/13/2024 1353   K 4.8 08/13/2024 1353   CL 98 08/13/2024 1353   CO2 29 08/13/2024 1353   GLUCOSE 84 08/13/2024  1353   BUN 6 (L) 08/13/2024 1353   CREATININE 0.66 08/13/2024 1353   CALCIUM 10.0 08/13/2024 1353   PROT 7.1 01/29/2021 1520   ALBUMIN 4.4 01/29/2021 1520   AST 28 01/29/2021 1520   ALT 28 01/29/2021 1520   ALKPHOS 76 01/29/2021  1520   BILITOT 0.6 01/29/2021 1520   GFRNONAA >60 08/13/2024 1353   GFRAA >60 05/18/2019 0552     Assessment/Plan:   Family history of colon cancer GERD Encopresis Anemia Patient seen today for annual follow-up.  Previous patient of Dr. Aneita.  History of GERD, esophagitis, suspected gastroparesis, encopresis.  States that she has noticed worsening fecal incontinence over the last year.  Has not had any improvement with fiber.  Some improvement with eliminating lactose from her diet.  Has a bowel movement most days without sensation of incomplete evacuation, but will often pass small amounts of stool without noticing and finds it difficult to get clean after a bowel movement.  Having some urinary urgency and incontinence as well.  Has not previously seen pelvic floor physical therapy for this but is interested. She is due for repeat colonoscopy based on family history of colon cancer.  Last colonoscopy 2019. She does have normocytic anemia on labs in the last year, and does not appear this has been investigated further.  She denies any evidence of overt GI bleeding.  - Schedule colonoscopy, and tentatively add EGD as well based on anemia. I thoroughly discussed the procedure with the patient to include nature of the procedure, alternatives, benefits, and risks (including but not limited to bleeding, infection, perforation, anesthesia/cardiac/pulmonary complications). Patient verbalized understanding and gave verbal consent to proceed with procedure.  - Labs today: CBC, CMP, iron/ferritin, vitamin B12, folate - Keep EGD as scheduled if evidence of iron deficiency anemia - Refer to pelvic floor physical therapy   Camie Furbish, PA-C [facility] 08/26/2024, 2:18 PM  Patient Care Team: [name], MD as PCP - General (Family Medicine)

## 2024-08-26 NOTE — Patient Instructions (Signed)
 You have been scheduled for an endoscopy and colonoscopy. Please follow the written instructions given to you at your visit today.  If you use inhalers (even only as needed), please bring them with you on the day of your procedure.  DO NOT TAKE 7 DAYS PRIOR TO TEST- Trulicity (dulaglutide) Ozempic, Wegovy (semaglutide) Mounjaro (tirzepatide) Bydureon Bcise (exanatide extended release)  DO NOT TAKE 1 DAY PRIOR TO YOUR TEST Rybelsus (semaglutide) Adlyxin (lixisenatide) Victoza (liraglutide) Byetta (exanatide) ___________________________________________________________________________   Your provider has requested that you go to the basement level for lab work before leaving today. Press B on the elevator. The lab is located at the first door on the left as you exit the elevator.   We are referring you to Pelvic Floor Therapy. Their office will call you within 7 days to schedule an appointment.  _______________________________________________________  If your blood pressure at your visit was 140/90 or greater, please contact your primary care physician to follow up on this.  _______________________________________________________  If you are age 44 or older, your body mass index should be between 23-30. Your Body mass index is 25.45 kg/m. If this is out of the aforementioned range listed, please consider follow up with your Primary Care Provider.  If you are age 25 or younger, your body mass index should be between 19-25. Your Body mass index is 25.45 kg/m. If this is out of the aformentioned range listed, please consider follow up with your Primary Care Provider.   ________________________________________________________  The [facility] providers would like to encourage you to use MYCHART to communicate with providers for non-urgent requests or questions.  Due to long hold times on the telephone, sending your provider a message by [facility] may be a faster and more efficient way to  get a response.  Please allow 48 business hours for a response.  Please remember that this is for non-urgent requests.  _______________________________________________________  [facility] is using a team-based approach to care.  Your team is made up of your doctor and two to three APPS. Our APPS (Nurse Practitioners and Physician Assistants) work with your physician to ensure care continuity for you. They are fully qualified to address your health concerns and develop a treatment plan. They communicate directly with your gastroenterologist to care for you. Seeing the Advanced Practice Practitioners on your physician's team can help you by facilitating care more promptly, often allowing for earlier appointments, access to diagnostic testing, procedures, and other specialty referrals.

## 2024-08-27 ENCOUNTER — Ambulatory Visit: Payer: Self-pay | Admitting: Gastroenterology

## 2024-08-28 ENCOUNTER — Telehealth: Payer: Self-pay

## 2024-08-28 NOTE — Telephone Encounter (Signed)
$  60.00 Consult Earlobe L repair, ALL earlobe repair is cosmetic, patients was already made aware that it is cosmetic only for consult and procedure

## 2024-09-10 ENCOUNTER — Encounter (HOSPITAL_COMMUNITY): Admission: RE | Payer: Self-pay | Source: Home / Self Care

## 2024-09-10 ENCOUNTER — Ambulatory Visit (HOSPITAL_COMMUNITY): Admission: RE | Admit: 2024-09-10 | Source: Home / Self Care | Admitting: Orthopaedic Surgery

## 2024-09-10 SURGERY — ARTHROPLASTY, KNEE, TOTAL
Anesthesia: Spinal | Site: Knee | Laterality: Right

## 2024-09-11 ENCOUNTER — Institutional Professional Consult (permissible substitution): Payer: Self-pay

## 2024-09-18 ENCOUNTER — Other Ambulatory Visit: Payer: Self-pay | Admitting: Physical Medicine and Rehabilitation

## 2024-09-18 DIAGNOSIS — M4316 Spondylolisthesis, lumbar region: Secondary | ICD-10-CM

## 2024-09-19 ENCOUNTER — Other Ambulatory Visit: Payer: Self-pay | Admitting: Orthopaedic Surgery

## 2024-09-19 ENCOUNTER — Institutional Professional Consult (permissible substitution): Payer: Self-pay

## 2024-09-27 ENCOUNTER — Encounter (HOSPITAL_COMMUNITY): Admission: RE | Admit: 2024-09-27 | Source: Ambulatory Visit

## 2024-09-27 NOTE — Progress Notes (Signed)
 Date of COVID positive in last 90 days:  PCP - Alm Bilis, MD Cardiologist - Youlanda Juba, MD [phone])  Chest x-ray - 08-13-24 Epic EKG - 08-13-24 Epic Stress Test - N/A ECHO - N/A Cardiac Cath - N/A Pacemaker/ICD device last checked:N/A Spinal Cord Stimulator:N/A  Bowel Prep - N/A  Sleep Study - N/A CPAP -   Prediabetes Fasting Blood Sugar - N/A Checks Blood Sugar _____ times a day  Last dose of GLP1 agonist-  N/A GLP1 instructions:  Do not take after     Last dose of SGLT-2 inhibitors-  N/A SGLT-2 instructions:  Do not take after     Blood Thinner Instructions: N/A Last dose:   Time: Aspirin  Instructions:N/A Last Dose:  Activity level:  Can go up a flight of stairs and perform activities of daily living without stopping and without symptoms of chest pain or shortness of breath.  Able to exercise without symptoms  Unable to go up a flight of stairs without symptoms of     Anesthesia review: Hx of chest pain evaluated by cardiology  Patient denies shortness of breath, fever, cough and chest pain at PAT appointment  Patient verbalized understanding of instructions that were given to them at the PAT appointment. Patient was also instructed that they will need to review over the PAT instructions again at home before surgery.

## 2024-09-27 NOTE — Patient Instructions (Addendum)
 SURGICAL WAITING ROOM VISITATION Patients having surgery or a procedure may have no more than 2 support people in the waiting area - these visitors may rotate.    Children under the age of 62 must have an adult with them who is not the patient.  If the patient needs to stay at the hospital during part of their recovery, the visitor guidelines for inpatient rooms apply. Pre-op nurse will coordinate an appropriate time for 1 support person to accompany patient in pre-op.  This support person may not rotate.    Please refer to the [facility] website for the visitor guidelines for Inpatients (after your surgery is over and you are in a regular room).       Your procedure is scheduled on: 10-08-24   Report to [facility] Main Entrance    Report to admitting at 12:00 PM (noon)   Call this number if you have problems the morning of surgery [phone]   Do not eat food :After Midnight.   After Midnight you may have the following liquids until 11:30 AM DAY OF SURGERY  Water  Non-Citrus Juices (without pulp, NO RED-Apple, White grape, White cranberry) Black Coffee (NO MILK/CREAM OR CREAMERS, sugar ok)  Clear Tea (NO MILK/CREAM OR CREAMERS, sugar ok) regular and decaf                             Plain Jell-O (NO RED)                                           Fruit ices (not with fruit pulp, NO RED)                                     Popsicles (NO RED)                                                               Sports drinks like Gatorade (NO RED)                   The day of surgery:  Drink ONE (1) Pre-Surgery Clear Ensure by 11:30 AM the morning of surgery. Drink in one sitting. Do not sip.  This drink was given to you during your hospital  pre-op appointment visit. Nothing else to drink after completing the Pre-Surgery Clear Ensure.          If you have questions, please contact your surgeon's office.   FOLLOW  ANY ADDITIONAL PRE OP INSTRUCTIONS YOU RECEIVED FROM YOUR  SURGEON'S OFFICE!!!     Oral Hygiene is also important to reduce your risk of infection.                                    Remember - BRUSH YOUR TEETH THE MORNING OF SURGERY WITH YOUR REGULAR TOOTHPASTE   Do NOT smoke after Midnight   Take these medicines the morning of surgery with A SIP OF WATER :    Amlodipine  Escitalopram    Pepcid    Gabapentin    Pantoprazole    Pravastatin    If needed Tylenol , Lorazepam , Tramadol  Stop all vitamins and herbal supplements 7 days before surgery  Bring CPAP mask and tubing day of surgery.                              You may not have any metal on your body including hair pins, jewelry, and body piercing             Do not wear make-up, lotions, powders, perfumes, or deodorant  Do not wear nail polish including gel and S&S, artificial/acrylic nails, or any other type of covering on natural nails including finger and toenails. If you have artificial nails, gel coating, etc. that needs to be removed by a nail salon please have this removed prior to surgery or surgery may need to be canceled/ delayed if the surgeon/ anesthesia feels like they are unable to be safely monitored.   Do not shave  48 hours prior to surgery.           Do not bring valuables to the hospital. [facility] IS NOT RESPONSIBLE   FOR VALUABLES.   Contacts, dentures or bridgework may not be worn into surgery.   DO NOT BRING YOUR HOME MEDICATIONS TO THE HOSPITAL. PHARMACY WILL DISPENSE MEDICATIONS LISTED ON YOUR MEDICATION LIST TO YOU DURING YOUR ADMISSION IN THE HOSPITAL!    Patients discharged on the day of surgery will not be allowed to drive home.  Someone NEEDS to stay with you for the first 24 hours after anesthesia.   Special Instructions: Bring a copy of your healthcare power of attorney and living will documents the day of surgery if you haven't scanned them before.              Please read over the following fact sheets you were given: IF YOU HAVE QUESTIONS ABOUT  YOUR PRE-OP INSTRUCTIONS PLEASE CALL [phone] [facility]  If you received a COVID test during your pre-op visit  it is requested that you wear a mask when out in public, stay away from anyone that may not be feeling well and notify your surgeon if you develop symptoms. If you test positive for Covid or have been in contact with anyone that has tested positive in the last 10 days please notify you surgeon.   Pre-operative 4 CHG [facility] Instructions  DYNA-Hex 4 Chlorhexidine  Gluconate 4% Solution Antiseptic 4 fl. oz   You can play a key role in reducing the risk of infection after surgery. Your skin needs to be as free of germs as possible. You can reduce the number of germs on your skin by washing with CHG (chlorhexidine  gluconate) soap before surgery. CHG is an antiseptic soap that kills germs and continues to kill germs even after washing.   DO NOT use if you have an allergy to chlorhexidine /CHG or antibacterial soaps. If your skin becomes reddened or irritated, stop using the CHG and notify one of our RNs at   Please shower with the CHG soap starting 4 days before surgery using the following schedule:     Please keep in mind the following:  DO NOT shave, including legs and underarms, starting the day of your first shower.   You may shave your face at any point before/day of surgery.  Place clean sheets on your bed the day you start using CHG soap. Use a clean  washcloth (not used since being washed) for each shower. DO NOT sleep with pets once you start using the CHG.  CHG Shower Instructions:  If you choose to wash your hair and private area, wash first with your normal shampoo/soap.  After you use shampoo/soap, rinse your hair and body thoroughly to remove shampoo/soap residue.  Turn the water  OFF and apply about 3 tablespoons (45 ml) of CHG soap to a CLEAN washcloth.  Apply CHG soap ONLY FROM YOUR NECK DOWN TO YOUR TOES (washing for 3-5 minutes)  DO NOT use CHG soap on face, private areas,  open wounds, or sores.  Pay special attention to the area where your surgery is being performed.  If you are having back surgery, having someone wash your back for you may be helpful. Wait 2 minutes after CHG soap is applied, then you may rinse off the CHG soap.  Pat dry with a clean towel  Put on clean clothes/pajamas   If you choose to wear lotion, please use ONLY the CHG-compatible lotions on the back of this paper.     Additional instructions for the day of surgery: DO NOT APPLY any lotions, deodorants, cologne, or perfumes.   Put on clean/comfortable clothes.  Brush your teeth.  Ask your nurse before applying any prescription medications to the skin.   CHG Compatible Lotions   Aveeno Moisturizing lotion  Cetaphil Moisturizing Cream  Cetaphil Moisturizing Lotion  Clairol Herbal Essence Moisturizing Lotion, Dry Skin  Clairol Herbal Essence Moisturizing Lotion, Extra Dry Skin  Clairol Herbal Essence Moisturizing Lotion, Normal Skin  Curel Age Defying Therapeutic Moisturizing Lotion with Alpha Hydroxy  Curel Extreme Care Body Lotion  Curel Soothing Hands Moisturizing Hand Lotion  Curel Therapeutic Moisturizing Cream, Fragrance-Free  Curel Therapeutic Moisturizing Lotion, Fragrance-Free  Curel Therapeutic Moisturizing Lotion, Original Formula  Eucerin Daily Replenishing Lotion  Eucerin Dry Skin Therapy Plus Alpha Hydroxy Crme  Eucerin Dry Skin Therapy Plus Alpha Hydroxy Lotion  Eucerin Original Crme  Eucerin Original Lotion  Eucerin Plus Crme Eucerin Plus Lotion  Eucerin TriLipid Replenishing Lotion  Keri Anti-Bacterial Hand Lotion  Keri Deep Conditioning Original Lotion Dry Skin Formula Softly Scented  Keri Deep Conditioning Original Lotion, Fragrance Free Sensitive Skin Formula  Keri Lotion Fast Absorbing Fragrance Free Sensitive Skin Formula  Keri Lotion Fast Absorbing Softly Scented Dry Skin Formula  Keri Original Lotion  Keri Skin Renewal Lotion Keri Silky Smooth  Lotion  Keri Silky Smooth Sensitive Skin Lotion  Nivea Body Creamy Conditioning Oil  Nivea Body Extra Enriched Lotion  Nivea Body Original Lotion  Nivea Body Sheer Moisturizing Lotion Nivea Crme  Nivea Skin Firming Lotion  NutraDerm 30 Skin Lotion  NutraDerm Skin Lotion  NutraDerm Therapeutic Skin Cream  NutraDerm Therapeutic Skin Lotion  ProShield Protective Hand Cream  Provon moisturizing lotion   PATIENT SIGNATURE_________________________________  NURSE SIGNATURE__________________________________  ________________________________________________________________________    Nasario Exon  An incentive spirometer is a tool that can help keep your lungs clear and active. This tool measures how well you are filling your lungs with each breath. Taking long deep breaths may help reverse or decrease the chance of developing breathing (pulmonary) problems (especially infection) following: A long period of time when you are unable to move or be active. BEFORE THE PROCEDURE  If the spirometer includes an indicator to show your best effort, your nurse or respiratory therapist will set it to a desired goal. If possible, sit up straight or lean slightly forward. Try not to slouch. Hold the incentive spirometer  in an upright position. INSTRUCTIONS FOR USE  Sit on the edge of your bed if possible, or sit up as far as you can in bed or on a chair. Hold the incentive spirometer in an upright position. Breathe out normally. Place the mouthpiece in your mouth and seal your lips tightly around it. Breathe in slowly and as deeply as possible, raising the piston or the ball toward the top of the column. Hold your breath for 3-5 seconds or for as long as possible. Allow the piston or ball to fall to the bottom of the column. Remove the mouthpiece from your mouth and breathe out normally. Rest for a few seconds and repeat Steps 1 through 7 at least 10 times every 1-2 hours when you are awake.  Take your time and take a few normal breaths between deep breaths. The spirometer may include an indicator to show your best effort. Use the indicator as a goal to work toward during each repetition. After each set of 10 deep breaths, practice coughing to be sure your lungs are clear. If you have an incision (the cut made at the time of surgery), support your incision when coughing by placing a pillow or rolled up towels firmly against it. Once you are able to get out of bed, walk around indoors and cough well. You may stop using the incentive spirometer when instructed by your caregiver.  RISKS AND COMPLICATIONS Take your time so you do not get dizzy or light-headed. If you are in pain, you may need to take or ask for pain medication before doing incentive spirometry. It is harder to take a deep breath if you are having pain. AFTER USE Rest and breathe slowly and easily. It can be helpful to keep track of a log of your progress. Your caregiver can provide you with a simple table to help with this. If you are using the spirometer at home, follow these instructions: SEEK MEDICAL CARE IF:  You are having difficultly using the spirometer. You have trouble using the spirometer as often as instructed. Your pain medication is not giving enough relief while using the spirometer. You develop fever of 100.5 F (38.1 C) or higher. SEEK IMMEDIATE MEDICAL CARE IF:  You cough up bloody sputum that had not been present before. You develop fever of 102 F (38.9 C) or greater. You develop worsening pain at or near the incision site. MAKE SURE YOU:  Understand these instructions. Will watch your condition. Will get help right away if you are not doing well or get worse. Document Released: 03/06/2007 Document Revised: 01/16/2012 Document Reviewed: 05/07/2007 [facility] Patient Information 2014 [place], [place].

## 2024-10-01 NOTE — Discharge Instructions (Addendum)
    Thank you for visiting [facility] today. Myelogram Discharge Instructions  Go home and rest quietly as needed. You may resume normal activities; however, do not exert yourself strongly or do any heavy lifting today and tomorrow.   DO NOT drive today.    You may resume your normal diet and medications unless otherwise indicated. Drink lots of extra fluids today and tomorrow.   The incidence of headache, nausea, or vomiting is about 5% (one in 20 patients).  If you develop a headache, lie flat for 24 hours and drink plenty of fluids until the headache goes away.  Caffeinated beverages may be helpful. If when you get up you still have a headache when standing, go back to bed and force fluids for another 24 hours.   If you develop severe nausea and vomiting or a headache that does not go away with the flat bedrest after 48 hours, please call [phone].   Call your physician for a follow-up appointment.  The results of your myelogram will be sent directly to your physician by the following day.  If you have any questions or if complications develop after you arrive home, please call [phone].  Discharge instructions have been explained to the patient.  The patient, or the person responsible for the patient, fully understands these instructions.   Thank you for visiting our office today.

## 2024-10-02 ENCOUNTER — Encounter (HOSPITAL_COMMUNITY): Payer: Self-pay

## 2024-10-02 ENCOUNTER — Encounter (HOSPITAL_COMMUNITY)
Admission: RE | Admit: 2024-10-02 | Discharge: 2024-10-02 | Disposition: A | Discharge status: Home / Self Care | Source: Ambulatory Visit | Attending: Orthopaedic Surgery | Admitting: Orthopaedic Surgery

## 2024-10-02 DIAGNOSIS — Z01818 Encounter for other preprocedural examination: Secondary | ICD-10-CM

## 2024-10-07 ENCOUNTER — Other Ambulatory Visit

## 2024-10-07 ENCOUNTER — Inpatient Hospital Stay
Admission: RE | Admit: 2024-10-07 | Discharge: 2024-10-07 | Disposition: A | Discharge status: Home / Self Care | Source: Ambulatory Visit | Attending: Neurosurgery | Admitting: Neurosurgery

## 2024-10-08 ENCOUNTER — Ambulatory Visit (HOSPITAL_COMMUNITY): Admit: 2024-10-08 | Admitting: Orthopaedic Surgery

## 2024-10-08 SURGERY — ARTHROPLASTY, KNEE, TOTAL
Anesthesia: Spinal | Site: Knee | Laterality: Right

## 2024-10-14 ENCOUNTER — Telehealth: Payer: Self-pay | Admitting: Gastroenterology

## 2024-10-14 MED ORDER — NA SULFATE-K SULFATE-MG SULF 17.5-3.13-1.6 GM/177ML PO SOLN: 1.0000 | Freq: Once | ORAL | 0 refills | Status: AC

## 2024-10-14 NOTE — Telephone Encounter (Signed)
 Pt states she ate about 2 cups of popcorn yesterday. Advised to drink plenty of fluids. She asked if she could drink her almond milk and I informed her she cannot have any milk products, that includes plant based milk products. She states understanding. Prep resent to pharmacy.

## 2024-10-14 NOTE — Telephone Encounter (Signed)
 Patient called stating she had not yet received prep prescription at pharmacy. Also states she ate popcorn yesterday. Requesting a call back. Please advise, thank you.

## 2024-10-15 ENCOUNTER — Encounter: Payer: Self-pay | Admitting: Gastroenterology

## 2024-10-15 ENCOUNTER — Ambulatory Visit: Admitting: Gastroenterology

## 2024-10-15 VITALS — BP 143/84 | HR 86 | Temp 97.8°F | Resp 17 | Ht 60.5 in | Wt 132.8 lb

## 2024-10-15 DIAGNOSIS — K219 Gastro-esophageal reflux disease without esophagitis: Secondary | ICD-10-CM | POA: Diagnosis not present

## 2024-10-15 DIAGNOSIS — D122 Benign neoplasm of ascending colon: Secondary | ICD-10-CM

## 2024-10-15 DIAGNOSIS — Z800 Family history of malignant neoplasm of digestive organs: Secondary | ICD-10-CM

## 2024-10-15 DIAGNOSIS — K319 Disease of stomach and duodenum, unspecified: Secondary | ICD-10-CM | POA: Diagnosis not present

## 2024-10-15 MED ORDER — SODIUM CHLORIDE 0.9 % IV SOLN: 500.0000 mL | Freq: Once | INTRAVENOUS | Status: AC

## 2024-10-15 NOTE — Patient Instructions (Addendum)
 YOU HAD AN ENDOSCOPIC PROCEDURE TODAY AT THE [facility]:   Refer to the procedure report that was given to you for any specific questions about what was found during the examination.  If the procedure report does not answer your questions, please call your gastroenterologist to clarify.  If you requested that your care partner not be given the details of your procedure findings, then the procedure report has been included in a sealed envelope for you to review at your convenience later.  YOU SHOULD EXPECT: Some feelings of bloating in the abdomen. Passage of more gas than usual.  Walking can help get rid of the air that was put into your GI tract during the procedure and reduce the bloating. If you had a lower endoscopy (such as a colonoscopy or flexible sigmoidoscopy) you may notice spotting of blood in your stool or on the toilet paper. If you underwent a bowel prep for your procedure, you may not have a normal bowel movement for a few days.  Please Note:  You might notice some irritation and congestion in your nose or some drainage.  This is from the oxygen used during your procedure.  There is no need for concern and it should clear up in a day or so.  SYMPTOMS TO REPORT IMMEDIATELY:  Following lower endoscopy (colonoscopy or flexible sigmoidoscopy):  Excessive amounts of blood in the stool  Significant tenderness or worsening of abdominal pains  Swelling of the abdomen that is new, acute  Fever of 100F or higher  Following upper endoscopy (EGD)  Vomiting of blood or coffee ground material  New chest pain or pain under the shoulder blades  Painful or persistently difficult swallowing  New shortness of breath  Fever of 100F or higher  Black, tarry-looking stools  For urgent or emergent issues, a gastroenterologist can be reached at any hour by calling [phone]. Do not use MyChart messaging for urgent concerns.    DIET:  We do recommend a small meal at first, but  then you may proceed to your regular diet.  Drink plenty of fluids but you should avoid alcoholic beverages for 24 hours.  MEDICATIONS: Continue present medications.  FOLLOW UP: Await pathology results. Repeat colonoscopy date to be determined after pending pathology results are reviewed for surveillance based on pathology results.  Educational handouts given to patient: Polyps, Diverticulosis, Hemorrhoids, Gastritis, Anti-Reflux Regimen.  Thank you for allowing us  to provide for your healthcare needs today.  ACTIVITY:  You should plan to take it easy for the rest of today and you should NOT DRIVE or use heavy machinery until tomorrow (because of the sedation medicines used during the test).    FOLLOW UP: Our staff will call the number listed on your records the next business day following your procedure.  We will call around 7:15- 8:00 am to check on you and address any questions or concerns that you may have regarding the information given to you following your procedure. If we do not reach you, we will leave a message.     If any biopsies were taken you will be contacted by phone or by letter within the next 1-3 weeks.  Please call us  at [phone] if you have not heard about the biopsies in 3 weeks.    SIGNATURES/CONFIDENTIALITY: You and/or your care partner have signed paperwork which will be entered into your electronic medical record.  These signatures attest to the fact that that the information above on your After Visit  Summary has been reviewed and is understood.  Full responsibility of the confidentiality of this discharge information lies with you and/or your care-partner.

## 2024-10-15 NOTE — Progress Notes (Unsigned)
 Vss nad trans to pacu

## 2024-10-15 NOTE — Progress Notes (Signed)
 Called to room to assist during endoscopic procedure.  Patient ID and intended procedure confirmed with present staff. Received instructions for my participation in the procedure from the performing physician.

## 2024-10-15 NOTE — Progress Notes (Unsigned)
 Pt's states no medical or surgical changes since previsit or office visit.

## 2024-10-15 NOTE — Progress Notes (Unsigned)
 [facility] History and Physical   Primary Care Physician:  [name], MD   Reason for Procedure:  Family h/o colon cancer and GERD with erosive esophagitis  Plan:    EGD and colonoscopy with possible interventions as needed     HPI: Tanya Duncan is a very pleasant 78 y.o. female here for colonoscopy for colon cancer screening for family h/o colon cancer and EGD for GERD with erosive esophagitis. Please refer to office visit by Camie Furbish for details.   The risks and benefits as well as alternatives of endoscopic procedure(s) have been discussed and reviewed.  The patient was provided an opportunity to ask questions and all were answered. The patient agreed with the plan and demonstrated an understanding of the instructions.   Past Medical History:  Diagnosis Date   Anxiety    Arthritis    Blood transfusion without reported diagnosis 05/2019   with hip surgery   Complication of anesthesia    Fibromyalgia    Fractured pelvis (HCC) 09/15/2020   right   GERD (gastroesophageal reflux disease)    GI bleed 8111,8009   x2   Hyperlipidemia    Palpitations    Pneumonia 2005   PONV (postoperative nausea and vomiting)    Pre-diabetes    Stomach ulcer 1990    Past Surgical History:  Procedure Laterality Date   ABDOMINAL HYSTERECTOMY     FRACTURE SURGERY Right 1998   ankle   JOINT REPLACEMENT     LUMBAR FUSION  2015   OPEN REDUCTION INTERNAL FIXATION (ORIF) PROXIMAL PHALANX Right 09/21/2020   Procedure: OPEN TREATMENT OF RIGHT LONG FINGER PROXIMAL PHALANX FRACTURE;  Surgeon: Sebastian Lenis, MD;  Location: [facility];  Service: Orthopedics;  Laterality: Right;  LENGTH OF SURGERY: 75 MIN   ORIF HUMERUS FRACTURE Left    ORIF HUMERUS FRACTURE Left 05/15/2023   Procedure: OPEN REDUCTION INTERNAL FIXATION (ORIF) PERIPROSTHETIC HUMERUS FRACTURE WITH PLATE REMOVAL;  Surgeon: Dozier Soulier, MD;  Location: [facility];  Service: Orthopedics;  Laterality: Left;   ORIF TIBIA  FRACTURE Right 1998   with bone graft   PYLOROPLASTY     REVERSE SHOULDER ARTHROPLASTY Left 02/04/2021   Procedure: REVERSE SHOULDER ARTHROPLASTY WITH HARDWARE REMOVAL;  Surgeon: Dozier Soulier, MD;  Location: [facility];  Service: Orthopedics;  Laterality: Left;   TOTAL HIP ARTHROPLASTY  2011   TOTAL HIP ARTHROPLASTY Right 05/14/2019   Procedure: Right Anterior Hip Arthroplasty;  Surgeon: Sheril Coy, MD;  Location: [facility];  Service: Orthopedics;  Laterality: Right;   TOTAL SHOULDER REPLACEMENT  2014    Prior to Admission medications   Medication Sig Start Date End Date Taking? Authorizing Provider  acetaminophen  (TYLENOL ) 500 MG tablet Take 500-1,000 mg by mouth every 6 (six) hours as needed for moderate pain (pain score 4-6).   Yes [provider]  amLODipine (NORVASC) 5 MG tablet Take 5 mg by mouth daily. 08/27/24  Yes [provider]  Ascorbic Acid (VITAMIN C) 1000 MG tablet Take 1,000 mg by mouth in the morning and at bedtime.   Yes [provider]  b complex vitamins tablet Take 1 tablet by mouth daily.   Yes [provider]  calcium carbonate (TUMS EX) 750 MG chewable tablet Chew 1-3 tablets by mouth as needed for heartburn.   Yes [provider]  CALCIUM PO Take 2 each by mouth in the morning.   Yes [provider]  ergocalciferol (VITAMIN D2) 1.25 MG (50000 UT) capsule Take 50,000 Units by mouth  once a week. 08/26/20  Yes [provider]  gabapentin  (NEURONTIN ) 300 MG capsule Take 900 mg by mouth 2 (two) times daily.   Yes [provider]  lidocaine  (LIDODERM ) 5 % Place 1 patch onto the skin daily as needed (for pain).   Yes [provider]  Lidocaine  HCl (ASPERCREME LIDOCAINE  ESSENTIAL EX) Apply 1 Application topically 3 (three) times daily as needed (pain.).   Yes [provider]  LORazepam  (ATIVAN ) 1 MG tablet Take 1 mg by mouth every 8 (eight) hours as needed for anxiety.   Yes [provider]  losartan (COZAAR) 25 MG tablet Take 25 mg by mouth. 10/09/24  Yes [provider]  Multiple Vitamin (MULTIVITAMIN WITH MINERALS) TABS tablet Take 1 tablet by mouth daily.   Yes [provider]  nortriptyline  (PAMELOR ) 25 MG capsule Take 75 mg by mouth at bedtime.   Yes [provider]  oxyCODONE -acetaminophen  (PERCOCET) 7.5-325 MG tablet Take 1 tablet by mouth. 10/09/24 11/08/24 Yes [provider]  pantoprazole  (PROTONIX ) 40 MG tablet TAKE 1 TABLET(40 MG) BY MOUTH TWICE DAILY 11/06/23  Yes Aneita Gwendlyn DASEN, MD  pravastatin  (PRAVACHOL ) 80 MG tablet Take 80 mg by mouth in the morning.   Yes [provider]  predniSONE (DELTASONE) 10 MG tablet 6,5,4,3,2,1 tablets daily 10/14/24 10/21/24 Yes [provider]  tiZANidine  (ZANAFLEX ) 4 MG tablet Take 1 tablet (4 mg total) by mouth every 8 (eight) hours as needed for muscle spasms. Patient taking differently: Take 2-4 mg by mouth every 8 (eight) hours as needed for muscle spasms. 02/04/21  Yes Laliberte, Danielle, PA-C  traMADol (ULTRAM) 50 MG tablet Take 50-100 mg by mouth every 6 (six) hours as needed.   Yes [provider]  zolpidem  (AMBIEN ) 10 MG tablet Take 10 mg by mouth at bedtime as needed for sleep (**MUST BE TORRENT BRAND**).   Yes [provider]  escitalopram  (LEXAPRO ) 20 MG tablet Take 20 mg by mouth in the morning.    [provider]  famotidine  (PEPCID ) 40 MG tablet TAKE 1 TABLET(40 MG) BY MOUTH TWICE DAILY 07/12/23   Aneita Gwendlyn DASEN, MD  ketorolac  (TORADOL ) 60 MG/2ML SOLN injection Inject 60 mg into the muscle daily as needed (pain.).    [provider]  nystatin cream (MYCOSTATIN) Apply 1 Application topically 2 (two) times daily as needed (irritation (corners of mouth)).    [provider]    Current Outpatient Medications  Medication Sig Dispense Refill   acetaminophen  (TYLENOL ) 500 MG tablet Take 500-1,000 mg by mouth every 6  (six) hours as needed for moderate pain (pain score 4-6).     amLODipine (NORVASC) 5 MG tablet Take 5 mg by mouth daily.     Ascorbic Acid (VITAMIN C) 1000 MG tablet Take 1,000 mg by mouth in the morning and at bedtime.     b complex vitamins tablet Take 1 tablet by mouth daily.     calcium carbonate (TUMS EX) 750 MG chewable tablet Chew 1-3 tablets by mouth as needed for heartburn.     CALCIUM PO Take 2 each by mouth in the morning.     ergocalciferol (VITAMIN D2) 1.25 MG (50000 UT) capsule Take 50,000 Units by mouth once a week.     gabapentin  (NEURONTIN ) 300 MG capsule Take 900 mg by mouth 2 (two) times daily.     lidocaine  (LIDODERM ) 5 % Place 1 patch onto the skin daily as needed (for pain).     Lidocaine  HCl (  ASPERCREME LIDOCAINE  ESSENTIAL EX) Apply 1 Application topically 3 (three) times daily as needed (pain.).     LORazepam  (ATIVAN ) 1 MG tablet Take 1 mg by mouth every 8 (eight) hours as needed for anxiety.     losartan (COZAAR) 25 MG tablet Take 25 mg by mouth.     Multiple Vitamin (MULTIVITAMIN WITH MINERALS) TABS tablet Take 1 tablet by mouth daily.     nortriptyline  (PAMELOR ) 25 MG capsule Take 75 mg by mouth at bedtime.     oxyCODONE -acetaminophen  (PERCOCET) 7.5-325 MG tablet Take 1 tablet by mouth.     pantoprazole  (PROTONIX ) 40 MG tablet TAKE 1 TABLET(40 MG) BY MOUTH TWICE DAILY 180 tablet 0   pravastatin  (PRAVACHOL ) 80 MG tablet Take 80 mg by mouth in the morning.     predniSONE (DELTASONE) 10 MG tablet 6,5,4,3,2,1 tablets daily     tiZANidine  (ZANAFLEX ) 4 MG tablet Take 1 tablet (4 mg total) by mouth every 8 (eight) hours as needed for muscle spasms. (Patient taking differently: Take 2-4 mg by mouth every 8 (eight) hours as needed for muscle spasms.) 30 tablet 1   traMADol (ULTRAM) 50 MG tablet Take 50-100 mg by mouth every 6 (six) hours as needed.     zolpidem  (AMBIEN ) 10 MG tablet Take 10 mg by mouth at bedtime as needed for sleep (**MUST BE TORRENT BRAND**).      escitalopram  (LEXAPRO ) 20 MG tablet Take 20 mg by mouth in the morning.     famotidine  (PEPCID ) 40 MG tablet TAKE 1 TABLET(40 MG) BY MOUTH TWICE DAILY 180 tablet 1   ketorolac  (TORADOL ) 60 MG/2ML SOLN injection Inject 60 mg into the muscle daily as needed (pain.).     nystatin cream (MYCOSTATIN) Apply 1 Application topically 2 (two) times daily as needed (irritation (corners of mouth)).     Current Facility-Administered Medications  Medication Dose Route Frequency Provider Last Rate Last Admin   0.9 %  sodium chloride  infusion  500 mL Intravenous Once Satoria Dunlop V, MD        Allergies as of 10/15/2024 - Review Complete 10/15/2024  Allergen Reaction Noted   Atorvastatin Diarrhea 09/16/2020   Bupropion Nausea And Vomiting 12/11/2023   Duloxetine hcl Nausea And Vomiting 12/11/2023   Ibuprofen Other (See Comments) 09/16/2020   Emelia sprawls officinalis] Palpitations and Other (See Comments) 05/14/2019   Sulfa antibiotics Nausea And Vomiting 05/09/2018   Avelox [moxifloxacin hcl in nacl] Palpitations 05/09/2018   Indocin [indomethacin] Other (See Comments) 05/09/2018    Family History  Problem Relation Age of Onset   Colon cancer Father 58   Stomach cancer Paternal Uncle    Esophageal cancer Neg Hx    Rectal cancer Neg Hx     Social History   Socioeconomic History   Marital status: Divorced    Spouse name: Not on file   Number of children: 2   Years of education: Not on file   Highest education level: Not on file  Occupational History   Occupation: Retired  Tobacco Use   Smoking status: Never   Smokeless tobacco: Never  Vaping Use   Vaping status: Never Used  Substance and Sexual Activity   Alcohol use: Not Currently    Comment: rare   Drug use: Never   Sexual activity: Not Currently    Birth control/protection: Surgical    Comment: Hysterectomy  Other Topics Concern   Not on file  Social History Narrative   Not on file   Social Drivers of Health  Financial Resource Strain: High Risk (02/01/2024)   Received from [facility]   Overall Financial Resource Strain (CARDIA)    Difficulty of Paying Living Expenses: Hard  Food Insecurity: No Food Insecurity (02/01/2024)   Received from [facility]   Hunger Vital Sign    Within the past 12 months, you worried that your food would run out before you got the money to buy more.: Never true    Within the past 12 months, the food you bought just didn't last and you didn't have money to get more.: Never true  Transportation Needs: No Transportation Needs (02/01/2024)   Received from [facility] - Transportation    Lack of Transportation (Medical): No    Lack of Transportation (Non-Medical): No  Physical Activity: Insufficiently Active (02/01/2024)   Received from [facility]   Exercise Vital Sign    On average, how many days per week do you engage in moderate to strenuous exercise (like a brisk walk)?: 2 days    On average, how many minutes do you engage in exercise at this level?: 10 min  Stress: No Stress Concern Present (02/01/2024)   Received from [facility] - Occupational Stress Questionnaire    Feeling of Stress : Only a little  Social Connections: Moderately Integrated (02/01/2024)   Received from [facility]   Social Network    How would you rate your social network (family, work, friends)?: Adequate participation with social networks  Intimate Partner Violence: Not At Risk (02/01/2024)   Received from [facility]   HITS    Over the last 12 months how often did your partner physically hurt you?: Never    Over the last 12 months how often did your partner insult you or talk down to you?: Never    Over the last 12 months how often did your partner threaten you with physical harm?: Never    Over the last 12 months how often did your partner scream or curse at you?: Never    Review of Systems:  All other review of  systems negative except as mentioned in the HPI.  Physical Exam: Vital signs in last 24 hours: BP (!) 148/106   Pulse 100   Temp 97.8 F (36.6 C)   Ht 5' 0.5 (1.537 m)   Wt 132 lb 12.8 oz (60.2 kg)   LMP  (LMP Unknown)   SpO2 97%   BMI 25.51 kg/m  General:   Alert, NAD Lungs:  Clear .   Heart:  Regular rate and rhythm Abdomen:  Soft, nontender and nondistended. Neuro/Psych:  Alert and cooperative. Normal mood and affect. A and O x 3  Reviewed labs, radiology imaging, old records and pertinent past GI work up  Patient is appropriate for planned procedure(s) and anesthesia in an ambulatory setting   K. Veena Tobyn Osgood , MD [phone]

## 2024-10-15 NOTE — Op Note (Signed)
 [facility] Patient Name: Tanya Duncan Procedure Date: 10/15/2024 1:46 PM MRN: [id number] Endoscopist: Gustav ALONSO Mcgee , MD, [id number] Age: 78 Referring MD:  Date of Birth: 1946-01-04 Gender: Female Account #: [id number] Procedure:                Colonoscopy Indications:              Screening in patient at increased risk: Family                            history of 1st-degree relative with colorectal                            cancer Medicines:                Monitored Anesthesia Care Procedure:                Pre-Anesthesia Assessment:                           - Prior to the procedure, a History and Physical                            was performed, and patient medications and                            allergies were reviewed. The patient's tolerance of                            previous anesthesia was also reviewed. The risks                            and benefits of the procedure and the sedation                            options and risks were discussed with the patient.                            All questions were answered, and informed consent                            was obtained. Prior Anticoagulants: The patient has                            taken no anticoagulant or antiplatelet agents. ASA                            Grade Assessment: III - A patient with severe                            systemic disease. After reviewing the risks and                            benefits, the patient was deemed in satisfactory  condition to undergo the procedure.                           After obtaining informed consent, the colonoscope                            was passed under direct vision. Throughout the                            procedure, the patient's blood pressure, pulse, and                            oxygen saturations were monitored continuously. The                            [id number]-[id number] Colonoscope [id number] was introduced                             through the anus and advanced to the the cecum,                            identified by appendiceal orifice and ileocecal                            valve. The colonoscopy was performed without                            difficulty. The patient tolerated the procedure                            well. The quality of the bowel preparation was                            adequate. The ileocecal valve, appendiceal orifice,                            and rectum were photographed. Scope In: 2:12:46 PM Scope Out: 2:33:46 PM Scope Withdrawal Time: 0 hours 13 minutes 35 seconds  Total Procedure Duration: 0 hours 21 minutes 0 seconds  Findings:                 The perianal and digital rectal examinations were                            normal.                           A 4 mm polyp was found in the ascending colon. The                            polyp was sessile. The polyp was removed with a                            cold snare. Resection and retrieval were complete.  Scattered large-mouthed, medium-mouthed and                            small-mouthed diverticula were found in the sigmoid                            colon, descending colon, transverse colon and                            ascending colon.                           Non-bleeding external and internal hemorrhoids were                            found during retroflexion. The hemorrhoids were                            small.                           A localized area of mildly nodular mucosa was found                            at the anus. Biopsies were taken with a cold                            forceps for histology to exclude AIN. Complications:            No immediate complications. Estimated Blood Loss:     Estimated blood loss was minimal. Impression:               - One 4 mm polyp in the ascending colon, removed                            with a cold snare. Resected and  retrieved.                           - Diverticulosis in the sigmoid colon, in the                            descending colon, in the transverse colon and in                            the ascending colon.                           - Non-bleeding external and internal hemorrhoids.                           - Nodular mucosa at the anus. Biopsied to exclude                            AIN. Recommendation:           - Resume previous diet.                           -  Continue present medications.                           - Await pathology results.                           - Repeat colonoscopy date to be determined after                            pending pathology results are reviewed for                            surveillance based on pathology results. Zyria Fiscus V. Monic Engelmann, MD 10/15/2024 2:43:05 PM This report has been signed electronically.

## 2024-10-15 NOTE — Op Note (Signed)
 [facility] Patient Name: Tanya Duncan Procedure Date: 10/15/2024 1:46 PM MRN: [id number] Endoscopist: Gustav ALONSO Mcgee , MD, [id number] Age: 78 Referring MD:  Date of Birth: 1946/09/19 Gender: Female Account #: [id number] Procedure:                Upper GI endoscopy Indications:              Follow-up of reflux esophagitis, Esophageal reflux                            symptoms that persist despite appropriate therapy Medicines:                Monitored Anesthesia Care Procedure:                Pre-Anesthesia Assessment:                           - Prior to the procedure, a History and Physical                            was performed, and patient medications and                            allergies were reviewed. The patient's tolerance of                            previous anesthesia was also reviewed. The risks                            and benefits of the procedure and the sedation                            options and risks were discussed with the patient.                            All questions were answered, and informed consent                            was obtained. Prior Anticoagulants: The patient has                            taken no anticoagulant or antiplatelet agents. ASA                            Grade Assessment: III - A patient with severe                            systemic disease. After reviewing the risks and                            benefits, the patient was deemed in satisfactory                            condition to undergo the procedure.  After obtaining informed consent, the endoscope was                            passed under direct vision. Throughout the                            procedure, the patient's blood pressure, pulse, and                            oxygen saturations were monitored continuously. The                            GIF HQ190 #[id number] was introduced through the                             mouth, and advanced to the second part of duodenum.                            The upper GI endoscopy was accomplished without                            difficulty. The patient tolerated the procedure                            well. Scope In: Scope Out: Findings:                 LA Grade C (one or more mucosal breaks continuous                            between tops of 2 or more mucosal folds, less than                            75% circumference) esophagitis with no bleeding was                            found 30 to 36 cm from the incisors. Biopsies were                            taken with a cold forceps for histology.                           The Z-line was regular and was found 36 cm from the                            incisors.                           Patchy moderate inflammation characterized by                            congestion (edema), erythema and friability was  found in the entire examined stomach. Biopsies were                            taken with a cold forceps for Helicobacter pylori                            testing.                           The cardia and gastric fundus were normal on                            retroflexion.                           The examined duodenum was normal. Complications:            No immediate complications. Estimated Blood Loss:     Estimated blood loss was minimal. Impression:               - LA Grade C erosive esophagitis with no bleeding.                            Biopsied.                           - Z-line regular, 36 cm from the incisors.                           - Gastritis. Biopsied.                           - Normal examined duodenum. Recommendation:           - Resume previous diet.                           - Continue present medications.                           - Await pathology results.                           - Follow an antireflux regimen. Tanya Asch V. Ebelyn Bohnet, MD 10/15/2024  2:51:14 PM This report has been signed electronically.

## 2024-10-15 NOTE — Telephone Encounter (Signed)
 Patient called stating she is not feeling well. States she is shaky and having pain. Patient is requesting a call back to discuss further for tomorrow's procedure. Please advise, thank you

## 2024-10-15 NOTE — Telephone Encounter (Signed)
 Returned pts call.  She wants to know if she can take her tramadol this morning.  Advised her that she can take it if she takes it right now but to not have any additional liquids after that.  Explained that she cannot have liquids 3 hrs prior to procedure. Pt verbalized understanding.

## 2024-10-16 ENCOUNTER — Telehealth: Payer: Self-pay

## 2024-10-16 NOTE — Telephone Encounter (Signed)
°  Follow up Call-     10/15/2024    1:08 PM 10/17/2022    9:28 AM  Call back number  Post procedure Call Back phone  # [phone]  Permission to leave phone message Yes Yes     Patient questions:  Do you have a fever, pain , or abdominal swelling? No. Pain Score  0 *  Have you tolerated food without any problems? Yes.    Have you been able to return to your normal activities? Yes.    Do you have any questions about your discharge instructions: Diet   No. Medications  No. Follow up visit  No.  Do you have questions or concerns about your Care? No.  Actions: * If pain score is 4 or above: No action needed, pain <4.

## 2024-10-17 NOTE — Discharge Instructions (Signed)

## 2024-10-18 ENCOUNTER — Inpatient Hospital Stay: Admission: RE | Admit: 2024-10-18 | Discharge: 2024-10-18 | Attending: Neurosurgery | Admitting: Neurosurgery

## 2024-10-18 DIAGNOSIS — M4316 Spondylolisthesis, lumbar region: Secondary | ICD-10-CM

## 2024-10-18 LAB — SURGICAL PATHOLOGY

## 2024-10-18 MED ORDER — MEPERIDINE HCL 50 MG/ML IJ SOLN
50.0000 mg | Freq: Once | INTRAMUSCULAR | Status: AC | PRN
  Administered 2024-10-18: 12:00:00 50 mg via INTRAMUSCULAR

## 2024-10-18 MED ORDER — IOPAMIDOL (ISOVUE-M 200) INJECTION 41%
18.0000 mL | Freq: Once | INTRAMUSCULAR | Status: AC
  Administered 2024-10-18: 18 mL via INTRATHECAL

## 2024-10-18 MED ORDER — DIAZEPAM 5 MG PO TABS
5.0000 mg | ORAL_TABLET | Freq: Once | ORAL | Status: AC
  Administered 2024-10-18: 5 mg via ORAL

## 2024-10-18 MED ORDER — ONDANSETRON HCL 4 MG/2ML IJ SOLN: 4.0000 mg | Freq: Once | INTRAMUSCULAR | Status: DC | PRN

## 2024-10-28 NOTE — Progress Notes (Signed)
 10/28/2024  [facility]   Patient ID:  Tanya Duncan is a 78 y.o. (DOB 08-30-1946) female.  Assessment and Plan  Graciella Arment was seen today for hand swelling.  Diagnoses and all orders for this visit:  Paronychia of finger of right hand -     doxycycline hyclate (VIBRAMYCIN) 100 mg capsule; Take one capsule (100 mg dose) by mouth 2 (two) times daily for 14 days.   Assessment & Plan 1. Right index finger infection: - The infection appears to have originated under the nail bed and subsequently drained on top. There is no evidence of fever or significant swelling extending up the finger. - There is no drainage currently, and a culture cannot be performed. - Warm soaks at home and keeping the finger bandaged to prevent further trauma are advised. Tylenol  every 6 hours is recommended for pain management, with the option to alternate with Advil if renal function is normal. - Doxycycline will be prescribed, to be taken twice daily for a duration of 2 weeks. If there is any recurrence of symptoms or pain, an MRI will be considered to evaluate the condition of the finger. She should report any worsening of symptoms, including redness or swelling extending up the hand.  Follow-up: The patient will follow up in 2 weeks for a recheck of the finger.  Follow up in about 2 weeks (around 11/11/2024) for recheck.   Health Maintenance Due  Topic Date Due   Zoster Vaccine (1 of 2) Never done   RSV Adult and Pregnancy (1 - 1-dose 75+ series) Never done   Colorectal Cancer Screening  05/24/2023   COVID-19 Vaccine (6 - 2025-26 season) 07/08/2024     Risks, benefits, and alternatives of the medications and treatment plan prescribed today were discussed, and patient expressed understanding. Plan follow-up as discussed or as needed if any worsening symptoms or change in condition.    A yearly preventative health exam was recommended and current age based recommendations were  discussed.   Subjective   Patient ID:  Tanya Duncan is a 78 y.o. (DOB 1946-02-27) female    Patient presents with   Hand Swelling     History of Present Illness The patient presents for evaluation of a right index finger infection.  She has been experiencing an infection in her right index finger for the past 3 days. The infection was inadvertently exacerbated when she scraped the top of it off while working with wreaths yesterday afternoon, resulting in the release of a significant amount of pus. Additionally, she mentions a winter split on the side of her finger. She reports no recent soil work in the yard. She also reports soreness in the affected finger.  She has a history of osteomyelitis in 1998, which required hospitalization for 2 days and subsequent home care with IV antibiotics.   Current Outpatient Medications  Medication Instructions   acetaminophen  (TYLENOL ) 500 mg, Every 6 hours as needed   B Complex Vitamins (B COMPLEX 1 PO) 1 tablet, Daily   diclofenac sodium (VOLTAREN) 1% GEL Transdermal, 4 times a day as needed   doxycycline hyclate (VIBRAMYCIN) 100 mg, Oral, 2 times a day   ergocalciferol (VITAMIN D2) 50,000 Units, Oral, Weekly   escitalopram  oxalate (LEXAPRO ) 20 mg, Oral, Daily   famotidine  (PEPCID ) 40 mg, Oral, 2 times a day   gabapentin  (NEURONTIN ) 300 mg, 2 times a day   ketoROLAC  tromethamine  (TORADOL ) 60 mg, Intramuscular, Daily as needed   lidocaine  (LIDODERM ) 5% 1 patch,  Transdermal, Every 12 hours, Remove & Discard patch within 12 hours or as directed by MD   LORazepam  (ATIVAN ) 1 mg tablet TAKE 1 TABLET(1 MG) BY MOUTH EVERY 8 HOURS AS NEEDED FOR ANXIETY. MAX DAILY AMOUNT: 3 MG   losartan potassium (COZAAR) 50 mg, Oral, Daily   Misc. Devices MISC Upper arm Adult size blood pressure   Multiple Vitamins-Minerals (MULTI FOR HER PO) 1 tablet, Daily   nortriptyline  HCl (PAMELOR ) 75 mg, At bedtime   nystatin (MYCOSTATIN) cream Topical, 2  times a day   oxyCODONE -acetaminophen  (PERCOCET) 7.5-325 MG per tablet 1 tablet, Oral, 2 times a day   pantoprazole  sodium (PROTONIX ) 40 mg, Oral, 2 times a day, 0   pravastatin  (PRAVACHOL ) 80 mg, Oral, Every evening   predniSONE (DELTASONE) 5 mg, Oral, Daily   predniSONE 10 MG (21) TBPK Take by mouth.   Probiotic Product (PROBIOTIC DAILY PO) 1 capsule, Daily   SYRINGE-NEEDLE, DISP, 3 ML (BD SAFETYGLIDE SYRINGE/NEEDLE) 25G X 1 3 ML MISC Use for toradol  injections   tiZANidine  (ZANAFLEX ) 2 mg, Oral, Every 6 hours as needed   vitamin C 1,000 mg, Daily   zolpidem  tartrate (AMBIEN  CR) 12.5 mg, Oral, At bedtime as needed   Patient Care Team: Comer Baird GAILS, NP as PCP - General (Family Medicine) Dominic LELON Davis, MD (Orthopedic Surgery) Gwendlyn Greg Buddy, MD as Consulting Physician (Gastroenterology) Maude Herald, MD (Orthopedic Surgery) Social History   Tobacco Use   Smoking status: Never    Passive exposure: Past   Smokeless tobacco: Never  Substance Use Topics   Alcohol use: Not Currently    Reviewed and updated this visit by provider: Tobacco  Allergies  Meds  Problems  Med Hx  Surg Hx  Fam Hx       Review of Systems is complete and negative except as noted.  Objective   Vitals:   10/28/24 1342  BP: 132/84  Patient Position: Sitting  Pulse: 100  Temp: 97 F (36.1 C)  TempSrc: Temporal  Resp: 17  Height: 5' 2 (1.575 m)  Weight: 137 lb (62.1 kg)  SpO2: 100%  BMI (Calculated): 25.1   Wt Readings from Last 3 Encounters:  10/28/24 137 lb (62.1 kg)  10/23/24 134 lb (60.8 kg)  09/20/24 134 lb (60.8 kg)    Physical Exam Skin: Right index finger shows signs of previous drainage with a small amount of pus present. No active drainage noted during the exam. The area appears to have been self-drained.   Constitutional: Well-developed and well-nourished.  Sitting comfortably conversing normally.  Eyes: Conjunctivae, lids, and EOM are  normal. Pupils are equal, round, and reactive to light. Lymphatics: There is no anterior or posterior cervical adenopathy. Skin: Resolving paronychia.  No pus expressed from the area today.  Finger range of motion is full.  No lymphangitis or lymphadenopathy Psychiatric: Behavior is Cooperative and Polite. Mood euthymyic. Affect is appropriate.  Computer technology was used to create visit note. Consent from the patient/caregiver was obtained prior to its use.  Alm FORBES Bilis, MD

## 2024-11-01 NOTE — Progress Notes (Signed)
 [facility] Video Visit   Patient ID:  Tanya Duncan is a 78 y.o. (DOB 06/02/1946) female Place of service: patient home Patient has been advised as to the limitations and limited nature of physical exam due to nature of a video visit, the possibility of privacy risk in the use of a video visit, and that the healthcare provider may recommend visiting a healthcare clinic for in-person care and follow up.   Video Visit Assessment and Plan   1. Essential hypertension (Primary) -     losartan potassium (COZAAR) 50 mg tablet; Take one tablet (50 mg dose) by mouth daily., Starting Fri 11/01/2024, Normal    Patient is suboptimally controlled on losartan 25 mg daily.  This is a progression of a chronic problem.  Patient was prescribed losartan 50 mg daily on 10/23/2024 but stated that she was unaware of this prescription and did not have anything at her pharmacy.  Refilled losartan 50 mg daily and instructed patient to increase to this dose.  Instructed patient to continue to take her blood pressure at home and to contact the clinic with any persistent readings greater than 140/90.  Recommended over-the-counter analgesics as needed for pain control.  Otherwise, follow-up with PCP at next routine visit.    Outpatient Encounter Medications as of 11/01/2024:    acetaminophen  (TYLENOL ) 500 mg tablet, Take one tablet (500 mg dose) by mouth every 6 (six) hours as needed for Pain.   Ascorbic Acid (VITAMIN C) 1000 MG tablet, Take one tablet (1,000 mg dose) by mouth daily.   B Complex Vitamins (B COMPLEX 1 PO), Take 1 tablet by mouth daily.   diclofenac sodium (VOLTAREN) 1% GEL, Place onto the skin 4 (four) times a day as needed.   doxycycline hyclate (VIBRAMYCIN) 100 mg capsule, Take one capsule (100 mg dose) by mouth 2 (two) times daily for 14 days.   ergocalciferol (VITAMIN D2) 50,000 units CAPS capsule, Take one capsule (50,000 Units dose) by mouth once a week.   escitalopram  oxalate (LEXAPRO ) 20  mg tablet, Take one tablet (20 mg dose) by mouth daily.   famotidine  (PEPCID ) 40 mg tablet, Take one tablet (40 mg dose) by mouth 2 (two) times daily.   gabapentin  (NEURONTIN ) 300 mg capsule, Take one capsule (300 mg dose) by mouth 2 (two) times daily. Take 3 capsules by mouth twice daily for sciatica.   ketoROLAC  tromethamine  (TORADOL ) 60 mg/2 mL SOLN injection, Inject 2 mLs (60 mg dose) into the muscle daily as needed.   lidocaine  (LIDODERM ) 5%, Place one patch onto the skin every 12 (twelve) hours. Remove & Discard patch within 12 hours or as directed by MD   LORazepam  (ATIVAN ) 1 mg tablet, TAKE 1 TABLET(1 MG) BY MOUTH EVERY 8 HOURS AS NEEDED FOR ANXIETY. MAX DAILY AMOUNT: 3 MG   losartan potassium (COZAAR) 50 mg tablet, Take one tablet (50 mg dose) by mouth daily.   [DISCONTINUED] losartan potassium (COZAAR) 50 mg tablet, Take one tablet (50 mg dose) by mouth daily.   Misc. Devices MISC, Upper arm Adult size blood pressure   Multiple Vitamins-Minerals (MULTI FOR HER PO), Take 1 tablet by mouth daily.   nortriptyline  HCl (PAMELOR ) 25 mg capsule, Take three capsules (75 mg dose) by mouth at bedtime.   nystatin (MYCOSTATIN) cream, Apply topically 2 (two) times daily.   oxyCODONE -acetaminophen  (PERCOCET) 7.5-325 MG per tablet, Take one tablet by mouth 2 (two) times daily for 30 days. Max Daily Amount: 2 tablets   pantoprazole  sodium (PROTONIX ) 40  mg tablet, Take one tablet (40 mg dose) by mouth 2 (two) times daily. 0   pravastatin  (PRAVACHOL ) 80 MG tablet, Take one tablet (80 mg dose) by mouth every evening.   predniSONE (DELTASONE) 5 mg tablet, Take one tablet (5 mg dose) by mouth daily.   predniSONE 10 MG (21) TBPK, Take by mouth.   Probiotic Product (PROBIOTIC DAILY PO), Take 1 capsule by mouth daily.   SYRINGE-NEEDLE, DISP, 3 ML (BD SAFETYGLIDE SYRINGE/NEEDLE) 25G X 1 3 ML MISC, Use for toradol  injections   tiZANidine  (ZANAFLEX ) 4 mg tablet, Take one half tablet (2 mg  dose) by mouth every 6 (six) hours as needed.   zolpidem  tartrate (AMBIEN  CR) 12.5 mg CR tablet, Take one tablet (12.5 mg dose) by mouth at bedtime as needed for Sleep. Max Daily Amount: 12.5 mg  Facility-Administered Encounter Medications as of 11/01/2024:    cyanocobalamin  (VITAMIN B-12) injection 1,000 mcg, 1,000 mcg   Risk, benefits, and alternatives were provided through patient instructions given to the patient electronically and during the video interaction.  If any worsening symptoms or lack of improvement, the patient will seek immediate medical care.   Video Visit History      Patient presents with   Blood Pressure Concerns    Ms. Bouska presents for a video visit with the following blood pressure concerns:  - Elevated blood pressure since October. - Started on amlodipine 5 mg on 08/27/2024. - Increased to amlodipine 10 mg on 10/01/2024.  - Developed bilateral lower extremity swelling. - Discontinued amlodipine and started losartan 25 mg daily on 10/09/2024. - Increased to losartan 50 mg daily on 10/23/2024 but states she has still been taking the 25 mg dose. - States blood pressure at home had been relatively well controlled since. - Headache yesterday.  - BP 158/83 in the morning.  - 143/90 in evening. - Headache this morning.  - BP 174/90. - Denies side effects on losartan.    Reviewed and updated this visit by provider: Tobacco  Allergies  Meds  Problems  Med Hx  Surg Hx  Fam Hx        ROS:  As documented in the history above, all other relevant system complaints were negative.    Video Visit Objective Findings   Examination conducted with the use of video cameras/computer monitors. Vital signs and other aspects of physical exam are limited due to the nature of this encounter.   Constitutional:  No apparent acute distress noted during the video interaction; Alert and oriented with normal mentation and verbally interactive. Mood:  Appears  appropriate to situation.

## 2024-11-02 ENCOUNTER — Other Ambulatory Visit: Payer: Self-pay

## 2024-11-02 ENCOUNTER — Emergency Department (HOSPITAL_COMMUNITY)

## 2024-11-02 ENCOUNTER — Emergency Department (HOSPITAL_COMMUNITY): Admission: EM | Admit: 2024-11-02 | Discharge: 2024-11-02 | Discharge status: Against Medical Advice

## 2024-11-02 ENCOUNTER — Encounter (HOSPITAL_COMMUNITY): Payer: Self-pay

## 2024-11-02 DIAGNOSIS — X58XXXA Exposure to other specified factors, initial encounter: Secondary | ICD-10-CM | POA: Insufficient documentation

## 2024-11-02 DIAGNOSIS — Z5321 Procedure and treatment not carried out due to patient leaving prior to being seen by health care provider: Secondary | ICD-10-CM | POA: Insufficient documentation

## 2024-11-02 DIAGNOSIS — S61200A Unspecified open wound of right index finger without damage to nail, initial encounter: Secondary | ICD-10-CM | POA: Insufficient documentation

## 2024-11-02 LAB — CBC WITH DIFFERENTIAL/PLATELET
Abs Immature Granulocytes: 0.08 K/uL — ABNORMAL HIGH (ref 0.00–0.07)
Basophils Absolute: 0 K/uL (ref 0.0–0.1)
Basophils Relative: 0 %
Eosinophils Absolute: 0.3 K/uL (ref 0.0–0.5)
Eosinophils Relative: 2 %
HCT: 33.1 % — ABNORMAL LOW (ref 36.0–46.0)
Hemoglobin: 10.9 g/dL — ABNORMAL LOW (ref 12.0–15.0)
Immature Granulocytes: 1 %
Lymphocytes Relative: 15 %
Lymphs Abs: 2 K/uL (ref 0.7–4.0)
MCH: 29.8 pg (ref 26.0–34.0)
MCHC: 32.9 g/dL (ref 30.0–36.0)
MCV: 90.4 fL (ref 80.0–100.0)
Monocytes Absolute: 0.8 K/uL (ref 0.1–1.0)
Monocytes Relative: 6 %
Neutro Abs: 10.4 K/uL — ABNORMAL HIGH (ref 1.7–7.7)
Neutrophils Relative %: 76 %
Platelets: 260 K/uL (ref 150–400)
RBC: 3.66 MIL/uL — ABNORMAL LOW (ref 3.87–5.11)
RDW: 13.2 % (ref 11.5–15.5)
WBC: 13.6 K/uL — ABNORMAL HIGH (ref 4.0–10.5)
nRBC: 0 % (ref 0.0–0.2)

## 2024-11-02 LAB — BASIC METABOLIC PANEL WITH GFR
Anion gap: 11 (ref 5–15)
BUN: 9 mg/dL (ref 8–23)
CO2: 25 mmol/L (ref 22–32)
Calcium: 8.9 mg/dL (ref 8.9–10.3)
Chloride: 96 mmol/L — ABNORMAL LOW (ref 98–111)
Creatinine, Ser: 0.67 mg/dL (ref 0.44–1.00)
GFR, Estimated: >60 mL/min
Glucose, Bld: 104 mg/dL — ABNORMAL HIGH (ref 70–99)
Potassium: 3.6 mmol/L (ref 3.5–5.1)
Sodium: 132 mmol/L — ABNORMAL LOW (ref 135–145)

## 2024-11-02 LAB — I-STAT CG4 LACTIC ACID, ED: Lactic Acid, Venous: 1.1 mmol/L (ref 0.5–1.9)

## 2024-11-02 NOTE — ED Triage Notes (Signed)
 Quick triage note: Pt reports wound to right index finger, currently wrapped, evaluated for the same on 12/22, started on doxy, taking as prescribed, reports swelling, pain, and drainage is getting worse.

## 2024-11-02 NOTE — ED Notes (Signed)
 Pt left without being seen due to long ED wait times.

## 2024-11-02 NOTE — ED Notes (Signed)
 Pt states that pain in her hand is getting worse. Triage RN aware.

## 2024-11-02 NOTE — ED Provider Triage Note (Signed)
 Emergency Medicine Provider Triage Evaluation Note  Tanya Duncan , a 78 y.o. female  was evaluated in triage.  Pt complains of wound to right finger.  Was seen by her PCP on 12/22 and diagnosed with a wound on her right finger, reports that she was not bitten/scratched by an animal but that this wound came from a split in her finger which she attributes to the cold weather.  She has been taking doxycycline as prescribed but notes increased pain with an inability to bend the finger, she now has aching in the adjacent finger as well with redness that seems to be spreading down the finger.  No fever at home.  Review of Systems  Positive: As above Negative: As above  Physical Exam  BP (!) 134/91 (BP Location: Left Arm)   Pulse (!) 124   Temp 98.3 F (36.8 C)   Resp 16   LMP  (LMP Unknown)   SpO2 99%  Gen:   Awake, no distress   Resp:  Normal effort  MSK:   Moves extremities without difficulty  Other:  R index finger is bandaged, appreciable redness/warmth noted proximal to bandage, inability to flex at MCP/PIP/DIP secondary to pain  Medical Decision Making  Medically screening exam initiated at 5:22 PM.  Appropriate orders placed.  Tanya Duncan was informed that the remainder of the evaluation will be completed by another provider, this initial triage assessment does not replace that evaluation, and the importance of remaining in the ED until their evaluation is complete.     Tanya Duncan, [place] 11/02/24 1724

## 2024-11-02 NOTE — Telephone Encounter (Signed)
 Right Index Finger wound / infection dx 12/22 (Doxycycline Rx started). Wound to Right Finger Knuckle closest to fingernail. Increase pain / Increase swelling -- pus remains.  Patient states symptoms worse then when evaluated in clinic on 10/28/24,  Afebrile.  Advise to go to ED to have evaluated / treated.  Patient to go to to ED at [facility].   Reason for Disposition  [1] SEVERE pain with bending of finger (or toe) AND [2] wound on hand (or foot)  Protocols used: Wound Infection on Antibiotic Follow-up Call-[facility]

## 2024-11-02 NOTE — ED Triage Notes (Signed)
 Pt gives verbal consent for [facility]se

## 2024-11-03 ENCOUNTER — Emergency Department (HOSPITAL_COMMUNITY)
Admission: EM | Admit: 2024-11-03 | Discharge: 2024-11-03 | Discharge status: Against Medical Advice | Attending: Emergency Medicine | Admitting: Emergency Medicine

## 2024-11-03 ENCOUNTER — Inpatient Hospital Stay (HOSPITAL_BASED_OUTPATIENT_CLINIC_OR_DEPARTMENT_OTHER)
Admission: EM | Admit: 2024-11-03 | Discharge: 2024-11-07 | DRG: 981 | Disposition: A | Discharge status: Home / Self Care | Attending: Family Medicine | Admitting: Family Medicine

## 2024-11-03 ENCOUNTER — Encounter (HOSPITAL_BASED_OUTPATIENT_CLINIC_OR_DEPARTMENT_OTHER): Payer: Self-pay | Admitting: Emergency Medicine

## 2024-11-03 ENCOUNTER — Other Ambulatory Visit: Payer: Self-pay

## 2024-11-03 DIAGNOSIS — M069 Rheumatoid arthritis, unspecified: Secondary | ICD-10-CM | POA: Diagnosis present

## 2024-11-03 DIAGNOSIS — Z79899 Other long term (current) drug therapy: Secondary | ICD-10-CM

## 2024-11-03 DIAGNOSIS — E871 Hypo-osmolality and hyponatremia: Secondary | ICD-10-CM | POA: Diagnosis present

## 2024-11-03 DIAGNOSIS — Z888 Allergy status to other drugs, medicaments and biological substances status: Secondary | ICD-10-CM

## 2024-11-03 DIAGNOSIS — Z8711 Personal history of peptic ulcer disease: Secondary | ICD-10-CM

## 2024-11-03 DIAGNOSIS — Z91018 Allergy to other foods: Secondary | ICD-10-CM

## 2024-11-03 DIAGNOSIS — R7881 Bacteremia: Secondary | ICD-10-CM | POA: Diagnosis present

## 2024-11-03 DIAGNOSIS — M797 Fibromyalgia: Secondary | ICD-10-CM | POA: Diagnosis present

## 2024-11-03 DIAGNOSIS — Z981 Arthrodesis status: Secondary | ICD-10-CM

## 2024-11-03 DIAGNOSIS — A400 Sepsis due to streptococcus, group A: Secondary | ICD-10-CM | POA: Diagnosis present

## 2024-11-03 DIAGNOSIS — Z96612 Presence of left artificial shoulder joint: Secondary | ICD-10-CM | POA: Diagnosis present

## 2024-11-03 DIAGNOSIS — Z5321 Procedure and treatment not carried out due to patient leaving prior to being seen by health care provider: Secondary | ICD-10-CM | POA: Insufficient documentation

## 2024-11-03 DIAGNOSIS — F32A Depression, unspecified: Secondary | ICD-10-CM | POA: Diagnosis present

## 2024-11-03 DIAGNOSIS — B950 Streptococcus, group A, as the cause of diseases classified elsewhere: Secondary | ICD-10-CM | POA: Diagnosis present

## 2024-11-03 DIAGNOSIS — Z90710 Acquired absence of both cervix and uterus: Secondary | ICD-10-CM

## 2024-11-03 DIAGNOSIS — R7303 Prediabetes: Secondary | ICD-10-CM | POA: Diagnosis present

## 2024-11-03 DIAGNOSIS — Z886 Allergy status to analgesic agent status: Secondary | ICD-10-CM

## 2024-11-03 DIAGNOSIS — M0020 Other streptococcal arthritis, unspecified joint: Secondary | ICD-10-CM

## 2024-11-03 DIAGNOSIS — M4310 Spondylolisthesis, site unspecified: Secondary | ICD-10-CM | POA: Diagnosis present

## 2024-11-03 DIAGNOSIS — Z881 Allergy status to other antibiotic agents status: Secondary | ICD-10-CM

## 2024-11-03 DIAGNOSIS — Z800 Family history of malignant neoplasm of digestive organs: Secondary | ICD-10-CM

## 2024-11-03 DIAGNOSIS — Z882 Allergy status to sulfonamides status: Secondary | ICD-10-CM

## 2024-11-03 DIAGNOSIS — L03011 Cellulitis of right finger: Principal | ICD-10-CM | POA: Diagnosis present

## 2024-11-03 DIAGNOSIS — R54 Age-related physical debility: Secondary | ICD-10-CM | POA: Diagnosis present

## 2024-11-03 DIAGNOSIS — M00841 Arthritis due to other bacteria, right hand: Secondary | ICD-10-CM | POA: Diagnosis present

## 2024-11-03 DIAGNOSIS — F411 Generalized anxiety disorder: Secondary | ICD-10-CM | POA: Diagnosis present

## 2024-11-03 DIAGNOSIS — L03119 Cellulitis of unspecified part of limb: Secondary | ICD-10-CM | POA: Diagnosis present

## 2024-11-03 DIAGNOSIS — N289 Disorder of kidney and ureter, unspecified: Secondary | ICD-10-CM | POA: Diagnosis not present

## 2024-11-03 DIAGNOSIS — E785 Hyperlipidemia, unspecified: Secondary | ICD-10-CM | POA: Diagnosis present

## 2024-11-03 DIAGNOSIS — L089 Local infection of the skin and subcutaneous tissue, unspecified: Secondary | ICD-10-CM | POA: Insufficient documentation

## 2024-11-03 DIAGNOSIS — G4700 Insomnia, unspecified: Secondary | ICD-10-CM | POA: Diagnosis present

## 2024-11-03 DIAGNOSIS — M19041 Primary osteoarthritis, right hand: Secondary | ICD-10-CM | POA: Diagnosis present

## 2024-11-03 DIAGNOSIS — I10 Essential (primary) hypertension: Secondary | ICD-10-CM | POA: Diagnosis present

## 2024-11-03 DIAGNOSIS — Z8701 Personal history of pneumonia (recurrent): Secondary | ICD-10-CM

## 2024-11-03 DIAGNOSIS — Z96641 Presence of right artificial hip joint: Secondary | ICD-10-CM | POA: Diagnosis present

## 2024-11-03 LAB — CBC WITH DIFFERENTIAL/PLATELET
Abs Immature Granulocytes: 0.07 K/uL (ref 0.00–0.07)
Basophils Absolute: 0 K/uL (ref 0.0–0.1)
Basophils Relative: 0 %
Eosinophils Absolute: 0 K/uL (ref 0.0–0.5)
Eosinophils Relative: 0 %
HCT: 32.6 % — ABNORMAL LOW (ref 36.0–46.0)
Hemoglobin: 10.7 g/dL — ABNORMAL LOW (ref 12.0–15.0)
Immature Granulocytes: 1 %
Lymphocytes Relative: 8 %
Lymphs Abs: 1.2 K/uL (ref 0.7–4.0)
MCH: 29 pg (ref 26.0–34.0)
MCHC: 32.8 g/dL (ref 30.0–36.0)
MCV: 88.3 fL (ref 80.0–100.0)
Monocytes Absolute: 0.7 K/uL (ref 0.1–1.0)
Monocytes Relative: 5 %
Neutro Abs: 13.5 K/uL — ABNORMAL HIGH (ref 1.7–7.7)
Neutrophils Relative %: 86 %
Platelets: 240 K/uL (ref 150–400)
RBC: 3.69 MIL/uL — ABNORMAL LOW (ref 3.87–5.11)
RDW: 13.4 % (ref 11.5–15.5)
WBC: 15.5 K/uL — ABNORMAL HIGH (ref 4.0–10.5)
nRBC: 0 % (ref 0.0–0.2)

## 2024-11-03 LAB — BLOOD CULTURE ID PANEL (REFLEXED) - BCID2

## 2024-11-03 LAB — BASIC METABOLIC PANEL WITH GFR
Anion gap: 12 (ref 5–15)
BUN: 8 mg/dL (ref 8–23)
CO2: 25 mmol/L (ref 22–32)
Calcium: 9.8 mg/dL (ref 8.9–10.3)
Chloride: 97 mmol/L — ABNORMAL LOW (ref 98–111)
Creatinine, Ser: 0.57 mg/dL (ref 0.44–1.00)
GFR, Estimated: >60 mL/min
Glucose, Bld: 123 mg/dL — ABNORMAL HIGH (ref 70–99)
Potassium: 3.8 mmol/L (ref 3.5–5.1)
Sodium: 134 mmol/L — ABNORMAL LOW (ref 135–145)

## 2024-11-03 LAB — LACTIC ACID, PLASMA: Lactic Acid, Venous: 0.8 mmol/L (ref 0.5–1.9)

## 2024-11-03 MED ORDER — FENTANYL CITRATE (PF) 50 MCG/ML IJ SOSY
50.0000 ug | PREFILLED_SYRINGE | INTRAMUSCULAR | Status: AC | PRN
  Administered 2024-11-03 – 2024-11-04 (×3): 50 ug via INTRAVENOUS
  Filled 2024-11-03 (×2): qty 1

## 2024-11-03 MED ORDER — ACETAMINOPHEN 500 MG PO TABS
1000.0000 mg | ORAL_TABLET | Freq: Once | ORAL | Status: AC
  Administered 2024-11-03: 1000 mg via ORAL
  Filled 2024-11-03: qty 2

## 2024-11-03 MED ORDER — FENTANYL CITRATE (PF) 50 MCG/ML IJ SOSY
50.0000 ug | PREFILLED_SYRINGE | Freq: Once | INTRAMUSCULAR | Status: AC
  Administered 2024-11-03: 50 ug via INTRAVENOUS
  Filled 2024-11-03: qty 1

## 2024-11-03 MED ORDER — LACTATED RINGERS IV BOLUS
1000.0000 mL | Freq: Once | INTRAVENOUS | Status: AC
  Administered 2024-11-03: 1000 mL via INTRAVENOUS

## 2024-11-03 MED ORDER — CEFAZOLIN SODIUM-DEXTROSE 2-4 GM/100ML-% IV SOLN
2.0000 g | Freq: Three times a day (TID) | INTRAVENOUS | Status: DC
  Administered 2024-11-03 – 2024-11-05 (×6): 2 g via INTRAVENOUS
  Filled 2024-11-03 (×2): qty 100

## 2024-11-03 NOTE — ED Triage Notes (Signed)
 Pt has infection to her right index finger, pt seen here last night but had to leave due to wait times.

## 2024-11-03 NOTE — ED Notes (Signed)
 Pt states she is going to [facility] due to the long wait.

## 2024-11-03 NOTE — ED Provider Notes (Addendum)
 " [place] EMERGENCY DEPARTMENT AT [facility] Provider Note   CSN: [id number] Arrival date & time: 11/03/24  1329     Patient presents with: Wound Check   Tanya Duncan is a 78 y.o. female with history of rheumatoid arthritis, prediabetes, presents with concern for right index finger pain and swelling that has been ongoing for about 1 week.  She was started on doxycycline by her PCP on 10/28/24 without improvement in symptoms.  She reports chills at home, but no fever.  Reports she went to [facility] yesterday but left due to wait times.    Wound Check       Prior to Admission medications  Medication Sig Start Date End Date Taking? Authorizing Provider  acetaminophen  (TYLENOL ) 500 MG tablet Take 500-1,000 mg by mouth every 6 (six) hours as needed for moderate pain (pain score 4-6).    [provider]  amLODipine (NORVASC) 5 MG tablet Take 5 mg by mouth daily. 08/27/24   [provider]  Ascorbic Acid (VITAMIN C) 1000 MG tablet Take 1,000 mg by mouth in the morning and at bedtime.    [provider]  b complex vitamins tablet Take 1 tablet by mouth daily.    [provider]  calcium carbonate (TUMS EX) 750 MG chewable tablet Chew 1-3 tablets by mouth as needed for heartburn.    [provider]  CALCIUM PO Take 2 each by mouth in the morning.    [provider]  ergocalciferol (VITAMIN D2) 1.25 MG (50000 UT) capsule Take 50,000 Units by mouth once a week. 08/26/20   [provider]  escitalopram  (LEXAPRO ) 20 MG tablet Take 20 mg by mouth in the morning.    [provider]  famotidine  (PEPCID ) 40 MG tablet TAKE 1 TABLET(40 MG) BY MOUTH TWICE DAILY 07/12/23   Aneita Gwendlyn DASEN, MD  gabapentin  (NEURONTIN ) 300 MG capsule Take 900 mg by mouth 2 (two) times daily.    [provider]  ketorolac  (TORADOL ) 60 MG/2ML SOLN injection Inject 60 mg into the muscle daily as needed (pain.).    [provider]  lidocaine  (LIDODERM ) 5 % Place 1 patch onto the skin daily as needed (for pain).    [provider]  Lidocaine  HCl (ASPERCREME LIDOCAINE  ESSENTIAL EX) Apply 1 Application topically 3 (three) times daily as needed (pain.).    [provider]  LORazepam  (ATIVAN ) 1 MG tablet Take 1 mg by mouth every 8 (eight) hours as needed for anxiety.    [provider]  losartan (COZAAR) 25 MG tablet Take 25 mg by mouth. 10/09/24   [provider]  Multiple Vitamin (MULTIVITAMIN WITH MINERALS) TABS tablet Take 1 tablet by mouth daily.    [provider]  nortriptyline  (PAMELOR ) 25 MG capsule Take 75 mg by mouth at bedtime.    [provider]  nystatin cream (MYCOSTATIN) Apply 1 Application topically 2 (two) times daily as needed (irritation (corners of mouth)).    [provider]  oxyCODONE -acetaminophen  (PERCOCET) 7.5-325 MG tablet Take 1 tablet by mouth. 10/09/24 11/08/24  [provider]  pantoprazole  (PROTONIX ) 40 MG tablet TAKE 1 TABLET(40 MG) BY MOUTH TWICE DAILY 11/06/23   Aneita Gwendlyn DASEN, MD  pravastatin  (PRAVACHOL ) 80 MG tablet Take 80 mg by mouth in the morning.    [provider]  tiZANidine  (ZANAFLEX ) 4 MG tablet Take 1 tablet (4 mg total) by mouth every 8 (eight) hours as needed for muscle spasms. Patient taking differently:  Take 2-4 mg by mouth every 8 (eight) hours as needed for muscle spasms. 02/04/21   Laliberte, Danielle, PA-C  traMADol (ULTRAM) 50 MG tablet Take 50-100 mg by mouth every 6 (six) hours as needed.    [provider]  zolpidem  (AMBIEN ) 10 MG tablet Take 10 mg by mouth at bedtime as needed for sleep (**MUST BE TORRENT BRAND**).    [provider]    Allergies: Atorvastatin, Bupropion, Duloxetine hcl, Ibuprofen, Sage [salvia officinalis], Sulfa antibiotics, Avelox [moxifloxacin hcl in nacl], and Indocin [indomethacin]    Review of Systems  Constitutional:  Positive for  chills. Negative for fever.  Skin:  Positive for color change.    Updated Vital Signs BP (!) 148/86 (BP Location: Left Arm)   Pulse 88   Temp (!) 101 F (38.3 C) (Oral)   Resp 16   LMP  (LMP Unknown)   SpO2 100%   Physical Exam Vitals and nursing note reviewed.  Constitutional:      Appearance: Normal appearance.  HENT:     Head: Atraumatic.  Cardiovascular:     Rate and Rhythm: Normal rate and regular rhythm.     Comments: Brisk cap refill in the right index finger Pulmonary:     Effort: Pulmonary effort is normal.  Musculoskeletal:     Comments: Right index finger General Diffuse, severe erythema and edema that extends from the base of the right index finger up to the distal phalanx.  There is also some erythema that extends through to MCP of the right middle finger.  There is an area of increased swelling along the dorsal aspect of the right index finger over the DIP joint.  This area is indurated and does not have any fluctuance.  There is mild serous drainage and some macerated skin tissue over this area.  Palpation Tender over the proximal, middle, or distal phalanx  ROM Full flexion and extension at the wrist Unable to flex and extend at the PIP and DIP joint of the right index finger.  Able to flex and extend at the MCP joint.   Sensation: Reports diminished sensation throughout the right index finger   Neurological:     General: No focal deficit present.     Mental Status: She is alert.  Psychiatric:        Mood and Affect: Mood normal.        Behavior: Behavior normal.          (all labs ordered are listed, but only abnormal results are displayed) Labs Reviewed  CBC WITH DIFFERENTIAL/PLATELET - Abnormal; Notable for the following components:      Result Value   WBC 15.5 (*)    RBC 3.69 (*)    Hemoglobin 10.7 (*)    HCT 32.6 (*)    Neutro Abs 13.5 (*)    All other components within normal limits  BASIC METABOLIC PANEL WITH GFR - Abnormal;  Notable for the following components:   Sodium 134 (*)    Chloride 97 (*)    Glucose, Bld 123 (*)    All other components within normal limits  BODY FLUID CULTURE W GRAM STAIN  LACTIC ACID, PLASMA  LACTIC ACID, PLASMA    EKG: None  Radiology: DG Hand Complete Right Result Date: 11/02/2024 EXAM: 3 OR MORE VIEW(S) XRAY OF THE RIGHT HAND 11/02/2024 05:52:12 PM COMPARISON: None available. CLINICAL HISTORY: Wound to right index finger with concern for osteomyelitis. FINDINGS: BONES AND JOINTS: ORIF of proximal phalanx of third digit  in place. Severe degenerative changes of the second through fourth distal interphalangeal joints with severe joint space narrowing and osteophyte formation. Specifically, at the 2nd distal interphalangeal joint, there are degenerative changes; however, there is increased joint space at this level, and a septic joint is not excluded. Moderate degenerative changes of the third digit. There is some mild periosteal reaction along the distal aspect of the 2nd middle phalanx, and osteomyelitis is not excluded. No acute fracture. No malalignment. SOFT TISSUES: Marked soft tissue swelling of distal aspect of index finger. IMPRESSION: 1. Marked soft tissue swelling of the distal aspect of the index finger. 2. Degenerative changes of the 2nd distal interphalangeal joint with increased joint space and mild periosteal reaction along the distal aspect of the 2nd middle phalanx; septic joint and osteomyelitis are not excluded. 3. Severe degenerative changes of the second through fourth distal interphalangeal joints with severe joint space narrowing and osteophyte formation; moderate degenerative changes of the third digit. 4. ORIF of the proximal phalanx of the third digit in place. Electronically signed by: Greig Pique MD 11/02/2024 07:09 PM EST RP Workstation: [id number]     .Critical Care  Performed by: Veta Palma, PA-C Authorized by: Veta Palma, PA-C   Critical  care provider statement:    Critical care time (minutes):  33   Critical care was necessary to treat or prevent imminent or life-threatening deterioration of the following conditions:  Sepsis   Critical care was time spent personally by me on the following activities:  Development of treatment plan with patient or surrogate, discussions with consultants, evaluation of patient's response to treatment, examination of patient, ordering and review of laboratory studies, ordering and review of radiographic studies, ordering and performing treatments and interventions, pulse oximetry, re-evaluation of patient's condition, review of old charts and obtaining history from patient or surrogate   Care discussed with: admitting provider      Medications Ordered in the ED  ceFAZolin  (ANCEF ) IVPB 2g/100 mL premix (0 g Intravenous Stopped 11/03/24 1729)  fentaNYL  (SUBLIMAZE ) injection 50 mcg (50 mcg Intravenous Given 11/03/24 1810)  acetaminophen  (TYLENOL ) tablet 1,000 mg (has no administration in time range)  lactated ringers  bolus 1,000 mL (has no administration in time range)  lactated ringers  bolus 1,000 mL (has no administration in time range)  fentaNYL  (SUBLIMAZE ) injection 50 mcg (50 mcg Intravenous Given 11/03/24 1701)    Clinical Course as of 11/03/24 1916  Sun Nov 03, 2024  1738 Dr. Reyne with hand recommends admission with medicine service and they will see patient in consult. [AF]  1752 Consulted with Dr. Tobie with the medicine service, she recommends admission [AF]    Clinical Course User Index [AF] Veta Palma, PA-C                                 Medical Decision Making Amount and/or Complexity of Data Reviewed Labs: ordered.  Risk OTC drugs. Prescription drug management. Decision regarding hospitalization.     Differential diagnosis includes but is not limited to paronychia, felon, osteomyelitis, flexor tenosynovitis, septic arthritis, bacteremia, sepsis  ED  Course:  Upon initial evaluation, patient is well-appearing, no acute distress.  Normal vitals.  On exam, has markedly swollen right index finger with erythema.  She was seen at [facility] emergency room yesterday for this, but left due to wait times.  I reviewed the x-ray imaging that was obtained yesterday which showed concern for possible osteomyelitis versus septic  joint due to the periosteal reaction noted and soft tissue swelling.  Blood cultures were also obtained at that time which were positive for strep pyogenes.  Leukocytosis of 13.6 on labs yesterday.  However, she remains afebrile and not tachycardic here, no signs of sepsis currently.  Will repeat CBC and obtain lactic acid here today. I consulted with pharmacist Dorn Poot regarding patient's positive blood culture results.  Pharmacy recommends starting patient on cefazolin  here and then transitioning to penicillin  drip once inpatient.  Labs Ordered: I Ordered, and personally interpreted labs.  The pertinent results include:   CBC with leukocytosis of 15.5, worsened from 13.6 yesterday BMP with slight hyponatremia 134.  Glucose 123 Lactic acid within normal limits  Consultations Obtained: I requested consultation with the hand orthopedics Dr. Reyne,  and discussed lab and imaging findings as well as pertinent plan - they recommend: Admission to hospitalist service, they will see patient in consult I requested consultation with the hospitalist Dr. Tobie,  and discussed lab and imaging findings as well as pertinent plan - they recommend: Admission  Medications Given: 2 g Ancef  Fentanyl   Upon re-evaluation, patient remains well-appearing with stable vitals.  She does have worsening leukocytosis of 15.5 here today.  However, remains afebrile, no tachycardia.  Lactic acid within normal limits.  Does not appear septic at this time.  Per recommendation of pharmacy, patient was started on Ancef  here in the emergency room for the  positive blood culture results and patient's finger infection.  Patient will be admitted to the medicine service with an orthopedics to consult. Addendum 7:14 PM.  I was notified that patient now has a temperature that 101.  Given her leukocytosis and known infection, this is concerning for sepsis.  Sepsis order set initiated.  She has been started on fluid bolus.  She already has antibiotics running, had blood cultures obtained yesterday.  Will order Tylenol  for fever.  Impression: Right index finger infection, concern for possible osteomyelitis vs septic arthritis vs flexor tenosynovitis Bacteremia  Disposition:  Patient admitted with hospitalist Dr. Tobie.  Dr. Reyne with hand will see patient in consult    Record Review: External records from outside source obtained and reviewed including PCP note from 10/28/2024 where she was started on doxycycline for her right index finger infection.  Labs and x-ray imaging from her ER visit at [facility] yesterday     This chart was dictated using voice recognition software, Dragon. Despite the best efforts of this provider to proofread and correct errors, errors may still occur which can change documentation meaning.       Final diagnoses:  Infection of index finger  Bacteremia    ED Discharge Orders     None          Veta Palma, PA-C 11/03/24 1850    Veta Palma, PA-C 11/03/24 1915    Veta Palma, PA-C 11/03/24 1917    Bari Roxie HERO, DO 11/03/24 2034  "

## 2024-11-03 NOTE — Progress Notes (Signed)
 78 year old female with past medical history of rheumatoid arthritis, Essential HTN, Hyperlipidemia, GAD, Anemia, Vit D Def and OA, hyponatremia, insomnia, GERD, prediabetes, primary care patient of [facility] seen at [facility] today for right index finger pain and swelling for the past 1 week status post outpatient doxycycline treatment with no improvement of symptoms.  X-ray imaging showed soft tissue swelling of the distal aspect of first MCP, Marked soft tissue swelling of the distal aspect of the index finger. 2. Degenerative changes of the 2nd distal interphalangeal joint with increased joint space and mild periosteal reaction along the distal aspect of the 2nd middle phalanx; septic joint and osteomyelitis are not excluded. 3. Severe degenerative changes of the second through fourth distal interphalangeal joints with severe joint space narrowing and osteophyte formation; moderate degenerative changes of the third digit. 4. ORIF of the proximal phalanx of the third digit in place.  Admission for septic arthritis. Today's Vitals   11/03/24 1708 11/03/24 1709 11/03/24 1715 11/03/24 1721  BP: (!) 148/86  (!) 148/86   Pulse:  88 88   Resp:   16   Temp:   98.5 F (36.9 C)   TempSrc:   Oral   SpO2:  100% 100%   PainSc:    6    There is no height or weight on file to calculate BMI. Vitals stable.  Normal electrolytes kidney function.  Lactic acid of 0.8 white count of 15.5.  Will accept pt for iv abx to med tele bed.   Hand MD will see pt Dr.DeBauer.

## 2024-11-03 NOTE — Sepsis Progress Note (Signed)
 Elink following for sepsis protocol.

## 2024-11-03 NOTE — ED Triage Notes (Signed)
 Infection to right index finger  Started last week  Started on doxy Seen at [facility] but left due to wait times Blood work done at [facility]

## 2024-11-03 NOTE — Hospital Course (Signed)
 SABRA

## 2024-11-03 NOTE — Telephone Encounter (Signed)
 Patient reports that she was seen in the ED yesterday and left AMA after waiting several hours. Patient calling for advice as she was told The ED is not where you come for a wound. Patient states that she has a wound to her her right index finger and is having numbness to her same finger. Patient states that she cannot bend her finger. Patient rates pain 8/10 and states that she cannot take her temperature at home. Patient advised to be seen at the ED and states that she doesn't know if she will go. Patient states that she may be seen at [facility]. Patient states understanding of care advice, but may be seen at [facility] first.    Reason for Disposition  Sounds like a serious injury to the triager  SEVERE pain in the wound  Protocols used: Finger Injury-A-AH, Wound Infection Suspected-A-AH

## 2024-11-03 NOTE — ED Notes (Signed)
 Micro called and reported +BC  gram + cocci

## 2024-11-04 DIAGNOSIS — L089 Local infection of the skin and subcutaneous tissue, unspecified: Secondary | ICD-10-CM | POA: Diagnosis not present

## 2024-11-04 DIAGNOSIS — R7303 Prediabetes: Secondary | ICD-10-CM | POA: Diagnosis present

## 2024-11-04 DIAGNOSIS — M009 Pyogenic arthritis, unspecified: Secondary | ICD-10-CM | POA: Diagnosis not present

## 2024-11-04 DIAGNOSIS — F411 Generalized anxiety disorder: Secondary | ICD-10-CM | POA: Diagnosis present

## 2024-11-04 DIAGNOSIS — E871 Hypo-osmolality and hyponatremia: Secondary | ICD-10-CM | POA: Diagnosis present

## 2024-11-04 DIAGNOSIS — Z800 Family history of malignant neoplasm of digestive organs: Secondary | ICD-10-CM | POA: Diagnosis not present

## 2024-11-04 DIAGNOSIS — B950 Streptococcus, group A, as the cause of diseases classified elsewhere: Secondary | ICD-10-CM | POA: Diagnosis present

## 2024-11-04 DIAGNOSIS — F32A Depression, unspecified: Secondary | ICD-10-CM | POA: Diagnosis present

## 2024-11-04 DIAGNOSIS — Z981 Arthrodesis status: Secondary | ICD-10-CM | POA: Diagnosis not present

## 2024-11-04 DIAGNOSIS — E785 Hyperlipidemia, unspecified: Secondary | ICD-10-CM | POA: Diagnosis present

## 2024-11-04 DIAGNOSIS — M19041 Primary osteoarthritis, right hand: Secondary | ICD-10-CM | POA: Diagnosis present

## 2024-11-04 DIAGNOSIS — E782 Mixed hyperlipidemia: Secondary | ICD-10-CM | POA: Diagnosis not present

## 2024-11-04 DIAGNOSIS — L03119 Cellulitis of unspecified part of limb: Secondary | ICD-10-CM | POA: Diagnosis present

## 2024-11-04 DIAGNOSIS — F418 Other specified anxiety disorders: Secondary | ICD-10-CM | POA: Diagnosis not present

## 2024-11-04 DIAGNOSIS — L02511 Cutaneous abscess of right hand: Secondary | ICD-10-CM | POA: Diagnosis not present

## 2024-11-04 DIAGNOSIS — Z8711 Personal history of peptic ulcer disease: Secondary | ICD-10-CM | POA: Diagnosis not present

## 2024-11-04 DIAGNOSIS — I10 Essential (primary) hypertension: Secondary | ICD-10-CM | POA: Diagnosis present

## 2024-11-04 DIAGNOSIS — R197 Diarrhea, unspecified: Secondary | ICD-10-CM | POA: Diagnosis not present

## 2024-11-04 DIAGNOSIS — R7881 Bacteremia: Secondary | ICD-10-CM | POA: Diagnosis present

## 2024-11-04 DIAGNOSIS — A400 Sepsis due to streptococcus, group A: Secondary | ICD-10-CM | POA: Diagnosis present

## 2024-11-04 DIAGNOSIS — M00841 Arthritis due to other bacteria, right hand: Secondary | ICD-10-CM | POA: Diagnosis present

## 2024-11-04 DIAGNOSIS — G4700 Insomnia, unspecified: Secondary | ICD-10-CM | POA: Diagnosis present

## 2024-11-04 DIAGNOSIS — Z8701 Personal history of pneumonia (recurrent): Secondary | ICD-10-CM | POA: Diagnosis not present

## 2024-11-04 DIAGNOSIS — Z79899 Other long term (current) drug therapy: Secondary | ICD-10-CM | POA: Diagnosis not present

## 2024-11-04 DIAGNOSIS — Z96641 Presence of right artificial hip joint: Secondary | ICD-10-CM | POA: Diagnosis present

## 2024-11-04 DIAGNOSIS — M797 Fibromyalgia: Secondary | ICD-10-CM | POA: Diagnosis present

## 2024-11-04 DIAGNOSIS — Z96612 Presence of left artificial shoulder joint: Secondary | ICD-10-CM | POA: Diagnosis present

## 2024-11-04 DIAGNOSIS — M4310 Spondylolisthesis, site unspecified: Secondary | ICD-10-CM | POA: Diagnosis present

## 2024-11-04 DIAGNOSIS — M069 Rheumatoid arthritis, unspecified: Secondary | ICD-10-CM | POA: Diagnosis present

## 2024-11-04 DIAGNOSIS — R54 Age-related physical debility: Secondary | ICD-10-CM | POA: Diagnosis present

## 2024-11-04 DIAGNOSIS — L03011 Cellulitis of right finger: Secondary | ICD-10-CM | POA: Diagnosis present

## 2024-11-04 LAB — CBC WITH DIFFERENTIAL/PLATELET
Abs Immature Granulocytes: 0.04 K/uL (ref 0.00–0.07)
Basophils Absolute: 0 K/uL (ref 0.0–0.1)
Basophils Relative: 0 %
Eosinophils Absolute: 0.3 K/uL (ref 0.0–0.5)
Eosinophils Relative: 3 %
HCT: 31.5 % — ABNORMAL LOW (ref 36.0–46.0)
Hemoglobin: 10.1 g/dL — ABNORMAL LOW (ref 12.0–15.0)
Immature Granulocytes: 1 %
Lymphocytes Relative: 17 %
Lymphs Abs: 1.4 K/uL (ref 0.7–4.0)
MCH: 29.5 pg (ref 26.0–34.0)
MCHC: 32.1 g/dL (ref 30.0–36.0)
MCV: 92.1 fL (ref 80.0–100.0)
Monocytes Absolute: 0.5 K/uL (ref 0.1–1.0)
Monocytes Relative: 7 %
Neutro Abs: 5.9 K/uL (ref 1.7–7.7)
Neutrophils Relative %: 72 %
Platelets: 210 K/uL (ref 150–400)
RBC: 3.42 MIL/uL — ABNORMAL LOW (ref 3.87–5.11)
RDW: 13.4 % (ref 11.5–15.5)
WBC: 8.1 K/uL (ref 4.0–10.5)
nRBC: 0 % (ref 0.0–0.2)

## 2024-11-04 LAB — COMPREHENSIVE METABOLIC PANEL WITH GFR
ALT: 10 U/L (ref 0–44)
AST: 17 U/L (ref 15–41)
Albumin: 3.6 g/dL (ref 3.5–5.0)
Alkaline Phosphatase: 72 U/L (ref 38–126)
Anion gap: 8 (ref 5–15)
BUN: 9 mg/dL (ref 8–23)
CO2: 29 mmol/L (ref 22–32)
Calcium: 8.8 mg/dL — ABNORMAL LOW (ref 8.9–10.3)
Chloride: 96 mmol/L — ABNORMAL LOW (ref 98–111)
Creatinine, Ser: 1.13 mg/dL — ABNORMAL HIGH (ref 0.44–1.00)
GFR, Estimated: 50 mL/min — ABNORMAL LOW
Glucose, Bld: 131 mg/dL — ABNORMAL HIGH (ref 70–99)
Potassium: 3.8 mmol/L (ref 3.5–5.1)
Sodium: 134 mmol/L — ABNORMAL LOW (ref 135–145)
Total Bilirubin: 0.4 mg/dL (ref 0.0–1.2)
Total Protein: 5.8 g/dL — ABNORMAL LOW (ref 6.5–8.1)

## 2024-11-04 LAB — TYPE AND SCREEN
ABO/RH(D): O POS
Antibody Screen: NEGATIVE

## 2024-11-04 LAB — C-REACTIVE PROTEIN: CRP: 18.3 mg/dL — ABNORMAL HIGH

## 2024-11-04 MED ORDER — OXYCODONE-ACETAMINOPHEN 5-325 MG PO TABS
1.5000 | ORAL_TABLET | Freq: Three times a day (TID) | ORAL | Status: DC | PRN
  Administered 2024-11-04 – 2024-11-05 (×3): 1.5 via ORAL
  Filled 2024-11-04 (×3): qty 2

## 2024-11-04 MED ORDER — LINEZOLID 600 MG/300ML IV SOLN
600.0000 mg | Freq: Two times a day (BID) | INTRAVENOUS | Status: DC
  Administered 2024-11-04 – 2024-11-07 (×6): 600 mg via INTRAVENOUS
  Filled 2024-11-04 (×7): qty 300

## 2024-11-04 MED ORDER — LORAZEPAM 1 MG PO TABS
1.0000 mg | ORAL_TABLET | Freq: Once | ORAL | Status: AC
  Administered 2024-11-04: 1 mg via ORAL
  Filled 2024-11-04: qty 1

## 2024-11-04 MED ORDER — HYDROCODONE-ACETAMINOPHEN 5-325 MG PO TABS: 1.0000 | ORAL_TABLET | ORAL | Status: DC | PRN

## 2024-11-04 MED ORDER — HEPARIN SODIUM (PORCINE) 5000 UNIT/ML IJ SOLN
5000.0000 [IU] | Freq: Two times a day (BID) | INTRAMUSCULAR | Status: DC
  Filled 2024-11-04 (×6): qty 1

## 2024-11-04 MED ORDER — LINEZOLID 600 MG/300ML IV SOLN
600.0000 mg | Freq: Two times a day (BID) | INTRAVENOUS | Status: DC
  Filled 2024-11-04 (×2): qty 300

## 2024-11-04 MED ORDER — SODIUM CHLORIDE 0.9 % IV SOLN: INTRAVENOUS | Status: DC

## 2024-11-04 MED ORDER — SODIUM CHLORIDE 0.9% FLUSH
3.0000 mL | Freq: Two times a day (BID) | INTRAVENOUS | Status: DC
  Administered 2024-11-04 – 2024-11-07 (×6): 3 mL via INTRAVENOUS

## 2024-11-04 MED ORDER — LORAZEPAM 1 MG PO TABS
1.0000 mg | ORAL_TABLET | Freq: Three times a day (TID) | ORAL | Status: DC | PRN
  Administered 2024-11-04 – 2024-11-06 (×5): 1 mg via ORAL
  Filled 2024-11-04 (×5): qty 1

## 2024-11-04 MED ORDER — TEMAZEPAM 7.5 MG PO CAPS: 7.5000 mg | ORAL_CAPSULE | Freq: Every evening | ORAL | Status: DC | PRN

## 2024-11-04 MED ORDER — HYDRALAZINE HCL 20 MG/ML IJ SOLN: 5.0000 mg | INTRAMUSCULAR | Status: DC | PRN

## 2024-11-04 MED ORDER — ZOLPIDEM TARTRATE 10 MG PO TABS: 10.0000 mg | ORAL_TABLET | Freq: Every evening | ORAL | Status: DC | PRN

## 2024-11-04 MED ORDER — ACETAMINOPHEN 325 MG PO TABS
650.0000 mg | ORAL_TABLET | Freq: Four times a day (QID) | ORAL | Status: DC | PRN
  Administered 2024-11-04 – 2024-11-06 (×4): 650 mg via ORAL
  Filled 2024-11-04 (×4): qty 2

## 2024-11-04 MED ORDER — MORPHINE SULFATE (PF) 2 MG/ML IV SOLN: 2.0000 mg | INTRAVENOUS | Status: DC | PRN

## 2024-11-04 MED ORDER — ACETAMINOPHEN 650 MG RE SUPP: 650.0000 mg | Freq: Four times a day (QID) | RECTAL | Status: DC | PRN

## 2024-11-04 NOTE — H&P (Signed)
 " History and Physical    Patient: Tanya Duncan FMW:[id number] DOB: 13-Jun-1946 DOA: 11/03/2024 DOS: the patient was seen and examined on 11/04/2024 . PCP: [name], MD  Patient coming from: [facility] Chief complaint: Chief Complaint  Patient presents with   Wound Check   HPI:  Tanya Duncan is a 78 year old female with past medical history of rheumatoid arthritis,Essential HTN, Hyperlipidemia, GAD, Anemia, Vit D Def and OA, hyponatremia, insomnia, GERD, prediabetes, primary care patient of [facility] seen [facility] today for right index finger pain and swelling for the past 1 week status post outpatient doxycycline treatment with no improvement of symptoms.  X-ray imaging showed soft tissue swelling of the distal aspect of first MCP, Marked soft tissue swelling of the distal aspect of the index finger. 2. Degenerative changes of the 2nd distal interphalangeal joint with increased joint space and mild periosteal reaction along the distal aspect of the 2nd middle phalanx; septic joint and osteomyelitis are not excluded. 3. Severe degenerative changes of the second through fourth distal interphalangeal joints with severe joint space narrowing and osteophyte formation; moderate degenerative changes of the third digit. 4. ORIF of the proximal phalanx of the third digit in place.  Pt arrived today from [facility] and orthopedic Dr.Gebauer was consulted and has acknowledged pt and plan to cont abx and he will consult and manage.   ED Course:  Vital signs in the ED were notable for the following:  Vitals:   11/04/24 1233 11/04/24 1415 11/04/24 1543 11/04/24 1545  BP:  128/73 136/76   Pulse:  84 88   Temp: 98.6 F (37 C) 98.3 F (36.8 C) 98.6 F (37 C)   Resp:  16 18   Height:    5' 0.5 (1.537 m)  Weight:    59 kg  SpO2:  100% 100%   TempSrc: Oral Oral Oral   BMI (Calculated):    24.97   >>ED evaluation @[facility]  showed: BMP showing sodium 132 chloride 96 glucose 104 normal kidney function.  Lactic acid of 1.1.  CBC showing white count of 13.6 hemoglobin of 10.9 and platelets of 260.  Cultures were collected on November 02, 2024 that are showing strep pyogenes. CMP is ordered. CBC today is ordered. CRP is ordered.    >>While in the ED patient received the following: Medications  ceFAZolin  (ANCEF ) IVPB 2g/100 mL premix (0 g Intravenous Stopped 11/04/24 1358)  oxyCODONE -acetaminophen  (PERCOCET/ROXICET) 5-325 MG per tablet 1.5 tablet (1.5 tablets Oral Given 11/04/24 1123)  zolpidem  (AMBIEN ) tablet 10 mg (has no administration in time range)  linezolid  (ZYVOX ) IVPB 600 mg (has no administration in time range)  fentaNYL  (SUBLIMAZE ) injection 50 mcg (50 mcg Intravenous Given 11/03/24 1701)  fentaNYL  (SUBLIMAZE ) injection 50 mcg (50 mcg Intravenous Given 11/04/24 1002)  acetaminophen  (TYLENOL ) tablet 1,000 mg (1,000 mg Oral Given 11/03/24 1926)  lactated ringers  bolus 1,000 mL ( Intravenous Stopped 11/04/24 0335)  lactated ringers  bolus 1,000 mL ( Intravenous Stopped 11/03/24 2030)  LORazepam  (ATIVAN ) tablet 1 mg (1 mg Oral Given 11/04/24 0247)  LORazepam  (ATIVAN ) tablet 1 mg (1 mg Oral Given 11/04/24 1404)   Review of Systems  Constitutional:  Positive for chills.   Past Medical History:  Diagnosis Date   Anxiety    Arthritis    Blood transfusion without reported diagnosis 05/2019   with hip surgery   Complication of anesthesia    Fibromyalgia    Fractured pelvis (HCC) 09/15/2020   right   GERD (gastroesophageal reflux disease)  GI bleed 8111,8009   x2   Hyperlipidemia    Palpitations    Pneumonia 2005   PONV (postoperative nausea and vomiting)    Pre-diabetes    Stomach ulcer 1990   Past Surgical History:  Procedure Laterality Date   ABDOMINAL HYSTERECTOMY     FRACTURE SURGERY Right 1998   ankle   JOINT REPLACEMENT     LUMBAR FUSION  2015   OPEN REDUCTION INTERNAL FIXATION (ORIF) PROXIMAL PHALANX Right 09/21/2020   Procedure: OPEN TREATMENT OF RIGHT LONG  FINGER PROXIMAL PHALANX FRACTURE;  Surgeon: Sebastian Lenis, MD;  Location: [facility] OR;  Service: Orthopedics;  Laterality: Right;  LENGTH OF SURGERY: 75 MIN   ORIF HUMERUS FRACTURE Left    ORIF HUMERUS FRACTURE Left 05/15/2023   Procedure: OPEN REDUCTION INTERNAL FIXATION (ORIF) PERIPROSTHETIC HUMERUS FRACTURE WITH PLATE REMOVAL;  Surgeon: Dozier Soulier, MD;  Location: [facility] ORS;  Service: Orthopedics;  Laterality: Left;   ORIF TIBIA FRACTURE Right 1998   with bone graft   PYLOROPLASTY     REVERSE SHOULDER ARTHROPLASTY Left 02/04/2021   Procedure: REVERSE SHOULDER ARTHROPLASTY WITH HARDWARE REMOVAL;  Surgeon: Dozier Soulier, MD;  Location: [facility] ORS;  Service: Orthopedics;  Laterality: Left;   TOTAL HIP ARTHROPLASTY  2011   TOTAL HIP ARTHROPLASTY Right 05/14/2019   Procedure: Right Anterior Hip Arthroplasty;  Surgeon: Sheril Coy, MD;  Location: [facility] ORS;  Service: Orthopedics;  Laterality: Right;   TOTAL SHOULDER REPLACEMENT  2014    reports that she has never smoked. She has never used smokeless tobacco. She reports that she does not currently use alcohol. She reports that she does not use drugs. Allergies[1] Family History  Problem Relation Age of Onset   Colon cancer Father 8   Stomach cancer Paternal Uncle    Esophageal cancer Neg Hx    Rectal cancer Neg Hx    Prior to Admission medications  Medication Sig Start Date End Date Taking? Authorizing Provider  acetaminophen  (TYLENOL ) 500 MG tablet Take 500-1,000 mg by mouth every 6 (six) hours as needed for moderate pain (pain score 4-6).   Yes [provider]  Ascorbic Acid (VITAMIN C) 1000 MG tablet Take 1,000 mg by mouth in the morning and at bedtime.   Yes [provider]  b complex vitamins tablet Take 1 tablet by mouth daily.   Yes [provider]  calcium carbonate (TUMS EX) 750 MG chewable tablet Chew 1-3 tablets by mouth as needed for heartburn.   Yes [provider]  CALCIUM PO Take 2 each by mouth  in the morning.   Yes [provider]  ergocalciferol (VITAMIN D2) 1.25 MG (50000 UT) capsule Take 50,000 Units by mouth once a week. 08/26/20  Yes [provider]  escitalopram  (LEXAPRO ) 20 MG tablet Take 20 mg by mouth in the morning.   Yes [provider]  famotidine  (PEPCID ) 40 MG tablet TAKE 1 TABLET(40 MG) BY MOUTH TWICE DAILY 07/12/23  Yes Aneita Gwendlyn DASEN, MD  gabapentin  (NEURONTIN ) 300 MG capsule Take 900 mg by mouth 2 (two) times daily.   Yes [provider]  lidocaine  (LIDODERM ) 5 % Place 1 patch onto the skin daily as needed (for pain).   Yes [provider]  Lidocaine  HCl (ASPERCREME LIDOCAINE  ESSENTIAL EX) Apply 1 Application topically 3 (three) times daily as needed (pain.).   Yes [provider]  LORazepam  (ATIVAN ) 1 MG tablet Take 1 mg by mouth every 8 (eight) hours as needed for anxiety.   Yes  [provider]  losartan (COZAAR) 25 MG tablet Take 25 mg by mouth. 10/09/24  Yes [provider]  losartan (COZAAR) 50 MG tablet Take 50 mg by mouth daily. 11/01/24  Yes [provider]  Multiple Vitamin (MULTIVITAMIN WITH MINERALS) TABS tablet Take 1 tablet by mouth daily.   Yes [provider]  nortriptyline  (PAMELOR ) 25 MG capsule Take 75 mg by mouth at bedtime.   Yes [provider]  nystatin cream (MYCOSTATIN) Apply 1 Application topically 2 (two) times daily as needed (irritation (corners of mouth)).   Yes [provider]  oxyCODONE -acetaminophen  (PERCOCET) 7.5-325 MG tablet Take 1 tablet by mouth. 10/09/24 11/08/24 Yes [provider]  pravastatin  (PRAVACHOL ) 80 MG tablet Take 80 mg by mouth in the morning.   Yes [provider]  tiZANidine  (ZANAFLEX ) 4 MG tablet Take 1 tablet (4 mg total) by mouth every 8 (eight) hours as needed for muscle spasms. Patient taking differently: Take 2-4 mg by mouth every 8 (eight) hours as needed for muscle spasms. 02/04/21  Yes  Laliberte, Danielle, PA-C  traMADol (ULTRAM) 50 MG tablet Take 50-100 mg by mouth every 6 (six) hours as needed.   Yes [provider]  zolpidem  (AMBIEN  CR) 12.5 MG CR tablet Take 12.5 mg by mouth at bedtime as needed for sleep.   Yes [provider]  doxycycline (VIBRAMYCIN) 100 MG capsule Take 100 mg by mouth 2 (two) times daily.    [provider]  ketorolac  (TORADOL ) 60 MG/2ML SOLN injection Inject 60 mg into the muscle daily as needed (pain.).    [provider]  zolpidem  (AMBIEN ) 10 MG tablet Take 10 mg by mouth at bedtime as needed for sleep (**MUST BE TORRENT BRAND**).    [provider]                                                                                 Vitals:   11/04/24 1233 11/04/24 1415 11/04/24 1543 11/04/24 1545  BP:  128/73 136/76   Pulse:  84 88   Resp:  16 18   Temp: 98.6 F (37 C) 98.3 F (36.8 C) 98.6 F (37 C)   TempSrc: Oral Oral Oral   SpO2:  100% 100%   Weight:    59 kg  Height:    5' 0.5 (1.537 m)   Physical Exam Vitals reviewed.  Constitutional:      General: She is not in acute distress.    Appearance: She is not ill-appearing.  HENT:     Head: Normocephalic.  Eyes:     Extraocular Movements: Extraocular movements intact.  Cardiovascular:     Rate and Rhythm: Normal rate and regular rhythm.     Heart sounds: Normal heart sounds.  Pulmonary:     Breath sounds: Normal breath sounds.  Abdominal:     General: There is no distension.     Palpations: Abdomen is soft.     Tenderness: There is no abdominal tenderness.  Musculoskeletal:        General: Swelling and deformity present.  Neurological:     General: No focal deficit present.     Mental Status: She is alert  and oriented to person, place, and time.     Labs on Admission: I have personally reviewed following labs and imaging studies CBC: Recent Labs  Lab 11/02/24 1720 11/03/24 1637  WBC 13.6* 15.5*  NEUTROABS 10.4* 13.5*  HGB  10.9* 10.7*  HCT 33.1* 32.6*  MCV 90.4 88.3  PLT 260 240   Basic Metabolic Panel: Recent Labs  Lab 11/02/24 1720 11/03/24 1637  NA 132* 134*  K 3.6 3.8  CL 96* 97*  CO2 25 25  GLUCOSE 104* 123*  BUN 9 8  CREATININE 0.67 0.57  CALCIUM 8.9 9.8   GFR: Estimated Creatinine Clearance: 48.7 mL/min (by C-G formula based on SCr of 0.57 mg/dL). Liver Function Tests: No results for input(s): AST, ALT, ALKPHOS, BILITOT, PROT, ALBUMIN in the last 168 hours. No results for input(s): LIPASE, AMYLASE in the last 168 hours. No results for input(s): AMMONIA in the last 168 hours. Recent Labs    08/13/24 1353 08/26/24 1526 11/02/24 1720 11/03/24 1637  BUN 6* 8 9 8   CREATININE 0.66 0.70 0.67 0.57    Cardiac Enzymes: No results for input(s): CKTOTAL, CKMB, CKMBINDEX, TROPONINI in the last 168 hours. BNP (last 3 results) No results for input(s): PROBNP in the last 8760 hours. HbA1C: No results for input(s): HGBA1C in the last 72 hours. CBG: No results for input(s): GLUCAP in the last 168 hours. Lipid Profile: No results for input(s): CHOL, HDL, LDLCALC, TRIG, CHOLHDL, LDLDIRECT in the last 72 hours. Thyroid Function Tests: No results for input(s): TSH, T4TOTAL, FREET4, T3FREE, THYROIDAB in the last 72 hours. Anemia Panel: No results for input(s): VITAMINB12, FOLATE, FERRITIN, TIBC, IRON, RETICCTPCT in the last 72 hours. Urine analysis:    Component Value Date/Time   COLORURINE STRAW (A) 01/29/2021 1520   APPEARANCEUR CLEAR 01/29/2021 1520   LABSPEC 1.004 (L) 01/29/2021 1520   PHURINE 7.0 01/29/2021 1520   GLUCOSEU NEGATIVE 01/29/2021 1520   HGBUR NEGATIVE 01/29/2021 1520   BILIRUBINUR NEGATIVE 01/29/2021 1520   KETONESUR NEGATIVE 01/29/2021 1520   PROTEINUR NEGATIVE 01/29/2021 1520   NITRITE NEGATIVE 01/29/2021 1520   LEUKOCYTESUR NEGATIVE 01/29/2021 1520   Radiological Exams on Admission: No results  found.  Data Reviewed: Relevant notes from primary care and specialist visits, past discharge summaries as available in EHR, including Care Everywhere . Prior diagnostic testing as pertinent to current admission diagnoses, Updated medications and problem lists for reconciliation .ED course, including vitals, labs, imaging, treatment and response to treatment,Triage notes, nursing and pharmacy notes and ED provider's notes.Notable results as noted in HPI.Discussed case with EDMD/ ED APP/ or Specialty MD on call and as needed.  Assessment & Plan  >>Right index finger infection and cellulitis of hand: Patient presenting with extensive right index finger infection and thinks that she had contracted it with gardening.  Patient has deforming rheumatoid arthritis affecting DIPs of all her digits.  X-ray imaging does show soft tissue swelling with concerns for septic arthritis and osteomyelitis.  Pain control and as needed Tylenol . Appreciate orthopedic consult and management.  Will defer to Ortho for additional imaging.  Continue antibiotic per ID MD Dr. Constance. Patient continued on cefazolin  and linezolid  will follow sensitivities.        >> Essential hypertension: Continue patient on losartan.   >> Insomnia: We do not have Ambien  CR that the patient takes and have offered temazepam  which the patient has agreed to try.  >> Generalized anxiety disorder: Continue patient's home Lexapro  at 20 mg.  And nortriptyline .  DVT  prophylaxis:  Heparin  Consults:  Orthopedics: Dr. Reyne  Advance Care Planning:    Code Status: Full Code   Family Communication:  None Disposition Plan:  Home Severity of Illness: The appropriate patient status for this patient is INPATIENT. Inpatient status is judged to be reasonable and necessary in order to provide the required intensity of service to ensure the patient's safety. The patient's presenting symptoms, physical exam findings, and initial radiographic  and laboratory data in the context of their chronic comorbidities is felt to place them at high risk for further clinical deterioration. Furthermore, it is not anticipated that the patient will be medically stable for discharge from the hospital within 2 midnights of admission.   * I certify that at the point of admission it is my clinical judgment that the patient will require inpatient hospital care spanning beyond 2 midnights from the point of admission due to high intensity of service, high risk for further deterioration and high frequency of surveillance required.*  Unresulted Labs (From admission, onward)     Start     Ordered   11/05/24 0500  Comprehensive metabolic panel  Tomorrow morning,   R        11/04/24 1551   11/05/24 0500  CBC  Tomorrow morning,   R        11/04/24 1551   11/04/24 1903  C-reactive protein  Add-on,   AD       Question:  Specimen collection method  Answer:  Lab=Lab collect   11/04/24 1902   11/04/24 1558  Comprehensive metabolic panel with GFR  ONCE - STAT,   STAT        11/04/24 1557   11/04/24 1558  CBC with Differential/Platelet  ONCE - STAT,   STAT        11/04/24 1557   11/04/24 1558  Type and screen  Once,   R        11/04/24 1557   11/03/24 1734  Body fluid culture w Gram Stain  Once,   R       Question Answer Comment  Are there also cytology or pathology orders on this specimen? No   Patient immune status Normal      11/03/24 1733            Meds ordered this encounter  Medications   fentaNYL  (SUBLIMAZE ) injection 50 mcg   ceFAZolin  (ANCEF ) IVPB 2g/100 mL premix    Antibiotic Indication::   Bacteremia   fentaNYL  (SUBLIMAZE ) injection 50 mcg   acetaminophen  (TYLENOL ) tablet 1,000 mg   lactated ringers  bolus 1,000 mL   lactated ringers  bolus 1,000 mL   oxyCODONE -acetaminophen  (PERCOCET/ROXICET) 5-325 MG per tablet 1.5 tablet    Refill:  0   DISCONTD: zolpidem  (AMBIEN ) tablet 10 mg   LORazepam  (ATIVAN ) tablet 1 mg   LORazepam  (ATIVAN )  tablet 1 mg   DISCONTD: linezolid  (ZYVOX ) IVPB 600 mg    Antibiotic Indication::   Wound Infection   heparin  injection 5,000 Units   sodium chloride  flush (NS) 0.9 % injection 3 mL   0.9 %  sodium chloride  infusion   OR Linked Order Group    acetaminophen  (TYLENOL ) tablet 650 mg    acetaminophen  (TYLENOL ) suppository 650 mg   HYDROcodone -acetaminophen  (NORCO/VICODIN) 5-325 MG per tablet 1 tablet    Refill:  0   morphine  (PF) 2 MG/ML injection 2 mg   hydrALAZINE (APRESOLINE) injection 5 mg   linezolid  (ZYVOX ) IVPB 600 mg    Antibiotic Indication::  Wound Infection   temazepam  (RESTORIL ) capsule 7.5 mg     Orders Placed This Encounter  Procedures   Critical Care   Body fluid culture w Gram Stain   CBC with Differential   Basic metabolic panel   Comprehensive metabolic panel   CBC   Comprehensive metabolic panel with GFR   CBC with Differential/Platelet   C-reactive protein   Diet Heart Room service appropriate? Yes with Assist; Fluid consistency: Thin   Maintain IV access   Vital signs   Notify physician (specify)   Mobility Protocol: No Restrictions   Refer to Sidebar Report Mobility Protocol for Adult Inpatient   Initiate Adult Central Line Maintenance and Catheter Clearance Protocol for patients with central line (CVC, PICC, Port, Hemodialysis, Trialysis)   Daily weights   Intake and Output   Initiate CHG Protocol for patients in ICU/SD or any patient with a central line or foley catheter   Do not place and if present remove PureWick   Initiate Oral Care Protocol   Initiate Carrier Fluid Protocol   RN may order General Admission PRN Orders utilizing General Admission PRN medications (through manage orders) for the following patient needs: allergy symptoms (Claritin), cold sores (Carmex), cough (Robitussin DM), eye irritation (Liquifilm Tears), hemorrhoids (Tucks), indigestion (Maalox), minor skin irritation (Hydrocortisone Cream), muscle pain Lucienne Gay), nose irritation  (saline nasal spray) and sore throat (Chloraseptic spray).   Patient has an active order for admit to inpatient/place in observation   Ambulate with assistance   Strict intake and output   Full code   Consult to hand surgery   Consult to hospitalist   Code Sepsis activation.  This occurs automatically when order is signed and prioritizes pharmacy, lab, and radiology services for STAT collections and interventions.  If [facility] downtime, call [facility] [phone]) to activate Code Sepsis.   Pulse oximetry check with vital signs   Oxygen therapy Mode or (Route): Nasal cannula; Liters Per Minute: 2; Keep O2 saturation between: greater than 92 %   EKG   Type and screen   Insert peripheral IV   Admit to Inpatient (patient's expected length of stay will be greater than 2 midnights or inpatient only procedure)   Admit to Inpatient (patient's expected length of stay will be greater than 2 midnights or inpatient only procedure)   Aspiration precautions   Fall precautions    Author: Mario LULLA Blanch, MD 12 pm- 8 pm. [facility]. 11/04/2024 7:13 PM Please note for any communication after hours contact [facility] Assigned provider on call on Amion.       [1]  Allergies Allergen Reactions   Atorvastatin Diarrhea   Bupropion Nausea And Vomiting   Duloxetine Hcl Nausea And Vomiting   Ibuprofen Other (See Comments)    Has had GI bleeds x2 in past   Sage [Salvia Officinalis] Palpitations and Other (See Comments)    Heart races, flu-like symptoms    Sulfa Antibiotics Nausea And Vomiting   Avelox [Moxifloxacin Hcl In Nacl] Palpitations   Indocin [Indomethacin] Other (See Comments)    Dizziness   "

## 2024-11-04 NOTE — Plan of Care (Signed)
   Problem: Activity: Goal: Risk for activity intolerance will decrease Outcome: Progressing   Problem: Nutrition: Goal: Adequate nutrition will be maintained Outcome: Progressing   Problem: Pain Managment: Goal: General experience of comfort will improve and/or be controlled Outcome: Progressing   Problem: Safety: Goal: Ability to remain free from injury will improve Outcome: Progressing

## 2024-11-04 NOTE — ED Notes (Signed)
 Called [facility] to transport the patient to [facility] 53M rm# [phone]

## 2024-11-05 ENCOUNTER — Encounter (HOSPITAL_COMMUNITY): Payer: Self-pay | Admitting: Internal Medicine

## 2024-11-05 ENCOUNTER — Encounter (HOSPITAL_COMMUNITY)
Admission: EM | Disposition: A | Discharge status: Home / Self Care | Payer: Self-pay | Source: Home / Self Care | Attending: Internal Medicine

## 2024-11-05 ENCOUNTER — Inpatient Hospital Stay (HOSPITAL_COMMUNITY): Admitting: Anesthesiology

## 2024-11-05 DIAGNOSIS — B950 Streptococcus, group A, as the cause of diseases classified elsewhere: Secondary | ICD-10-CM

## 2024-11-05 DIAGNOSIS — E782 Mixed hyperlipidemia: Secondary | ICD-10-CM

## 2024-11-05 DIAGNOSIS — R7881 Bacteremia: Secondary | ICD-10-CM

## 2024-11-05 DIAGNOSIS — L02511 Cutaneous abscess of right hand: Secondary | ICD-10-CM | POA: Diagnosis not present

## 2024-11-05 DIAGNOSIS — R197 Diarrhea, unspecified: Secondary | ICD-10-CM | POA: Diagnosis not present

## 2024-11-05 DIAGNOSIS — L089 Local infection of the skin and subcutaneous tissue, unspecified: Secondary | ICD-10-CM | POA: Diagnosis not present

## 2024-11-05 DIAGNOSIS — F418 Other specified anxiety disorders: Secondary | ICD-10-CM

## 2024-11-05 DIAGNOSIS — M009 Pyogenic arthritis, unspecified: Secondary | ICD-10-CM

## 2024-11-05 HISTORY — PX: INCISION AND DRAINAGE OF WOUND: SHX1803

## 2024-11-05 LAB — CBC
HCT: 30.6 % — ABNORMAL LOW (ref 36.0–46.0)
Hemoglobin: 10 g/dL — ABNORMAL LOW (ref 12.0–15.0)
MCH: 29.7 pg (ref 26.0–34.0)
MCHC: 32.7 g/dL (ref 30.0–36.0)
MCV: 90.8 fL (ref 80.0–100.0)
Platelets: 207 K/uL (ref 150–400)
RBC: 3.37 MIL/uL — ABNORMAL LOW (ref 3.87–5.11)
RDW: 13.4 % (ref 11.5–15.5)
WBC: 8.2 K/uL (ref 4.0–10.5)
nRBC: 0 % (ref 0.0–0.2)

## 2024-11-05 LAB — COMPREHENSIVE METABOLIC PANEL WITH GFR
ALT: 9 U/L (ref 0–44)
AST: 15 U/L (ref 15–41)
Albumin: 3.5 g/dL (ref 3.5–5.0)
Alkaline Phosphatase: 68 U/L (ref 38–126)
Anion gap: 9 (ref 5–15)
BUN: 8 mg/dL (ref 8–23)
CO2: 27 mmol/L (ref 22–32)
Calcium: 9.1 mg/dL (ref 8.9–10.3)
Chloride: 96 mmol/L — ABNORMAL LOW (ref 98–111)
Creatinine, Ser: 0.77 mg/dL (ref 0.44–1.00)
GFR, Estimated: >60 mL/min
Glucose, Bld: 119 mg/dL — ABNORMAL HIGH (ref 70–99)
Potassium: 3.8 mmol/L (ref 3.5–5.1)
Sodium: 132 mmol/L — ABNORMAL LOW (ref 135–145)
Total Bilirubin: 0.4 mg/dL (ref 0.0–1.2)
Total Protein: 5.6 g/dL — ABNORMAL LOW (ref 6.5–8.1)

## 2024-11-05 LAB — CULTURE, BLOOD (ROUTINE X 2): Special Requests: ADEQUATE

## 2024-11-05 MED ORDER — CHLORHEXIDINE GLUCONATE 0.12 % MT SOLN: 15.0000 mL | Freq: Once | OROMUCOSAL | Status: AC

## 2024-11-05 MED ORDER — POVIDONE-IODINE 10 % EX SWAB: 2.0000 | Freq: Once | CUTANEOUS | Status: DC

## 2024-11-05 MED ORDER — GABAPENTIN 400 MG PO CAPS
400.0000 mg | ORAL_CAPSULE | Freq: Two times a day (BID) | ORAL | Status: DC
  Administered 2024-11-05 – 2024-11-07 (×3): 400 mg via ORAL
  Filled 2024-11-05 (×5): qty 1

## 2024-11-05 MED ORDER — HYDROMORPHONE HCL 1 MG/ML IJ SOLN
INTRAMUSCULAR | Status: AC
  Filled 2024-11-05: qty 1

## 2024-11-05 MED ORDER — ONDANSETRON HCL 4 MG/2ML IJ SOLN
INTRAMUSCULAR | Status: DC | PRN
  Administered 2024-11-05: 4 mg via INTRAVENOUS

## 2024-11-05 MED ORDER — ORAL CARE MOUTH RINSE: 15.0000 mL | Freq: Once | OROMUCOSAL | Status: AC

## 2024-11-05 MED ORDER — BUPIVACAINE HCL (PF) 0.25 % IJ SOLN
INTRAMUSCULAR | Status: AC
  Filled 2024-11-05: qty 30

## 2024-11-05 MED ORDER — BUPIVACAINE HCL (PF) 0.25 % IJ SOLN
INTRAMUSCULAR | Status: DC | PRN
  Administered 2024-11-05: 30 mL

## 2024-11-05 MED ORDER — OXYCODONE HCL 5 MG PO TABS
5.0000 mg | ORAL_TABLET | Freq: Once | ORAL | Status: AC | PRN
  Administered 2024-11-05: 5 mg via ORAL

## 2024-11-05 MED ORDER — LACTATED RINGERS IV SOLN: INTRAVENOUS | Status: DC

## 2024-11-05 MED ORDER — TIZANIDINE HCL 4 MG PO TABS
4.0000 mg | ORAL_TABLET | Freq: Two times a day (BID) | ORAL | Status: DC
  Administered 2024-11-05 – 2024-11-07 (×5): 4 mg via ORAL
  Filled 2024-11-05 (×5): qty 1

## 2024-11-05 MED ORDER — PROPOFOL 10 MG/ML IV BOLUS
INTRAVENOUS | Status: AC
  Filled 2024-11-05: qty 20

## 2024-11-05 MED ORDER — OXYCODONE HCL 5 MG PO TABS
ORAL_TABLET | ORAL | Status: AC
  Filled 2024-11-05: qty 1

## 2024-11-05 MED ORDER — OXYCODONE HCL 5 MG/5ML PO SOLN: 5.0000 mg | Freq: Once | ORAL | Status: AC | PRN

## 2024-11-05 MED ORDER — CHLORHEXIDINE GLUCONATE 4 % EX SOLN: 60.0000 mL | Freq: Once | CUTANEOUS | Status: DC

## 2024-11-05 MED ORDER — AMISULPRIDE (ANTIEMETIC) 5 MG/2ML IV SOLN: 10.0000 mg | Freq: Once | INTRAVENOUS | Status: DC | PRN

## 2024-11-05 MED ORDER — DEXAMETHASONE SOD PHOSPHATE PF 10 MG/ML IJ SOLN
INTRAMUSCULAR | Status: DC | PRN
  Administered 2024-11-05: 10 mg via INTRAVENOUS

## 2024-11-05 MED ORDER — CEFAZOLIN SODIUM-DEXTROSE 2-4 GM/100ML-% IV SOLN
2.0000 g | INTRAVENOUS | Status: AC
  Administered 2024-11-05: 2 g via INTRAVENOUS

## 2024-11-05 MED ORDER — NORTRIPTYLINE HCL 25 MG PO CAPS
75.0000 mg | ORAL_CAPSULE | Freq: Every day | ORAL | Status: DC
  Administered 2024-11-05 – 2024-11-06 (×2): 75 mg via ORAL
  Filled 2024-11-05 (×3): qty 3

## 2024-11-05 MED ORDER — OXYCODONE HCL 5 MG PO TABS: 5.0000 mg | ORAL_TABLET | ORAL | Status: DC | PRN

## 2024-11-05 MED ORDER — ONDANSETRON HCL 4 MG/2ML IJ SOLN
INTRAMUSCULAR | Status: AC
  Filled 2024-11-05: qty 2

## 2024-11-05 MED ORDER — CHLORHEXIDINE GLUCONATE 0.12 % MT SOLN
OROMUCOSAL | Status: AC
  Administered 2024-11-05: 15 mL via OROMUCOSAL
  Filled 2024-11-05: qty 15

## 2024-11-05 MED ORDER — OXYCODONE HCL 5 MG PO TABS
5.0000 mg | ORAL_TABLET | ORAL | Status: DC | PRN
  Administered 2024-11-05 – 2024-11-07 (×5): 5 mg via ORAL
  Filled 2024-11-05 (×5): qty 1

## 2024-11-05 MED ORDER — CEFAZOLIN SODIUM-DEXTROSE 2-4 GM/100ML-% IV SOLN
INTRAVENOUS | Status: AC
  Filled 2024-11-05: qty 100

## 2024-11-05 MED ORDER — PENICILLIN G POTASSIUM 20000000 UNITS IJ SOLR
4.0000 10*6.[IU] | Freq: Four times a day (QID) | INTRAVENOUS | Status: DC
  Administered 2024-11-05 – 2024-11-07 (×9): 4 10*6.[IU] via INTRAVENOUS
  Filled 2024-11-05 (×13): qty 4

## 2024-11-05 MED ORDER — LIDOCAINE 2% (20 MG/ML) 5 ML SYRINGE
INTRAMUSCULAR | Status: DC | PRN
  Administered 2024-11-05: 60 mg via INTRAVENOUS

## 2024-11-05 MED ORDER — ESCITALOPRAM OXALATE 20 MG PO TABS
20.0000 mg | ORAL_TABLET | Freq: Every day | ORAL | Status: DC
  Administered 2024-11-05 – 2024-11-07 (×3): 20 mg via ORAL
  Filled 2024-11-05 (×3): qty 1

## 2024-11-05 MED ORDER — PROPOFOL 10 MG/ML IV BOLUS
INTRAVENOUS | Status: DC | PRN
  Administered 2024-11-05: 150 mg via INTRAVENOUS

## 2024-11-05 MED ORDER — HYDROMORPHONE HCL 1 MG/ML IJ SOLN
0.2500 mg | INTRAMUSCULAR | Status: DC | PRN
  Administered 2024-11-05 (×3): 0.5 mg via INTRAVENOUS

## 2024-11-05 MED ORDER — CALCIUM CARBONATE ANTACID 500 MG PO CHEW: 1.0000 | CHEWABLE_TABLET | Freq: Every day | ORAL | Status: DC | PRN

## 2024-11-05 MED ORDER — FENTANYL CITRATE (PF) 100 MCG/2ML IJ SOLN
INTRAMUSCULAR | Status: AC
  Filled 2024-11-05: qty 2

## 2024-11-05 MED ORDER — 0.9 % SODIUM CHLORIDE (POUR BTL) OPTIME
TOPICAL | Status: DC | PRN
  Administered 2024-11-05: 1000 mL

## 2024-11-05 MED ORDER — LIDOCAINE 2% (20 MG/ML) 5 ML SYRINGE
INTRAMUSCULAR | Status: AC
  Filled 2024-11-05: qty 5

## 2024-11-05 MED ORDER — FENTANYL CITRATE (PF) 250 MCG/5ML IJ SOLN
INTRAMUSCULAR | Status: DC | PRN
  Administered 2024-11-05: 25 ug via INTRAVENOUS
  Administered 2024-11-05: 50 ug via INTRAVENOUS
  Administered 2024-11-05: 25 ug via INTRAVENOUS

## 2024-11-05 NOTE — Op Note (Signed)
 NAME: Janaia Kozel MEDICAL RECORD NO: [id number] DATE OF BIRTH: August 17, 1946 FACILITY: [facility] LOCATION: [facility] OR PHYSICIAN: Chayse Zatarain R. Tarika Mckethan, MD   OPERATIVE REPORT   DATE OF PROCEDURE: 11/05/2024    PREOPERATIVE DIAGNOSIS: Right index finger infection   POSTOPERATIVE DIAGNOSIS: Index finger infection including DIP joint   PROCEDURE: Incision and drainage right index finger including DIP joint   SURGEON:  Franky Curia, M.D.   ASSISTANT: none   ANESTHESIA:  General   INTRAVENOUS FLUIDS:  Per anesthesia flow sheet.   ESTIMATED BLOOD LOSS:  Minimal.   COMPLICATIONS:  None.   SPECIMENS: Cultures to micro   TOURNIQUET TIME:    Total Tourniquet Time Documented: Upper Arm (Right) - 16 minutes Total: Upper Arm (Right) - 16 minutes    DISPOSITION:  Stable to PACU.   INDICATIONS: 78 year old female states she has had swelling and pain of the right index finger over the past week.  She has had a round of doxycycline.  She was seen at [facility] and did admitted for IV antibiotics.  Continued pain and swelling of the distal aspect of the index finger.  She thinks this may have started as a split in her skin.  Risks, benefits and alternatives of surgery were discussed including the risks of blood loss, infection, damage to nerves, vessels, tendons, ligaments, bone for surgery, need for additional surgery, complications with wound healing, continued pain, stiffness, , need for repeat irrigation and debridement.  She voiced understanding of these risks and elected to proceed.  OPERATIVE COURSE:  After being identified preoperatively by myself,  the patient and I agreed on the procedure and site of the procedure.  The surgical site was marked.  Surgical consent had been signed.  She is on scheduled antibiotics.  She was transferred to the operating room and placed on the operating table in supine position with the right upper extremity on an arm board.  General anesthesia was induced by  the anesthesiologist.  Right upper extremity was prepped and draped in normal sterile orthopedic fashion.  A surgical pause was performed between the surgeons, anesthesia, and operating room staff and all were in agreement as to the patient, procedure, and site of procedure.  Tourniquet at the proximal aspect of the extremity was inflated to 250 mmHg after exsanguination of the arm with an Esmarch bandage.  There was dried skin at the dorsum of the finger.  This was peeled off with the pickups.  There was erosion of the skin dorsally at the level of the DIP joint and over the distal phalanx.  There is exposure of the subcutaneous tissues.  An incision was made along the ulnar side of the finger onto the distal aspect of the middle phalanx.  This was carried in subcutaneous tissues by spreading technique.  Bipolar electrocautery is used to obtain hemostasis.  The DIP joint was exposed.  Cultures were taken for aerobes and anaerobes.  The wound was debrided of devitalized tissue.  There was still remaining subcutaneous tissues over the extensor tendon.  The skin was then at the radial and ulnar sides.  Any devitalized skin was sharply removed with the scissors.  The wound and joint were copiously irrigated with sterile saline by cystoscopy tubing.  Cordage iodoform packing was used to pack the wound and to place a wick in the DIP joint.  The raw edges of the wound were then dressed with sterile Xeroform leaving a area with packing for drainage.  The wound was then dressed with  sterile 4 x 4's and wrapped lightly with a Coban dressing.  An AlumaFoam splint was placed and wrapped lightly with Coban dressing.  Digital block was performed with quarter percent plain Marcaine  to aid in postoperative analgesia.  The tourniquet was deflated at 16 minutes.  Fingertips were pink with brisk capillary refill after deflation of tourniquet.  The operative  drapes were broken down.  The patient was awoken from anesthesia safely.   She was transferred back to the stretcher and taken to PACU in stable condition.  She is admitted to the hospitalist for IV antibiotics.  Will start local wound care in 2 to 4 days.   Kila Godina, MD Electronically signed, 11/05/2024

## 2024-11-05 NOTE — TOC CM/SW Note (Signed)
 Transition of Care [facility]) - Inpatient Brief Assessment   Patient Details  Name: Elzina Devera MRN: [id number] Date of Birth: 12-31-1945  Transition of Care [facility]) CM/SW Contact:    Tom-Johnson, Harvest Muskrat, RN Phone Number: 11/05/2024, 4:45 PM   Clinical Narrative:  Patient presented to the ED with Rt Index Finger Pain and Swelling. Ortho consulted, undergoing I&D to Rt Index Finger including DIP Joint  today 11/05/24. On IV abx.  CM spoke with patient at bedside about needs for post hospital transition. Patient states she lives alone with her pets. Has two children who lives out of [place]. Modified Independent with her care and drive self. Has a cane, walker, rollator, rails at home. PCP is [name], MD and uses [facility] on [address]   No ICM needs or recommendations noted at this time.  Patient not Medically ready for discharge.  CM will continue to follow as patient progresses with care towards discharge.       Transition of Care Asessment: Insurance and Status: Insurance coverage has been reviewed Patient has primary care physician: Yes Home environment has been reviewed: Yes Prior level of function:: Modified Independent Prior/Current Home Services: No current home services Social Drivers of Health Review: SDOH reviewed no interventions necessary Readmission risk has been reviewed: Yes Transition of care needs: no transition of care needs at this time

## 2024-11-05 NOTE — Discharge Instructions (Signed)

## 2024-11-05 NOTE — Plan of Care (Signed)
  Problem: Health Behavior/Discharge Planning: Goal: Ability to manage health-related needs will improve Outcome: Progressing   Problem: Activity: Goal: Risk for activity intolerance will decrease Outcome: Progressing   Problem: Nutrition: Goal: Adequate nutrition will be maintained Outcome: Progressing   

## 2024-11-05 NOTE — Plan of Care (Signed)
  Problem: Pain Managment: Goal: General experience of comfort will improve and/or be controlled Outcome: Progressing   Problem: Safety: Goal: Ability to remain free from injury will improve Outcome: Progressing   Problem: Skin Integrity: Goal: Risk for impaired skin integrity will decrease Outcome: Progressing

## 2024-11-05 NOTE — Transfer of Care (Signed)
 Immediate Anesthesia Transfer of Care Note  Patient: Tanya Duncan  Procedure(s) Performed: IRRIGATION AND DEBRIDEMENT RIGHT INDEX FINGER WOUND (Right: Finger)  Patient Location: PACU  Anesthesia Type:General  Level of Consciousness: awake and alert   Airway & Oxygen Therapy: Patient Spontanous Breathing  Post-op Assessment: Report given to RN and Post -op Vital signs reviewed and stable  Post vital signs: Reviewed and stable  Last Vitals:  Vitals Value Taken Time  BP 137/89 11/05/24 16:46  Temp    Pulse    Resp 9 11/05/24 16:50  SpO2    Vitals shown include unfiled device data.  Last Pain:  Vitals:   11/05/24 1407  TempSrc: Oral  PainSc:          Complications: No notable events documented.

## 2024-11-05 NOTE — Progress Notes (Signed)
 Postop note: Status post incision and drainage right index finger including DIP joint.  Loss of dorsal skin at DIP joint.  Start wound care in 2 to 4 days.  Okay for DVT prophylaxis.  Okay for discharge home when afebrile, white blood count trending to normal, pain controlled.

## 2024-11-05 NOTE — Consult Note (Addendum)
 Reason for Consult:Right index finger infection Referring Physician: Norval Bar Time called: 0730 Time at bedside: 0921   Tanya Duncan is an 78 y.o. female.  HPI: Tanya Duncan developed pain and swelling in her right index finger just over a week ago. She went to her PCP and was given doxy. She did not improve and presented to the ED and was admitted and orthopedic surgery was consulted. She is RHD and retired but does do a lot of gardening.  Past Medical History:  Diagnosis Date   Anxiety    Arthritis    Blood transfusion without reported diagnosis 05/2019   with hip surgery   Complication of anesthesia    Fibromyalgia    Fractured pelvis (HCC) 09/15/2020   right   GERD (gastroesophageal reflux disease)    GI bleed 8111,8009   x2   Hyperlipidemia    Palpitations    Pneumonia 2005   PONV (postoperative nausea and vomiting)    Pre-diabetes    Stomach ulcer 1990    Past Surgical History:  Procedure Laterality Date   ABDOMINAL HYSTERECTOMY     FRACTURE SURGERY Right 1998   ankle   JOINT REPLACEMENT     LUMBAR FUSION  2015   OPEN REDUCTION INTERNAL FIXATION (ORIF) PROXIMAL PHALANX Right 09/21/2020   Procedure: OPEN TREATMENT OF RIGHT LONG FINGER PROXIMAL PHALANX FRACTURE;  Surgeon: Sebastian Lenis, MD;  Location: [facility] OR;  Service: Orthopedics;  Laterality: Right;  LENGTH OF SURGERY: 75 MIN   ORIF HUMERUS FRACTURE Left    ORIF HUMERUS FRACTURE Left 05/15/2023   Procedure: OPEN REDUCTION INTERNAL FIXATION (ORIF) PERIPROSTHETIC HUMERUS FRACTURE WITH PLATE REMOVAL;  Surgeon: Dozier Soulier, MD;  Location: [facility] ORS;  Service: Orthopedics;  Laterality: Left;   ORIF TIBIA FRACTURE Right 1998   with bone graft   PYLOROPLASTY     REVERSE SHOULDER ARTHROPLASTY Left 02/04/2021   Procedure: REVERSE SHOULDER ARTHROPLASTY WITH HARDWARE REMOVAL;  Surgeon: Dozier Soulier, MD;  Location: [facility] ORS;  Service: Orthopedics;  Laterality: Left;   TOTAL HIP ARTHROPLASTY  2011   TOTAL HIP ARTHROPLASTY  Right 05/14/2019   Procedure: Right Anterior Hip Arthroplasty;  Surgeon: Sheril Coy, MD;  Location: [facility];  Service: Orthopedics;  Laterality: Right;   TOTAL SHOULDER REPLACEMENT  2014    Family History  Problem Relation Age of Onset   Colon cancer Father 6   Stomach cancer Paternal Uncle    Esophageal cancer Neg Hx    Rectal cancer Neg Hx     Social History:  reports that she has never smoked. She has never used smokeless tobacco. She reports that she does not currently use alcohol. She reports that she does not use drugs.  Allergies: Allergies[1]  Medications: I have reviewed the patient's current medications.  Results for orders placed or performed during the hospital encounter of 11/03/24 (from the past 48 hours)  CBC with Differential     Status: Abnormal   Collection Time: 11/03/24  4:37 PM  Result Value Ref Range   WBC 15.5 (H) 4.0 - 10.5 K/uL   RBC 3.69 (L) 3.87 - 5.11 MIL/uL   Hemoglobin 10.7 (L) 12.0 - 15.0 g/dL   HCT 67.3 (L) 63.9 - 53.9 %   MCV 88.3 80.0 - 100.0 fL   MCH 29.0 26.0 - 34.0 pg   MCHC 32.8 30.0 - 36.0 g/dL   RDW 86.5 88.4 - 84.4 %   Platelets 240 150 - 400 K/uL   nRBC 0.0 0.0 - 0.2 %  Neutrophils Relative % 86 %   Neutro Abs 13.5 (H) 1.7 - 7.7 K/uL   Lymphocytes Relative 8 %   Lymphs Abs 1.2 0.7 - 4.0 K/uL   Monocytes Relative 5 %   Monocytes Absolute 0.7 0.1 - 1.0 K/uL   Eosinophils Relative 0 %   Eosinophils Absolute 0.0 0.0 - 0.5 K/uL   Basophils Relative 0 %   Basophils Absolute 0.0 0.0 - 0.1 K/uL   Immature Granulocytes 1 %   Abs Immature Granulocytes 0.07 0.00 - 0.07 K/uL    Comment: Performed at [facility], [address] [place], [place] [address]  Basic metabolic panel     Status: Abnormal   Collection Time: 11/03/24  4:37 PM  Result Value Ref Range   Sodium 134 (L) 135 - 145 mmol/L   Potassium 3.8 3.5 - 5.1 mmol/L   Chloride 97 (L) 98 - 111 mmol/L   CO2 25 22 - 32 mmol/L   Glucose, Bld 123 (H) 70 - 99  mg/dL    Comment: Glucose reference range applies only to samples taken after fasting for at least 8 hours.   BUN 8 8 - 23 mg/dL   Creatinine, Ser 9.42 0.44 - 1.00 mg/dL   Calcium 9.8 8.9 - 89.6 mg/dL   GFR, Estimated >39 >39 mL/min    Comment: (NOTE) Calculated using the CKD-EPI Creatinine Equation (2021)    Anion gap 12 5 - 15    Comment: Performed at [facility], [address], [place], [place] [address]  Lactic acid, plasma     Status: None   Collection Time: 11/03/24  4:37 PM  Result Value Ref Range   Lactic Acid, Venous 0.8 0.5 - 1.9 mmol/L    Comment: Performed at [facility], [address], [place], [place] [address]  Type and screen     Status: None   Collection Time: 11/04/24  9:05 PM  Result Value Ref Range   ABO/RH(D) O POS    Antibody Screen NEG    Sample Expiration      11/07/2024,2359 Performed at [facility], [address] [place], [place] [address]   Comprehensive metabolic panel with GFR     Status: Abnormal   Collection Time: 11/04/24  9:07 PM  Result Value Ref Range   Sodium 134 (L) 135 - 145 mmol/L   Potassium 3.8 3.5 - 5.1 mmol/L   Chloride 96 (L) 98 - 111 mmol/L   CO2 29 22 - 32 mmol/L   Glucose, Bld 131 (H) 70 - 99 mg/dL    Comment: Glucose reference range applies only to samples taken after fasting for at least 8 hours.   BUN 9 8 - 23 mg/dL   Creatinine, Ser 8.86 (H) 0.44 - 1.00 mg/dL   Calcium 8.8 (L) 8.9 - 10.3 mg/dL   Total Protein 5.8 (L) 6.5 - 8.1 g/dL   Albumin 3.6 3.5 - 5.0 g/dL   AST 17 15 - 41 U/L   ALT 10 0 - 44 U/L   Alkaline Phosphatase 72 38 - 126 U/L   Total Bilirubin 0.4 0.0 - 1.2 mg/dL   GFR, Estimated 50 (L) >60 mL/min    Comment: (NOTE) Calculated using the CKD-EPI Creatinine Equation (2021)    Anion gap 8 5 - 15    Comment: Performed at [facility], [address] [place], [place] [address]  CBC with Differential/Platelet     Status: Abnormal   Collection Time:  11/04/24  9:07 PM  Result Value Ref Range   WBC 8.1 4.0 - 10.5 K/uL   RBC 3.42 (L) 3.87 - 5.11 MIL/uL   Hemoglobin 10.1 (L) 12.0 - 15.0 g/dL   HCT 68.4 (L) 63.9 - 53.9 %   MCV 92.1 80.0 - 100.0 fL   MCH 29.5 26.0 - 34.0 pg   MCHC 32.1 30.0 - 36.0 g/dL   RDW 86.5 88.4 - 84.4 %   Platelets 210 150 - 400 K/uL   nRBC 0.0 0.0 - 0.2 %   Neutrophils Relative % 72 %   Neutro Abs 5.9 1.7 - 7.7 K/uL   Lymphocytes Relative 17 %   Lymphs Abs 1.4 0.7 - 4.0 K/uL   Monocytes Relative 7 %   Monocytes Absolute 0.5 0.1 - 1.0 K/uL   Eosinophils Relative 3 %   Eosinophils Absolute 0.3 0.0 - 0.5 K/uL   Basophils Relative 0 %   Basophils Absolute 0.0 0.0 - 0.1 K/uL   Immature Granulocytes 1 %   Abs Immature Granulocytes 0.04 0.00 - 0.07 K/uL    Comment: Performed at [facility], [address] [place], [place] [address]  C-reactive protein     Status: Abnormal   Collection Time: 11/04/24  9:07 PM  Result Value Ref Range   CRP 18.3 (H) <1.0 mg/dL    Comment: Performed at [facility], [address] [place], [place] [address]  Comprehensive metabolic panel     Status: Abnormal   Collection Time: 11/05/24  3:15 AM  Result Value Ref Range   Sodium 132 (L) 135 - 145 mmol/L   Potassium 3.8 3.5 - 5.1 mmol/L   Chloride 96 (L) 98 - 111 mmol/L   CO2 27 22 - 32 mmol/L   Glucose, Bld 119 (H) 70 - 99 mg/dL    Comment: Glucose reference range applies only to samples taken after fasting for at least 8 hours.   BUN 8 8 - 23 mg/dL   Creatinine, Ser 9.22 0.44 - 1.00 mg/dL   Calcium 9.1 8.9 - 89.6 mg/dL   Total Protein 5.6 (L) 6.5 - 8.1 g/dL   Albumin 3.5 3.5 - 5.0 g/dL   AST 15 15 - 41 U/L   ALT 9 0 - 44 U/L   Alkaline Phosphatase 68 38 - 126 U/L   Total Bilirubin 0.4 0.0 - 1.2 mg/dL   GFR, Estimated >39 >39 mL/min    Comment: (NOTE) Calculated using the CKD-EPI Creatinine Equation (2021)    Anion gap 9 5 - 15    Comment: Performed at [facility], [address], [place],  [place] [address]  CBC     Status: Abnormal   Collection Time: 11/05/24  3:15 AM  Result Value Ref Range   WBC 8.2 4.0 - 10.5 K/uL   RBC 3.37 (L) 3.87 - 5.11 MIL/uL   Hemoglobin 10.0 (L) 12.0 - 15.0 g/dL   HCT 69.3 (L) 63.9 - 53.9 %   MCV 90.8 80.0 - 100.0 fL   MCH 29.7 26.0 - 34.0 pg   MCHC 32.7 30.0 - 36.0 g/dL   RDW 86.5 88.4 - 84.4 %   Platelets 207 150 - 400 K/uL   nRBC 0.0 0.0 - 0.2 %    Comment: Performed at [facility], [address] [place], [place] [address]    No results found.  Review of Systems  Constitutional:  Negative for chills, diaphoresis and fever.  HENT:  Negative for ear  discharge, ear pain, hearing loss and tinnitus.   Eyes:  Negative for photophobia and pain.  Respiratory:  Negative for cough and shortness of breath.   Cardiovascular:  Negative for chest pain.  Gastrointestinal:  Negative for abdominal pain, nausea and vomiting.  Genitourinary:  Negative for dysuria, flank pain, frequency and urgency.  Musculoskeletal:  Positive for arthralgias (Right index finger). Negative for back pain, myalgias and neck pain.  Neurological:  Negative for dizziness and headaches.  Hematological:  Does not bruise/bleed easily.  Psychiatric/Behavioral:  The patient is not nervous/anxious.    Blood pressure 136/85, pulse 78, temperature 98.2 F (36.8 C), resp. rate 18, height 5' 0.5 (1.537 m), weight 59.6 kg, SpO2 100%. Physical Exam Constitutional:      General: She is not in acute distress.    Appearance: She is well-developed. She is not diaphoretic.  HENT:     Head: Normocephalic and atraumatic.  Eyes:     General: No scleral icterus.       Right eye: No discharge.        Left eye: No discharge.     Conjunctiva/sclera: Conjunctivae normal.  Cardiovascular:     Rate and Rhythm: Normal rate and regular rhythm.  Pulmonary:     Effort: Pulmonary effort is normal. No respiratory distress.  Musculoskeletal:     Cervical back: Normal range of motion.      Comments: Right shoulder, elbow, wrist, digits- no skin wounds, index finger mod edematous, mild erythema save P3 with severe edema, erythema, and flucuance, rad/uln sensation paresthetic though this may be chronic from her RA, no instability, no blocks to motion  Sens  Ax/R/M/U intact  Mot   Ax/ R/ PIN/ M/ AIN/ U intact  Rad 2+  Skin:    General: Skin is warm and dry.  Neurological:     Mental Status: She is alert.  Psychiatric:        Mood and Affect: Mood normal.        Behavior: Behavior normal.     Assessment/Plan: Right index finger infection -- Plan I&D this afternoon with Dr. Murrell. Please keep NPO.    Ozell DOROTHA Ned, PA-C Orthopedic Surgery [phone] 11/05/2024, 9:32 AM   Addendum (11/05/2024): Patient seen and examined.  Agree with above. History: One week swelling and erythema of the index finger.  Has had a course of doxycycline without resolution.  She states it is painful at the distal aspect of the finger.  She does not remember any specific injuries but states she gets splints in the skin and thinks this is where it may have started. Exam: Intact sensation and cap refill in the fingertip.  Nontender volarly except at the DIP joint.  Swollen and erythematous dorsally particularly over the DIP joint.  Tender in the dorsum of the finger.  There is fluctuance.  Tender at the radial and ulnar sides of the DIP joint.  Nontender at the PIP joint or volarly over the middle phalanx.  She can flex the PIP joint without pain.  No proximal streaking. A/P: Right index finger infection possibly including DIP joint.  Recommend incision and drainage in the operating room including DIP joint.  Risks, benefits and alternatives of surgery were discussed including risks of blood loss, infection, damage to nerves/vessels/tendons/ligament/bone, failure of surgery, need for additional surgery, complication with wound healing, stiffness, need for repeat irrigation and debridement,  amputation.  She voiced understanding of these risks and elected to proceed.      [1]  Allergies Allergen Reactions   Atorvastatin Diarrhea   Bupropion Nausea And Vomiting   Duloxetine Hcl Nausea And Vomiting   Ibuprofen Other (See Comments)    Has had GI bleeds x2 in past   Sage [Salvia Officinalis] Palpitations and Other (See Comments)    Heart races, flu-like symptoms    Sulfa Antibiotics Nausea And Vomiting   Avelox [Moxifloxacin Hcl In Nacl] Palpitations   Indocin [Indomethacin] Other (See Comments)    Dizziness

## 2024-11-05 NOTE — Anesthesia Preprocedure Evaluation (Signed)
"                                    Anesthesia Evaluation  Patient identified by MRN, date of birth, ID band Patient awake    Reviewed: Allergy & Precautions, H&P , NPO status , Patient's Chart, lab work & pertinent test results  History of Anesthesia Complications (+) PONV and history of anesthetic complications  Airway Mallampati: II  TM Distance: >3 FB Neck ROM: Full    Dental  (+) Dental Advisory Given   Pulmonary neg pulmonary ROS   Pulmonary exam normal breath sounds clear to auscultation       Cardiovascular negative cardio ROS Normal cardiovascular exam Rhythm:Regular Rate:Normal     Neuro/Psych   Anxiety Depression    negative neurological ROS  negative psych ROS   GI/Hepatic Neg liver ROS,GERD  ,,  Endo/Other  negative endocrine ROS    Renal/GU negative Renal ROS  negative genitourinary   Musculoskeletal  (+) Arthritis , Osteoarthritis,  Fibromyalgia -  Abdominal   Peds negative pediatric ROS (+)  Hematology negative hematology ROS (+)   Anesthesia Other Findings   Reproductive/Obstetrics negative OB ROS                              Anesthesia Physical Anesthesia Plan  ASA: 3  Anesthesia Plan: General   Post-op Pain Management: Tylenol  PO (pre-op)* and Gabapentin  PO (pre-op)*   Induction: Intravenous  PONV Risk Score and Plan: 4 or greater and Ondansetron , Dexamethasone , Midazolam  and Treatment may vary due to age or medical condition  Airway Management Planned: LMA  Additional Equipment:   Intra-op Plan:   Post-operative Plan: Extubation in OR  Informed Consent: I have reviewed the patients History and Physical, chart, labs and discussed the procedure including the risks, benefits and alternatives for the proposed anesthesia with the patient or authorized representative who has indicated his/her understanding and acceptance.     Dental advisory given  Plan Discussed with: CRNA  Anesthesia  Plan Comments:         Anesthesia Quick Evaluation  "

## 2024-11-05 NOTE — Consult Note (Signed)
 "        [facility]    Date of Admission:  11/03/2024     Total days of antibiotics 1                Reason for Consult: GAS bacteremia     Referring Provider: Cosette Primary Care Provider: [name], MD   Assessment: Tanya Duncan is a 78 y.o. female admitted from home with:   Group A Streptococcal Bacteremia -  Abscess right pointer finger -  BCx (+) 12/27 in single blood culture set drawn. Source for infection is distal tip of right finger which will be debrided this afternoon with hand surgery. - Linezolid  added for toxin suppression for now.  - Will change cefazolin  to penicillin .  - FU surgery outcome / findings   Loose Stools -  Mushy texture from abx.  Hopefully will improve narrowing to PCN for her.  OK for anti-diarrheal if needed     Plan: Change cefazolin  to penicillin   Continue linezolid  600 mg BID for now FU surgery findings      Principal Problem:   Infection of index finger Active Problems:   Cellulitis of hand    escitalopram   20 mg Oral Daily   gabapentin   400 mg Oral BID   heparin   5,000 Units Subcutaneous Q12H   nortriptyline   75 mg Oral QHS   sodium chloride  flush  3 mL Intravenous Q12H   tiZANidine   4 mg Oral BID    HPI: Tanya Duncan is a 78 y.o. female admitted from home with concern over right finger infection.   PMHx anxiety, multiple replaced joints (B/L shoulder replacements, Hip replacements, ORIF of humerus, right #3 phalanx) and hardware in pelvis 2/2 fracture in 2021, HLD, palpitations, Stomach ulcer 1990, rheumatoid arthritis.   Noted a crack/fissure start on her finger due to dry skin. This progressed to where she went to her PCP on 12/22 and started on doxycycline. After no improvement she came to ER.  She had xray of digit that did not show any bone erosions. Ortho planning I&D this afternoon.  She has not any systemic signs of illness and denies fevers/rigors, nausea/vomiting or  weakness.   She frequently gardens. Right hand dominant. She does not smoke or use drugs.   Review of Systems: Review of Systems  Constitutional:  Negative for chills, fever and malaise/fatigue.  Respiratory: Negative.    Cardiovascular: Negative.   Gastrointestinal:  Negative for abdominal pain, nausea and vomiting.  Genitourinary: Negative.   Musculoskeletal:  Positive for joint pain.  Neurological:  Negative for dizziness and headaches.  All other systems reviewed and are negative.    Past Medical History:  Diagnosis Date   Anxiety    Arthritis    Blood transfusion without reported diagnosis 05/2019   with hip surgery   Complication of anesthesia    Fibromyalgia    Fractured pelvis (HCC) 09/15/2020   right   GERD (gastroesophageal reflux disease)    GI bleed 8111,8009   x2   Hyperlipidemia    Palpitations    Pneumonia 2005   PONV (postoperative nausea and vomiting)    Pre-diabetes    Stomach ulcer 1990    Social History[1]  Family History  Problem Relation Age of Onset   Colon cancer Father 82   Stomach cancer Paternal Uncle    Esophageal cancer Neg Hx    Rectal cancer Neg Hx    Allergies[2]  OBJECTIVE: Blood pressure 136/85, pulse 78,  temperature 98.2 F (36.8 C), resp. rate 18, height 5' 0.5 (1.537 m), weight 59.6 kg, SpO2 100%.  Physical Exam Vitals reviewed.  Constitutional:      Appearance: Normal appearance. She is not ill-appearing.  HENT:     Mouth/Throat:     Mouth: Mucous membranes are moist.     Pharynx: Oropharynx is clear.  Eyes:     General: No scleral icterus. Cardiovascular:     Rate and Rhythm: Normal rate and regular rhythm.     Heart sounds: No murmur heard. Pulmonary:     Effort: Pulmonary effort is normal.  Skin:    General: Skin is warm and dry.     Comments: Erythema has receded from hand, Finger remains swollen and red with large fluctuance on distal joint.   Neurological:     Mental Status: She is oriented to person,  place, and time.  Psychiatric:        Mood and Affect: Mood normal.        Thought Content: Thought content normal.      Lab Results Lab Results  Component Value Date   WBC 8.2 11/05/2024   HGB 10.0 (L) 11/05/2024   HCT 30.6 (L) 11/05/2024   MCV 90.8 11/05/2024   PLT 207 11/05/2024    Lab Results  Component Value Date   CREATININE 0.77 11/05/2024   BUN 8 11/05/2024   NA 132 (L) 11/05/2024   K 3.8 11/05/2024   CL 96 (L) 11/05/2024   CO2 27 11/05/2024    Lab Results  Component Value Date   ALT 9 11/05/2024   AST 15 11/05/2024   ALKPHOS 68 11/05/2024   BILITOT 0.4 11/05/2024     Microbiology: Recent Results (from the past 240 hours)  Blood culture (routine x 2)     Status: Abnormal   Collection Time: 11/02/24  5:40 PM   Specimen: BLOOD LEFT FOREARM  Result Value Ref Range Status   Specimen Description BLOOD LEFT FOREARM  Final   Special Requests   Final    BOTTLES DRAWN AEROBIC AND ANAEROBIC Blood Culture adequate volume   Culture  Setup Time   Final    GRAM POSITIVE COCCI IN BOTH AEROBIC AND ANAEROBIC BOTTLES CRITICAL RESULT CALLED TO, READ BACK BY AND VERIFIED WITH: PHARMD Warren NOVAK on [id number] @1245  by SM Performed at [facility], [address], [place], [place] [address]    Culture GROUP A STREP (S.PYOGENES) ISOLATED (A)  Final   Report Status 11/05/2024 FINAL  Final   Organism ID, Bacteria GROUP A STREP (S.PYOGENES) ISOLATED  Final      Susceptibility   Group a strep (s.pyogenes) isolated - MIC*    PENICILLIN  <=0.06 SENSITIVE Sensitive     CEFTRIAXONE <=0.12 SENSITIVE Sensitive     ERYTHROMYCIN <=0.12 SENSITIVE Sensitive     LEVOFLOXACIN 0.5 SENSITIVE Sensitive     VANCOMYCIN 0.5 SENSITIVE Sensitive     * GROUP A STREP (S.PYOGENES) ISOLATED  Blood Culture ID Panel (Reflexed)     Status: Abnormal   Collection Time: 11/02/24  5:40 PM  Result Value Ref Range Status   Enterococcus faecalis NOT DETECTED NOT DETECTED Final   Enterococcus Faecium NOT  DETECTED NOT DETECTED Final   Listeria monocytogenes NOT DETECTED NOT DETECTED Final   Staphylococcus species NOT DETECTED NOT DETECTED Final   Staphylococcus aureus (BCID) NOT DETECTED NOT DETECTED Final   Staphylococcus epidermidis NOT DETECTED NOT DETECTED Final   Staphylococcus lugdunensis NOT DETECTED NOT DETECTED Final  Streptococcus species DETECTED (A) NOT DETECTED Final    Comment: CRITICAL RESULT CALLED TO, READ BACK BY AND VERIFIED WITH: PHARMD Jamie B on [id number] @1245  by SM    Streptococcus agalactiae NOT DETECTED NOT DETECTED Final   Streptococcus pneumoniae NOT DETECTED NOT DETECTED Final   Streptococcus pyogenes DETECTED (A) NOT DETECTED Final    Comment: CRITICAL RESULT CALLED TO, READ BACK BY AND VERIFIED WITH: PHARMD Jamie B on [id number] @1245  by SM    A.calcoaceticus-baumannii NOT DETECTED NOT DETECTED Final   Bacteroides fragilis NOT DETECTED NOT DETECTED Final   Enterobacterales NOT DETECTED NOT DETECTED Final   Enterobacter cloacae complex NOT DETECTED NOT DETECTED Final   Escherichia coli NOT DETECTED NOT DETECTED Final   Klebsiella aerogenes NOT DETECTED NOT DETECTED Final   Klebsiella oxytoca NOT DETECTED NOT DETECTED Final   Klebsiella pneumoniae NOT DETECTED NOT DETECTED Final   Proteus species NOT DETECTED NOT DETECTED Final   Salmonella species NOT DETECTED NOT DETECTED Final   Serratia marcescens NOT DETECTED NOT DETECTED Final   Haemophilus influenzae NOT DETECTED NOT DETECTED Final   Neisseria meningitidis NOT DETECTED NOT DETECTED Final   Pseudomonas aeruginosa NOT DETECTED NOT DETECTED Final   Stenotrophomonas maltophilia NOT DETECTED NOT DETECTED Final   Candida albicans NOT DETECTED NOT DETECTED Final   Candida auris NOT DETECTED NOT DETECTED Final   Candida glabrata NOT DETECTED NOT DETECTED Final   Candida krusei NOT DETECTED NOT DETECTED Final   Candida parapsilosis NOT DETECTED NOT DETECTED Final   Candida tropicalis NOT DETECTED NOT DETECTED  Final   Cryptococcus neoformans/gattii NOT DETECTED NOT DETECTED Final    Comment: Performed at [facility], [address] [place], [place] [address]     [name], MSN, NP-C [facility]  [email or web address] Pager: [phone] Office: [phone] [facility] Main Line: [phone] *Secure Chat Communication Welcome  Total Encounter Time: 20 m     [1]  Social History Tobacco Use   Smoking status: Never   Smokeless tobacco: Never  Vaping Use   Vaping status: Never Used  Substance Use Topics   Alcohol use: Not Currently    Comment: rare   Drug use: Never  [2]  Allergies Allergen Reactions   Atorvastatin Diarrhea   Bupropion Nausea And Vomiting   Duloxetine Hcl Nausea And Vomiting   Ibuprofen Other (See Comments)    Has had GI bleeds x2 in past   Sage [Salvia Officinalis] Palpitations and Other (See Comments)    Heart races, flu-like symptoms    Sulfa Antibiotics Nausea And Vomiting   Avelox [Moxifloxacin Hcl In Nacl] Palpitations   Indocin [Indomethacin] Other (See Comments)    Dizziness   "

## 2024-11-05 NOTE — Anesthesia Procedure Notes (Signed)
 Procedure Name: LMA Insertion Date/Time: 11/05/2024 4:06 PM  Performed by: Julien Manus, CRNAPre-anesthesia Checklist: Patient identified, Emergency Drugs available, Suction available and Patient being monitored Patient Re-evaluated:Patient Re-evaluated prior to induction Oxygen Delivery Method: Circle System Utilized Preoxygenation: Pre-oxygenation with 100% oxygen Induction Type: IV induction Ventilation: Mask ventilation without difficulty LMA: LMA inserted LMA Size: 4.0 Number of attempts: 1 Airway Equipment and Method: Bite block Placement Confirmation: positive ETCO2 Tube secured with: Tape Dental Injury: Teeth and Oropharynx as per pre-operative assessment

## 2024-11-05 NOTE — Progress Notes (Signed)
 " PROGRESS NOTE    Tanya Duncan  FMW:[id number] DOB: 03/16/1946 DOA: 11/03/2024 PCP: [name], MD  Subjective: No acute events overnight. Seen and examined at bedside. Reports pain controlled. Has not eaten anything since midnight. Wondering if her home medications will be started soon. Denies nausea, vomiting, constipation.   Hospital Course:  78 year old female with past medical history of rheumatoid arthritis,Essential HTN, Hyperlipidemia, GAD, Anemia, Vit D Def and OA, hyponatremia, insomnia, GERD, prediabetes, primary care patient of [facility] seen at [facility] today for right index finger pain and swelling for the past 1 week status post outpatient doxycycline treatment with no improvement of symptoms.  X-ray imaging showed soft tissue swelling of the distal aspect of first MCP, Marked soft tissue swelling of the distal aspect of the index finger. 2. Degenerative changes of the 2nd distal interphalangeal joint with increased joint space and mild periosteal reaction along the distal aspect of the 2nd middle phalanx; septic joint and osteomyelitis are not excluded. 3. Severe degenerative changes of the second through fourth distal interphalangeal joints with severe joint space narrowing and osteophyte formation; moderate degenerative changes of the third digit. 4. ORIF of the proximal phalanx of the third digit in place.  Pt arrived today from [facility] and orthopedic Dr.Gebauer was consulted and has acknowledged pt and plan to cont abx and he will consult and manage.   Assessment and Plan:  Acute complicated R index finger cellulitis Possible septic arthritis and/or osteomyelitis See Xray findings as above Blood cultures 12/27 growing sensitive GAStrep Cont ancef  and linezolid  Plan for I&D on 12/30 Will repeat blood cultures Orthopedic surgery following ID team following  GAD Cont home lexapro  and nortriptyline  Cont home ativan  PRN  DVT prophylaxis: heparin  injection 5,000  Units Start: 11/04/24 2200      Code Status: Full Code  Disposition Plan: TBD Reason for continuing need for hospitalization: severity of illness  Objective: Vitals:   11/04/24 1936 11/05/24 0442 11/05/24 0500 11/05/24 0837  BP: 137/69 (!) 111/59  136/85  Pulse: 89 79  78  Resp: 19 19  18   Temp: 98.4 F (36.9 C) 98.4 F (36.9 C)  98.2 F (36.8 C)  TempSrc: Oral Oral    SpO2: 100% 98%  100%  Weight:   59.6 kg   Height:        Intake/Output Summary (Last 24 hours) at 11/05/2024 1209 Last data filed at 11/05/2024 0442 Gross per 24 hour  Intake 678.1 ml  Output 0 ml  Net 678.1 ml   Filed Weights   11/04/24 1002 11/04/24 1545 11/05/24 0500  Weight: 59.9 kg 59 kg 59.6 kg    Examination:  Physical Exam Vitals and nursing note reviewed.  Constitutional:      General: She is not in acute distress.    Appearance: She is ill-appearing.     Comments: Weak, frail  HENT:     Head: Normocephalic and atraumatic.  Cardiovascular:     Rate and Rhythm: Normal rate and regular rhythm.     Pulses: Normal pulses.     Heart sounds: Normal heart sounds.  Pulmonary:     Effort: Pulmonary effort is normal.     Breath sounds: Normal breath sounds.  Abdominal:     General: Bowel sounds are normal.     Palpations: Abdomen is soft.  Musculoskeletal:        General: Swelling (R index finger) present.  Skin:    Findings: Erythema (R index finger) present.  Neurological:  Mental Status: She is alert.     Data Reviewed: I have personally reviewed following labs and imaging studies  CBC: Recent Labs  Lab 11/02/24 1720 11/03/24 1637 11/04/24 2107 11/05/24 0315  WBC 13.6* 15.5* 8.1 8.2  NEUTROABS 10.4* 13.5* 5.9  --   HGB 10.9* 10.7* 10.1* 10.0*  HCT 33.1* 32.6* 31.5* 30.6*  MCV 90.4 88.3 92.1 90.8  PLT 260 240 210 207   Basic Metabolic Panel: Recent Labs  Lab 11/02/24 1720 11/03/24 1637 11/04/24 2107 11/05/24 0315  NA 132* 134* 134* 132*  K 3.6 3.8 3.8 3.8  CL  96* 97* 96* 96*  CO2 25 25 29 27   GLUCOSE 104* 123* 131* 119*  BUN 9 8 9 8   CREATININE 0.67 0.57 1.13* 0.77  CALCIUM 8.9 9.8 8.8* 9.1   GFR: Estimated Creatinine Clearance: 49 mL/min (by C-G formula based on SCr of 0.77 mg/dL). Liver Function Tests: Recent Labs  Lab 11/04/24 2107 11/05/24 0315  AST 17 15  ALT 10 9  ALKPHOS 72 68  BILITOT 0.4 0.4  PROT 5.8* 5.6*  ALBUMIN 3.6 3.5   No results for input(s): LIPASE, AMYLASE in the last 168 hours. No results for input(s): AMMONIA in the last 168 hours. Coagulation Profile: No results for input(s): INR, PROTIME in the last 168 hours. Cardiac Enzymes: No results for input(s): CKTOTAL, CKMB, CKMBINDEX, TROPONINI in the last 168 hours. ProBNP, BNP (last 5 results) No results for input(s): PROBNP, BNP in the last 8760 hours. HbA1C: No results for input(s): HGBA1C in the last 72 hours. CBG: No results for input(s): GLUCAP in the last 168 hours. Lipid Profile: No results for input(s): CHOL, HDL, LDLCALC, TRIG, CHOLHDL, LDLDIRECT in the last 72 hours. Thyroid Function Tests: No results for input(s): TSH, T4TOTAL, FREET4, T3FREE, THYROIDAB in the last 72 hours. Anemia Panel: No results for input(s): VITAMINB12, FOLATE, FERRITIN, TIBC, IRON, RETICCTPCT in the last 72 hours. Sepsis Labs: Recent Labs  Lab 11/02/24 1746 11/03/24 1637  LATICACIDVEN 1.1 0.8    Recent Results (from the past 240 hours)  Blood culture (routine x 2)     Status: Abnormal   Collection Time: 11/02/24  5:40 PM   Specimen: BLOOD LEFT FOREARM  Result Value Ref Range Status   Specimen Description BLOOD LEFT FOREARM  Final   Special Requests   Final    BOTTLES DRAWN AEROBIC AND ANAEROBIC Blood Culture adequate volume   Culture  Setup Time   Final    GRAM POSITIVE COCCI IN BOTH AEROBIC AND ANAEROBIC BOTTLES CRITICAL RESULT CALLED TO, READ BACK BY AND VERIFIED WITH: PHARMD Warren NOVAK on [id number] @1245  by  SM Performed at [facility], [address], [place], [place] [address]    Culture GROUP A STREP (S.PYOGENES) ISOLATED (A)  Final   Report Status 11/05/2024 FINAL  Final   Organism ID, Bacteria GROUP A STREP (S.PYOGENES) ISOLATED  Final      Susceptibility   Group a strep (s.pyogenes) isolated - MIC*    PENICILLIN  <=0.06 SENSITIVE Sensitive     CEFTRIAXONE <=0.12 SENSITIVE Sensitive     ERYTHROMYCIN <=0.12 SENSITIVE Sensitive     LEVOFLOXACIN 0.5 SENSITIVE Sensitive     VANCOMYCIN 0.5 SENSITIVE Sensitive     * GROUP A STREP (S.PYOGENES) ISOLATED  Blood Culture ID Panel (Reflexed)     Status: Abnormal   Collection Time: 11/02/24  5:40 PM  Result Value Ref Range Status   Enterococcus faecalis NOT DETECTED NOT DETECTED Final  Enterococcus Faecium NOT DETECTED NOT DETECTED Final   Listeria monocytogenes NOT DETECTED NOT DETECTED Final   Staphylococcus species NOT DETECTED NOT DETECTED Final   Staphylococcus aureus (BCID) NOT DETECTED NOT DETECTED Final   Staphylococcus epidermidis NOT DETECTED NOT DETECTED Final   Staphylococcus lugdunensis NOT DETECTED NOT DETECTED Final   Streptococcus species DETECTED (A) NOT DETECTED Final    Comment: CRITICAL RESULT CALLED TO, READ BACK BY AND VERIFIED WITH: PHARMD Jamie B on [id number] @1245  by SM    Streptococcus agalactiae NOT DETECTED NOT DETECTED Final   Streptococcus pneumoniae NOT DETECTED NOT DETECTED Final   Streptococcus pyogenes DETECTED (A) NOT DETECTED Final    Comment: CRITICAL RESULT CALLED TO, READ BACK BY AND VERIFIED WITH: PHARMD Jamie B on [id number] @1245  by SM    A.calcoaceticus-baumannii NOT DETECTED NOT DETECTED Final   Bacteroides fragilis NOT DETECTED NOT DETECTED Final   Enterobacterales NOT DETECTED NOT DETECTED Final   Enterobacter cloacae complex NOT DETECTED NOT DETECTED Final   Escherichia coli NOT DETECTED NOT DETECTED Final   Klebsiella aerogenes NOT DETECTED NOT DETECTED Final   Klebsiella oxytoca NOT  DETECTED NOT DETECTED Final   Klebsiella pneumoniae NOT DETECTED NOT DETECTED Final   Proteus species NOT DETECTED NOT DETECTED Final   Salmonella species NOT DETECTED NOT DETECTED Final   Serratia marcescens NOT DETECTED NOT DETECTED Final   Haemophilus influenzae NOT DETECTED NOT DETECTED Final   Neisseria meningitidis NOT DETECTED NOT DETECTED Final   Pseudomonas aeruginosa NOT DETECTED NOT DETECTED Final   Stenotrophomonas maltophilia NOT DETECTED NOT DETECTED Final   Candida albicans NOT DETECTED NOT DETECTED Final   Candida auris NOT DETECTED NOT DETECTED Final   Candida glabrata NOT DETECTED NOT DETECTED Final   Candida krusei NOT DETECTED NOT DETECTED Final   Candida parapsilosis NOT DETECTED NOT DETECTED Final   Candida tropicalis NOT DETECTED NOT DETECTED Final   Cryptococcus neoformans/gattii NOT DETECTED NOT DETECTED Final    Comment: Performed at [facility], [address] [place], [place] [address]     Radiology Studies: No results found.  Scheduled Meds:  escitalopram   20 mg Oral Daily   gabapentin   400 mg Oral BID   heparin   5,000 Units Subcutaneous Q12H   nortriptyline   75 mg Oral QHS   sodium chloride  flush  3 mL Intravenous Q12H   tiZANidine   4 mg Oral BID   Continuous Infusions:  sodium chloride  30 mL/hr at 11/04/24 1717   linezolid  (ZYVOX ) IV 600 mg (11/05/24 0923)   pencillin G potassium IV 4 Million Units (11/05/24 1135)     LOS: 1 day    Norval Bar, MD  [facility]  11/05/2024, 12:09 PM   "

## 2024-11-06 ENCOUNTER — Telehealth (HOSPITAL_BASED_OUTPATIENT_CLINIC_OR_DEPARTMENT_OTHER): Payer: Self-pay | Admitting: *Deleted

## 2024-11-06 ENCOUNTER — Encounter (HOSPITAL_COMMUNITY): Payer: Self-pay | Admitting: Orthopedic Surgery

## 2024-11-06 DIAGNOSIS — L02511 Cutaneous abscess of right hand: Secondary | ICD-10-CM | POA: Diagnosis not present

## 2024-11-06 DIAGNOSIS — R7881 Bacteremia: Secondary | ICD-10-CM | POA: Diagnosis not present

## 2024-11-06 DIAGNOSIS — B950 Streptococcus, group A, as the cause of diseases classified elsewhere: Secondary | ICD-10-CM | POA: Diagnosis not present

## 2024-11-06 DIAGNOSIS — R197 Diarrhea, unspecified: Secondary | ICD-10-CM | POA: Diagnosis not present

## 2024-11-06 DIAGNOSIS — L089 Local infection of the skin and subcutaneous tissue, unspecified: Secondary | ICD-10-CM | POA: Diagnosis not present

## 2024-11-06 LAB — CBC WITH DIFFERENTIAL/PLATELET
Abs Immature Granulocytes: 0.02 K/uL (ref 0.00–0.07)
Basophils Absolute: 0 K/uL (ref 0.0–0.1)
Basophils Relative: 0 %
Eosinophils Absolute: 0 K/uL (ref 0.0–0.5)
Eosinophils Relative: 0 %
HCT: 33.1 % — ABNORMAL LOW (ref 36.0–46.0)
Hemoglobin: 10.9 g/dL — ABNORMAL LOW (ref 12.0–15.0)
Immature Granulocytes: 0 %
Lymphocytes Relative: 18 %
Lymphs Abs: 1.2 K/uL (ref 0.7–4.0)
MCH: 29.9 pg (ref 26.0–34.0)
MCHC: 32.9 g/dL (ref 30.0–36.0)
MCV: 90.7 fL (ref 80.0–100.0)
Monocytes Absolute: 0.3 K/uL (ref 0.1–1.0)
Monocytes Relative: 4 %
Neutro Abs: 5.1 K/uL (ref 1.7–7.7)
Neutrophils Relative %: 78 %
Platelets: 191 K/uL (ref 150–400)
RBC: 3.65 MIL/uL — ABNORMAL LOW (ref 3.87–5.11)
RDW: 13 % (ref 11.5–15.5)
WBC: 6.6 K/uL (ref 4.0–10.5)
nRBC: 0 % (ref 0.0–0.2)

## 2024-11-06 LAB — BASIC METABOLIC PANEL WITH GFR
Anion gap: 10 (ref 5–15)
BUN: 13 mg/dL (ref 8–23)
CO2: 26 mmol/L (ref 22–32)
Calcium: 9.5 mg/dL (ref 8.9–10.3)
Chloride: 95 mmol/L — ABNORMAL LOW (ref 98–111)
Creatinine, Ser: 0.64 mg/dL (ref 0.44–1.00)
GFR, Estimated: >60 mL/min
Glucose, Bld: 194 mg/dL — ABNORMAL HIGH (ref 70–99)
Potassium: 4.5 mmol/L (ref 3.5–5.1)
Sodium: 131 mmol/L — ABNORMAL LOW (ref 135–145)

## 2024-11-06 MED ORDER — DIAZEPAM 5 MG PO TABS
5.0000 mg | ORAL_TABLET | Freq: Once | ORAL | Status: AC
  Administered 2024-11-06: 5 mg via ORAL
  Filled 2024-11-06: qty 1

## 2024-11-06 MED ORDER — FAMOTIDINE 20 MG PO TABS
40.0000 mg | ORAL_TABLET | Freq: Every day | ORAL | Status: DC
  Administered 2024-11-06: 40 mg via ORAL
  Filled 2024-11-06: qty 2

## 2024-11-06 MED ORDER — MEPERIDINE HCL 50 MG/ML IJ SOLN: 50.0000 mg | Freq: Once | INTRAMUSCULAR | Status: DC | PRN

## 2024-11-06 MED ORDER — ONDANSETRON HCL 4 MG/2ML IJ SOLN: 4.0000 mg | Freq: Once | INTRAMUSCULAR | Status: DC | PRN

## 2024-11-06 MED ORDER — MORPHINE SULFATE (PF) 2 MG/ML IV SOLN
2.0000 mg | Freq: Once | INTRAVENOUS | Status: AC
  Administered 2024-11-06: 2 mg via INTRAVENOUS
  Filled 2024-11-06: qty 1

## 2024-11-06 NOTE — Telephone Encounter (Signed)
 Post ED Visit - Positive Culture Follow-up  Culture report reviewed by antimicrobial stewardship pharmacist: [facility] [x] [place], [place].D. []  Venetia Gully, Pharm.D., BCPS AQ-ID []  Garrel Crews, Pharm.D., BCPS []  Almarie Lunger, [address].D., BCPS []  Naylor, [address].D., BCPS, AAHIVP []  Rosaline Bihari, Pharm.D., BCPS, AAHIVP []  Vernell Meier, PharmD, BCPS []  Latanya Hint, PharmD, BCPS []  Donald Medley, PharmD, BCPS []  Rocky Bold, PharmD []  Dorothyann Alert, PharmD, BCPS []  Morene Babe, PharmD  [facility] []  Rosaline Edison, PharmD []  Romona Bliss, PharmD []  Dolphus Roller, PharmD []  Veva Seip, Rph []  Vernell Daunt) Leonce, PharmD []  Eva Allis, PharmD []  Rosaline Millet, PharmD []  Iantha Batch, PharmD []  Arvin Gauss, PharmD []  Wanda Hasting, PharmD []  Ronal Rav, PharmD []  Rocky Slade, PharmD []  Bard Jeans, PharmD   Positive blood culture Pt currently admitted and no further patient follow-up is required at this time.  Jama Lisle Blondie 11/06/2024, 1:07 PM

## 2024-11-06 NOTE — Progress Notes (Signed)
 "        [facility]  Date of Admission:  11/03/2024      Total days of antibiotics 2          ASSESSMENT: Tanya Duncan is a 78 y.o. female admitted from home with:    Group A Streptococcal Bacteremia -  Abscess right pointer finger, s/p I&D 12/30 - BCx (+) 12/27 in single blood culture set drawn. Source for infection is distal tip of right finger which was well debrided. OR note reviewed and tracked down deep but all soft tissue involvement. Will plan for 4 weeks of treatment with oral antibiotics. She has clinically improved and leukocytosis resolved, afebrile. Can continue IV penicillin  and PO linezolid  while inpatient and plan for D/C on course of linezolid  until outpatient follow up.  - continue IV penicillin  while admitted  - no tte needed as this is not a sticky strep we would worry about for her  - continue linezolid  - plan to send home with this as monotherapy until follow up with me   Medication Monitoring -  Will see her in follow up at 2 week mark to determine if OK to continue with linezolid  vs high dose amoxicillin at that time to finish out duration of treatment.  Revived other PTA meds for any interactions.    Loose Stools -  Mushy texture from abx.  Hopefully will improve narrowing to PCN for her.  OK for anti-diarrheal if needed   Isolation recommendations -  Standard  OK for D/C from ID perspective when Ortho team clears her and would care recs are provided.   ID will sign off - please call back with any questions/concerns or if we can be of further assistance. Communication sent to primary team    PLAN: - continue IV penicillin  while admitted  - no tte needed as this is not a concerning species for IE - continue linezolid  - plan to send home with this as monotherapy until follow up with me on 1/15 @ 1:00 pm    Principal Problem:   Infection of index finger Active Problems:   Cellulitis of hand   Bacteremia     escitalopram   20 mg Oral Daily   gabapentin   400 mg Oral BID   heparin   5,000 Units Subcutaneous Q12H   nortriptyline   75 mg Oral QHS   sodium chloride  flush  3 mL Intravenous Q12H   tiZANidine   4 mg Oral BID    SUBJECTIVE: Feeling better. Spent time discussing the nature of bacterial infection and type of strep she has.  No F/C.  TOlerating abx well w/o concerns  Review of Systems: Review of Systems  Constitutional:  Negative for chills and fever.  Gastrointestinal:  Negative for abdominal pain, diarrhea, nausea and vomiting.  Musculoskeletal:  Positive for joint pain (improved, just sore).    Allergies[1]  OBJECTIVE: Vitals:   11/05/24 2009 11/06/24 0439 11/06/24 0500 11/06/24 0818  BP: (!) 150/83 117/63  (!) 145/78  Pulse: (!) 105 70  87  Resp: 18 18  18   Temp: 98.3 F (36.8 C) 98.3 F (36.8 C)  98.6 F (37 C)  TempSrc:      SpO2: 97% 97%  100%  Weight:   59.5 kg   Height:       Body mass index is 23.99 kg/m.  Physical Exam Constitutional:      Appearance: Normal appearance.  Cardiovascular:     Rate and Rhythm: Normal rate.  Pulmonary:  Effort: Pulmonary effort is normal.     Breath sounds: Normal breath sounds.  Abdominal:     General: Bowel sounds are normal. There is no distension.     Tenderness: There is no abdominal tenderness.  Musculoskeletal:     Comments: Right pointer finger wrapped in clean dressings from OR  Skin:    General: Skin is warm and dry.     Findings: No rash.  Neurological:     Mental Status: She is alert.     Lab Results Lab Results  Component Value Date   WBC 6.6 11/06/2024   HGB 10.9 (L) 11/06/2024   HCT 33.1 (L) 11/06/2024   MCV 90.7 11/06/2024   PLT 191 11/06/2024    Lab Results  Component Value Date   CREATININE 0.64 11/06/2024   BUN 13 11/06/2024   NA 131 (L) 11/06/2024   K 4.5 11/06/2024   CL 95 (L) 11/06/2024   CO2 26 11/06/2024    Lab Results  Component Value Date   ALT 9 11/05/2024   AST 15  11/05/2024   ALKPHOS 68 11/05/2024   BILITOT 0.4 11/05/2024     Microbiology: Recent Results (from the past 240 hours)  Blood culture (routine x 2)     Status: Abnormal   Collection Time: 11/02/24  5:40 PM   Specimen: BLOOD LEFT FOREARM  Result Value Ref Range Status   Specimen Description BLOOD LEFT FOREARM  Final   Special Requests   Final    BOTTLES DRAWN AEROBIC AND ANAEROBIC Blood Culture adequate volume   Culture  Setup Time   Final    GRAM POSITIVE COCCI IN BOTH AEROBIC AND ANAEROBIC BOTTLES CRITICAL RESULT CALLED TO, READ BACK BY AND VERIFIED WITH: PHARMD Jamie B on [id number] @1245  by SM Performed at [facility], [address], [place], [place] [address]    Culture GROUP A STREP (S.PYOGENES) ISOLATED (A)  Final   Report Status 11/05/2024 FINAL  Final   Organism ID, Bacteria GROUP A STREP (S.PYOGENES) ISOLATED  Final      Susceptibility   Group a strep (s.pyogenes) isolated - MIC*    PENICILLIN  <=0.06 SENSITIVE Sensitive     CEFTRIAXONE <=0.12 SENSITIVE Sensitive     ERYTHROMYCIN <=0.12 SENSITIVE Sensitive     LEVOFLOXACIN 0.5 SENSITIVE Sensitive     VANCOMYCIN 0.5 SENSITIVE Sensitive     * GROUP A STREP (S.PYOGENES) ISOLATED  Blood Culture ID Panel (Reflexed)     Status: Abnormal   Collection Time: 11/02/24  5:40 PM  Result Value Ref Range Status   Enterococcus faecalis NOT DETECTED NOT DETECTED Final   Enterococcus Faecium NOT DETECTED NOT DETECTED Final   Listeria monocytogenes NOT DETECTED NOT DETECTED Final   Staphylococcus species NOT DETECTED NOT DETECTED Final   Staphylococcus aureus (BCID) NOT DETECTED NOT DETECTED Final   Staphylococcus epidermidis NOT DETECTED NOT DETECTED Final   Staphylococcus lugdunensis NOT DETECTED NOT DETECTED Final   Streptococcus species DETECTED (A) NOT DETECTED Final    Comment: CRITICAL RESULT CALLED TO, READ BACK BY AND VERIFIED WITH: PHARMD Jamie B on [id number] @1245  by SM    Streptococcus agalactiae NOT DETECTED NOT  DETECTED Final   Streptococcus pneumoniae NOT DETECTED NOT DETECTED Final   Streptococcus pyogenes DETECTED (A) NOT DETECTED Final    Comment: CRITICAL RESULT CALLED TO, READ BACK BY AND VERIFIED WITH: PHARMD Jamie B on [id number] @1245  by SM    A.calcoaceticus-baumannii NOT DETECTED NOT DETECTED Final   Bacteroides fragilis NOT  DETECTED NOT DETECTED Final   Enterobacterales NOT DETECTED NOT DETECTED Final   Enterobacter cloacae complex NOT DETECTED NOT DETECTED Final   Escherichia coli NOT DETECTED NOT DETECTED Final   Klebsiella aerogenes NOT DETECTED NOT DETECTED Final   Klebsiella oxytoca NOT DETECTED NOT DETECTED Final   Klebsiella pneumoniae NOT DETECTED NOT DETECTED Final   Proteus species NOT DETECTED NOT DETECTED Final   Salmonella species NOT DETECTED NOT DETECTED Final   Serratia marcescens NOT DETECTED NOT DETECTED Final   Haemophilus influenzae NOT DETECTED NOT DETECTED Final   Neisseria meningitidis NOT DETECTED NOT DETECTED Final   Pseudomonas aeruginosa NOT DETECTED NOT DETECTED Final   Stenotrophomonas maltophilia NOT DETECTED NOT DETECTED Final   Candida albicans NOT DETECTED NOT DETECTED Final   Candida auris NOT DETECTED NOT DETECTED Final   Candida glabrata NOT DETECTED NOT DETECTED Final   Candida krusei NOT DETECTED NOT DETECTED Final   Candida parapsilosis NOT DETECTED NOT DETECTED Final   Candida tropicalis NOT DETECTED NOT DETECTED Final   Cryptococcus neoformans/gattii NOT DETECTED NOT DETECTED Final    Comment: Performed at [facility], [address] [place], [place] [address]  Culture, blood (Routine X 2) w Reflex to ID Panel     Status: None (Preliminary result)   Collection Time: 11/05/24 12:48 PM   Specimen: BLOOD  Result Value Ref Range Status   Specimen Description BLOOD SITE NOT SPECIFIED  Final   Special Requests   Final    BOTTLES DRAWN AEROBIC AND ANAEROBIC Blood Culture adequate volume   Culture   Final    NO GROWTH < 24  HOURS Performed at [facility], [address] [place], [place] [address]    Report Status PENDING  Incomplete  Culture, blood (Routine X 2) w Reflex to ID Panel     Status: None (Preliminary result)   Collection Time: 11/05/24 12:55 PM   Specimen: BLOOD  Result Value Ref Range Status   Specimen Description BLOOD SITE NOT SPECIFIED  Final   Special Requests   Final    BOTTLES DRAWN AEROBIC ONLY Blood Culture results may not be optimal due to an inadequate volume of blood received in culture bottles   Culture   Final    NO GROWTH < 24 HOURS Performed at [facility], [address], [place], [place] [address]    Report Status PENDING  Incomplete  Aerobic/Anaerobic Culture w Gram Stain (surgical/deep wound)     Status: None (Preliminary result)   Collection Time: 11/05/24  4:21 PM   Specimen: Soft Tissue, Other  Result Value Ref Range Status   Specimen Description WOUND RIGHT FINGER  Final   Special Requests RT INDEX PT ON ANCEF   Final   Gram Stain   Final    FEW WBC PRESENT, PREDOMINANTLY PMN RARE GRAM POSITIVE COCCI    Culture   Final    NO GROWTH < 12 HOURS Performed at [facility], [address] [place], [place] [address]    Report Status PENDING  Incomplete     [name], MSN, NP-C [facility]  [email or web address] Pager: [phone] Office: [phone] [facility] Main Line: [phone] *Secure Chat Communication Welcome      [1]  Allergies Allergen Reactions   Atorvastatin Diarrhea   Bupropion Nausea And Vomiting   Duloxetine Hcl Nausea And Vomiting   Ibuprofen Other (See Comments)    Has had GI bleeds x2 in past  Emelia Sprawls Officinalis] Palpitations and Other (See Comments)    Heart races, flu-like symptoms    Sulfa Antibiotics Nausea And Vomiting   Avelox [Moxifloxacin Hcl In Nacl] Palpitations   Indocin [Indomethacin] Other (See Comments)    Dizziness   "

## 2024-11-06 NOTE — TOC Progression Note (Signed)
 Transition of Care [facility]) - Progression Note    Patient Details  Name: Tanya Duncan MRN: [id number] Date of Birth: 03-12-1946  Transition of [facility] CM/SW Contact  Tom-Johnson, Dakhari Zuver Daphne, RN Phone Number: 11/06/2024, 4:28 PM  Clinical Narrative:     Patient requesting home health RN/Aide. MD notified, referral sent via hub, patient has no preference.   CM will continue to follow as patient progresses with care towards discharge.                         Expected Discharge Plan and Services                                               Social Drivers of Health (SDOH) Interventions SDOH Screenings   Food Insecurity: No Food Insecurity (11/04/2024)  Housing: Low Risk (11/04/2024)  Transportation Needs: No Transportation Needs (11/04/2024)  Utilities: Not At Risk (11/04/2024)  Financial Resource Strain: High Risk (02/01/2024)   Received from [facility]  Physical Activity: Insufficiently Active (02/01/2024)   Received from [facility]  Social Connections: Socially Isolated (11/04/2024)  Stress: No Stress Concern Present (02/01/2024)   Received from [facility]  Tobacco Use: Low Risk (11/05/2024)    Readmission Risk Interventions    11/05/2024    4:44 PM  Readmission Risk Prevention Plan  Post Dischage Appt Complete  Medication Screening Complete  Transportation Screening Complete

## 2024-11-06 NOTE — Progress Notes (Signed)
 [facility]  Tanya Duncan FMW:[id number]  DOB: Apr 16, 1946  DOA: 11/03/2024  PCP: [name], MD  11/06/2024,12:57 PM  LOS: 2 days    Code Status: Full code     from: Home   78 year old white female high functioning Previous ORIF periprosthetic fracture 05/15/2023 with plate removal (Dr. Dozier) Remote history of GI bleed and peritonitis from ruptured gastric ulcer remotely several years ago Depression Fibromyalgia Rheumatoid arthritis follows with [facility] Lauraine Burkes Spondylolisthesis on pain meds oxycodone  Cymbalta Prediabetes  Developed right index finger pain middle of December hide PCP given doxycycline Did not improve Came to hospital second distal interphalangeal joint increased joint space periosteal reaction and thought to have infection cellulitis of the right index finger  12/30 underwent right index finger DIP joint incision and drainage  ID following    Assessment  & Plan :    Right index finger infection status post I&D 12/30 Recent periprosthetic hip fracture with removal of plate 12/08/7973 Dr. Dozier Pain seems moderately controlled-secondary to digital block.  Operatively, Tylenol  3 Story 650 for pain-discontinue Demerol  do not use this medication--- can continue gabapentin  400 twice daily Zanaflex  4 mg twice daily ID guiding antibiotics continue linezolid  600 every 12, penicillin  4,000,000 units Q6 --await deep wound from 12/30 culture Unclear if she needs additive rifaximin other agent because of suspected PJI Can saline lock IV  Depression-continue Lexapro  20 mg Ativan  1 mg every 8 Pamelor  75 at bedtime  Previous history of GI bleed Stable at this time  Rheumatoid arthritis with significant joint deformities Not on any DMA RD  Data Reviewed today: Sodium 131 potassium 4.5 chloride 95 CO2 26 BUN/creatinine 13/0.6 WBC 6.6 hemoglobin 10.9 platelet 191  DVT prophylaxis: Heparin  5000 every 12   Dispo/Global plan: Await antibiotic titration downwards      Subjective:   No distress pain is controlled no fever no chills no nausea vomiting ROM intact-fingers well bandaged and wrapped with reinforced dressing She is unsure about hospital at home  Objective + exam Vitals:   11/05/24 2009 11/06/24 0439 11/06/24 0500 11/06/24 0818  BP: (!) 150/83 117/63  (!) 145/78  Pulse: (!) 105 70  87  Resp: 18 18  18   Temp: 98.3 F (36.8 C) 98.3 F (36.8 C)  98.6 F (37 C)  TempSrc:      SpO2: 97% 97%  100%  Weight:   59.5 kg   Height:       Filed Weights   11/05/24 0500 11/05/24 1407 11/06/24 0500  Weight: 59.6 kg 59.6 kg 59.5 kg     Examination: Awake coherent no distress EOMI NCAT no focal deficit Chest is clear Right index finger not evaluated as wrapped Power is intact, 5/5 reflexes not tested chest is clear  Scheduled Meds:  escitalopram   20 mg Oral Daily   gabapentin   400 mg Oral BID   heparin   5,000 Units Subcutaneous Q12H   nortriptyline   75 mg Oral QHS   sodium chloride  flush  3 mL Intravenous Q12H   tiZANidine   4 mg Oral BID   Continuous Infusions:  sodium chloride  Stopped (11/05/24 1236)   linezolid  (ZYVOX ) IV 600 mg (11/06/24 0812)   pencillin G potassium IV 4 Million Units (11/06/24 1107)   acetaminophen  **OR** acetaminophen , calcium carbonate, hydrALAZINE, LORazepam , meperidine  (DEMEROL ) injection, ondansetron , oxyCODONE   Time 46  Jai-Gurmukh Emilina Smarr, MD  [facility]

## 2024-11-06 NOTE — Plan of Care (Signed)
" ° °  Patient asking to increase oxycodone  10 mg every 4 hour as needed to control pain.  Increasing oxycodone  10 mg every 4 hour as needed will be total 60 mg in 24-hour which is a very high dose of narcotics and it will place patient high risk for development of respiratory depression.  I will avoid such a high dose of oral narcotics as per patient request.  -Plan to continue oxycodone  5 mg every 4 hour as needed as it is now and giving one-time dose of IV morphine  2 mg. "

## 2024-11-06 NOTE — Plan of Care (Signed)
  Problem: Activity: Goal: Risk for activity intolerance will decrease Outcome: Progressing   Problem: Pain Managment: Goal: General experience of comfort will improve and/or be controlled Outcome: Progressing   Problem: Education: Goal: Knowledge of the prescribed therapeutic regimen will improve Outcome: Progressing

## 2024-11-06 NOTE — Care Management Important Message (Signed)
 Important Message  Patient Details  Name: Tanya Duncan MRN: [id number] Date of Birth: 12/07/1945   Important Message Given:  Yes - Medicare IM     Claretta Deed 11/06/2024, 10:34 AM

## 2024-11-06 NOTE — Plan of Care (Signed)
  Problem: Clinical Measurements: Goal: Ability to maintain clinical measurements within normal limits will improve Outcome: Progressing Goal: Respiratory complications will improve Outcome: Progressing   Problem: Activity: Goal: Risk for activity intolerance will decrease Outcome: Progressing   

## 2024-11-06 NOTE — Anesthesia Postprocedure Evaluation (Signed)
"   Anesthesia Post Note  Patient: Tanya Duncan  Procedure(s) Performed: IRRIGATION AND DEBRIDEMENT RIGHT INDEX FINGER WOUND (Right: Finger)     Patient location during evaluation: PACU Anesthesia Type: General Level of consciousness: awake and alert Pain management: pain level controlled Vital Signs Assessment: post-procedure vital signs reviewed and stable Respiratory status: spontaneous breathing, nonlabored ventilation and respiratory function stable Cardiovascular status: blood pressure returned to baseline and stable Postop Assessment: no apparent nausea or vomiting Anesthetic complications: no   No notable events documented.  Last Vitals:  Vitals:   11/06/24 0818 11/06/24 1649  BP: (!) 145/78 113/64  Pulse: 87 81  Resp: 18 18  Temp: 37 C 36.9 C  SpO2: 100% 99%    Last Pain:  Vitals:   11/06/24 1722  TempSrc:   PainSc: 6    Pain Goal:                   [name]      "

## 2024-11-07 DIAGNOSIS — L089 Local infection of the skin and subcutaneous tissue, unspecified: Secondary | ICD-10-CM | POA: Diagnosis not present

## 2024-11-07 DIAGNOSIS — M0020 Other streptococcal arthritis, unspecified joint: Secondary | ICD-10-CM

## 2024-11-07 LAB — CBC WITH DIFFERENTIAL/PLATELET
Abs Immature Granulocytes: 0.02 K/uL (ref 0.00–0.07)
Basophils Absolute: 0 K/uL (ref 0.0–0.1)
Basophils Relative: 0 %
Eosinophils Absolute: 0.1 K/uL (ref 0.0–0.5)
Eosinophils Relative: 1 %
HCT: 29.5 % — ABNORMAL LOW (ref 36.0–46.0)
Hemoglobin: 9.9 g/dL — ABNORMAL LOW (ref 12.0–15.0)
Immature Granulocytes: 0 %
Lymphocytes Relative: 33 %
Lymphs Abs: 2.3 K/uL (ref 0.7–4.0)
MCH: 30 pg (ref 26.0–34.0)
MCHC: 33.6 g/dL (ref 30.0–36.0)
MCV: 89.4 fL (ref 80.0–100.0)
Monocytes Absolute: 0.5 K/uL (ref 0.1–1.0)
Monocytes Relative: 7 %
Neutro Abs: 4.2 K/uL (ref 1.7–7.7)
Neutrophils Relative %: 59 %
Platelets: 246 K/uL (ref 150–400)
RBC: 3.3 MIL/uL — ABNORMAL LOW (ref 3.87–5.11)
RDW: 13.1 % (ref 11.5–15.5)
WBC: 7.1 K/uL (ref 4.0–10.5)
nRBC: 0 % (ref 0.0–0.2)

## 2024-11-07 LAB — BASIC METABOLIC PANEL WITH GFR
Anion gap: 10 (ref 5–15)
BUN: 14 mg/dL (ref 8–23)
CO2: 27 mmol/L (ref 22–32)
Calcium: 9.4 mg/dL (ref 8.9–10.3)
Chloride: 98 mmol/L (ref 98–111)
Creatinine, Ser: 0.64 mg/dL (ref 0.44–1.00)
GFR, Estimated: >60 mL/min
Glucose, Bld: 115 mg/dL — ABNORMAL HIGH (ref 70–99)
Potassium: 4.2 mmol/L (ref 3.5–5.1)
Sodium: 135 mmol/L (ref 135–145)

## 2024-11-07 MED ORDER — LINEZOLID 600 MG PO TABS: 600.0000 mg | ORAL_TABLET | Freq: Two times a day (BID) | ORAL | 0 refills | Status: AC

## 2024-11-07 MED ORDER — LINEZOLID 600 MG PO TABS: 600.0000 mg | ORAL_TABLET | Freq: Two times a day (BID) | ORAL | Status: DC

## 2024-11-07 MED ORDER — OXYCODONE HCL 5 MG PO TABS
10.0000 mg | ORAL_TABLET | ORAL | Status: DC | PRN
  Administered 2024-11-07: 10 mg via ORAL
  Filled 2024-11-07: qty 2

## 2024-11-07 NOTE — Plan of Care (Signed)
" °  Problem: Education: Goal: Knowledge of General Education information will improve Description: Including pain rating scale, medication(s)/side effects and non-pharmacologic comfort measures Outcome: Adequate for Discharge   Problem: Health Behavior/Discharge Planning: Goal: Ability to manage health-related needs will improve Outcome: Adequate for Discharge   Problem: Clinical Measurements: Goal: Ability to maintain clinical measurements within normal limits will improve Outcome: Adequate for Discharge Goal: Will remain free from infection Outcome: Adequate for Discharge Goal: Diagnostic test results will improve Outcome: Adequate for Discharge Goal: Respiratory complications will improve Outcome: Adequate for Discharge Goal: Cardiovascular complication will be avoided Outcome: Adequate for Discharge   Problem: Activity: Goal: Risk for activity intolerance will decrease Outcome: Adequate for Discharge   Problem: Nutrition: Goal: Adequate nutrition will be maintained Outcome: Adequate for Discharge   Problem: Coping: Goal: Level of anxiety will decrease Outcome: Adequate for Discharge   Problem: Elimination: Goal: Will not experience complications related to bowel motility Outcome: Adequate for Discharge Goal: Will not experience complications related to urinary retention Outcome: Adequate for Discharge   Problem: Pain Managment: Goal: General experience of comfort will improve and/or be controlled Outcome: Adequate for Discharge   Problem: Safety: Goal: Ability to remain free from injury will improve Outcome: Adequate for Discharge   Problem: Skin Integrity: Goal: Risk for impaired skin integrity will decrease Outcome: Adequate for Discharge   Problem: Education: Goal: Knowledge of the prescribed therapeutic regimen will improve Outcome: Adequate for Discharge   Problem: Bowel/Gastric: Goal: Gastrointestinal status for postoperative course will  improve Outcome: Adequate for Discharge   Problem: Cardiac: Goal: Ability to maintain an adequate cardiac output Outcome: Adequate for Discharge Goal: Will show no evidence of cardiac arrhythmias Outcome: Adequate for Discharge   Problem: Nutritional: Goal: Will attain and maintain optimal nutritional status Outcome: Adequate for Discharge   Problem: Neurological: Goal: Will regain or maintain usual level of consciousness Outcome: Adequate for Discharge   Problem: Clinical Measurements: Goal: Ability to maintain clinical measurements within normal limits Outcome: Adequate for Discharge Goal: Postoperative complications will be avoided or minimized Outcome: Adequate for Discharge   Problem: Respiratory: Goal: Will regain and/or maintain adequate ventilation Outcome: Adequate for Discharge Goal: Respiratory status will improve Outcome: Adequate for Discharge   Problem: Skin Integrity: Goal: Demonstrates signs of wound healing without infection Outcome: Adequate for Discharge   Problem: Urinary Elimination: Goal: Will remain free from infection Outcome: Adequate for Discharge Goal: Ability to achieve and maintain adequate urine output Outcome: Adequate for Discharge   "

## 2024-11-07 NOTE — Plan of Care (Signed)

## 2024-11-07 NOTE — Discharge Summary (Addendum)
 Physician Discharge Summary  Tanya Duncan FMW:[id number] DOB: 1946-07-25 DOA: 11/03/2024  PCP: [name], MD  Admit date: 11/03/2024 Discharge date: 11/07/2024  Time spent: 42 minutes  Recommendations for Outpatient Follow-up:  Complete 4 weeks antibiotics linezolid  600 twice daily and then another course of cefadroxil or amoxicillin for group A strep growing on finger septic arthritis Labs in 1 week  Discharge Diagnoses:  MAIN problem for hospitalization   Septic arthritis right index finger  Please see below for itemized issues addressed in HOpsital- refer to other progress notes for clarity if needed  Discharge Condition: Improved  Diet recommendation: Heart healthy  Filed Weights   11/05/24 0500 11/05/24 1407 11/06/24 0500  Weight: 59.6 kg 59.6 kg 59.5 kg    History of present illness:  79 year old white female high functioning Previous ORIF periprosthetic fracture 05/15/2023 with plate removal (Dr. Dozier) Remote history of GI bleed and peritonitis from ruptured gastric ulcer remotely several years ago Depression Fibromyalgia Rheumatoid arthritis follows with [facility] Lauraine Burkes Spondylolisthesis on pain meds oxycodone  Cymbalta Prediabetes   Developed right index finger pain middle of December hide PCP given doxycycline Did not improve Came to hospital second distal interphalangeal joint increased joint space periosteal reaction and thought to have infection cellulitis of the right index finger  12/30 underwent right index finger DIP joint incision and drainage  ID following      Assessment  & Plan :      Right index finger infection status post I&D 12/30-growing group A strep Recent periprosthetic hip fracture with removal of plate 12/08/7973 Dr. Dozier Pain seems moderately controlled-secondary to digital block.  Operatively, Tylenol  3 Story 650 for pain-resumed home medications including high-dose gabapentin  900 twice daily and home medication of  Percocet 7.5 ID guiding antibiotics-seems to need linezolid  p.o. every 12 and then follow-up in the outpatient setting and on follow-up will be prescribed possibly  cefadroxil or Amoxil as per ID  Stable for discharge   Depression-continue Lexapro  20 mg Ativan  1 mg every 8 Pamelor  75 at bedtime Watch for polypharmacy in the outpatient setting and likely cut back dosing   Previous history of GI bleed Stable at this time   Rheumatoid arthritis with significant joint deformities Not on any DMA RD    Discharge Exam: Vitals:   11/07/24 0850 11/07/24 1444  BP: 125/77 (!) 123/105  Pulse: 84 86  Resp:    Temp: 97.8 F (36.6 C)   SpO2: 99% 98%    Subj on day of d/c   Was in pain earlier today but currently feels better No fever no chills  General Exam on discharge  EOMI NCAT no focal deficit no icterus no pallor Right index finger is wrapped in reinforced dressing CTAB no added sound Abdomen soft no rebound no guarding No lower extremity edema  Discharge Instructions Discharge Instructions     Discharge instructions   Complete by: As directed    These make sure that you complete the linezolid  which is the antibiotic for your finger-I would continue that for period of a total of 2 weeks and then the go to [facility] and she will instruct u what to do about the meds Wound care needs to be organized for you please keep the area dry or as per the hand surgeon who did actual surgery You will need labs in about a week because some of your kidney function may be affected by some of your medications Please if you feel confused or little loopy cut  back your gabapentin  as you are on high doses of that in addition to several other medications which have a potential to make you feel confused If you have high-grade fevers chills nausea vomiting please   Increase activity slowly   Complete by: As directed    Leave dressing on - Keep it clean, dry, and intact until clinic visit    Complete by: As directed          Discharge Instructions      [facility] Instructions Hand Surgery  Wound Care: Keep your hand elevated above the level of your heart.  Do not allow it to dangle by your side.  Keep the dressing dry and do not remove it unless your doctor advises you to do so.  He will usually change it at the time of your post-op visit.  Moving your fingers is advised to stimulate circulation but will depend on the site of your surgery.  If you have a splint applied, your doctor will advise you regarding movement.  Activity: Do not drive or operate machinery today.  Rest today and then you may return to your normal activity and work as indicated by your physician.  Diet:  Drink liquids today or eat a light diet.  You may resume a regular diet tomorrow.    General expectations: Pain for two to three days. Fingers may become slightly swollen.  Call your doctor if any of the following occur: Severe pain not relieved by pain medication. Elevated temperature. Dressing soaked with blood. Inability to move fingers. White or bluish color to fingers.       Allergies as of 11/07/2024       Reactions   Atorvastatin Diarrhea   Bupropion Nausea And Vomiting   Duloxetine Hcl Nausea And Vomiting   Ibuprofen Other (See Comments)   Has had GI bleeds x2 in past   Sage [salvia Officinalis] Palpitations, Other (See Comments)   Heart races, flu-like symptoms    Sulfa Antibiotics Nausea And Vomiting   Avelox [moxifloxacin Hcl In Nacl] Palpitations   Indocin [indomethacin] Other (See Comments)   Dizziness        Medication List     STOP taking these medications    doxycycline 100 MG capsule Commonly known as: VIBRAMYCIN   zolpidem  12.5 MG CR tablet Commonly known as: AMBIEN  CR       TAKE these medications    acetaminophen  500 MG tablet Commonly known as: TYLENOL  Take 500-1,000 mg by mouth every 6 (six) hours as needed for moderate pain (pain score  4-6).   amLODipine 5 MG tablet Commonly known as: NORVASC Take 5 mg by mouth daily.   b complex vitamins tablet Take 1 tablet by mouth daily.   calcium carbonate 750 MG chewable tablet Commonly known as: TUMS EX Chew 1-3 tablets by mouth as needed for heartburn.   CALCIUM PO Take 2 each by mouth in the morning.   ergocalciferol 1.25 MG (50000 UT) capsule Commonly known as: VITAMIN D2 Take 50,000 Units by mouth once a week.   escitalopram  20 MG tablet Commonly known as: LEXAPRO  Take 20 mg by mouth in the morning.   famotidine  40 MG tablet Commonly known as: PEPCID  TAKE 1 TABLET(40 MG) BY MOUTH TWICE DAILY   gabapentin  300 MG capsule Commonly known as: NEURONTIN  Take 900 mg by mouth 2 (two) times daily.   lidocaine  5 % Commonly known as: LIDODERM  Place 1 patch onto the skin daily as needed (for pain).  linezolid  600 MG tablet Commonly known as: ZYVOX  Take 1 tablet (600 mg total) by mouth 2 (two) times daily for 23 days.   LORazepam  1 MG tablet Commonly known as: ATIVAN  Take 1 mg by mouth every 8 (eight) hours as needed for anxiety.   losartan 25 MG tablet Commonly known as: COZAAR Take 25 mg by mouth. What changed: Another medication with the same name was removed. Continue taking this medication, and follow the directions you see here.   multivitamin with minerals Tabs tablet Take 1 tablet by mouth daily.   nortriptyline  25 MG capsule Commonly known as: PAMELOR  Take 75 mg by mouth at bedtime.   oxyCODONE -acetaminophen  7.5-325 MG tablet Commonly known as: PERCOCET Take 1 tablet by mouth.   pravastatin  80 MG tablet Commonly known as: PRAVACHOL  Take 80 mg by mouth in the morning.   tiZANidine  4 MG tablet Commonly known as: ZANAFLEX  Take 4 mg by mouth 2 (two) times daily.   traMADol 50 MG tablet Commonly known as: ULTRAM Take 50-100 mg by mouth every 6 (six) hours as needed.   vitamin C 1000 MG tablet Take 1,000 mg by mouth in the morning and at  bedtime.               Discharge Care Instructions  (From admission, onward)           Start     Ordered   11/07/24 0000  Leave dressing on - Keep it clean, dry, and intact until clinic visit        11/07/24 1610           Allergies[1]  Follow-up Information     Murrell Drivers, MD. Call.   Specialty: Orthopedic Surgery Contact information: [address] [place] [address] [phone]                  The results of significant diagnostics from this hospitalization (including imaging, microbiology, ancillary and laboratory) are listed below for reference.    Significant Diagnostic Studies: DG Hand Complete Right Result Date: 11/02/2024 EXAM: 3 OR MORE VIEW(S) XRAY OF THE RIGHT HAND 11/02/2024 05:52:12 PM COMPARISON: None available. CLINICAL HISTORY: Wound to right index finger with concern for osteomyelitis. FINDINGS: BONES AND JOINTS: ORIF of proximal phalanx of third digit in place. Severe degenerative changes of the second through fourth distal interphalangeal joints with severe joint space narrowing and osteophyte formation. Specifically, at the 2nd distal interphalangeal joint, there are degenerative changes; however, there is increased joint space at this level, and a septic joint is not excluded. Moderate degenerative changes of the third digit. There is some mild periosteal reaction along the distal aspect of the 2nd middle phalanx, and osteomyelitis is not excluded. No acute fracture. No malalignment. SOFT TISSUES: Marked soft tissue swelling of distal aspect of index finger. IMPRESSION: 1. Marked soft tissue swelling of the distal aspect of the index finger. 2. Degenerative changes of the 2nd distal interphalangeal joint with increased joint space and mild periosteal reaction along the distal aspect of the 2nd middle phalanx; septic joint and osteomyelitis are not excluded. 3. Severe degenerative changes of the second through fourth distal  interphalangeal joints with severe joint space narrowing and osteophyte formation; moderate degenerative changes of the third digit. 4. ORIF of the proximal phalanx of the third digit in place. Electronically signed by: Greig Pique MD 11/02/2024 07:09 PM EST RP Workstation: [id number]   DG MYELOGRAPHY LUMBAR INJ LUMBOSACRAL Result Date: 10/18/2024 EXAM: LUMBAR MYELOGRAM UNDER FLUOROSCOPY . CT  OF THE LUMBAR SPINE WITH INTRATHECAL CONTRAST 10/18/2024 11:27:01 AM TECHNIQUE: An appropriate skin area is determined under fluoroscopy, prepped with Betadine , draped in usual sterile fashion, and infiltrated locally with 1% lidocaine . Under fluoroscopic guidance, a 22 gauge needle was advanced into the thecal sac at L5-S1 from a right parasagittal approach. Clear cerebrospinal fluid (CSF) returned. 18 ml Isovue  255m was administered intrathecally for lumbar myelography. CT of the lumbar spine was performed after the administration of intrathecal contrast with multiplanar reconstructed images provided for interpretation. Dose modulation, iterative reconstruction, and/or weight based adjustment of the mA/kV was utilized to reduce the radiation dose to as low as reasonably achievable. Incidental adrenal and/or renal findings do not require follow up imaging. COMPARISON: MR 10/18/2023 and previous. CLINICAL HISTORY: Spondylolisthesis of lumbar region. FINDINGS: BONES AND ALIGNMENT: 5 non-rib bearing lumbar segments assigned L1-L5. SABRA There is a mild S-shaped scoliosis of the thoracolumbar spine, apex to the right at T11, to the left at L4. The vertebral body heights are maintained. No osseous destructive lesion is seen. Bilateral hip arthroplasty hardware partially visualized. SOFT TISSUES: No paraspinal mass or fluid collection. VASCULATURE: Moderate calcified aortic plaque without aneurysm. T11-T12: Asymmetric narrowing of the interspace left more severe than right, with small adjacent Schmorl nodes and discogenic  sclerosis. Asymmetric facet DJD left worse than right resulting in mild spinal stenosis, and left foraminal encroachment. T12-L1: Mild circumferential disc bulge without cord distortion or significant spinal stenosis. Asymmetric facet DJD left worse than right with mild encroachment on the left foramen. L1-L2: Advanced narrowing of the interspace with vacuum phenomenon, circumferential endplate spurring, small Schmorl nodes, and subchondral sclerosis. Several mm right lateral subluxation of L1 on L2, and 4 mm retrolisthesis. No dynamic instability on standing lateral flexion/extension. Asymmetric facet DJD right worse than left with some thickening of the ligamentum flavum, contributing to mild spinal stenosis. No cord distortion. Conus terminates behind L2. Foramina patent. L2-L3: Solid appearing instrumented PLIF, and posterior decompression . SABRA Central canal is capacious. Foramina patent. L3-L4: Continuation of solid appearing PLIF, hardware intact without surrounding lucency. Posterior decompression. Central canal capacious. Foramina patent. L4-L5: Mild grade 1 anterolisthesis, without dynamic instability on standing lateral flexion/extension. Asymmetric narrowing of the interspace right worse than left with moderate vacuum phenomenon. Mild circumferential disc bulge. Bilateral moderate facet DJD and thickening of ligamentum flavum contributing to mild spinal stenosis, and foraminal encroachment right greater than left. Changes of previous L5 cement augmentation without evident complication. L5-S1: Mild vacuum phenomenon centrally in the interspace. Mild/moderate bilateral facet DJD. Minimal disc bulge. No spinal stenosis. Mild right foraminal encroachment. Previous left sacral alar fracture, post left sacroplasty. IMPRESSION: 1. Mild multilevel lumbar spondylosis with areas of mild spinal stenosis and foraminal encroachment, greatest at L1-2 and L4-L5. 2. Status post L2-4 posterior lumbar interbody fusion with  decompression, with capacious canal and patent foramina at these levels. 3. Mild grade 1 anterolisthesis at L4-L5 without dynamic instability. Electronically signed by: Katheleen Faes MD 10/18/2024 02:06 PM EST RP Workstation: [id number]   CT LUMBAR SPINE W CONTRAST Result Date: 10/18/2024 EXAM: LUMBAR MYELOGRAM UNDER FLUOROSCOPY . CT OF THE LUMBAR SPINE WITH INTRATHECAL CONTRAST 10/18/2024 11:27:01 AM TECHNIQUE: An appropriate skin area is determined under fluoroscopy, prepped with Betadine , draped in usual sterile fashion, and infiltrated locally with 1% lidocaine . Under fluoroscopic guidance, a 22 gauge needle was advanced into the thecal sac at L5-S1 from a right parasagittal approach. Clear cerebrospinal fluid (CSF) returned. 18 ml Isovue  2109m was administered intrathecally for lumbar myelography. CT  of the lumbar spine was performed after the administration of intrathecal contrast with multiplanar reconstructed images provided for interpretation. Dose modulation, iterative reconstruction, and/or weight based adjustment of the mA/kV was utilized to reduce the radiation dose to as low as reasonably achievable. Incidental adrenal and/or renal findings do not require follow up imaging. COMPARISON: MR 10/18/2023 and previous. CLINICAL HISTORY: Spondylolisthesis of lumbar region. FINDINGS: BONES AND ALIGNMENT: 5 non-rib bearing lumbar segments assigned L1-L5. SABRA There is a mild S-shaped scoliosis of the thoracolumbar spine, apex to the right at T11, to the left at L4. The vertebral body heights are maintained. No osseous destructive lesion is seen. Bilateral hip arthroplasty hardware partially visualized. SOFT TISSUES: No paraspinal mass or fluid collection. VASCULATURE: Moderate calcified aortic plaque without aneurysm. T11-T12: Asymmetric narrowing of the interspace left more severe than right, with small adjacent Schmorl nodes and discogenic sclerosis. Asymmetric facet DJD left worse than right resulting in  mild spinal stenosis, and left foraminal encroachment. T12-L1: Mild circumferential disc bulge without cord distortion or significant spinal stenosis. Asymmetric facet DJD left worse than right with mild encroachment on the left foramen. L1-L2: Advanced narrowing of the interspace with vacuum phenomenon, circumferential endplate spurring, small Schmorl nodes, and subchondral sclerosis. Several mm right lateral subluxation of L1 on L2, and 4 mm retrolisthesis. No dynamic instability on standing lateral flexion/extension. Asymmetric facet DJD right worse than left with some thickening of the ligamentum flavum, contributing to mild spinal stenosis. No cord distortion. Conus terminates behind L2. Foramina patent. L2-L3: Solid appearing instrumented PLIF, and posterior decompression . SABRA Central canal is capacious. Foramina patent. L3-L4: Continuation of solid appearing PLIF, hardware intact without surrounding lucency. Posterior decompression. Central canal capacious. Foramina patent. L4-L5: Mild grade 1 anterolisthesis, without dynamic instability on standing lateral flexion/extension. Asymmetric narrowing of the interspace right worse than left with moderate vacuum phenomenon. Mild circumferential disc bulge. Bilateral moderate facet DJD and thickening of ligamentum flavum contributing to mild spinal stenosis, and foraminal encroachment right greater than left. Changes of previous L5 cement augmentation without evident complication. L5-S1: Mild vacuum phenomenon centrally in the interspace. Mild/moderate bilateral facet DJD. Minimal disc bulge. No spinal stenosis. Mild right foraminal encroachment. Previous left sacral alar fracture, post left sacroplasty. IMPRESSION: 1. Mild multilevel lumbar spondylosis with areas of mild spinal stenosis and foraminal encroachment, greatest at L1-2 and L4-L5. 2. Status post L2-4 posterior lumbar interbody fusion with decompression, with capacious canal and patent foramina at these  levels. 3. Mild grade 1 anterolisthesis at L4-L5 without dynamic instability. Electronically signed by: Katheleen Faes MD 10/18/2024 02:06 PM EST RP Workstation: [id number]    Microbiology: Recent Results (from the past 240 hours)  Blood culture (routine x 2)     Status: Abnormal   Collection Time: 11/02/24  5:40 PM   Specimen: BLOOD LEFT FOREARM  Result Value Ref Range Status   Specimen Description BLOOD LEFT FOREARM  Final   Special Requests   Final    BOTTLES DRAWN AEROBIC AND ANAEROBIC Blood Culture adequate volume   Culture  Setup Time   Final    GRAM POSITIVE COCCI IN BOTH AEROBIC AND ANAEROBIC BOTTLES CRITICAL RESULT CALLED TO, READ BACK BY AND VERIFIED WITH: PHARMD Warren NOVAK on [id number] @1245  by SM Performed at [facility], [address] [place], [place] [address]    Culture GROUP A STREP (S.PYOGENES) ISOLATED (A)  Final   Report Status 11/05/2024 FINAL  Final   Organism ID, Bacteria GROUP A STREP (S.PYOGENES) ISOLATED  Final  Susceptibility   Group a strep (s.pyogenes) isolated - MIC*    PENICILLIN  <=0.06 SENSITIVE Sensitive     CEFTRIAXONE <=0.12 SENSITIVE Sensitive     ERYTHROMYCIN <=0.12 SENSITIVE Sensitive     LEVOFLOXACIN 0.5 SENSITIVE Sensitive     VANCOMYCIN 0.5 SENSITIVE Sensitive     * GROUP A STREP (S.PYOGENES) ISOLATED  Blood Culture ID Panel (Reflexed)     Status: Abnormal   Collection Time: 11/02/24  5:40 PM  Result Value Ref Range Status   Enterococcus faecalis NOT DETECTED NOT DETECTED Final   Enterococcus Faecium NOT DETECTED NOT DETECTED Final   Listeria monocytogenes NOT DETECTED NOT DETECTED Final   Staphylococcus species NOT DETECTED NOT DETECTED Final   Staphylococcus aureus (BCID) NOT DETECTED NOT DETECTED Final   Staphylococcus epidermidis NOT DETECTED NOT DETECTED Final   Staphylococcus lugdunensis NOT DETECTED NOT DETECTED Final   Streptococcus species DETECTED (A) NOT DETECTED Final    Comment: CRITICAL RESULT CALLED TO, READ BACK BY  AND VERIFIED WITH: PHARMD Jamie B on [id number] @1245  by SM    Streptococcus agalactiae NOT DETECTED NOT DETECTED Final   Streptococcus pneumoniae NOT DETECTED NOT DETECTED Final   Streptococcus pyogenes DETECTED (A) NOT DETECTED Final    Comment: CRITICAL RESULT CALLED TO, READ BACK BY AND VERIFIED WITH: PHARMD Jamie B on [id number] @1245  by SM    A.calcoaceticus-baumannii NOT DETECTED NOT DETECTED Final   Bacteroides fragilis NOT DETECTED NOT DETECTED Final   Enterobacterales NOT DETECTED NOT DETECTED Final   Enterobacter cloacae complex NOT DETECTED NOT DETECTED Final   Escherichia coli NOT DETECTED NOT DETECTED Final   Klebsiella aerogenes NOT DETECTED NOT DETECTED Final   Klebsiella oxytoca NOT DETECTED NOT DETECTED Final   Klebsiella pneumoniae NOT DETECTED NOT DETECTED Final   Proteus species NOT DETECTED NOT DETECTED Final   Salmonella species NOT DETECTED NOT DETECTED Final   Serratia marcescens NOT DETECTED NOT DETECTED Final   Haemophilus influenzae NOT DETECTED NOT DETECTED Final   Neisseria meningitidis NOT DETECTED NOT DETECTED Final   Pseudomonas aeruginosa NOT DETECTED NOT DETECTED Final   Stenotrophomonas maltophilia NOT DETECTED NOT DETECTED Final   Candida albicans NOT DETECTED NOT DETECTED Final   Candida auris NOT DETECTED NOT DETECTED Final   Candida glabrata NOT DETECTED NOT DETECTED Final   Candida krusei NOT DETECTED NOT DETECTED Final   Candida parapsilosis NOT DETECTED NOT DETECTED Final   Candida tropicalis NOT DETECTED NOT DETECTED Final   Cryptococcus neoformans/gattii NOT DETECTED NOT DETECTED Final    Comment: Performed at [facility], [address], [place], [place] [address]  Culture, blood (Routine X 2) w Reflex to ID Panel     Status: None (Preliminary result)   Collection Time: 11/05/24 12:48 PM   Specimen: BLOOD  Result Value Ref Range Status   Specimen Description BLOOD SITE NOT SPECIFIED  Final   Special Requests   Final    BOTTLES DRAWN  AEROBIC AND ANAEROBIC Blood Culture adequate volume   Culture   Final    NO GROWTH 2 DAYS Performed at [facility], [address], [place], [place] [address]    Report Status PENDING  Incomplete  Culture, blood (Routine X 2) w Reflex to ID Panel     Status: None (Preliminary result)   Collection Time: 11/05/24 12:55 PM   Specimen: BLOOD  Result Value Ref Range Status   Specimen Description BLOOD SITE NOT SPECIFIED  Final   Special Requests   Final  BOTTLES DRAWN AEROBIC ONLY Blood Culture results may not be optimal due to an inadequate volume of blood received in culture bottles   Culture   Final    NO GROWTH 2 DAYS Performed at [facility], [address], [place], [place] [address]    Report Status PENDING  Incomplete  Aerobic/Anaerobic Culture w Gram Stain (surgical/deep wound)     Status: None (Preliminary result)   Collection Time: 11/05/24  4:21 PM   Specimen: Soft Tissue, Other  Result Value Ref Range Status   Specimen Description WOUND RIGHT FINGER  Final   Special Requests RT INDEX PT ON ANCEF   Final   Gram Stain   Final    FEW WBC PRESENT, PREDOMINANTLY PMN RARE GRAM POSITIVE COCCI Performed at [facility], [address] [place], [place] [address]    Culture   Final    CULTURE REINCUBATED FOR BETTER GROWTH NO ANAEROBES ISOLATED; CULTURE IN PROGRESS FOR 5 DAYS    Report Status PENDING  Incomplete     Labs: Basic Metabolic Panel: Recent Labs  Lab 11/03/24 1637 11/04/24 2107 11/05/24 0315 11/06/24 0828 11/07/24 0306  NA 134* 134* 132* 131* 135  K 3.8 3.8 3.8 4.5 4.2  CL 97* 96* 96* 95* 98  CO2 25 29 27 26 27   GLUCOSE 123* 131* 119* 194* 115*  BUN 8 9 8 13 14   CREATININE 0.57 1.13* 0.77 0.64 0.64  CALCIUM 9.8 8.8* 9.1 9.5 9.4   Liver Function Tests: Recent Labs  Lab 11/04/24 2107 11/05/24 0315  AST 17 15  ALT 10 9  ALKPHOS 72 68  BILITOT 0.4 0.4  PROT 5.8* 5.6*  ALBUMIN 3.6 3.5   No results for input(s): LIPASE,  AMYLASE in the last 168 hours. No results for input(s): AMMONIA in the last 168 hours. CBC: Recent Labs  Lab 11/02/24 1720 11/03/24 1637 11/04/24 2107 11/05/24 0315 11/06/24 0828 11/07/24 0306  WBC 13.6* 15.5* 8.1 8.2 6.6 7.1  NEUTROABS 10.4* 13.5* 5.9  --  5.1 4.2  HGB 10.9* 10.7* 10.1* 10.0* 10.9* 9.9*  HCT 33.1* 32.6* 31.5* 30.6* 33.1* 29.5*  MCV 90.4 88.3 92.1 90.8 90.7 89.4  PLT 260 240 210 207 191 246   Cardiac Enzymes: No results for input(s): CKTOTAL, CKMB, CKMBINDEX, TROPONINI in the last 168 hours. BNP: BNP (last 3 results) No results for input(s): BNP in the last 8760 hours.  ProBNP (last 3 results) No results for input(s): PROBNP in the last 8760 hours.  CBG: No results for input(s): GLUCAP in the last 168 hours.  Signed:  Colen Grimes MD   [facility] 11/07/2024, 4:07 PM      [1]  Allergies Allergen Reactions   Atorvastatin Diarrhea   Bupropion Nausea And Vomiting   Duloxetine Hcl Nausea And Vomiting   Ibuprofen Other (See Comments)    Has had GI bleeds x2 in past   Sage [Salvia Officinalis] Palpitations and Other (See Comments)    Heart races, flu-like symptoms    Sulfa Antibiotics Nausea And Vomiting   Avelox [Moxifloxacin Hcl In Nacl] Palpitations   Indocin [Indomethacin] Other (See Comments)    Dizziness

## 2024-11-10 LAB — CULTURE, BLOOD (ROUTINE X 2)
Culture: NO GROWTH
Culture: NO GROWTH
Special Requests: ADEQUATE

## 2024-11-10 LAB — AEROBIC/ANAEROBIC CULTURE W GRAM STAIN (SURGICAL/DEEP WOUND)

## 2024-11-13 ENCOUNTER — Ambulatory Visit

## 2024-11-14 ENCOUNTER — Ambulatory Visit: Admitting: Obstetrics

## 2024-11-14 NOTE — Progress Notes (Unsigned)
 " New Patient Evaluation and Consultation  Referring Provider: Charolet Yvonna SQUIBB, PA-C PCP: [name], MD Date of Service: 11/14/2024  SUBJECTIVE Chief Complaint: No chief complaint on file.  History of Present Illness: Tanya Duncan is a 79 y.o. White or Caucasian female seen in consultation at the request of PA Charolet for evaluation of urinary frequency/urgency.    Urinary frequency and urgency with leakage upon standing or getting out of bed, worsened in the past few months  Failed oxybutynin Pending peripheral nerve stimulator for spondylolisthesis on pain oxycodone  and cymbalta *** S/p ORIF periprosthetic fracture 05/15/2023 with plate removal (Dr. Dozier)  ED admission 11/03/24-11/07/24 for septic arthritis right index finger s/p I&D of right index finger 11/05/24. Continued Linezolid  and Cefadroxil/amoxicillin  per ID  ***Review of records significant for: ***Ruptured gastric ulcer, fibromyalgia, RA  Urinary Symptoms: {urine leakage?:24754} Leaks *** time(s) per {days/wks/mos/yrs:310907}.  Pad use: {NUMBERS 1-10:18281} {pad option:24752} per day.   Patient {ACTION; IS/IS WNU:78978602} bothered by UI symptoms.  Day time voids ***.  Nocturia: *** times per night to void with insomnia Voiding dysfunction:  {empties:24755} bladder well.  Patient {DOES NOT does:27190::does not} use a catheter to empty bladder.  When urinating, patient feels {urine symptoms:24756} Drinks: *** per day  UTIs: {NUMBERS 1-10:18281} UTI's in the last year.   {ACTIONS;DENIES/REPORTS:21021675::Denies} history of {urologic concerns:24757} Susceptibility data from last 90 days. Collected Specimen Info Organism CEFTRIAXONE Erythromycin LEVOFLOXACIN PENICILLIN  TELAVANCIN  11/02/24 Blood from [facility]LOOD [facility] a strep (s.pyogenes) isolated  S  S  S  S  S    Pelvic Organ Prolapse Symptoms:                  Patient {denies/ admits to:24761} a feeling of a bulge the vaginal area. It has been  present for {NUMBER 1-10:22536} {days/wks/mos/yrs:310907}.  Patient {denies/ admits to:24761} seeing a bulge.  This bulge {ACTION; IS/IS WNU:78978602} bothersome.  Bowel Symptom: Bowel movements: *** time(s) per {Time; day/week/month:13537} Stool consistency: {stool consistency:24758} Straining: {yes/no:19897}.  Splinting: {yes/no:19897}.  Incomplete evacuation: {yes/no:19897}.  Patient {denies/ admits to:24761} accidental bowel leakage / fecal incontinence  Occurs: *** time(s) per {Time; day/week/month:13537}  Consistency with leakage: {stool consistency:24758} Bowel regimen: {bowel regimen:24759} Last colonoscopy: Date ***, Results *** [facility] Colonoscopy          Upcoming     Colonoscopy (Every 5 Years) Next due on 10/15/2029    10/15/2024  COLONOSCOPY  Only the first 1 history entries have been loaded, but more history exists.               Sexual Function Sexually active: {yes/no:19897}.  Sexual orientation: {Sexual Orientation:[phone]} Pain with sex: {pain with sex:24762}  Pelvic Pain {denies/ admits to:24761} pelvic pain Location: *** Pain occurs: *** Prior pain treatment: *** Improved by: *** Worsened by: ***   Past Medical History:  Past Medical History:  Diagnosis Date   Anxiety    Arthritis    Blood transfusion without reported diagnosis 05/2019   with hip surgery   Complication of anesthesia    Fibromyalgia    Fractured pelvis (HCC) 09/15/2020   right   GERD (gastroesophageal reflux disease)    GI bleed 8111,8009   x2   Hyperlipidemia    Palpitations    Pneumonia 2005   PONV (postoperative nausea and vomiting)    Pre-diabetes    Stomach ulcer 1990     Past Surgical History:   Past Surgical History:  Procedure Laterality Date   ABDOMINAL HYSTERECTOMY  FRACTURE SURGERY Right 1998   ankle   INCISION AND DRAINAGE OF WOUND Right 11/05/2024   Procedure: IRRIGATION AND DEBRIDEMENT RIGHT INDEX FINGER WOUND;  Surgeon: Murrell Drivers, MD;   Location: [facility] OR;  Service: Orthopedics;  Laterality: Right;   JOINT REPLACEMENT     LUMBAR FUSION  2015   OPEN REDUCTION INTERNAL FIXATION (ORIF) PROXIMAL PHALANX Right 09/21/2020   Procedure: OPEN TREATMENT OF RIGHT LONG FINGER PROXIMAL PHALANX FRACTURE;  Surgeon: Sebastian Lenis, MD;  Location: [facility] OR;  Service: Orthopedics;  Laterality: Right;  LENGTH OF SURGERY: 75 MIN   ORIF HUMERUS FRACTURE Left    ORIF HUMERUS FRACTURE Left 05/15/2023   Procedure: OPEN REDUCTION INTERNAL FIXATION (ORIF) PERIPROSTHETIC HUMERUS FRACTURE WITH PLATE REMOVAL;  Surgeon: Dozier Soulier, MD;  Location: [facility] ORS;  Service: Orthopedics;  Laterality: Left;   ORIF TIBIA FRACTURE Right 1998   with bone graft   PYLOROPLASTY     REVERSE SHOULDER ARTHROPLASTY Left 02/04/2021   Procedure: REVERSE SHOULDER ARTHROPLASTY WITH HARDWARE REMOVAL;  Surgeon: Dozier Soulier, MD;  Location: [facility] ORS;  Service: Orthopedics;  Laterality: Left;   TOTAL HIP ARTHROPLASTY  2011   TOTAL HIP ARTHROPLASTY Right 05/14/2019   Procedure: Right Anterior Hip Arthroplasty;  Surgeon: Sheril Coy, MD;  Location: [facility] ORS;  Service: Orthopedics;  Laterality: Right;   TOTAL SHOULDER REPLACEMENT  2014     Past OB/GYN History: OB History  No obstetric history on file.    Vaginal deliveries: ***,  Forceps/ Vacuum deliveries: ***, Cesarean section: *** Menopausal: {menopausal:24763} Contraception: ***. Last pap smear was ***.  Any history of abnormal pap smears: {yes/no:19897}. No results found for: DIAGPAP, HPVHIGH, ADEQPAP  Medications: Patient has a current medication list which includes the following prescription(s): acetaminophen , amlodipine, vitamin c, b complex vitamins, calcium  carbonate, calcium , ergocalciferol, escitalopram , famotidine , gabapentin , lidocaine , linezolid , lorazepam , losartan, multivitamin with minerals, nortriptyline , pravastatin , tizanidine , and tramadol, and the following Facility-Administered Medications: sodium  chloride.   Allergies: Patient is allergic to atorvastatin, bupropion, duloxetine hcl, ibuprofen, sage [salvia officinalis], sulfa antibiotics, avelox [moxifloxacin hcl in nacl], and indocin [indomethacin].   Social History: Social History[1]  Relationship status: {relationship status:24764} Patient lives with ***.   Patient {ACTION; IS/IS WNU:78978602} employed ***. Regular exercise: {Yes/No:304960894} History of abuse: {Yes/No:304960894}  Family History:   Family History  Problem Relation Age of Onset   Colon cancer Father 50   Stomach cancer Paternal Uncle    Esophageal cancer Neg Hx    Rectal cancer Neg Hx      Review of Systems: ROS   OBJECTIVE Physical Exam: There were no vitals filed for this visit.  Physical Exam   GU / Detailed Urogynecologic Evaluation:  Pelvic Exam: Normal external female genitalia; Bartholin's and Skene's glands normal in appearance; urethral meatus normal in appearance, no urethral masses or discharge.   CST: {gen negative/positive:315881}  Reflexes: bulbocavernosis {DESC; PRESENT/NOT PRESENT:21021351}, anocutaneous {DESC; PRESENT/NOT PRESENT:21021351} ***bilaterally.  Speculum exam reveals normal vaginal mucosa {With/Without:20273} atrophy. Cervix {exam; gyn cervix:30847}. Uterus {exam; pelvic uterus:30849}. Adnexa {exam; adnexa:12223}.    s/p hysterectomy: Speculum exam reveals normal vaginal mucosa {With/Without:20273}  atrophy and normal vaginal cuff.  Adnexa {exam; adnexa:12223}.    With apex supported, anterior compartment defect was {reduced:24765}  Pelvic floor strength {Roman # I-V:19040}/V, puborectalis {Roman # I-V:19040}/V external anal sphincter {Roman # I-V:19040}/V  Pelvic floor musculature: Right levator {Tender/Non-tender:20250}, Right obturator {Tender/Non-tender:20250}, Left levator {Tender/Non-tender:20250}, Left obturator {Tender/Non-tender:20250}  POP-Q:   POP-Q  Aa                                                Ba                                                 C                                                Gh                                               Pb                                               tvl                                                Ap                                               Bp                                                 D      Rectal Exam:  Normal sphincter tone, {rectocele:24766} distal rectocele, enterocoele {DESC; PRESENT/NOT PRESENT:21021351}, no rectal masses, {sign of:24767} dyssynergia when asking the patient to bear down.  Post-Void Residual (PVR) by Bladder Scan: In order to evaluate bladder emptying, we discussed obtaining a postvoid residual and patient agreed to this procedure.  Procedure: The ultrasound unit was placed on the patient's abdomen in the suprapubic region after the patient had voided.      Laboratory Results: Lab Results  Component Value Date   BILIRUBINUR NEGATIVE 01/29/2021   PROTEINUR NEGATIVE 01/29/2021   LEUKOCYTESUR NEGATIVE 01/29/2021    Lab Results  Component Value Date   CREATININE 0.64 11/07/2024   CREATININE 0.64 11/06/2024   CREATININE 0.77 11/05/2024    No results found for: HGBA1C  Lab Results  Component Value Date   HGB 9.9 (L) 11/07/2024     ASSESSMENT AND PLAN Ms. Durnin is a 79 y.o. with: No diagnosis found.  There are no diagnoses linked to this encounter.   Lianne ONEIDA Gillis, MD        [1]  Social History Tobacco Use   Smoking status: Never   Smokeless tobacco: Never  Vaping Use   Vaping status: Never Used  Substance Use  Topics   Alcohol use: Not Currently    Comment: rare   Drug use: Never   "

## 2024-11-21 ENCOUNTER — Telehealth: Payer: Self-pay

## 2024-11-21 ENCOUNTER — Ambulatory Visit: Payer: Self-pay | Admitting: Infectious Diseases

## 2024-11-21 NOTE — Telephone Encounter (Signed)
 Called patient regarding today's missed appointment, no answer. Left HIPAA compliant voicemail requesting callback.   Shagun Wordell, BSN, RN

## 2024-11-22 ENCOUNTER — Ambulatory Visit: Admitting: Infectious Diseases

## 2024-11-22 ENCOUNTER — Other Ambulatory Visit: Payer: Self-pay

## 2024-11-22 ENCOUNTER — Encounter: Payer: Self-pay | Admitting: Infectious Diseases

## 2024-11-22 VITALS — BP 130/89 | HR 86 | Temp 97.5°F | Ht 62.0 in | Wt 135.0 lb

## 2024-11-22 DIAGNOSIS — L03119 Cellulitis of unspecified part of limb: Secondary | ICD-10-CM

## 2024-11-22 DIAGNOSIS — M25551 Pain in right hip: Secondary | ICD-10-CM

## 2024-11-22 DIAGNOSIS — Z96641 Presence of right artificial hip joint: Secondary | ICD-10-CM

## 2024-11-22 DIAGNOSIS — B954 Other streptococcus as the cause of diseases classified elsewhere: Secondary | ICD-10-CM

## 2024-11-22 DIAGNOSIS — L03011 Cellulitis of right finger: Secondary | ICD-10-CM

## 2024-11-22 DIAGNOSIS — R7881 Bacteremia: Secondary | ICD-10-CM | POA: Diagnosis not present

## 2024-11-22 MED ORDER — AMOXICILLIN 500 MG PO CAPS: 1000.0000 mg | ORAL_CAPSULE | Freq: Three times a day (TID) | ORAL | 0 refills | Status: DC

## 2024-11-22 NOTE — Progress Notes (Addendum)
 "     Patient: Tanya Duncan  DOB: 1946/04/05 MRN: [id number] PCP: [name], MD   Reason for Visit: hospital discharge follow up   Chief Complaint  Patient presents with   Hospitalization Follow-up      Subjective   Subjective:  Discussed the use of AI scribe software for clinical note transcription with the patient, who gave verbal consent to proceed.  History of Present Illness   Tanya Duncan is a 79 year old female with rheumatoid arthritis who presents for follow-up of an infected right long finger due to group A streptococcus.  She is here for follow-up after hospitalization for an infected right long finger caused by group A streptococcus, which also led to secondary bacteremia. The finger remains swollen and has darkened in color over the past three days. Tenderness is present on the side of the finger. She is currently on antibiotics and experiences nausea as a side effect, which she manages.  She has been taking antibiotics since her hospital stay and is currently on a regimen of two capsules of amoxicillin  three times a day. She experienced nausea with the previous antibiotic.  She reports new onset pain in her right SI joint, right hip, and sometimes her knee, which began around the time she returned home from the hospital. The pain is significant, particularly in the right hip, which has a prosthetic joint placed in 2019. Her history of arthritis may contribute to her symptoms.       Review of Systems  Constitutional:  Negative for chills and fever.  Musculoskeletal:  Positive for joint pain (right hip and right finger).    Past Medical History:  Diagnosis Date   Anxiety    Arthritis    Blood transfusion without reported diagnosis 05/2019   with hip surgery   Complication of anesthesia    Fibromyalgia    Fractured pelvis (HCC) 09/15/2020   right   GERD (gastroesophageal reflux disease)    GI bleed 8111,8009   x2   Hyperlipidemia    Palpitations     Pneumonia 2005   PONV (postoperative nausea and vomiting)    Pre-diabetes    Stomach ulcer 1990    Outpatient Medications Prior to Visit  Medication Sig Dispense Refill   acetaminophen  (TYLENOL ) 500 MG tablet Take 500-1,000 mg by mouth every 6 (six) hours as needed for moderate pain (pain score 4-6).     Ascorbic Acid (VITAMIN C) 1000 MG tablet Take 1,000 mg by mouth in the morning and at bedtime.     b complex vitamins tablet Take 1 tablet by mouth daily.     calcium  carbonate (TUMS EX) 750 MG chewable tablet Chew 1-3 tablets by mouth as needed for heartburn.     CALCIUM  PO Take 2 each by mouth in the morning.     ergocalciferol (VITAMIN D2) 1.25 MG (50000 UT) capsule Take 50,000 Units by mouth once a week.     escitalopram  (LEXAPRO ) 20 MG tablet Take 20 mg by mouth in the morning.     famotidine  (PEPCID ) 40 MG tablet TAKE 1 TABLET(40 MG) BY MOUTH TWICE DAILY 180 tablet 1   gabapentin  (NEURONTIN ) 300 MG capsule Take 900 mg by mouth 2 (two) times daily.     lidocaine  (LIDODERM ) 5 % Place 1 patch onto the skin daily as needed (for pain).     linezolid  (ZYVOX ) 600 MG tablet Take 1 tablet (600 mg total) by mouth 2 (two) times daily for 23 days. 46 tablet 0  LORazepam  (ATIVAN ) 1 MG tablet Take 1 mg by mouth every 8 (eight) hours as needed for anxiety.     losartan  (COZAAR ) 25 MG tablet Take 25 mg by mouth.     Multiple Vitamin (MULTIVITAMIN WITH MINERALS) TABS tablet Take 1 tablet by mouth daily.     nortriptyline  (PAMELOR ) 25 MG capsule Take 75 mg by mouth at bedtime.     pravastatin  (PRAVACHOL ) 80 MG tablet Take 80 mg by mouth in the morning.     tiZANidine  (ZANAFLEX ) 4 MG tablet Take 4 mg by mouth 2 (two) times daily.     traMADol (ULTRAM) 50 MG tablet Take 50-100 mg by mouth every 6 (six) hours as needed.     amLODipine  (NORVASC ) 5 MG tablet Take 5 mg by mouth daily.     Facility-Administered Medications Prior to Visit  Medication Dose Route Frequency Provider Last Rate Last Admin    0.9 %  sodium chloride  infusion  500 mL Intravenous Once Nandigam, Kavitha V, MD         Allergies[1]  Social History[2]  Family History  Problem Relation Age of Onset   Colon cancer Father 68   Stomach cancer Paternal Uncle    Esophageal cancer Neg Hx    Rectal cancer Neg Hx        Objective   Objective:   Vitals:   11/22/24 0916  BP: 130/89  Pulse: 86  Temp: (!) 97.5 F (36.4 C)  TempSrc: Temporal  SpO2: 99%  Weight: 135 lb (61.2 kg)  Height: 5' 2 (1.575 m)   Body mass index is 24.69 kg/m.  Physical Exam Vitals reviewed.  Constitutional:      Appearance: Normal appearance. She is not ill-appearing.  HENT:     Mouth/Throat:     Mouth: Mucous membranes are moist.     Pharynx: Oropharynx is clear.  Eyes:     General: No scleral icterus. Pulmonary:     Effort: Pulmonary effort is normal.  Musculoskeletal:     Comments: Right pointer finger with resolved with ongoing swelling of the distal digit. Improved erythema. Minimal pain. Serous drainage   Skin:    General: Skin is warm and dry.  Neurological:     Mental Status: She is oriented to person, place, and time.  Psychiatric:        Mood and Affect: Mood normal.        Thought Content: Thought content normal.       Assessment & Plan:      Deep Soft Tissue / Cellulitis of right long finger with secondary streptococcal bacteremia - Cellulitis of the right long finger with secondary streptococcal pyogenes bacteremia. The finger shows improvement with regards to erythema, but there is increased swelling and discoloration she is concerned about. She is currently on zyvox  but experiencing nausea. No bone biopsy done during clean out. She still has pain at the finger - Switched to amoxicillin , two capsules three times a day for six weeks total including the time she has already committed to on abx - Ordered CBC and markers of inflammation to monitor infection status. - Scheduled follow-up appointment around 3  weeks  Right hip prosthetic joint pain, rule out infection - New onset right hip pain, possibly related to prosthetic joint infection. The pain is significant and located in the groin area. Differential diagnosis includes arthritis or infection, especially given recent bacteremia. MRI is planned to assess for infection or other causes of pain. If infection is suspected, surgical intervention  may be necessary due to the prosthetic joint's metal components, which complicate treatment. - Ordered MRI of the right hip to evaluate for infection or other causes of pain. - Continue antibiotics while awaiting MRI results. - Will consider referral to orthopedic surgery if MRI suggests infection.  Face to Face visit duration: 22 minutes      Orders Placed This Encounter  Procedures   MR HIP RIGHT W WO CONTRAST    Standing Status:   Future    Expiration Date:   11/22/2025    If indicated for the ordered procedure, I authorize the administration of contrast media per Radiology protocol:   Yes    What is the patient's sedation requirement?:   No Sedation    Does the patient have a pacemaker or implanted devices?:   No    Preferred imaging location?:   [facility] (table limit - 500lbs)   CBC   C-reactive protein    Meds ordered this encounter  Medications   amoxicillin  (AMOXIL ) 500 MG capsule    Sig: Take 2 capsules (1,000 mg total) by mouth 3 (three) times daily.    Dispense:  180 capsule    Refill:  0    FU in 3-4 weeks or pending MRI imaging results.    [name], MSN, NP-C [facility]  [email or web address] Pager: [phone] Office: [phone] [facility] Main Line: [phone] *Secure Chat Communication Welcome      [1]  Allergies Allergen Reactions   Atorvastatin Diarrhea   Bupropion Nausea And Vomiting   Duloxetine Hcl Nausea And Vomiting   Ibuprofen Other (See Comments)    Has had GI bleeds x2 in  past   Sage [Salvia Officinalis] Palpitations and Other (See Comments)    Heart races, flu-like symptoms    Sulfa Antibiotics Nausea And Vomiting   Avelox [Moxifloxacin Hcl In Nacl] Palpitations   Indocin [Indomethacin] Other (See Comments)    Dizziness  [2]  Social History Tobacco Use   Smoking status: Never   Smokeless tobacco: Never  Vaping Use   Vaping status: Never Used  Substance Use Topics   Alcohol use: Not Currently    Comment: rare   Drug use: Never   "

## 2024-11-22 NOTE — Patient Instructions (Addendum)
 Stop the linezolid    Switch to higher dose amoxicillin  - 2 capsules three times a day (7:00 pm, 3:00pm, 10pm)  Will get an MRI of the right hip for you to see if there is anything concerning

## 2024-11-23 LAB — CBC
HCT: 33 % — ABNORMAL LOW (ref 35.9–46.0)
Hemoglobin: 10.5 g/dL — ABNORMAL LOW (ref 11.7–15.5)
MCH: 29.1 pg (ref 27.0–33.0)
MCHC: 31.8 g/dL (ref 31.6–35.4)
MCV: 91.4 fL (ref 81.4–101.7)
MPV: 9.3 fL (ref 7.5–12.5)
Platelets: 235 Thousand/uL (ref 140–400)
RBC: 3.61 Million/uL — ABNORMAL LOW (ref 3.80–5.10)
RDW: 12.7 % (ref 11.0–15.0)
WBC: 4.2 Thousand/uL (ref 3.8–10.8)

## 2024-11-23 LAB — C-REACTIVE PROTEIN: CRP: <3 mg/L

## 2024-11-25 ENCOUNTER — Ambulatory Visit: Payer: Self-pay | Admitting: Infectious Diseases

## 2024-11-26 ENCOUNTER — Ambulatory Visit: Admitting: Cardiology

## 2024-11-29 ENCOUNTER — Ambulatory Visit (HOSPITAL_BASED_OUTPATIENT_CLINIC_OR_DEPARTMENT_OTHER)

## 2024-12-04 ENCOUNTER — Inpatient Hospital Stay (HOSPITAL_COMMUNITY)
Admission: EM | Admit: 2024-12-04 | Discharge: 2024-12-10 | DRG: 872 | Disposition: A | Discharge status: Home / Self Care | Attending: Family Medicine | Admitting: Family Medicine

## 2024-12-04 ENCOUNTER — Inpatient Hospital Stay (HOSPITAL_COMMUNITY)

## 2024-12-04 ENCOUNTER — Emergency Department (HOSPITAL_COMMUNITY)

## 2024-12-04 ENCOUNTER — Other Ambulatory Visit: Payer: Self-pay

## 2024-12-04 ENCOUNTER — Encounter (HOSPITAL_COMMUNITY): Payer: Self-pay | Admitting: Internal Medicine

## 2024-12-04 DIAGNOSIS — Z882 Allergy status to sulfonamides status: Secondary | ICD-10-CM

## 2024-12-04 DIAGNOSIS — R7303 Prediabetes: Secondary | ICD-10-CM | POA: Diagnosis present

## 2024-12-04 DIAGNOSIS — E861 Hypovolemia: Secondary | ICD-10-CM | POA: Diagnosis present

## 2024-12-04 DIAGNOSIS — Z800 Family history of malignant neoplasm of digestive organs: Secondary | ICD-10-CM

## 2024-12-04 DIAGNOSIS — R7881 Bacteremia: Secondary | ICD-10-CM

## 2024-12-04 DIAGNOSIS — Z96641 Presence of right artificial hip joint: Secondary | ICD-10-CM | POA: Diagnosis present

## 2024-12-04 DIAGNOSIS — Z90710 Acquired absence of both cervix and uterus: Secondary | ICD-10-CM

## 2024-12-04 DIAGNOSIS — A400 Sepsis due to streptococcus, group A: Principal | ICD-10-CM | POA: Diagnosis present

## 2024-12-04 DIAGNOSIS — M069 Rheumatoid arthritis, unspecified: Secondary | ICD-10-CM | POA: Diagnosis present

## 2024-12-04 DIAGNOSIS — Z981 Arthrodesis status: Secondary | ICD-10-CM

## 2024-12-04 DIAGNOSIS — L02511 Cutaneous abscess of right hand: Secondary | ICD-10-CM

## 2024-12-04 DIAGNOSIS — F32A Depression, unspecified: Secondary | ICD-10-CM | POA: Diagnosis present

## 2024-12-04 DIAGNOSIS — Z9682 Presence of neurostimulator: Secondary | ICD-10-CM

## 2024-12-04 DIAGNOSIS — R110 Nausea: Secondary | ICD-10-CM | POA: Diagnosis not present

## 2024-12-04 DIAGNOSIS — Z96612 Presence of left artificial shoulder joint: Secondary | ICD-10-CM | POA: Diagnosis present

## 2024-12-04 DIAGNOSIS — R159 Full incontinence of feces: Secondary | ICD-10-CM | POA: Diagnosis present

## 2024-12-04 DIAGNOSIS — F419 Anxiety disorder, unspecified: Secondary | ICD-10-CM | POA: Diagnosis present

## 2024-12-04 DIAGNOSIS — R001 Bradycardia, unspecified: Secondary | ICD-10-CM | POA: Diagnosis present

## 2024-12-04 DIAGNOSIS — R54 Age-related physical debility: Secondary | ICD-10-CM | POA: Diagnosis present

## 2024-12-04 DIAGNOSIS — L03011 Cellulitis of right finger: Secondary | ICD-10-CM | POA: Diagnosis present

## 2024-12-04 DIAGNOSIS — A419 Sepsis, unspecified organism: Secondary | ICD-10-CM | POA: Diagnosis present

## 2024-12-04 DIAGNOSIS — K529 Noninfective gastroenteritis and colitis, unspecified: Secondary | ICD-10-CM | POA: Diagnosis not present

## 2024-12-04 DIAGNOSIS — E785 Hyperlipidemia, unspecified: Secondary | ICD-10-CM | POA: Diagnosis present

## 2024-12-04 DIAGNOSIS — Z79899 Other long term (current) drug therapy: Secondary | ICD-10-CM

## 2024-12-04 DIAGNOSIS — Z8711 Personal history of peptic ulcer disease: Secondary | ICD-10-CM

## 2024-12-04 DIAGNOSIS — K219 Gastro-esophageal reflux disease without esophagitis: Secondary | ICD-10-CM | POA: Diagnosis present

## 2024-12-04 DIAGNOSIS — E876 Hypokalemia: Secondary | ICD-10-CM | POA: Diagnosis present

## 2024-12-04 DIAGNOSIS — R079 Chest pain, unspecified: Secondary | ICD-10-CM | POA: Diagnosis present

## 2024-12-04 DIAGNOSIS — B955 Unspecified streptococcus as the cause of diseases classified elsewhere: Secondary | ICD-10-CM

## 2024-12-04 DIAGNOSIS — M4310 Spondylolisthesis, site unspecified: Secondary | ICD-10-CM | POA: Diagnosis present

## 2024-12-04 DIAGNOSIS — M86641 Other chronic osteomyelitis, right hand: Secondary | ICD-10-CM | POA: Diagnosis present

## 2024-12-04 DIAGNOSIS — M797 Fibromyalgia: Secondary | ICD-10-CM | POA: Diagnosis present

## 2024-12-04 DIAGNOSIS — R32 Unspecified urinary incontinence: Secondary | ICD-10-CM | POA: Diagnosis present

## 2024-12-04 DIAGNOSIS — A0472 Enterocolitis due to Clostridium difficile, not specified as recurrent: Principal | ICD-10-CM | POA: Diagnosis present

## 2024-12-04 DIAGNOSIS — Z888 Allergy status to other drugs, medicaments and biological substances status: Secondary | ICD-10-CM

## 2024-12-04 DIAGNOSIS — M545 Low back pain, unspecified: Secondary | ICD-10-CM | POA: Diagnosis present

## 2024-12-04 DIAGNOSIS — R109 Unspecified abdominal pain: Principal | ICD-10-CM

## 2024-12-04 DIAGNOSIS — E871 Hypo-osmolality and hyponatremia: Secondary | ICD-10-CM | POA: Diagnosis present

## 2024-12-04 DIAGNOSIS — I10 Essential (primary) hypertension: Secondary | ICD-10-CM | POA: Diagnosis present

## 2024-12-04 LAB — CBC
HCT: 35.3 % — ABNORMAL LOW (ref 36.0–46.0)
Hemoglobin: 12.3 g/dL (ref 12.0–15.0)
MCH: 29.4 pg (ref 26.0–34.0)
MCHC: 34.8 g/dL (ref 30.0–36.0)
MCV: 84.2 fL (ref 80.0–100.0)
Platelets: 364 10*3/uL (ref 150–400)
RBC: 4.19 MIL/uL (ref 3.87–5.11)
RDW: 13.7 % (ref 11.5–15.5)
WBC: 12.2 10*3/uL — ABNORMAL HIGH (ref 4.0–10.5)
nRBC: 0 % (ref 0.0–0.2)

## 2024-12-04 LAB — I-STAT CG4 LACTIC ACID, ED: Lactic Acid, Venous: 1.2 mmol/L (ref 0.5–1.9)

## 2024-12-04 LAB — URINALYSIS, ROUTINE W REFLEX MICROSCOPIC
Bilirubin Urine: NEGATIVE
Glucose, UA: NEGATIVE mg/dL
Hgb urine dipstick: NEGATIVE
Ketones, ur: 5 mg/dL — AB
Leukocytes,Ua: NEGATIVE
Nitrite: NEGATIVE
Protein, ur: 30 mg/dL — AB
Specific Gravity, Urine: 1.02 (ref 1.005–1.030)
pH: 6 (ref 5.0–8.0)

## 2024-12-04 LAB — COMPREHENSIVE METABOLIC PANEL WITH GFR
ALT: 23 U/L (ref 0–44)
AST: 23 U/L (ref 15–41)
Albumin: 3.6 g/dL (ref 3.5–5.0)
Alkaline Phosphatase: 194 U/L — ABNORMAL HIGH (ref 38–126)
Anion gap: 12 (ref 5–15)
BUN: 31 mg/dL — ABNORMAL HIGH (ref 8–23)
CO2: 26 mmol/L (ref 22–32)
Calcium: 10 mg/dL (ref 8.9–10.3)
Chloride: 89 mmol/L — ABNORMAL LOW (ref 98–111)
Creatinine, Ser: 0.73 mg/dL (ref 0.44–1.00)
GFR, Estimated: >60 mL/min
Glucose, Bld: 125 mg/dL — ABNORMAL HIGH (ref 70–99)
Potassium: 3.9 mmol/L (ref 3.5–5.1)
Sodium: 127 mmol/L — ABNORMAL LOW (ref 135–145)
Total Bilirubin: 0.6 mg/dL (ref 0.0–1.2)
Total Protein: 6.3 g/dL — ABNORMAL LOW (ref 6.5–8.1)

## 2024-12-04 LAB — LIPASE, BLOOD: Lipase: 13 U/L (ref 11–51)

## 2024-12-04 MED ORDER — ALBUTEROL SULFATE (2.5 MG/3ML) 0.083% IN NEBU: 2.5000 mg | INHALATION_SOLUTION | RESPIRATORY_TRACT | Status: DC | PRN

## 2024-12-04 MED ORDER — SODIUM CHLORIDE 0.9 % IV BOLUS
1000.0000 mL | Freq: Once | INTRAVENOUS | Status: AC
  Administered 2024-12-04: 1000 mL via INTRAVENOUS

## 2024-12-04 MED ORDER — IOHEXOL 300 MG/ML  SOLN
100.0000 mL | Freq: Once | INTRAMUSCULAR | Status: AC | PRN
  Administered 2024-12-04: 100 mL via INTRAVENOUS

## 2024-12-04 MED ORDER — METRONIDAZOLE 500 MG/100ML IV SOLN
500.0000 mg | Freq: Three times a day (TID) | INTRAVENOUS | Status: DC
  Administered 2024-12-04 – 2024-12-05 (×3): 500 mg via INTRAVENOUS
  Filled 2024-12-04 (×3): qty 100

## 2024-12-04 MED ORDER — PENICILLIN G POT IN DEXTROSE 60000 UNIT/ML IV SOLN
3.0000 10*6.[IU] | INTRAVENOUS | Status: DC
  Filled 2024-12-04 (×4): qty 50

## 2024-12-04 MED ORDER — ACETAMINOPHEN 650 MG RE SUPP: 650.0000 mg | Freq: Four times a day (QID) | RECTAL | Status: DC | PRN

## 2024-12-04 MED ORDER — ACETAMINOPHEN 325 MG PO TABS
650.0000 mg | ORAL_TABLET | Freq: Four times a day (QID) | ORAL | Status: DC | PRN
  Administered 2024-12-06 – 2024-12-08 (×6): 650 mg via ORAL
  Filled 2024-12-04 (×7): qty 2

## 2024-12-04 MED ORDER — ONDANSETRON HCL 4 MG/2ML IJ SOLN
4.0000 mg | Freq: Once | INTRAMUSCULAR | Status: AC
  Administered 2024-12-04: 4 mg via INTRAVENOUS
  Filled 2024-12-04: qty 2

## 2024-12-04 MED ORDER — PANTOPRAZOLE SODIUM 40 MG IV SOLR
40.0000 mg | INTRAVENOUS | Status: DC
  Administered 2024-12-04 – 2024-12-05 (×2): 40 mg via INTRAVENOUS
  Filled 2024-12-04 (×2): qty 10

## 2024-12-04 MED ORDER — SODIUM CHLORIDE 0.9 % IV SOLN: INTRAVENOUS | Status: DC

## 2024-12-04 MED ORDER — FIDAXOMICIN 200 MG PO TABS
200.0000 mg | ORAL_TABLET | Freq: Two times a day (BID) | ORAL | Status: DC
  Administered 2024-12-04 – 2024-12-05 (×2): 200 mg via ORAL
  Filled 2024-12-04 (×2): qty 1

## 2024-12-04 MED ORDER — NORTRIPTYLINE HCL 25 MG PO CAPS
75.0000 mg | ORAL_CAPSULE | Freq: Every day | ORAL | Status: DC
  Administered 2024-12-04 – 2024-12-09 (×6): 75 mg via ORAL
  Filled 2024-12-04 (×6): qty 3

## 2024-12-04 MED ORDER — ONDANSETRON HCL 4 MG/2ML IJ SOLN
4.0000 mg | Freq: Four times a day (QID) | INTRAMUSCULAR | Status: DC | PRN
  Administered 2024-12-04 – 2024-12-07 (×5): 4 mg via INTRAVENOUS
  Filled 2024-12-04 (×5): qty 2

## 2024-12-04 MED ORDER — ENOXAPARIN SODIUM 40 MG/0.4ML IJ SOSY
40.0000 mg | PREFILLED_SYRINGE | INTRAMUSCULAR | Status: DC
  Administered 2024-12-04 – 2024-12-09 (×5): 40 mg via SUBCUTANEOUS
  Filled 2024-12-04 (×5): qty 0.4

## 2024-12-04 MED ORDER — ONDANSETRON HCL 4 MG PO TABS: 4.0000 mg | ORAL_TABLET | Freq: Four times a day (QID) | ORAL | Status: DC | PRN

## 2024-12-04 MED ORDER — PENICILLIN G POTASSIUM 5000000 UNITS IJ SOLR
3.0000 10*6.[IU] | INTRAVENOUS | Status: AC
  Administered 2024-12-04 – 2024-12-05 (×4): 3 10*6.[IU] via INTRAVENOUS
  Filled 2024-12-04 (×6): qty 3

## 2024-12-04 NOTE — ED Notes (Signed)
 First poc with patient, pt resting in bed.  Pt connected to continuous cardiac, pulse ox and bp monitoring by primary RN,  no respiratory distress noted. Pt incontinent of stool. Pt cleansed and repositioned in bed, tolerated fairly with assist.  Attempted to collect stool sample, not enough to collect.  Pt denies pain.  C/o burning sensation to abdomen.

## 2024-12-04 NOTE — ED Notes (Signed)
 Patient incontinent of stool.  Patient cleansed and repositioned in bed, tolerated fairly. Became nauseous with movement.  Elevated HOB.  Pt remains on continuous cardiac, pulse ox and bp monitoring.  Changed pulse oximeter to new one to have better reading.  No respiratory distress noted Skin intact

## 2024-12-04 NOTE — ED Provider Notes (Signed)
 Handoff from [facility] - see note for further HPI Suspect Cdif with pending cultures Lower abdominal tenderness  Pending CT imaging and then admission decision  Physical Exam  BP (!) 146/83   Pulse (!) 40   Temp (!) 97.5 F (36.4 C) (Oral)   LMP  (LMP Unknown)   Physical Exam  Procedures  Procedures  ED Course / MDM   Clinical Course as of 12/04/24 1633  Wed Dec 04, 2024  1446 Possible cdiff - tender in lower abdomen - pending stool sample.  [ML]    Clinical Course User Index [ML] Willma Duwaine CROME, [place]   Medical Decision Making Amount and/or Complexity of Data Reviewed Labs: ordered. Radiology: ordered.  Risk Prescription drug management.   Patient presents to the ED for: Abdominal pain  Clinical Course as of 12/09/24 0656  Wed Dec 04, 2024  1446 Possible cdiff - tender in lower abdomen - pending stool sample.  [ML]    Clinical Course User Index [ML] Willma Duwaine CROME, [place]    Data Reviewed / Actions Taken: Imaging ordered - showed severe diffuse colonic wall thickening and edema -  I agree with the radiologists interpretation.   ED Course / Reassessments: Problem List:  79 year old female presented for abdominal pain. Initial assessment included review of handoff provider history, physical exam, and also review of prior medical records. Laboratory studies, imaging, and other ancillary studies were obtained and key results included leukocytosis, hyponatremia, and severe diffuse colonic wall thickening on imaging. Management included analgesic therapy with little relief, with ongoing reassessment. Vital signs were obtained and monitored, and the patient remained stable throughout the stay.  Given patient's laboratory studies and imaging, continued abdominal pain, patient to be admitted for further evaluation and care of suspected worsening C. difficile infection  Consultations:  Hospitalist GLENWOOD Gail MD Consult recommendations incorporated into plan: to admit patient  for further evaluation and care   Disposition: Disposition: Admission Rationale for disposition: abdominal pain, pending cdiff results  This note was produced using Dragon Medical voice recognition. While I have reviewed and verified all clinical information, transcription errors may remain.        Willma Duwaine CROME, [place] 12/09/24 0704    Elnor Bernarda SQUIBB, DO 12/09/24 2257

## 2024-12-04 NOTE — H&P (Addendum)
 " History and Physical  Lakeshia Dohner FMW:[id number] DOB: 01-17-1946 DOA: 12/04/2024  PCP: [name], MD   Chief Complaint: Abdominal pain, diarrhea  HPI: Margrett Kalb is a 79 y.o. female with medical history significant for anxiety, GERD, hyperlipidemia, hypertension, RA not on DMARDs recently hospitalized December 2025 with right index finger cellulitis, abscess and secondary streptococcal bacteremia currently being treated with amoxicillin  being admitted to the hospital with several days of abdominal pain, nausea and copious liquid stool found to have colitis suspicious for C. difficile colitis.  She was initially discharged from the hospital 11/07/2024 with plan for 4 weeks of linezolid  twice daily, and oral penicillin  thereafter.  She followed up with the ID clinic earlier this month, complained of significant nausea and was switched to oral amoxicillin  which she was taking up until about 3 days ago, when she started having significant abdominal pain, bloating, watery diarrhea.  She has incontinence of stool, has also been vomiting, the last time this morning.  She denies any fevers, but she has been having subjective chills.  Denies any blood in her stool or emesis.  Workup in the emergency department as detailed below shows evidence of pancolitis.  Review of Systems: Please see HPI for pertinent positives and negatives. A complete 10 system review of systems are otherwise negative.  Past Medical History:  Diagnosis Date   Anxiety    Arthritis    Blood transfusion without reported diagnosis 05/2019   with hip surgery   Complication of anesthesia    Fibromyalgia    Fractured pelvis (HCC) 09/15/2020   right   GERD (gastroesophageal reflux disease)    GI bleed 8111,8009   x2   Hyperlipidemia    Palpitations    Pneumonia 2005   PONV (postoperative nausea and vomiting)    Pre-diabetes    Stomach ulcer 1990   Past Surgical History:  Procedure Laterality Date   ABDOMINAL  HYSTERECTOMY     FRACTURE SURGERY Right 1998   ankle   INCISION AND DRAINAGE OF WOUND Right 11/05/2024   Procedure: IRRIGATION AND DEBRIDEMENT RIGHT INDEX FINGER WOUND;  Surgeon: Murrell Drivers, MD;  Location: [facility] OR;  Service: Orthopedics;  Laterality: Right;   JOINT REPLACEMENT     LUMBAR FUSION  2015   OPEN REDUCTION INTERNAL FIXATION (ORIF) PROXIMAL PHALANX Right 09/21/2020   Procedure: OPEN TREATMENT OF RIGHT LONG FINGER PROXIMAL PHALANX FRACTURE;  Surgeon: Sebastian Lenis, MD;  Location: [facility] OR;  Service: Orthopedics;  Laterality: Right;  LENGTH OF SURGERY: 75 MIN   ORIF HUMERUS FRACTURE Left    ORIF HUMERUS FRACTURE Left 05/15/2023   Procedure: OPEN REDUCTION INTERNAL FIXATION (ORIF) PERIPROSTHETIC HUMERUS FRACTURE WITH PLATE REMOVAL;  Surgeon: Dozier Soulier, MD;  Location: [facility] ORS;  Service: Orthopedics;  Laterality: Left;   ORIF TIBIA FRACTURE Right 1998   with bone graft   PYLOROPLASTY     REVERSE SHOULDER ARTHROPLASTY Left 02/04/2021   Procedure: REVERSE SHOULDER ARTHROPLASTY WITH HARDWARE REMOVAL;  Surgeon: Dozier Soulier, MD;  Location: [facility] ORS;  Service: Orthopedics;  Laterality: Left;   TOTAL HIP ARTHROPLASTY  2011   TOTAL HIP ARTHROPLASTY Right 05/14/2019   Procedure: Right Anterior Hip Arthroplasty;  Surgeon: Sheril Coy, MD;  Location: [facility] ORS;  Service: Orthopedics;  Laterality: Right;   TOTAL SHOULDER REPLACEMENT  2014   Social History:  reports that she has never smoked. She has never used smokeless tobacco. She reports that she does not currently use alcohol. She reports that she does not use drugs.  Allergies[1]  Family History  Problem Relation Age of Onset   Colon cancer Father 13   Stomach cancer Paternal Uncle    Esophageal cancer Neg Hx    Rectal cancer Neg Hx      Prior to Admission medications  Medication Sig Start Date End Date Taking? Authorizing Provider  acetaminophen  (TYLENOL ) 500 MG tablet Take 500-1,000 mg by mouth every 6 (six) hours as needed  for moderate pain (pain score 4-6).    [provider]  amLODipine  (NORVASC ) 5 MG tablet Take 5 mg by mouth daily.    [provider]  amoxicillin  (AMOXIL ) 500 MG capsule Take 2 capsules (1,000 mg total) by mouth 3 (three) times daily. 11/22/24   Melvenia Corean SAILOR, NP  Ascorbic Acid (VITAMIN C) 1000 MG tablet Take 1,000 mg by mouth in the morning and at bedtime.    [provider]  b complex vitamins tablet Take 1 tablet by mouth daily.    [provider]  calcium  carbonate (TUMS EX) 750 MG chewable tablet Chew 1-3 tablets by mouth as needed for heartburn.    [provider]  CALCIUM  PO Take 2 each by mouth in the morning.    [provider]  ergocalciferol (VITAMIN D2) 1.25 MG (50000 UT) capsule Take 50,000 Units by mouth once a week. 08/26/20   [provider]  escitalopram  (LEXAPRO ) 20 MG tablet Take 20 mg by mouth in the morning.    [provider]  famotidine  (PEPCID ) 40 MG tablet TAKE 1 TABLET(40 MG) BY MOUTH TWICE DAILY 07/12/23   Aneita Gwendlyn DASEN, MD  gabapentin  (NEURONTIN ) 300 MG capsule Take 900 mg by mouth 2 (two) times daily.    [provider]  lidocaine  (LIDODERM ) 5 % Place 1 patch onto the skin daily as needed (for pain).    [provider]  LORazepam  (ATIVAN ) 1 MG tablet Take 1 mg by mouth every 8 (eight) hours as needed for anxiety.    [provider]  losartan  (COZAAR ) 25 MG tablet Take 25 mg by mouth. 10/09/24   [provider]  Multiple Vitamin (MULTIVITAMIN WITH MINERALS) TABS tablet Take 1 tablet by mouth daily.    [provider]  nortriptyline  (PAMELOR ) 25 MG capsule Take 75 mg by mouth at bedtime.    [provider]  pravastatin  (PRAVACHOL ) 80 MG tablet Take 80 mg by mouth in the morning.    [provider]  tiZANidine  (ZANAFLEX ) 4 MG tablet Take 4 mg by mouth 2 (two) times daily.    [provider]  traMADol (ULTRAM) 50 MG tablet Take  50-100 mg by mouth every 6 (six) hours as needed.    [provider]    Physical Exam: BP (!) 146/83   Pulse (!) 40   Temp (!) 97.5 F (36.4 C) (Oral)   LMP  (LMP Unknown)  General:  Alert, oriented, calm, looks generally uncomfortable, but stable and not in acute distress Eyes: EOMI, clear conjuctivae, white sclerea Neck: supple, no masses, trachea mildline  Cardiovascular: RRR, no murmurs or rubs, no peripheral edema  Respiratory: clear to auscultation bilaterally, no wheezes, no crackles  Abdomen: soft, diffusely tender, mildly distended, with voluntary guarding, normal bowel tones heard  Skin: dry, no rashes  Musculoskeletal: no joint effusions, normal range of motion  Psychiatric: appropriate affect, normal speech  Neurologic: extraocular muscles intact, clear speech, moving all extremities with intact sensorium         Labs on Admission:  Basic Metabolic Panel:  Recent Labs  Lab 12/04/24 1445  NA 127*  K 3.9  CL 89*  CO2 26  GLUCOSE 125*  BUN 31*  CREATININE 0.73  CALCIUM  10.0   Liver Function Tests: Recent Labs  Lab 12/04/24 1445  AST 23  ALT 23  ALKPHOS 194*  BILITOT 0.6  PROT 6.3*  ALBUMIN 3.6   Recent Labs  Lab 12/04/24 1445  LIPASE 13   No results for input(s): AMMONIA in the last 168 hours. CBC: Recent Labs  Lab 12/04/24 1445  WBC 12.2*  HGB 12.3  HCT 35.3*  MCV 84.2  PLT 364   Cardiac Enzymes: No results for input(s): CKTOTAL, CKMB, CKMBINDEX, TROPONINI in the last 168 hours. BNP (last 3 results) No results for input(s): BNP in the last 8760 hours.  ProBNP (last 3 results) No results for input(s): PROBNP in the last 8760 hours.  CBG: No results for input(s): GLUCAP in the last 168 hours.  Radiological Exams on Admission: CT ABDOMEN PELVIS W CONTRAST Result Date: 12/04/2024 EXAM: CT ABDOMEN AND PELVIS WITH CONTRAST 12/04/2024 03:56:35 PM TECHNIQUE: CT of the abdomen and pelvis was performed with the  administration of 100 mL of iohexol  (OMNIPAQUE ) 300 MG/ML solution. Multiplanar reformatted images are provided for review. Automated exposure control, iterative reconstruction, and/or weight-based adjustment of the mA/kV was utilized to reduce the radiation dose to as low as reasonably achievable. COMPARISON: 10/26/2022 CLINICAL HISTORY: Abdominal pain, acute, nonlocalized. Diarrhea, nausea, fever, and chills since Saturday. FINDINGS: LOWER CHEST: Mild atelectasis or infiltration in the right lung base. LIVER: The liver is unremarkable. GALLBLADDER AND BILE DUCTS: Gallbladder is unremarkable. No biliary ductal dilatation. SPLEEN: No acute abnormality. PANCREAS: No acute abnormality. ADRENAL GLANDS: No acute abnormality. KIDNEYS, URETERS AND BLADDER: No stones in the kidneys or ureters. No hydronephrosis. No perinephric or periureteral stranding. The visualized bladder is unremarkable. Visualization of the bladder and pelvic contents is limited due to streak artifact from bilateral hip arthroplasties. GI AND BOWEL: Surgical clips at the eg junction. There is severe diffuse colonic wall thickening and edema throughout the entire colon, changes are consistent with colitis, possibly pseudomembranous colitis, although other infectious or inflammatory etiologies could also have this appearance. Small bowel and stomach are mostly decompressed. There is no bowel obstruction. PERITONEUM AND RETROPERITONEUM: No ascites. No free air. No loculated collections. Pelvic organs, if present, are not visualized due to streak artifact. VASCULATURE: Aorta is normal in caliber. Calcification of the aorta. No aneurysm. LYMPH NODES: No lymphadenopathy. REPRODUCTIVE ORGANS: Pelvic organs, if present, are not visualized due to streak artifact. BONES AND SOFT TISSUES: Multiple old bilateral rib fractures. 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 Posterior laminectomies with posterior rod and screw fixation  of the lower lumbar spine. Metallic foreign body demonstrated in the subcutaneous soft tissues over the left side of the back. Bilateral hip arthroplasties are present. IMPRESSION: 1. Severe diffuse colonic wall thickening and edema throughout the entire colon, consistent with colitis, possibly pseudomembranous colitis. Other infectious or inflammatory etiologies could also have this appearance. 2. No free air or free fluid. No loculated collections. Electronically signed by: Elsie Gravely MD 12/04/2024 04:30 PM EST RP Workstation: [id number]   Assessment/Plan Sylvania Moss is a 79 y.o. female with medical history significant for anxiety, GERD, hyperlipidemia, hypertension, RA not on DMARDs recently hospitalized December 2025 with right index finger cellulitis, abscess and secondary streptococcal bacteremia currently being treated with amoxicillin  being admitted to the hospital with several days of abdominal pain, nausea  and copious liquid stool found to have colitis suspicious for C. difficile colitis.    Sepsis due to pancolitis-meeting criteria with tachycardia, leukocytosis.  No evidence of endorgan dysfunction, hemodynamically stable with normal lactate.  In the setting of antibiotic therapy, abdominal tenderness, incontinence of copious watery diarrhea, this is concerning for C. difficile colitis.  Discussed with ID, will start treatment empirically. -Inpatient admission -Check blood culture x 2 -Clear liquid diet as tolerated -Follow-up GI stool panel, C. difficile panel -Start empiric IV Flagyl  every 8 hours, as well as p.o. Dificid  with premedication with IV Zofran  (due to severe nausea)  Finger cellulitis-with secondary bacteremia with strep pyogenes.  She has been on p.o. amoxicillin  per ID, but has not been able to keep this medication down for the last 3 days. -IV penicillin  G until can tolerate p.o.  Hyponatremia-I suspect this is a hypovolemic hyponatremia due to her vomiting and  reduced p.o. intake. -IV normal saline infusion -Follow sodium level with morning labs  Right hip pain-she has history of previous ORIF periprosthetic fracture in July 2024 with plate removal, ID had ordered outpatient MRI of the right hip due to ongoing pain, to rule out acute infection or occult fracture in that area. -Routine MR with and without of right hip ordered  GERD-IV Protonix   Due to severe nausea and inability to tolerate p.o. medications, will hold nonvital home medications for the time being.  DVT prophylaxis: Lovenox      Code Status: Full Code  Consults called: ID Dr. Luiz  Admission status: The appropriate patient status for this patient is INPATIENT. Inpatient status is judged to be reasonable and necessary in order to provide the required intensity of service to ensure the patient's safety. The patient's presenting symptoms, physical exam findings, and initial radiographic and laboratory data in the context of their chronic comorbidities is felt to place them at high risk for further clinical deterioration. Furthermore, it is not anticipated that the patient will be medically stable for discharge from the hospital within 2 midnights of admission.    I certify that at the point of admission it is my clinical judgment that the patient will require inpatient hospital care spanning beyond 2 midnights from the point of admission due to high intensity of service, high risk for further deterioration and high frequency of surveillance required  Time spent: 53 minutes  Marvion Bastidas CHRISTELLA Gail MD [facility] Pager [phone]  If 7PM-7AM, please contact night-coverage [email or web address] Password [facility]  12/04/2024, 5:26 PM      [1]  Allergies Allergen Reactions   Atorvastatin Diarrhea   Bupropion Nausea And Vomiting   Duloxetine Hcl Nausea And Vomiting   Ibuprofen Other (See Comments)    Has had GI bleeds x2 in past   Sage [Salvia Officinalis] Palpitations and Other (See  Comments)    Heart races, flu-like symptoms    Sulfa Antibiotics Nausea And Vomiting   Avelox [Moxifloxacin Hcl In Nacl] Palpitations   Indocin [Indomethacin] Other (See Comments)    Dizziness   "

## 2024-12-04 NOTE — ED Provider Notes (Signed)
 " [place] EMERGENCY DEPARTMENT AT [facility] Provider Note   CSN: [id number] Arrival date & time: 12/04/24  1255     Patient presents with: Abdominal Pain and Diarrhea   Tanya Duncan is a 79 y.o. female called past medical history of fibromyalgia, GERD, hyperlipidemia, arthritis, recent osteomyelitis of right pointer finger who presents concern for nausea, vomiting, abdominal pain, fever, chills.  Frequent diarrhea, concern for GI infection/C. difficile.  Has been recently on antibiotics because of the osteomyelitis.  She reports she has not been able to tolerate the antibiotics for the last few days.    Abdominal Pain Associated symptoms: diarrhea   Diarrhea Associated symptoms: abdominal pain        Prior to Admission medications  Medication Sig Start Date End Date Taking? Authorizing Provider  acetaminophen  (TYLENOL ) 500 MG tablet Take 500-1,000 mg by mouth every 6 (six) hours as needed for moderate pain (pain score 4-6).    [provider]  amLODipine  (NORVASC ) 5 MG tablet Take 5 mg by mouth daily.    [provider]  amoxicillin  (AMOXIL ) 500 MG capsule Take 2 capsules (1,000 mg total) by mouth 3 (three) times daily. 11/22/24   Melvenia Corean SAILOR, NP  Ascorbic Acid (VITAMIN C) 1000 MG tablet Take 1,000 mg by mouth in the morning and at bedtime.    [provider]  b complex vitamins tablet Take 1 tablet by mouth daily.    [provider]  calcium  carbonate (TUMS EX) 750 MG chewable tablet Chew 1-3 tablets by mouth as needed for heartburn.    [provider]  CALCIUM  PO Take 2 each by mouth in the morning.    [provider]  ergocalciferol (VITAMIN D2) 1.25 MG (50000 UT) capsule Take 50,000 Units by mouth once a week. 08/26/20   [provider]  escitalopram  (LEXAPRO ) 20 MG tablet Take 20 mg by mouth in the morning.    [provider]  famotidine  (PEPCID ) 40 MG tablet TAKE 1 TABLET(40 MG) BY  MOUTH TWICE DAILY 07/12/23   Aneita Gwendlyn DASEN, MD  gabapentin  (NEURONTIN ) 300 MG capsule Take 900 mg by mouth 2 (two) times daily.    [provider]  lidocaine  (LIDODERM ) 5 % Place 1 patch onto the skin daily as needed (for pain).    [provider]  LORazepam  (ATIVAN ) 1 MG tablet Take 1 mg by mouth every 8 (eight) hours as needed for anxiety.    [provider]  losartan  (COZAAR ) 25 MG tablet Take 25 mg by mouth. 10/09/24   [provider]  Multiple Vitamin (MULTIVITAMIN WITH MINERALS) TABS tablet Take 1 tablet by mouth daily.    [provider]  nortriptyline  (PAMELOR ) 25 MG capsule Take 75 mg by mouth at bedtime.    [provider]  pravastatin  (PRAVACHOL ) 80 MG tablet Take 80 mg by mouth in the morning.    [provider]  tiZANidine  (ZANAFLEX ) 4 MG tablet Take 4 mg by mouth 2 (two) times daily.    [provider]  traMADol (ULTRAM) 50 MG tablet Take 50-100 mg by mouth every 6 (six) hours as needed.    [provider]    Allergies: Atorvastatin, Bupropion, Duloxetine hcl, Ibuprofen, Sage [salvia officinalis], Sulfa antibiotics, Avelox [moxifloxacin hcl in nacl], and Indocin [indomethacin]    Review of Systems  Gastrointestinal:  Positive for abdominal pain and diarrhea.  All other systems reviewed and are negative.   Updated Vital Signs BP (!) 146/83  Pulse (!) 40   Temp (!) 97.5 F (36.4 C) (Oral)   LMP  (LMP Unknown)   Physical Exam Vitals and nursing note reviewed.  Constitutional:      General: She is not in acute distress.    Appearance: Normal appearance.  HENT:     Head: Normocephalic and atraumatic.  Eyes:     General:        Right eye: No discharge.        Left eye: No discharge.  Cardiovascular:     Rate and Rhythm: Normal rate and regular rhythm.     Heart sounds: No murmur heard.    No friction rub. No gallop.  Pulmonary:     Effort: Pulmonary effort is normal.     Breath sounds:  Normal breath sounds.  Abdominal:     General: Bowel sounds are normal.     Palpations: Abdomen is soft.     Comments: Focal tenderness to palpation in the lower abdomen, no rebound, rigidity, some guarding, no overt distention  Skin:    General: Skin is warm and dry.     Capillary Refill: Capillary refill takes less than 2 seconds.  Neurological:     Mental Status: She is alert and oriented to person, place, and time.  Psychiatric:        Mood and Affect: Mood normal.        Behavior: Behavior normal.     (all labs ordered are listed, but only abnormal results are displayed) Labs Reviewed  URINALYSIS, ROUTINE W REFLEX MICROSCOPIC - Abnormal; Notable for the following components:      Result Value   APPearance HAZY (*)    Ketones, ur 5 (*)    Protein, ur 30 (*)    Bacteria, UA RARE (*)    All other components within normal limits  GASTROINTESTINAL PANEL BY PCR, STOOL (REPLACES STOOL CULTURE)  C DIFFICILE QUICK SCREEN W PCR REFLEX    CBC  COMPREHENSIVE METABOLIC PANEL WITH GFR  LIPASE, BLOOD  I-STAT CG4 LACTIC ACID, ED    EKG: EKG Interpretation Date/Time:  Wednesday December 04 2024 14:22:10 EST Ventricular Rate:  111 PR Interval:  172 QRS Duration:  85 QT Interval:  342 QTC Calculation: 465 R Axis:   -87  Text Interpretation: Sinus tachycardia LAD, consider LAFB or inferior infarct Confirmed by Ruthe Cornet [phone]) on 12/04/2024 2:28:04 PM  Radiology: No results found.   Procedures   Medications Ordered in the ED  ondansetron  (ZOFRAN ) injection 4 mg (4 mg Intravenous Given 12/04/24 1413)  sodium chloride  0.9 % bolus 1,000 mL (1,000 mLs Intravenous New Bag/Given 12/04/24 1413)                                    Medical Decision Making  This patient is a 79 y.o. female  who presents to the ED for concern of abdominal pain, diarrhea.   Differential diagnoses prior to evaluation: The emergent differential diagnosis includes, but is not limited to,  The  causes of generalized abdominal pain include but are not limited to AAA, mesenteric ischemia, appendicitis, diverticulitis, DKA, gastritis, gastroenteritis, AMI, nephrolithiasis, pancreatitis, peritonitis, adrenal insufficiency,lead poisoning, iron toxicity, intestinal ischemia, constipation, UTI,SBO/LBO, splenic rupture, biliary disease, IBD, IBS, PUD, or hepatitis -- infectious diarrhea, cdiff, vs other . This is not an exhaustive differential.   Past Medical History / Co-morbidities / Social History: fibromyalgia, GERD, hyperlipidemia, arthritis, recent osteomyelitis  of right pointer finger  Additional history: Chart reviewed. Pertinent results include: Provide outpatient infectious disease, cardiology visits, recent lab work, imaging from ED hospital admission  Physical Exam: Physical exam performed. The pertinent findings include:  Focal tenderness to palpation in the lower abdomen, no rebound, rigidity, some guarding, no overt distention   Afebrile, mildly hypertensive, blood pressure 146/83.  Bradycardia, pulse 40 on initial assessment, however EKG shows sinus tachycardia  Lab Tests/Imaging studies: I personally interpreted labs/imaging and the pertinent results include: Pending lab work, imaging. UA ketones, protein, no evidence of infection. Normal initial lactic acid.  Cardiac monitoring: EKG obtained and interpreted by myself and attending physician which shows: Sinus tachycardia   Medications: I ordered medication including Zofran , fluid bolus.  I have reviewed the patients home medicines and have made adjustments as needed.   2:46 PM Care of Gila Lauf transferred to [facility] and Dr. Ula at the end of my shift as the patient will require reassessment once labs/imaging have resulted. Patient presentation, ED course, and plan of care discussed with review of all pertinent labs and imaging. Please see his/her note for further details regarding further ED course and  disposition. Plan at time of handoff is pending labwork, imaging, stool studies. This may be altered or completely changed at the discretion of the oncoming team pending results of further workup.   Final diagnoses:  None    ED Discharge Orders     None          Rosan Sherlean DEL, PA-C 12/04/24 1446    Ruthe Cornet, DO 12/04/24 1507  "

## 2024-12-04 NOTE — ED Triage Notes (Signed)
 Pt BIB EMS due to uncontrolled diarrhea along with nausea, fever and chills since Saturday.

## 2024-12-05 ENCOUNTER — Ambulatory Visit: Admitting: Cardiology

## 2024-12-05 ENCOUNTER — Telehealth (HOSPITAL_COMMUNITY): Payer: Self-pay | Admitting: Pharmacy Technician

## 2024-12-05 ENCOUNTER — Other Ambulatory Visit (HOSPITAL_COMMUNITY): Payer: Self-pay

## 2024-12-05 DIAGNOSIS — M069 Rheumatoid arthritis, unspecified: Secondary | ICD-10-CM | POA: Diagnosis not present

## 2024-12-05 DIAGNOSIS — B955 Unspecified streptococcus as the cause of diseases classified elsewhere: Secondary | ICD-10-CM

## 2024-12-05 DIAGNOSIS — A0472 Enterocolitis due to Clostridium difficile, not specified as recurrent: Secondary | ICD-10-CM | POA: Diagnosis not present

## 2024-12-05 DIAGNOSIS — R7881 Bacteremia: Secondary | ICD-10-CM | POA: Diagnosis not present

## 2024-12-05 DIAGNOSIS — A419 Sepsis, unspecified organism: Secondary | ICD-10-CM | POA: Diagnosis present

## 2024-12-05 DIAGNOSIS — L02511 Cutaneous abscess of right hand: Secondary | ICD-10-CM | POA: Diagnosis not present

## 2024-12-05 DIAGNOSIS — M86641 Other chronic osteomyelitis, right hand: Secondary | ICD-10-CM

## 2024-12-05 DIAGNOSIS — A400 Sepsis due to streptococcus, group A: Secondary | ICD-10-CM

## 2024-12-05 DIAGNOSIS — K529 Noninfective gastroenteritis and colitis, unspecified: Secondary | ICD-10-CM | POA: Diagnosis not present

## 2024-12-05 LAB — CBC
HCT: 30.9 % — ABNORMAL LOW (ref 36.0–46.0)
Hemoglobin: 10.2 g/dL — ABNORMAL LOW (ref 12.0–15.0)
MCH: 28.8 pg (ref 26.0–34.0)
MCHC: 33 g/dL (ref 30.0–36.0)
MCV: 87.3 fL (ref 80.0–100.0)
Platelets: 343 10*3/uL (ref 150–400)
RBC: 3.54 MIL/uL — ABNORMAL LOW (ref 3.87–5.11)
RDW: 13.5 % (ref 11.5–15.5)
WBC: 11.4 10*3/uL — ABNORMAL HIGH (ref 4.0–10.5)
nRBC: 0 % (ref 0.0–0.2)

## 2024-12-05 LAB — BASIC METABOLIC PANEL WITH GFR
Anion gap: 13 (ref 5–15)
BUN: 18 mg/dL (ref 8–23)
CO2: 20 mmol/L — ABNORMAL LOW (ref 22–32)
Calcium: 7.6 mg/dL — ABNORMAL LOW (ref 8.9–10.3)
Chloride: 97 mmol/L — ABNORMAL LOW (ref 98–111)
Creatinine, Ser: 0.47 mg/dL (ref 0.44–1.00)
GFR, Estimated: >60 mL/min
Glucose, Bld: 113 mg/dL — ABNORMAL HIGH (ref 70–99)
Potassium: 2.8 mmol/L — ABNORMAL LOW (ref 3.5–5.1)
Sodium: 130 mmol/L — ABNORMAL LOW (ref 135–145)

## 2024-12-05 LAB — TROPONIN T, HIGH SENSITIVITY
Troponin T High Sensitivity: 16 ng/L (ref 0–19)
Troponin T High Sensitivity: 14 ng/L (ref 0–19)

## 2024-12-05 LAB — MAGNESIUM: Magnesium: 1.8 mg/dL (ref 1.7–2.4)

## 2024-12-05 LAB — C DIFFICILE QUICK SCREEN W PCR REFLEX
C Diff antigen: POSITIVE — AB
C Diff interpretation: DETECTED
C Diff toxin: POSITIVE — AB

## 2024-12-05 MED ORDER — GABAPENTIN 300 MG PO CAPS
900.0000 mg | ORAL_CAPSULE | Freq: Two times a day (BID) | ORAL | Status: DC
  Administered 2024-12-05 – 2024-12-10 (×9): 900 mg via ORAL
  Filled 2024-12-05 (×11): qty 3

## 2024-12-05 MED ORDER — FAMOTIDINE 20 MG PO TABS
40.0000 mg | ORAL_TABLET | Freq: Every day | ORAL | Status: DC
  Administered 2024-12-05 – 2024-12-09 (×5): 40 mg via ORAL
  Filled 2024-12-05 (×5): qty 2

## 2024-12-05 MED ORDER — ZOLPIDEM TARTRATE 5 MG PO TABS
5.0000 mg | ORAL_TABLET | Freq: Every evening | ORAL | Status: AC | PRN
  Administered 2024-12-05 – 2024-12-06 (×2): 5 mg via ORAL
  Filled 2024-12-05 (×2): qty 1

## 2024-12-05 MED ORDER — POTASSIUM CHLORIDE 2 MEQ/ML IV SOLN: INTRAVENOUS | Status: DC

## 2024-12-05 MED ORDER — KCL IN DEXTROSE-NACL 40-5-0.9 MEQ/L-%-% IV SOLN
INTRAVENOUS | Status: AC
  Filled 2024-12-05 (×3): qty 1000

## 2024-12-05 MED ORDER — AMLODIPINE BESYLATE 5 MG PO TABS
5.0000 mg | ORAL_TABLET | Freq: Every day | ORAL | Status: DC
  Administered 2024-12-05 – 2024-12-10 (×5): 5 mg via ORAL
  Filled 2024-12-05 (×6): qty 1

## 2024-12-05 MED ORDER — LORAZEPAM 1 MG PO TABS
1.0000 mg | ORAL_TABLET | Freq: Three times a day (TID) | ORAL | Status: DC | PRN
  Administered 2024-12-05 – 2024-12-09 (×12): 1 mg via ORAL
  Filled 2024-12-05 (×12): qty 1

## 2024-12-05 MED ORDER — PENICILLIN G POTASSIUM 20000000 UNITS IJ SOLR
18.0000 10*6.[IU] | INTRAVENOUS | Status: DC
  Administered 2024-12-05 – 2024-12-06 (×2): 18 10*6.[IU] via INTRAVENOUS
  Filled 2024-12-05 (×3): qty 18

## 2024-12-05 MED ORDER — ESCITALOPRAM OXALATE 20 MG PO TABS
20.0000 mg | ORAL_TABLET | Freq: Every morning | ORAL | Status: DC
  Administered 2024-12-05 – 2024-12-10 (×6): 20 mg via ORAL
  Filled 2024-12-05 (×3): qty 1
  Filled 2024-12-05: qty 2
  Filled 2024-12-05 (×2): qty 1

## 2024-12-05 MED ORDER — VANCOMYCIN HCL 125 MG PO CAPS
125.0000 mg | ORAL_CAPSULE | Freq: Four times a day (QID) | ORAL | Status: DC
  Administered 2024-12-05 – 2024-12-10 (×19): 125 mg via ORAL
  Filled 2024-12-05 (×19): qty 1

## 2024-12-05 MED ORDER — LOSARTAN POTASSIUM 50 MG PO TABS
25.0000 mg | ORAL_TABLET | Freq: Every day | ORAL | Status: DC
  Administered 2024-12-05 – 2024-12-10 (×6): 25 mg via ORAL
  Filled 2024-12-05 (×6): qty 1

## 2024-12-05 NOTE — Progress Notes (Signed)
 [facility]  Kehaulani Reichenberger FMW:[id number]  DOB: July 23, 1946  DOA: 12/04/2024  PCP: [name], MD  12/05/2024,7:16 AM  LOS: 1 day    Code Status: Full code     from: Home  79 year old white female high functioning Previous ORIF periprosthetic fracture 05/15/2023 with plate removal (Dr. Dozier) Remote history of GI bleed and peritonitis from ruptured gastric ulcer remotely several years ago Depression Fibromyalgia Rheumatoid arthritis follows with [facility] [name] on [facility] [address] Spondylolisthesis on pain meds oxycodone  Cymbalta Prediabetes   Developed right index finger pain middle of December  given doxycycline Did not improve Came to hospital second distal interphalangeal joint increased joint space periosteal reaction and thought to have infection cellulitis of the right index finger this patient who group A strep 12/30 underwent right index finger DIP joint incision and drainage was discharged home on linezolid  and instructed to follow-up with infectious disease for further instructions--followed up with Dr. Murrell of hand surgery on 1/13 ~continue hypertonic saline soaks dressing changes and to follow-up in 2 weeks with him, also saw [name] 1/16 linezolid  switched to 1000 3 times daily And followed up with primary care for right hip prosthetic pain  Around 1/25 developed significant nausea bloating abdominal pain and vomiting-lab workup revealed sodium 127 potassium 3.8 BUN/creatinine 0.7 WBC 12.2 platelet 364 UA negative CT abdomen pelvis severe diffuse colonic wall thickening edema throughout entire colon consistent with colitis?  C. difficile Blood culture X2 obtained  Because of chest pain we ordered a troponin   Assessment  & Plan :    Stool C. difficile colitis C. difficile panel/GI pathogen has not been collected as yet-Will continue at least 10 days overlap of Dificid  200 twice daily in addition to Flagyl  500 Q8 for overlapping coverage Of what appears to be C.  Difficile Hydrate appropriately and allow diet as able  Sinus tachycardia, chest pain Obtain troponin x 1 now-if feels more like pressure in her belly radiating upwards but given her sinus tach schedule appropriate to rule out other causes/issues-her EKG from admission was reviewed and does not show any ST-T wave changes  Infected long finger right hand on antibiotics Continues on penicillin  IV-ID to formally consult Continue gabapentin  900 3 times daily watch carefully  Hypovolemic hyponatremia Severe hypokalemia Replace potassium with saline 100 cc/H+ 40K, add magnesium lab and replace as needed Suspect will resolve rapidly  Rheumatoid arthritis [facility] follow-up as an outpatient regarding further management  Spondylolisthesis Increasing back pain Outpatient MRI has been reordered Follow-up  Prediabetes  Ruptured gastric ulcer in the past several years ago Would keep only on Pepcid  40-avoid PPIs  HTN Amlodipine  5, losartan  25 resumed-adjust blood pressure meds as able going forward  Depression Continue Lexapro  20 lorazepam  1 mg every 8 as needed Pamelor  75  Data Reviewed today: WBC 11.4 hemoglobin 10.2 platelet 343  DVT prophylaxis: Lovenox   Dispo/Global plan: Inpatient     Subjective:   Was asleep overnight and awakens quite readily at the bedside Feels uncomfortable in the stomach and bloated feels she has radiating pain from her mid abdomen up to her left chest which is constant in nature No current nausea Was not able to pass stool completely empty her toilet so no sample data She is tachycardic this looks  Objective + exam Vitals:   12/05/24 0030 12/05/24 0100 12/05/24 0258 12/05/24 0418  BP: (!) 140/81 (!) 141/84  124/85  Pulse:      Resp: 15 10  14   Temp:   99.6  F (37.6 C)   TempSrc:   Axillary   SpO2:   98% 99%  Weight:      Height:       Filed Weights   12/04/24 2309  Weight: 61.2 kg     Examination: EOMI NCAT no focal deficit no icterus  no pallor. She is a little bit frail-her left finger is wrapped on occlusive dressing which looks rubberized and protects it I did not examine it Abdomen is distended no rebound no guarding No lower extremity edema ROM is intact   Scheduled Meds:  amLODipine   5 mg Oral Daily   enoxaparin  (LOVENOX ) injection  40 mg Subcutaneous Q24H   escitalopram   20 mg Oral q AM   famotidine   40 mg Oral Daily   fidaxomicin   200 mg Oral BID   gabapentin   900 mg Oral BID   losartan   25 mg Oral Daily   nortriptyline   75 mg Oral QHS   pantoprazole  (PROTONIX ) IV  40 mg Intravenous Q24H   Continuous Infusions:  sodium chloride      sodium chloride  100 mL/hr at 12/04/24 2353   metronidazole  500 mg (12/05/24 0556)   pencillin G potassium IV Stopped (12/05/24 0316)   acetaminophen  **OR** acetaminophen , albuterol , LORazepam , ondansetron  **OR** ondansetron  (ZOFRAN ) IV  I spent 75 minutes today before, during and after this patient interview and examination--reviewing pertinent data, coordinating the patient's care and in communication with the care team and other medical professionals  Colen Grimes, MD  [facility]

## 2024-12-05 NOTE — ED Notes (Signed)
 Chat sent to pharmacy for pending Penicillin  dose.

## 2024-12-05 NOTE — ED Notes (Signed)
 Pt resting in bed.  No respiratory distress noted. Pt remains on continuous cardiac, pulse ox and bp monitoring device.  Denies pain, c/o anxiety. Requesting Ambien . Pt notified d/t time of morning, Ambien  not to be administered at this time.  Pt verbalizes understanding.  Pt continuing to eat ice.  Denies n/v at this time.  No incontinence of stool.  VS obtained and recorded.  Repeat EKG captured and printed per orders.

## 2024-12-05 NOTE — Telephone Encounter (Addendum)
 Patient Product/process Development Scientist completed.    The patient is insured through [facility]. Patient has Medicare and is not eligible for a copay card, but may be able to apply for patient assistance or Medicare RX Payment Plan (Patient Must reach out to their plan, if eligible for payment plan), if available.    Ran test claim for Dificid  200 mg and the current 10 day co-pay is $1,631.04.  Ran test claim for vancomycin  125 mg and the current 10 day co-pay is $29.49   This test claim was processed through [employer]- copay amounts may vary at other pharmacies due to [employer], or as the patient moves through the different stages of their insurance plan.     Reyes Sharps, CPHT Pharmacy Technician Patient Advocate Specialist Lead [facility] Patient Advocate Team Direct Number: [phone]  Fax: [phone]

## 2024-12-05 NOTE — Consult Note (Signed)
 "       Date of Admission:  12/04/2024          Reason for Consult: C. difficile colitis and patient being treated for right finger infection who had a recent group A streptococcal bacteremia    Referring Provider: Colen Grimes, MD   Assessment:  C. difficile colitis with pancolitis on scan Right first finger abscess complicated by GAS bacteremia and with concern for osteomyelitis Hx of right hip pain at site of her R prosthetic hip that is now resolved History of rheumatoid arthritis not on disease modifying medications Spondylolithiasis   Plan:  (Due to unavoidability  of dificid  as an outpatient for her we will switch her to vancomycin  Will DC Flagyl  Would continue penicillin  Will check sed rate and CRP though she does have comorbid rheumatoid arthritis I think we need Orthopedic hand surgery to assess her. She was supposed to see them but now has missed or will miss her appt due to her hospitalization Enteric precautions     HPI: Tanya Duncan is a 79 year old lady who developed a finger abscess complicated by group A streptococcal bacteremia.  She underwent I&D on the 30th for finger abscess with Dr. Murrell.  Apparently the infection tracked deep but not to bone.  There was concern though it seems on the part of our team that the patient may have had osteomyelitis.  She was treated with IV penicillin  while in the hospital then followed by Zyvox  with plans to switch to either high-dose cefadroxil or amoxicillin .  She saw [name] on 16 January and was complaining of nausea on Zyvox .  At that time she was also complaining of right hip pain where she has a prosthetic hip and is concerned that this site may have been seeded by group A streptococcus bacteremia MRI was going to be pursued due to her intolerance of the Zyvox  she was placed on high-dose amoxicillin .  However in the past week she began having severe diarrhea as well as nausea and vomiting was not  able to take her amoxicillin  anymore.  She thought that she only had a stomach virus and tried to tough it out.  She has been admitted with what is fulminant clostridium difficile.  CT scan showed Severe diffuse colonic wall thickening and edema throughout the entire colon, consistent with colitis, possibly pseudomembranous colitis. Other infectious or inflammatory etiologies could also have this appearance.  C difficile antigen and toxin +. She was started on difficid and flagyl  along with IV PCN to treat her finger   I personally spent a total of 82 minutes in the care of the patient today including preparing to see the patient, getting/reviewing separately obtained history, performing a medically appropriate exam/evaluation, counseling and educating, placing orders, referring and communicating with other health care professionals, documenting clinical information in the EHR, independently interpreting results, communicating results, and coordinating care.   Evaluation of the patient requires complex antimicrobial therapy evaluation, counseling , isolation needs to reduce disease transmission and risk assessment and mitigation.     Review of Systems: Review of Systems  Constitutional:  Positive for malaise/fatigue. Negative for chills, fever and weight loss.  HENT:  Negative for congestion and sore throat.   Eyes:  Negative for blurred vision and photophobia.  Respiratory:  Negative for cough, shortness of breath and wheezing.   Cardiovascular:  Negative for chest pain, palpitations and leg swelling.  Gastrointestinal:  Positive for abdominal pain, diarrhea, nausea and vomiting. Negative for blood in  stool, constipation, heartburn and melena.  Genitourinary:  Negative for dysuria, flank pain and hematuria.  Musculoskeletal:  Negative for back pain, falls, joint pain and myalgias.  Skin:  Negative for itching and rash.  Neurological:  Positive for dizziness and weakness. Negative for  focal weakness, loss of consciousness and headaches.  Endo/Heme/Allergies:  Does not bruise/bleed easily.  Psychiatric/Behavioral:  Negative for depression and suicidal ideas. The patient does not have insomnia.     Past Medical History:  Diagnosis Date   Anxiety    Arthritis    Blood transfusion without reported diagnosis 05/2019   with hip surgery   Complication of anesthesia    Fibromyalgia    Fractured pelvis (HCC) 09/15/2020   right   GERD (gastroesophageal reflux disease)    GI bleed 8111,8009   x2   Hyperlipidemia    Palpitations    Pneumonia 2005   PONV (postoperative nausea and vomiting)    Pre-diabetes    Stomach ulcer 1990    Social History[1]  Family History  Problem Relation Age of Onset   Colon cancer Father 87   Stomach cancer Paternal Uncle    Esophageal cancer Neg Hx    Rectal cancer Neg Hx    Allergies[2]  OBJECTIVE: Blood pressure 123/66, pulse 99, temperature 97.9 F (36.6 C), temperature source Oral, resp. rate 16, height 5' 2 (1.575 m), weight 61.2 kg, SpO2 100%.  Physical Exam Constitutional:      General: She is not in acute distress.    Appearance: Normal appearance. She is well-developed. She is not ill-appearing or diaphoretic.  HENT:     Head: Normocephalic and atraumatic.     Right Ear: Hearing and external ear normal.     Left Ear: Hearing and external ear normal.     Nose: No nasal deformity or rhinorrhea.  Eyes:     General: No scleral icterus.    Conjunctiva/sclera: Conjunctivae normal.     Right eye: Right conjunctiva is not injected.     Left eye: Left conjunctiva is not injected.     Pupils: Pupils are equal, round, and reactive to light.  Neck:     Vascular: No JVD.  Cardiovascular:     Rate and Rhythm: Normal rate and regular rhythm.     Heart sounds: S1 normal and S2 normal.  Pulmonary:     Effort: No respiratory distress.     Breath sounds: No wheezing.  Abdominal:     General: There is distension.      Palpations: Abdomen is soft.     Tenderness: There is abdominal tenderness.  Musculoskeletal:        General: Normal range of motion.     Right shoulder: Normal.     Left shoulder: Normal.     Cervical back: Normal range of motion and neck supple.     Right hip: Normal.     Left hip: Normal.     Right knee: Normal.     Left knee: Normal.  Lymphadenopathy:     Head:     Right side of head: No submandibular, preauricular or posterior auricular adenopathy.     Left side of head: No submandibular, preauricular or posterior auricular adenopathy.     Cervical: No cervical adenopathy.     Right cervical: No superficial or deep cervical adenopathy.    Left cervical: No superficial or deep cervical adenopathy.  Skin:    General: Skin is warm and dry.     Coloration: Skin is  not pale.     Findings: No abrasion, bruising, ecchymosis, erythema, lesion or rash.     Nails: There is no clubbing.  Neurological:     Mental Status: She is alert and oriented to person, place, and time.     Sensory: No sensory deficit.     Coordination: Coordination normal.     Gait: Gait normal.  Psychiatric:        Attention and Perception: She is attentive.        Mood and Affect: Mood normal.        Speech: Speech normal.        Behavior: Behavior normal. Behavior is cooperative.        Thought Content: Thought content normal.        Judgment: Judgment normal.    Finger 12/05/2024:    Lab Results Lab Results  Component Value Date   WBC 11.4 (H) 12/05/2024   HGB 10.2 (L) 12/05/2024   HCT 30.9 (L) 12/05/2024   MCV 87.3 12/05/2024   PLT 343 12/05/2024    Lab Results  Component Value Date   CREATININE 0.47 12/05/2024   BUN 18 12/05/2024   NA 130 (L) 12/05/2024   K 2.8 (L) 12/05/2024   CL 97 (L) 12/05/2024   CO2 20 (L) 12/05/2024    Lab Results  Component Value Date   ALT 23 12/04/2024   AST 23 12/04/2024   ALKPHOS 194 (H) 12/04/2024   BILITOT 0.6 12/04/2024     Microbiology: Recent  Results (from the past 240 hours)  C Difficile Quick Screen w PCR reflex     Status: Abnormal   Collection Time: 12/05/24  9:00 AM   Specimen: Stool  Result Value Ref Range Status   C Diff antigen POSITIVE (A) NEGATIVE Final   C Diff toxin POSITIVE (A) NEGATIVE Final   C Diff interpretation Toxin producing C. difficile detected.  Final    Comment: CRITICAL RESULT CALLED TO, READ BACK BY AND VERIFIED WITH: ALENE SAILOR, RN ON 12/05/24 @ 1106 BY LORELI, K  Performed at [facility], [address] [place], [place] [address]     Jomarie Fleeta Rothman, MD [facility] for Infectious [facility] [phone] pager  12/05/2024, 5:46 PM      [1]  Social History Tobacco Use   Smoking status: Never   Smokeless tobacco: Never  Vaping Use   Vaping status: Never Used  Substance Use Topics   Alcohol use: Not Currently    Comment: rare   Drug use: Never  [2]  Allergies Allergen Reactions   Atorvastatin Diarrhea   Bupropion Nausea And Vomiting   Duloxetine Hcl Nausea And Vomiting   Ibuprofen Other (See Comments)    Has had GI bleeds x2 in past   Sage [Salvia Officinalis] Palpitations and Other (See Comments)    Heart races, flu-like symptoms    Sulfa Antibiotics Nausea And Vomiting   Avelox [Moxifloxacin Hcl In Nacl] Palpitations   Indocin [Indomethacin] Other (See Comments)    Dizziness   "

## 2024-12-05 NOTE — ED Notes (Signed)
 ED TO INPATIENT HANDOFF REPORT  Name/Age/Gender Tanya Duncan 79 y.o. female  Code Status    Code Status Orders  (From admission, onward)           Start     Ordered   12/04/24 1725  Full code  Continuous       Question:  By:  Answer:  Consent: discussion documented in EHR   12/04/24 1725           Code Status History     Date Active Date Inactive Code Status Order ID Comments User Context   11/04/2024 1551 11/07/2024 2217 Full Code [id number]  Tobie Mario GAILS, MD Inpatient   05/15/2023 1755 05/16/2023 2219 Full Code [id number]  Brendan Gauze, PA-C Inpatient   09/21/2020 0923 09/21/2020 1504 Full Code [id number]  Sebastian Lenis, MD Inpatient   05/17/2019 2314 05/18/2019 1930 Full Code [id number]  Tobie Jorie SAUNDERS, MD ED   05/14/2019 1327 05/16/2019 2036 Full Code [id number]  Lenis Prentice RIGGERS Inpatient       Home/SNF/Other Home  Chief Complaint Pancolitis [K52.9]  Level of Care/Admitting Diagnosis ED Disposition     ED Disposition  Admit   Condition  --   Comment  Hospital Area: [facility] [[id number]]  Level of Care: Med-Surg [16]  May admit patient to [facility] or [facility] if equivalent level of care is available:: Yes  Diagnosis: Pancolitis [222953]  Admitting Physician: ZELLA KATHA HERO [[id number]]  Attending Physician: ZELLA, MIR [place] [[id number]]  Certification:: I certify this patient will need inpatient services for at least 2 midnights  Expected Medical Readiness: 12/07/2024          Medical History Past Medical History:  Diagnosis Date   Anxiety    Arthritis    Blood transfusion without reported diagnosis 05/2019   with hip surgery   Complication of anesthesia    Fibromyalgia    Fractured pelvis (HCC) 09/15/2020   right   GERD (gastroesophageal reflux disease)    GI bleed 8111,8009   x2   Hyperlipidemia    Palpitations    Pneumonia 2005   PONV (postoperative nausea and vomiting)    Pre-diabetes    Stomach ulcer 1990     Allergies Allergies[1]  IV Location/Drains/Wounds Patient Lines/Drains/Airways Status     Active Line/Drains/Airways     Name Placement date Placement time Site Days   Peripheral IV 12/04/24 20 G Anterior;Proximal;Right Forearm 12/04/24  1413  Forearm  1   Wound Finger (Comment which one) Posterior;Right --  --  Finger (Comment which one)  --   Wound 11/05/24 1633 Surgical Closed Surgical Incision Hand Right 11/05/24  1633  Hand  30            Labs/Imaging Results for orders placed or performed during the hospital encounter of 12/04/24 (from the past 48 hours)  Urinalysis, Routine w reflex microscopic -Urine, Clean Catch     Status: Abnormal   Collection Time: 12/04/24  2:22 PM  Result Value Ref Range   Color, Urine YELLOW YELLOW   APPearance HAZY (A) CLEAR   Specific Gravity, Urine 1.020 1.005 - 1.030   pH 6.0 5.0 - 8.0   Glucose, UA NEGATIVE NEGATIVE mg/dL   Hgb urine dipstick NEGATIVE NEGATIVE   Bilirubin Urine NEGATIVE NEGATIVE   Ketones, ur 5 (A) NEGATIVE mg/dL   Protein, ur 30 (A) NEGATIVE mg/dL   Nitrite NEGATIVE NEGATIVE   Leukocytes,Ua NEGATIVE NEGATIVE   RBC / HPF 0-5 0 - 5 RBC/hpf  WBC, UA 0-5 0 - 5 WBC/hpf   Bacteria, UA RARE (A) NONE SEEN   Squamous Epithelial / HPF 0-5 0 - 5 /HPF   Mucus PRESENT    Hyaline Casts, UA PRESENT     Comment: Performed at [facility], [address] [place], [place] [address]  I-Stat CG4 Lactic Acid     Status: None   Collection Time: 12/04/24  2:22 PM  Result Value Ref Range   Lactic Acid, Venous 1.2 0.5 - 1.9 mmol/L  CBC     Status: Abnormal   Collection Time: 12/04/24  2:45 PM  Result Value Ref Range   WBC 12.2 (H) 4.0 - 10.5 K/uL   RBC 4.19 3.87 - 5.11 MIL/uL   Hemoglobin 12.3 12.0 - 15.0 g/dL   HCT 64.6 (L) 63.9 - 53.9 %   MCV 84.2 80.0 - 100.0 fL   MCH 29.4 26.0 - 34.0 pg   MCHC 34.8 30.0 - 36.0 g/dL   RDW 86.2 88.4 - 84.4 %   Platelets 364 150 - 400 K/uL   nRBC 0.0 0.0 - 0.2 %     Comment: Performed at [facility], [address], [place], [place] [address]  Comprehensive metabolic panel     Status: Abnormal   Collection Time: 12/04/24  2:45 PM  Result Value Ref Range   Sodium 127 (L) 135 - 145 mmol/L   Potassium 3.9 3.5 - 5.1 mmol/L   Chloride 89 (L) 98 - 111 mmol/L   CO2 26 22 - 32 mmol/L   Glucose, Bld 125 (H) 70 - 99 mg/dL    Comment: Glucose reference range applies only to samples taken after fasting for at least 8 hours.   BUN 31 (H) 8 - 23 mg/dL   Creatinine, Ser 9.26 0.44 - 1.00 mg/dL   Calcium  10.0 8.9 - 10.3 mg/dL   Total Protein 6.3 (L) 6.5 - 8.1 g/dL   Albumin 3.6 3.5 - 5.0 g/dL   AST 23 15 - 41 U/L    Comment: HEMOLYSIS AT THIS LEVEL MAY AFFECT RESULT   ALT 23 0 - 44 U/L   Alkaline Phosphatase 194 (H) 38 - 126 U/L   Total Bilirubin 0.6 0.0 - 1.2 mg/dL   GFR, Estimated >39 >39 mL/min    Comment: (NOTE) Calculated using the CKD-EPI Creatinine Equation (2021)    Anion gap 12 5 - 15    Comment: Performed at [facility], [address], [place], [place] [address]  Lipase, blood     Status: None   Collection Time: 12/04/24  2:45 PM  Result Value Ref Range   Lipase 13 11 - 51 U/L    Comment: Performed at [facility], [address] [place], [place] [address]   CT ABDOMEN PELVIS W CONTRAST Result Date: 12/04/2024 EXAM: CT ABDOMEN AND PELVIS WITH CONTRAST 12/04/2024 03:56:35 PM TECHNIQUE: CT of the abdomen and pelvis was performed with the administration of 100 mL of iohexol  (OMNIPAQUE ) 300 MG/ML solution. Multiplanar reformatted images are provided for review. Automated exposure control, iterative reconstruction, and/or weight-based adjustment of the mA/kV was utilized to reduce the radiation dose to as low as reasonably achievable. COMPARISON: 10/26/2022 CLINICAL HISTORY: Abdominal pain, acute, nonlocalized. Diarrhea, nausea, fever, and chills since Saturday. FINDINGS: LOWER CHEST: Mild  atelectasis or infiltration in the right lung base. LIVER: The liver is unremarkable. GALLBLADDER AND BILE DUCTS: Gallbladder is unremarkable. No biliary ductal dilatation. SPLEEN: No acute abnormality. PANCREAS: No  acute abnormality. ADRENAL GLANDS: No acute abnormality. KIDNEYS, URETERS AND BLADDER: No stones in the kidneys or ureters. No hydronephrosis. No perinephric or periureteral stranding. The visualized bladder is unremarkable. Visualization of the bladder and pelvic contents is limited due to streak artifact from bilateral hip arthroplasties. GI AND BOWEL: Surgical clips at the eg junction. There is severe diffuse colonic wall thickening and edema throughout the entire colon, changes are consistent with colitis, possibly pseudomembranous colitis, although other infectious or inflammatory etiologies could also have this appearance. Small bowel and stomach are mostly decompressed. There is no bowel obstruction. PERITONEUM AND RETROPERITONEUM: No ascites. No free air. No loculated collections. Pelvic organs, if present, are not visualized due to streak artifact. VASCULATURE: Aorta is normal in caliber. Calcification of the aorta. No aneurysm. LYMPH NODES: No lymphadenopathy. REPRODUCTIVE ORGANS: Pelvic organs, if present, are not visualized due to streak artifact. BONES AND SOFT TISSUES: Multiple old bilateral rib fractures. 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 Posterior laminectomies with posterior rod and screw fixation of the lower lumbar spine. Metallic foreign body demonstrated in the subcutaneous soft tissues over the left side of the back. Bilateral hip arthroplasties are present. IMPRESSION: 1. Severe diffuse colonic wall thickening and edema throughout the entire colon, consistent with colitis, possibly pseudomembranous colitis. Other infectious or inflammatory etiologies could also have this appearance. 2. No free air or free fluid. No loculated collections.  Electronically signed by: Elsie Gravely MD 12/04/2024 04:30 PM EST RP Workstation: [id number]    Pending Labs Unresulted Labs (From admission, onward)     Start     Ordered   12/05/24 0500  Basic metabolic panel  Tomorrow morning,   R        12/04/24 1725   12/05/24 0500  CBC  Tomorrow morning,   R        12/04/24 1725   12/04/24 1740  Culture, blood (Routine X 2) w Reflex to ID Panel  BLOOD CULTURE X 2,   R (with TIMED occurrences)      12/04/24 1739   12/04/24 1323  Gastrointestinal Panel by PCR , Stool  (Gastrointestinal Panel by PCR, Stool                                                                                                                                                     **Does Not include CLOSTRIDIUM DIFFICILE testing. **If CDIFF testing is needed, place order from the C Difficile Testing order set.**)  Once,   URGENT        12/04/24 1323   12/04/24 1323  C Difficile Quick Screen w PCR reflex  (C Difficile quick screen w PCR reflex panel )  Once, for 24 hours,   URGENT       References:    CDiff Information Tool  12/04/24 1323            Vitals/Pain Today's Vitals   12/05/24 0030 12/05/24 0100 12/05/24 0148 12/05/24 0258  BP: (!) 140/81 (!) 141/84    Pulse:      Resp: 15 10    Temp:    99.6 F (37.6 C)  TempSrc:    Axillary  SpO2:    98%  Weight:      Height:      PainSc:   4  4     Isolation Precautions Enteric precautions (UV disinfection)  Medications Medications  fidaxomicin  (DIFICID ) tablet 200 mg (200 mg Oral Given 12/04/24 2238)  metroNIDAZOLE  (FLAGYL ) IVPB 500 mg (500 mg Intravenous New Bag/Given 12/04/24 2241)  pantoprazole  (PROTONIX ) injection 40 mg (40 mg Intravenous Given 12/04/24 2239)  enoxaparin  (LOVENOX ) injection 40 mg (40 mg Subcutaneous Given 12/04/24 2242)  acetaminophen  (TYLENOL ) tablet 650 mg (has no administration in time range)    Or  acetaminophen  (TYLENOL ) suppository 650 mg (has no administration in time range)   albuterol  (PROVENTIL ) (2.5 MG/3ML) 0.083% nebulizer solution 2.5 mg (has no administration in time range)  ondansetron  (ZOFRAN ) tablet 4 mg ( Oral See Alternative 12/04/24 2239)    Or  ondansetron  (ZOFRAN ) injection 4 mg (4 mg Intravenous Given 12/04/24 2239)  nortriptyline  (PAMELOR ) capsule 75 mg (75 mg Oral Not Given 12/04/24 2239)  0.9 %  sodium chloride  infusion ( Intravenous New Bag/Given 12/04/24 2353)  penicillin  G potassium 3 Million Units in dextrose  5 % 100 mL IVPB (3 Million Units Intravenous New Bag/Given 12/05/24 0246)  amLODipine  (NORVASC ) tablet 5 mg (has no administration in time range)  escitalopram  (LEXAPRO ) tablet 20 mg (has no administration in time range)  LORazepam  (ATIVAN ) tablet 1 mg (1 mg Oral Given 12/05/24 0246)  losartan  (COZAAR ) tablet 25 mg (has no administration in time range)  gabapentin  (NEURONTIN ) capsule 900 mg (900 mg Oral Given 12/05/24 0246)  ondansetron  (ZOFRAN ) injection 4 mg (4 mg Intravenous Given 12/04/24 1413)  sodium chloride  0.9 % bolus 1,000 mL (0 mLs Intravenous Stopped 12/04/24 1900)  iohexol  (OMNIPAQUE ) 300 MG/ML solution 100 mL (100 mLs Intravenous Contrast Given 12/04/24 1546)    Mobility non-ambulatory     [1]  Allergies Allergen Reactions   Atorvastatin Diarrhea   Bupropion Nausea And Vomiting   Duloxetine Hcl Nausea And Vomiting   Ibuprofen Other (See Comments)    Has had GI bleeds x2 in past   Sage [Salvia Officinalis] Palpitations and Other (See Comments)    Heart races, flu-like symptoms    Sulfa Antibiotics Nausea And Vomiting   Avelox [Moxifloxacin Hcl In Nacl] Palpitations   Indocin [Indomethacin] Other (See Comments)    Dizziness

## 2024-12-05 NOTE — ED Notes (Signed)
 900 mg gabapentin  wasted with Ole PEAK.

## 2024-12-05 NOTE — Plan of Care (Signed)
  Problem: Activity: Goal: Risk for activity intolerance will decrease Outcome: Progressing   Problem: Nutrition: Goal: Adequate nutrition will be maintained Outcome: Progressing   Problem: Coping: Goal: Level of anxiety will decrease Outcome: Progressing   Problem: Pain Managment: Goal: General experience of comfort will improve and/or be controlled Outcome: Progressing   Problem: Safety: Goal: Ability to remain free from injury will improve Outcome: Progressing

## 2024-12-06 ENCOUNTER — Ambulatory Visit: Payer: Self-pay | Admitting: Gastroenterology

## 2024-12-06 DIAGNOSIS — B955 Unspecified streptococcus as the cause of diseases classified elsewhere: Secondary | ICD-10-CM | POA: Diagnosis not present

## 2024-12-06 DIAGNOSIS — A0472 Enterocolitis due to Clostridium difficile, not specified as recurrent: Secondary | ICD-10-CM | POA: Diagnosis not present

## 2024-12-06 DIAGNOSIS — K529 Noninfective gastroenteritis and colitis, unspecified: Secondary | ICD-10-CM | POA: Diagnosis not present

## 2024-12-06 DIAGNOSIS — L02511 Cutaneous abscess of right hand: Secondary | ICD-10-CM | POA: Diagnosis not present

## 2024-12-06 DIAGNOSIS — R7881 Bacteremia: Secondary | ICD-10-CM | POA: Diagnosis not present

## 2024-12-06 LAB — BASIC METABOLIC PANEL WITH GFR
Anion gap: 8 (ref 5–15)
BUN: 7 mg/dL — ABNORMAL LOW (ref 8–23)
CO2: 20 mmol/L — ABNORMAL LOW (ref 22–32)
Calcium: 7.9 mg/dL — ABNORMAL LOW (ref 8.9–10.3)
Chloride: 102 mmol/L (ref 98–111)
Creatinine, Ser: 0.45 mg/dL (ref 0.44–1.00)
GFR, Estimated: >60 mL/min
Glucose, Bld: 137 mg/dL — ABNORMAL HIGH (ref 70–99)
Potassium: 3.8 mmol/L (ref 3.5–5.1)
Sodium: 130 mmol/L — ABNORMAL LOW (ref 135–145)

## 2024-12-06 LAB — CBC WITH DIFFERENTIAL/PLATELET
Abs Immature Granulocytes: 0.5 10*3/uL — ABNORMAL HIGH (ref 0.00–0.07)
Basophils Absolute: 0 10*3/uL (ref 0.0–0.1)
Basophils Relative: 0 %
Eosinophils Absolute: 0.2 10*3/uL (ref 0.0–0.5)
Eosinophils Relative: 3 %
HCT: 29 % — ABNORMAL LOW (ref 36.0–46.0)
Hemoglobin: 9.5 g/dL — ABNORMAL LOW (ref 12.0–15.0)
Immature Granulocytes: 6 %
Lymphocytes Relative: 19 %
Lymphs Abs: 1.5 10*3/uL (ref 0.7–4.0)
MCH: 28.5 pg (ref 26.0–34.0)
MCHC: 32.8 g/dL (ref 30.0–36.0)
MCV: 87.1 fL (ref 80.0–100.0)
Monocytes Absolute: 1 10*3/uL (ref 0.1–1.0)
Monocytes Relative: 12 %
Neutro Abs: 4.8 10*3/uL (ref 1.7–7.7)
Neutrophils Relative %: 60 %
Platelets: 363 10*3/uL (ref 150–400)
RBC: 3.33 MIL/uL — ABNORMAL LOW (ref 3.87–5.11)
RDW: 13.9 % (ref 11.5–15.5)
Smear Review: NORMAL
WBC: 8.1 10*3/uL (ref 4.0–10.5)
nRBC: 0 % (ref 0.0–0.2)

## 2024-12-06 LAB — GASTROINTESTINAL PANEL BY PCR, STOOL (REPLACES STOOL CULTURE)

## 2024-12-06 LAB — C-REACTIVE PROTEIN: CRP: 9.4 mg/dL — ABNORMAL HIGH

## 2024-12-06 LAB — MAGNESIUM: Magnesium: 1.8 mg/dL (ref 1.7–2.4)

## 2024-12-06 LAB — SEDIMENTATION RATE: Sed Rate: 35 mm/h — ABNORMAL HIGH (ref 0–22)

## 2024-12-06 MED ORDER — CARMEX CLASSIC LIP BALM EX OINT
1.0000 | TOPICAL_OINTMENT | CUTANEOUS | Status: DC | PRN
  Filled 2024-12-06: qty 10

## 2024-12-06 NOTE — Plan of Care (Signed)

## 2024-12-06 NOTE — Progress Notes (Signed)
 [facility]  Tanya Duncan FMW:[id number]  DOB: [birth year]  DOA: 12/04/2024  PCP: Tanya Lenis, MD  12/06/2024,8:21 AM  LOS: 2 days    Code Status: Full code     from: Home  79 year old white female high functioning Previous ORIF periprosthetic fracture 05/15/2023 with plate removal (Tanya Duncan) Remote history of GI bleed and peritonitis from ruptured gastric ulcer remotely several years ago Depression Fibromyalgia Rheumatoid arthritis follows with [facility] Tanya Duncan on [facility] [address] Spondylolisthesis on pain meds oxycodone  Cymbalta Prediabetes   Developed right index finger pain middle of December  given doxycycline Did not improve Came to hospital second distal interphalangeal joint increased joint space periosteal reaction and thought to have infection cellulitis of the right index finger this patient who group A strep 12/30 underwent right index finger DIP joint incision and drainage was discharged home on linezolid  and instructed to follow-up with infectious disease for further instructions--followed up with Tanya Duncan of hand surgery on 1/13 ~continue hypertonic saline soaks dressing changes and to follow-up in 2 weeks with him, also saw Tanya Duncan 1/16 linezolid  switched to 1000 3 times daily And followed up with primary care for right hip prosthetic pain  Around 1/25 developed significant nausea bloating abdominal pain and vomiting-lab workup revealed sodium 127 potassium 3.8 BUN/creatinine 0.7 WBC 12.2 platelet 364 UA negative CT abdomen pelvis severe diffuse colonic wall thickening edema throughout entire colon consistent with colitis?  C. difficile Blood culture X2 obtained  Because of chest pain we ordered a troponin   Assessment  & Plan :     Stool C. 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 Continues on penicillin  IV--previously was on linezolid  and then switched to Amoxil  high-dose 1000 3 times daily to complete 6 weeks of therapy ending likely around 12/20/2024 Continue gabapentin  900 3 times daily pain seems controlled I will alert Tanya Duncan as patient is here--- and patient might miss follow-up appointment  Hypovolemic hyponatremia Severe hypokalemia Replace potassium with saline 100 cc/H+ 40K, add magnesium lab and replace as needed Suspect will resolve rapidly  Rheumatoid arthritis [facility] follow-up as an outpatient regarding further management  Spondylolisthesis Increasing back pain in the setting of chronic back pain with Tanya Duncan spinal stimulator Outpatient MRI was scheduled but could not be performed because of patient having MRI incompatible stimulator in place? Follow-up as an outpatient-I do not think this has a bearing on the patient C. difficile and will need to be scheduled as per her usual orthopedic She should get up out of bed today ambulate with nursing and see her steadiness-if she needs formal therapy we can order  Prediabetes  Ruptured gastric ulcer in the past several years ago only on Pepcid  40-avoid PPIs given risk of worsening the diarrhea  HTN Amlodipine  5, losartan  25 resumed-adjust blood pressure meds as able going forward  Depression Continue Lexapro  20 lorazepam  1 mg every 8 as needed Pamelor  75  Data Reviewed today: Labs are pending today  DVT prophylaxis: Lovenox   Dispo/Global plan: Inpatient     Subjective:   Overall  looks better feels fair no distress eating drinking She is only on clears and feels like she can eat  solids She has had about 4 stools since 6 PM yesterday She has no fever chills or rigor currently No vomiting  Objective + exam Vitals:   12/05/24 1510 12/05/24 1943 12/05/24 2322 12/06/24 0529  BP: 123/66 123/67 107/68 125/77  Pulse: 99 98  97  Resp: 16 16 16 16   Temp: 97.9 F (36.6 C) 98.2 F (36.8 C) 97.9 F (36.6 C) 98.2 F (36.8 C)  TempSrc: Oral Oral Oral Oral  SpO2: 100% 100% 96% 100%  Weight:      Height:       Filed Weights   12/04/24 2309  Weight: 61.2 kg     Examination: EOMI NCAT no focal deficit no icterus no pallor Chest is clear no wheeze S1-S2 no murmur no rub no gallop Abdomen soft Finger exam as below looks like it is healing well      Scheduled Meds:  amLODipine   5 mg Oral Daily   enoxaparin  (LOVENOX ) injection  40 mg Subcutaneous Q24H   escitalopram   20 mg Oral q AM   famotidine   40 mg Oral Daily   gabapentin   900 mg Oral BID   losartan   25 mg Oral Daily   nortriptyline   75 mg Oral QHS   pantoprazole  (PROTONIX ) IV  40 mg Intravenous Q24H   vancomycin   125 mg Oral QID   Continuous Infusions:  penicillin  G potassium 18 Million Units in dextrose  5 % 500 mL CONTINUOUS infusion 18 Million Units (12/05/24 1710)   acetaminophen  **OR** acetaminophen , albuterol , LORazepam , ondansetron  **OR** ondansetron  (ZOFRAN ) IV, zolpidem   I spent 35 minutes today before, during and after this patient interview and examination--reviewing pertinent data, coordinating the patient's care and in communication with the care team and other medical professionals  Colen Grimes, MD  [facility]

## 2024-12-06 NOTE — Progress Notes (Signed)
" °   12/06/24 1609  TOC Brief Assessment  Insurance and Status Reviewed  Patient has primary care physician Yes Carlyn, David, MD)  Home environment has been reviewed From home  Prior level of function: Independent  Prior/Current Home Services No current home services  Social Drivers of Health Review SDOH reviewed no interventions necessary  Readmission risk has been reviewed Yes  Transition of care needs no transition of care needs at this time    "

## 2024-12-06 NOTE — Plan of Care (Signed)
   Problem: Activity: Goal: Risk for activity intolerance will decrease Outcome: Progressing   Problem: Coping: Goal: Level of anxiety will decrease Outcome: Progressing   Problem: Pain Managment: Goal: General experience of comfort will improve and/or be controlled Outcome: Progressing   Problem: Skin Integrity: Goal: Risk for impaired skin integrity will decrease Outcome: Progressing

## 2024-12-06 NOTE — Plan of Care (Signed)
  Problem: Activity: Goal: Risk for activity intolerance will decrease Outcome: Progressing   Problem: Nutrition: Goal: Adequate nutrition will be maintained Outcome: Progressing   Problem: Coping: Goal: Level of anxiety will decrease Outcome: Progressing   Problem: Pain Managment: Goal: General experience of comfort will improve and/or be controlled Outcome: Progressing   Problem: Safety: Goal: Ability to remain free from injury will improve Outcome: Progressing

## 2024-12-06 NOTE — Progress Notes (Addendum)
 "       Subjective:  Patient had less loose bowel movements into the evening last night but then began having more frequent bowel movements this morning   Antibiotics:  Anti-infectives (From admission, onward)    Start     Dose/Rate Route Frequency Ordered Stop   12/05/24 1800  vancomycin  (VANCOCIN ) capsule 125 mg        125 mg Oral 4 times daily 12/05/24 1412 12/15/24 1759   12/05/24 1500  penicillin  G potassium 18 Million Units in dextrose  5 % 500 mL CONTINUOUS infusion        18 Million Units 20.8 mL/hr over 24 Hours Intravenous Every 24 hours 12/05/24 0744     12/04/24 2200  fidaxomicin  (DIFICID ) tablet 200 mg  Status:  Discontinued        200 mg Oral 2 times daily 12/04/24 1703 12/05/24 1412   12/04/24 1900  penicillin  G potassium 3 Million Units in dextrose  50mL IVPB  Status:  Discontinued        3 Million Units 100 mL/hr over 30 Minutes Intravenous Every 4 hours 12/04/24 1736 12/04/24 1752   12/04/24 1900  penicillin  G potassium 3 Million Units in dextrose  5 % 100 mL IVPB        3 Million Units 200 mL/hr over 30 Minutes Intravenous Every 4 hours 12/04/24 1752 12/05/24 1400   12/04/24 1830  metroNIDAZOLE  (FLAGYL ) IVPB 500 mg  Status:  Discontinued        500 mg 100 mL/hr over 60 Minutes Intravenous Every 8 hours 12/04/24 1720 12/05/24 1555       Medications: Scheduled Meds:  amLODipine   5 mg Oral Daily   enoxaparin  (LOVENOX ) injection  40 mg Subcutaneous Q24H   escitalopram   20 mg Oral q AM   famotidine   40 mg Oral Daily   gabapentin   900 mg Oral BID   losartan   25 mg Oral Daily   nortriptyline   75 mg Oral QHS   vancomycin   125 mg Oral QID   Continuous Infusions:  penicillin  G potassium 18 Million Units in dextrose  5 % 500 mL CONTINUOUS infusion 18 Million Units (12/05/24 1710)   PRN Meds:.acetaminophen  **OR** acetaminophen , albuterol , LORazepam , ondansetron  **OR** ondansetron  (ZOFRAN ) IV, zolpidem     Objective: Weight change:   Intake/Output Summary  (Last 24 hours) at 12/06/2024 1641 Last data filed at 12/06/2024 1000 Gross per 24 hour  Intake 2540.06 ml  Output --  Net 2540.06 ml   Blood pressure 133/87, pulse (!) 109, temperature 98.1 F (36.7 C), temperature source Oral, resp. rate 20, height 5' 2 (1.575 m), weight 61.2 kg, SpO2 96%. Temp:  [97.9 F (36.6 C)-98.2 F (36.8 C)] 98.1 F (36.7 C) (01/30 1344) Pulse Rate:  [97-109] 109 (01/30 1344) Resp:  [16-20] 20 (01/30 1344) BP: (107-140)/(67-87) 133/87 (01/30 1344) SpO2:  [96 %-100 %] 96 % (01/30 1344)  Physical Exam: Physical Exam Constitutional:      General: She is not in acute distress.    Appearance: She is well-developed. She is not diaphoretic.  HENT:     Head: Normocephalic and atraumatic.     Right Ear: External ear normal.     Left Ear: External ear normal.     Mouth/Throat:     Pharynx: No oropharyngeal exudate.  Eyes:     General: No scleral icterus.    Conjunctiva/sclera: Conjunctivae normal.     Pupils: Pupils are equal, round, and reactive to light.  Cardiovascular:     Rate and Rhythm:  Normal rate and regular rhythm.  Pulmonary:     Effort: Pulmonary effort is normal. No respiratory distress.     Breath sounds: No wheezing.  Abdominal:     General: There is no distension.     Palpations: Abdomen is soft.     Tenderness: There is abdominal tenderness.  Musculoskeletal:        General: No tenderness. Normal range of motion.  Lymphadenopathy:     Cervical: No cervical adenopathy.  Skin:    General: Skin is warm and dry.     Coloration: Skin is not pale.     Findings: No erythema or rash.  Neurological:     General: No focal deficit present.     Mental Status: She is alert and oriented to person, place, and time.     Motor: No abnormal muscle tone.     Coordination: Coordination normal.  Psychiatric:        Mood and Affect: Mood normal.        Behavior: Behavior normal.        Thought Content: Thought content normal.        Judgment:  Judgment normal.      CBC:    BMET Recent Labs    12/05/24 0600 12/06/24 0752  NA 130* 130*  K 2.8* 3.8  CL 97* 102  CO2 20* 20*  GLUCOSE 113* 137*  BUN 18 7*  CREATININE 0.47 0.45  CALCIUM  7.6* 7.9*     Liver Panel  Recent Labs    12/04/24 1445  PROT 6.3*  ALBUMIN 3.6  AST 23  ALT 23  ALKPHOS 194*  BILITOT 0.6       Sedimentation Rate Recent Labs    12/06/24 0752  ESRSEDRATE 35*   C-Reactive Protein Recent Labs    12/06/24 0752  CRP 9.4*    Micro Results: Recent Results (from the past 720 hours)  Culture, blood (Routine X 2) w Reflex to ID Panel     Status: None (Preliminary result)   Collection Time: 12/04/24  8:20 PM   Specimen: BLOOD LEFT WRIST  Result Value Ref Range Status   Specimen Description   Final    BLOOD LEFT WRIST Performed at [facility], [address] [place], [place] [address]    Special Requests   Final    BOTTLES DRAWN AEROBIC AND ANAEROBIC Blood Culture results may not be optimal due to an inadequate volume of blood received in culture bottles Performed at [facility], [address], [place], [place] [address]    Culture   Final    NO GROWTH 1 DAY Performed at [facility], [address], [place], [place] [address]    Report Status PENDING  Incomplete  Culture, blood (Routine X 2) w Reflex to ID Panel     Status: None (Preliminary result)   Collection Time: 12/04/24  8:40 PM   Specimen: BLOOD RIGHT WRIST  Result Value Ref Range Status   Specimen Description   Final    BLOOD RIGHT WRIST Performed at [facility], [address], [place], [place] [address]    Special Requests   Final    BOTTLES DRAWN AEROBIC AND ANAEROBIC Blood Culture results may not be optimal due to an inadequate volume of blood received in culture bottles Performed at [facility], [address], [place], [place] [address]    Culture   Final    NO GROWTH 1  DAY Performed at [facility], [address], [place], [place] [address]    Report Status PENDING  Incomplete  Gastrointestinal Panel by PCR , Stool     Status: None   Collection Time: 12/05/24  9:00 AM   Specimen: Stool  Result Value Ref Range Status   Campylobacter species NOT DETECTED NOT DETECTED Final   Plesimonas shigelloides NOT DETECTED NOT DETECTED Final   Salmonella species NOT DETECTED NOT DETECTED Final   Yersinia enterocolitica NOT DETECTED NOT DETECTED Final   Vibrio species NOT DETECTED NOT DETECTED Final   Vibrio cholerae NOT DETECTED NOT DETECTED Final   Enteroaggregative E coli (EAEC) NOT DETECTED NOT DETECTED Final   Enteropathogenic E coli (EPEC) NOT DETECTED NOT DETECTED Final   Enterotoxigenic E coli (ETEC) NOT DETECTED NOT DETECTED Final   Shiga like toxin producing E coli (STEC) NOT DETECTED NOT DETECTED Final   Shigella/Enteroinvasive E coli (EIEC) NOT DETECTED NOT DETECTED Final   Cryptosporidium NOT DETECTED NOT DETECTED Final   Cyclospora cayetanensis NOT DETECTED NOT DETECTED Final   Entamoeba histolytica NOT DETECTED NOT DETECTED Final   Giardia lamblia NOT DETECTED NOT DETECTED Final   Adenovirus F40/41 NOT DETECTED NOT DETECTED Final   Astrovirus NOT DETECTED NOT DETECTED Final   Norovirus GI/GII NOT DETECTED NOT DETECTED Final   Rotavirus A NOT DETECTED NOT DETECTED Final   Sapovirus (I, II, IV, and V) NOT DETECTED NOT DETECTED Final    Comment: Performed at [facility], [address] [place], [place] [address]  C Difficile Quick Screen w PCR reflex     Status: Abnormal   Collection Time: 12/05/24  9:00 AM   Specimen: Stool  Result Value Ref Range Status   C Diff antigen POSITIVE (A) NEGATIVE Final   C Diff toxin POSITIVE (A) NEGATIVE Final   C Diff interpretation Toxin producing C. difficile detected.  Final    Comment: CRITICAL RESULT CALLED TO, READ BACK BY AND VERIFIED WITH: ALENE SAILOR, RN ON 12/05/24 @ 1106 BY LORELI, K   Performed at [facility], [address], [place], [place] [address]     Studies/Results: No results found.    Assessment/Plan:  INTERVAL HISTORY: after we saw the patient this am patient seen by Franky Curia with orthopedics   Principal Problem:   Clostridium difficile colitis Active Problems:   Pancolitis   Sepsis (HCC)   Chronic osteomyelitis of right hand including fingers (HCC)   Abscess of right index finger   Sepsis due to Streptococcus pyogenes (HCC)   Rheumatoid arthritis (HCC)   Streptococcal bacteremia    Tanya Duncan is a 79 y.o. female  who developed a finger abscess complicated by group A streptococcal bacteremia.  She underwent I&D on the 30th for finger abscess with Dr. Curia.   Apparently the infection tracked deep but not to bone.  There was concern though it seems on the part of our team that the patient may have had osteomyelitis.   She was treated with IV penicillin  while in the hospital then followed by Zyvox  with plans to switch to either high-dose cefadroxil or amoxicillin .  She saw [name] on 16 January and was complaining of nausea on Zyvox .   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   However in the past week she began having severe diarrhea as well as nausea and vomiting was not able to take her amoxicillin  anymore.  She thought that she only had a stomach virus and tried to tough it out.   She has been admitted with what is fulminant clostridium difficile.  #1 Clostridium difficile colitis  --continue vancomycin  po --enteric precautions --avoid PPI and unnecessary antibiotics  #2 Hx of GAS bacteremia secondary to finger abscess sp I and D  Patient seen by Dr. Murrell who  recommended DC soaks and followup in office  I take his note to mean he is not concerned for deep infection. Our team were I believe more concerned for this and hence length of antibiotic therapy to treat bacteremia but possible osteomyelitis or deep infection  I will dc PCN  I personally spent a total of 51 minutes in the care of the patient today including preparing to see the patient, getting/reviewing separately obtained history, performing a medically appropriate exam/evaluation, counseling and educating, placing orders, referring and communicating with other health care professionals, and documenting clinical information in the EHR.  Evaluation of the patient requires complex antimicrobial therapy evaluation, counseling , isolation needs to reduce disease transmission and risk assessment and mitigation.   I will return on Monday and see how she is doing. Dr. Eben is available for questions this weekend.   LOS: 2 days   Jomarie Fleeta Rothman 12/06/2024, 4:41 PM  "

## 2024-12-06 NOTE — Progress Notes (Deleted)
" °   12/06/24 1609  TOC Brief Assessment  Insurance and Status Reviewed  Patient has primary care physician Yes Carlyn, David, MD)  Home environment has been reviewed From home  Prior level of function: Independent  Prior/Current Home Services No current home services  Social Drivers of Health Review SDOH reviewed no interventions necessary  Readmission risk has been reviewed Yes  Transition of care needs no transition of care needs at this time    "

## 2024-12-06 NOTE — Progress Notes (Signed)
 Subjective:   4 weeks s/p incision and drainage right index finger.  States finger is doing well.  No pain.  Has been doing daily soaks and dressing changes.  Objective: Vital signs in last 24 hours: Temp:  [97.9 F (36.6 C)-98.2 F (36.8 C)] 98.1 F (36.7 C) (01/30 1344) Pulse Rate:  [97-109] 109 (01/30 1344) Resp:  [16-20] 20 (01/30 1344) BP: (107-140)/(67-87) 133/87 (01/30 1344) SpO2:  [96 %-100 %] 96 % (01/30 1344)  Intake/Output from previous day: 01/29 0701 - 01/30 0700 In: 2653.6 [P.O.:240; I.V.:1956.6; IV Piggyback:457] Out: -  Intake/Output this shift: Total I/O In: 120 [P.O.:120] Out: -   Recent Labs    12/04/24 1445 12/05/24 0600 12/06/24 0752  HGB 12.3 10.2* 9.5*   Recent Labs    12/05/24 0600 12/06/24 0752  WBC 11.4* 8.1  RBC 3.54* 3.33*  HCT 30.9* 29.0*  PLT 343 363   Recent Labs    12/05/24 0600 12/06/24 0752  NA 130* 130*  K 2.8* 3.8  CL 97* 102  CO2 20* 20*  BUN 18 7*  CREATININE 0.47 0.45  GLUCOSE 113* 137*  CALCIUM  7.6* 7.9*   No results for input(s): LABPT, INR in the last 72 hours.  Intact sensation and capillary refill in finger.  Wound healed.  Mild swelling remains.  No erythema or streaking.  No tenderness to palpation.  Able to move dip joint without pain.   Assessment/Plan: S/p I&D right index finger.  Doing well with digit.  Okay to stop daily soaks.  Follow up in office after discharge.   Tanya Duncan 12/06/2024, 4:06 PM

## 2024-12-07 DIAGNOSIS — A0472 Enterocolitis due to Clostridium difficile, not specified as recurrent: Secondary | ICD-10-CM | POA: Diagnosis not present

## 2024-12-07 LAB — CBC WITH DIFFERENTIAL/PLATELET
Abs Immature Granulocytes: 2.26 10*3/uL — ABNORMAL HIGH (ref 0.00–0.07)
Basophils Absolute: 0 10*3/uL (ref 0.0–0.1)
Basophils Relative: 0 %
Eosinophils Absolute: 0.3 10*3/uL (ref 0.0–0.5)
Eosinophils Relative: 2 %
HCT: 32.9 % — ABNORMAL LOW (ref 36.0–46.0)
Hemoglobin: 10.7 g/dL — ABNORMAL LOW (ref 12.0–15.0)
Immature Granulocytes: 16 %
Lymphocytes Relative: 14 %
Lymphs Abs: 2 10*3/uL (ref 0.7–4.0)
MCH: 29.1 pg (ref 26.0–34.0)
MCHC: 32.5 g/dL (ref 30.0–36.0)
MCV: 89.4 fL (ref 80.0–100.0)
Monocytes Absolute: 1.1 10*3/uL — ABNORMAL HIGH (ref 0.1–1.0)
Monocytes Relative: 8 %
Neutro Abs: 8.4 10*3/uL — ABNORMAL HIGH (ref 1.7–7.7)
Neutrophils Relative %: 60 %
Platelets: 443 10*3/uL — ABNORMAL HIGH (ref 150–400)
RBC: 3.68 MIL/uL — ABNORMAL LOW (ref 3.87–5.11)
RDW: 14.1 % (ref 11.5–15.5)
WBC: 14 10*3/uL — ABNORMAL HIGH (ref 4.0–10.5)
nRBC: 0.1 % (ref 0.0–0.2)

## 2024-12-07 LAB — BASIC METABOLIC PANEL WITH GFR
Anion gap: 8 (ref 5–15)
BUN: 6 mg/dL — ABNORMAL LOW (ref 8–23)
CO2: 23 mmol/L (ref 22–32)
Calcium: 8.5 mg/dL — ABNORMAL LOW (ref 8.9–10.3)
Chloride: 99 mmol/L (ref 98–111)
Creatinine, Ser: 0.45 mg/dL (ref 0.44–1.00)
GFR, Estimated: >60 mL/min
Glucose, Bld: 107 mg/dL — ABNORMAL HIGH (ref 70–99)
Potassium: 3.8 mmol/L (ref 3.5–5.1)
Sodium: 130 mmol/L — ABNORMAL LOW (ref 135–145)

## 2024-12-07 MED ORDER — PROCHLORPERAZINE EDISYLATE 10 MG/2ML IJ SOLN
10.0000 mg | Freq: Four times a day (QID) | INTRAMUSCULAR | Status: DC | PRN
  Administered 2024-12-07 – 2024-12-09 (×2): 10 mg via INTRAVENOUS
  Filled 2024-12-07 (×2): qty 2

## 2024-12-07 MED ORDER — ZOLPIDEM TARTRATE 5 MG PO TABS
10.0000 mg | ORAL_TABLET | Freq: Every evening | ORAL | Status: DC | PRN
  Administered 2024-12-07 – 2024-12-08 (×2): 10 mg via ORAL
  Filled 2024-12-07 (×2): qty 2

## 2024-12-07 MED ORDER — ALUM & MAG HYDROXIDE-SIMETH 200-200-20 MG/5ML PO SUSP
30.0000 mL | ORAL | Status: DC | PRN
  Administered 2024-12-07 – 2024-12-08 (×2): 30 mL via ORAL
  Filled 2024-12-07 (×3): qty 30

## 2024-12-07 MED ORDER — LOPERAMIDE HCL 2 MG PO CAPS
2.0000 mg | ORAL_CAPSULE | Freq: Two times a day (BID) | ORAL | Status: DC
  Administered 2024-12-07: 2 mg via ORAL
  Filled 2024-12-07 (×3): qty 1

## 2024-12-07 NOTE — Progress Notes (Signed)
 [facility]  Tanya Duncan FMW:[id number]  DOB: 03/11/1946  DOA: 12/04/2024  PCP: [name], MD  12/07/2024,10:40 AM  LOS: 3 days    Code Status: Full code     from: Home  79 year old white female high functioning Previous ORIF periprosthetic fracture 05/15/2023 with plate removal (Dr. Dozier) Remote history of GI bleed and peritonitis from ruptured gastric ulcer remotely several years ago Depression Fibromyalgia Rheumatoid arthritis follows with [facility] [name] on [facility] [address] Spondylolisthesis on pain meds oxycodone  Cymbalta Prediabetes   Developed right index finger pain middle of December  given doxycycline Did not improve Came to hospital second distal interphalangeal joint increased joint space periosteal reaction and thought to have infection cellulitis of the right index finger this patient who group A strep 12/30 underwent right index finger DIP joint incision and drainage was discharged home on linezolid  and instructed to follow-up with infectious disease for further instructions--followed up with Dr. Murrell of hand surgery on 1/13 ~continue hypertonic saline soaks dressing changes and to follow-up in 2 weeks with him, also saw [name] 1/16 linezolid  switched to 1000 3 times daily And followed up with primary care for right hip prosthetic pain  Around 1/25 developed significant nausea bloating abdominal pain and vomiting-lab workup revealed sodium 127 potassium 3.8 BUN/creatinine 0.7 WBC 12.2 platelet 364 UA negative CT abdomen pelvis severe diffuse colonic wall thickening edema throughout entire colon consistent with colitis?  C. difficile Blood culture X2 obtained  Because of chest pain we ordered a troponin   Assessment  & Plan :     Stool C. difficile colitis evaluation vancomycin  125 4 times daily likely will need to overlap 7 to 10 days of therapy superimposed on her penicillin  V Still having copious diarrhea-not eating nauseous still given Zofran  4 mg every 6  added Compazine  10 mg every 6 as needed refractory nausea vomiting Because of >4 stools-time-limited treatment with Imodium  2 mg twice daily for 3 doses Not eating-nauseous-monitor trends  Sinus tachycardia, chest pain Sinus tachycardia likely secondary to underlying anxiety troponin was negative no further chest pain or symptoms No further workup  Infected long finger right hand on antibiotics Continues on penicillin  IV--previously was on linezolid  and then switched to Amoxil  high-dose 1000 3 times daily to complete 6 weeks of therapy ending likely around 12/20/2024--- duration/type of antibiotics to be determined per ID-has been seen by hand surgeon wound looks good  Hypovolemic hyponatremia Severe hypokalemia Discontinue saline with K as resolved  Rheumatoid arthritis [facility] follow-up as an outpatient regarding further management  Spondylolisthesis Increasing back pain in the setting of chronic back pain with Nalu spinal stimulator Outpatient MRI was scheduled but could not be performed because of patient having MRI incompatible stimulator in place? Follow-up as an outpatient-continue gabapentin  900 twice daily Bed as able  Prediabetes  Ruptured gastric ulcer in the past several years ago only on Pepcid  40-avoid PPIs given risk of worsening the diarrhea  HTN Amlodipine  5, losartan  25 resumed-adjust blood pressure meds as able going forward  Depression Continue Lexapro  20 lorazepam  1 mg every 8 as needed Pamelor  75 She has mild insomnia and has used high-dose Ambien  10 mg for several years we will resume  Data Reviewed today: Labs are pending today  DVT prophylaxis: Lovenox   Dispo/Global plan: Inpatient     Subjective:   Severe nausea overnight IV was not working apparently Seems a little better now but vomited this morning not really eating much about 8 stools since yesterday  Objective +  exam Vitals:   12/06/24 2005 12/06/24 2040 12/06/24 2100 12/07/24 0511  BP:   137/88  128/85  Pulse:  (!) 106  (!) 109  Resp: 12 16 11 16   Temp:  97.9 F (36.6 C)  97.7 F (36.5 C)  TempSrc:  Oral  Oral  SpO2:  100%  100%  Weight:      Height:       Filed Weights   12/04/24 2309  Weight: 61.2 kg     Examination:  Coherent awake alert no distress CTAB no added sound ROM intact Abdomen soft no rebound slight distention Finger not examined today Chest clear S1-S2 no murmur  Scheduled Meds:  amLODipine   5 mg Oral Daily   enoxaparin  (LOVENOX ) injection  40 mg Subcutaneous Q24H   escitalopram   20 mg Oral q AM   famotidine   40 mg Oral Daily   gabapentin   900 mg Oral BID   loperamide   2 mg Oral BID   losartan   25 mg Oral Daily   nortriptyline   75 mg Oral QHS   vancomycin   125 mg Oral QID   Continuous Infusions:   acetaminophen  **OR** acetaminophen , albuterol , alum & mag hydroxide-simeth, lip balm, LORazepam , ondansetron  **OR** ondansetron  (ZOFRAN ) IV, prochlorperazine   I spent 25 minutes today before, during and after this patient interview and examination--reviewing pertinent data, coordinating the patient's care and in communication with the care team and other medical professionals  Colen Grimes, MD  [facility]

## 2024-12-08 DIAGNOSIS — A0472 Enterocolitis due to Clostridium difficile, not specified as recurrent: Secondary | ICD-10-CM | POA: Diagnosis not present

## 2024-12-08 MED ORDER — VANCOMYCIN HCL 125 MG PO CAPS: 125.0000 mg | ORAL_CAPSULE | Freq: Four times a day (QID) | ORAL | 0 refills | Status: DC

## 2024-12-08 MED ORDER — ORAL CARE MOUTH RINSE: 15.0000 mL | OROMUCOSAL | Status: DC | PRN

## 2024-12-08 NOTE — Plan of Care (Signed)
  Problem: Activity: Goal: Risk for activity intolerance will decrease Outcome: Progressing   Problem: Nutrition: Goal: Adequate nutrition will be maintained Outcome: Progressing   Problem: Coping: Goal: Level of anxiety will decrease Outcome: Progressing   Problem: Pain Managment: Goal: General experience of comfort will improve and/or be controlled Outcome: Progressing

## 2024-12-08 NOTE — Plan of Care (Signed)

## 2024-12-08 NOTE — Progress Notes (Signed)
 [facility]  Magdelyn Liaw FMW:[id number]  DOB: 30-Oct-1946  DOA: 12/04/2024  PCP: [name], MD  12/08/2024,3:01 PM  LOS: 4 days    Code Status: Full code     from: Home  79 year old white female high functioning Previous ORIF periprosthetic fracture 05/15/2023 with plate removal (Dr. Dozier) Remote history of GI bleed and peritonitis from ruptured gastric ulcer remotely several years ago Depression Fibromyalgia Rheumatoid arthritis follows with [facility] [name] on [facility] RD Spondylolisthesis on pain meds oxycodone  Cymbalta Prediabetes   Developed right index finger pain middle of December  given doxycycline Did not improve Came to hospital second distal interphalangeal joint increased joint space periosteal reaction and thought to have infection cellulitis of the right index finger this patient who group A strep 12/30 underwent right index finger DIP joint incision and drainage was discharged home on linezolid  and instructed to follow-up with infectious disease for further instructions--followed up with Dr. Murrell of hand surgery on 1/13 ~continue hypertonic saline soaks dressing changes and to follow-up in 2 weeks with him, also saw [name] 1/16 linezolid  switched to 1000 3 times daily And followed up with primary care for right hip prosthetic pain  Around 1/25 developed significant nausea bloating abdominal pain and vomiting-lab workup revealed sodium 127 potassium 3.8 BUN/creatinine 0.7 WBC 12.2 platelet 364 UA negative CT abdomen pelvis severe diffuse colonic wall thickening edema throughout entire colon consistent with colitis?  C. difficile Blood culture X2 obtained  Because of chest pain we ordered a troponin   Assessment  & Plan :     Stool C. difficile colitis evaluation vancomycin  125 4 times daily end of treatment 12/14/2024 (10 days) Diarrhea seems to have self resolved without Imodium  therefore discontinued- tolerating p.o. a little bit better  Sinus tachycardia,  chest pain Sinus tachycardia likely secondary to underlying anxiety troponin was negative no further chest pain or symptoms No further workup  Infected long finger right hand on antibiotics Infectious disease saw the patient and after seen by Dr. Murrell of hand surgery they recommended holding p.o. antibiotics with Amoxil  and reassessing only Has not had a fever and has been discontinued off the penicillin  and the wound and area looks okay Will follow-up in the outpatient with both ID and hand surgery  Hypovolemic hyponatremia Severe hypokalemia Discontinue saline with K as resolved, intermittent labs only  Rheumatoid arthritis [facility] follow-up as an outpatient regarding further management  Spondylolisthesis Increasing back pain in the setting of chronic back pain with N[place]u spinal stimulator Outpatient MRI was scheduled but could not be performed because of patient having MRI incompatible stimulator in place? Follow-up as an outpatient-and try to obtain then when can confirm-continue gabapentin  900 twice daily Will ambulate as able  Prediabetes  Ruptured gastric ulcer in the past several years ago only on Pepcid  40-avoid PPIs given risk of worsening the diarrhea Has improved  HTN Amlodipine  5, losartan  25 resumed-adjust blood pressure meds as able going forward  Depression Continue Lexapro  20 lorazepam  1 mg every 8 as needed Pamelor  75 She has mild insomnia and has used high-dose Ambien  10 mg for several years we will resume  Data Reviewed today: Labs are pending   DVT prophylaxis: Lovenox   Dispo/Global plan: Inpatient-unable to discharge as roads are not accessible at her home in the morning and try to plan for transport    Subjective:   Overall looks better No nausea no vomiting no chest pain No diarrhea today no fever no chills Eating a little better  Objective + exam Vitals:   12/07/24 1400 12/07/24 2208 12/08/24 0643 12/08/24 1343  BP: (!) 142/83 126/77 130/78  121/79  Pulse:    (!) 110  Resp: 18  17 20   Temp: 98.4 F (36.9 C) 98.2 F (36.8 C) 98 F (36.7 C) 98.3 F (36.8 C)  TempSrc: Oral  Oral Oral  SpO2: 99% 99% 97% 90%  Weight:      Height:       Filed Weights   12/04/24 2309  Weight: 61.2 kg     Examination:  Coherent awake alert no distress CTAB no added sound Miscellaneous Abdomen soft no rebound no guarding No lower extremity edema Neuro is intact moving 4 limbs equally  Scheduled Meds:  amLODipine   5 mg Oral Daily   enoxaparin  (LOVENOX ) injection  40 mg Subcutaneous Q24H   escitalopram   20 mg Oral q AM   famotidine   40 mg Oral Daily   gabapentin   900 mg Oral BID   loperamide   2 mg Oral BID   losartan   25 mg Oral Daily   nortriptyline   75 mg Oral QHS   vancomycin   125 mg Oral QID   Continuous Infusions:   acetaminophen  **OR** acetaminophen , albuterol , alum & mag hydroxide-simeth, lip balm, LORazepam , ondansetron  **OR** ondansetron  (ZOFRAN ) IV, mouth rinse, prochlorperazine , zolpidem   I spent 25 minutes today before, during and after this patient interview and examination--reviewing pertinent data, coordinating the patient's care and in communication with the care team and other medical professionals  Colen Grimes, MD  [facility]

## 2024-12-09 ENCOUNTER — Inpatient Hospital Stay (HOSPITAL_COMMUNITY)

## 2024-12-09 DIAGNOSIS — L02511 Cutaneous abscess of right hand: Secondary | ICD-10-CM | POA: Diagnosis not present

## 2024-12-09 DIAGNOSIS — B950 Streptococcus, group A, as the cause of diseases classified elsewhere: Secondary | ICD-10-CM | POA: Diagnosis not present

## 2024-12-09 DIAGNOSIS — M069 Rheumatoid arthritis, unspecified: Secondary | ICD-10-CM | POA: Diagnosis not present

## 2024-12-09 DIAGNOSIS — R7881 Bacteremia: Secondary | ICD-10-CM | POA: Diagnosis not present

## 2024-12-09 DIAGNOSIS — A0472 Enterocolitis due to Clostridium difficile, not specified as recurrent: Secondary | ICD-10-CM | POA: Diagnosis not present

## 2024-12-09 LAB — CBC WITH DIFFERENTIAL/PLATELET
Abs Immature Granulocytes: 2.53 10*3/uL — ABNORMAL HIGH (ref 0.00–0.07)
Basophils Absolute: 0.2 10*3/uL — ABNORMAL HIGH (ref 0.0–0.1)
Basophils Relative: 1 %
Eosinophils Absolute: 0.2 10*3/uL (ref 0.0–0.5)
Eosinophils Relative: 1 %
HCT: 35.9 % — ABNORMAL LOW (ref 36.0–46.0)
Hemoglobin: 11.5 g/dL — ABNORMAL LOW (ref 12.0–15.0)
Immature Granulocytes: 12 %
Lymphocytes Relative: 11 %
Lymphs Abs: 2.3 10*3/uL (ref 0.7–4.0)
MCH: 28.4 pg (ref 26.0–34.0)
MCHC: 32 g/dL (ref 30.0–36.0)
MCV: 88.6 fL (ref 80.0–100.0)
Monocytes Absolute: 1.2 10*3/uL — ABNORMAL HIGH (ref 0.1–1.0)
Monocytes Relative: 6 %
Neutro Abs: 15.3 10*3/uL — ABNORMAL HIGH (ref 1.7–7.7)
Neutrophils Relative %: 69 %
Platelets: 525 10*3/uL — ABNORMAL HIGH (ref 150–400)
RBC: 4.05 MIL/uL (ref 3.87–5.11)
RDW: 14.5 % (ref 11.5–15.5)
Smear Review: NORMAL
WBC: 21.8 10*3/uL — ABNORMAL HIGH (ref 4.0–10.5)
nRBC: 0 % (ref 0.0–0.2)

## 2024-12-09 LAB — BASIC METABOLIC PANEL WITH GFR
Anion gap: 11 (ref 5–15)
BUN: 10 mg/dL (ref 8–23)
CO2: 25 mmol/L (ref 22–32)
Calcium: 8.6 mg/dL — ABNORMAL LOW (ref 8.9–10.3)
Chloride: 96 mmol/L — ABNORMAL LOW (ref 98–111)
Creatinine, Ser: 0.48 mg/dL (ref 0.44–1.00)
GFR, Estimated: >60 mL/min
Glucose, Bld: 127 mg/dL — ABNORMAL HIGH (ref 70–99)
Potassium: 3.7 mmol/L (ref 3.5–5.1)
Sodium: 132 mmol/L — ABNORMAL LOW (ref 135–145)

## 2024-12-09 MED ORDER — FAMOTIDINE 20 MG PO TABS
40.0000 mg | ORAL_TABLET | Freq: Two times a day (BID) | ORAL | Status: DC
  Administered 2024-12-09 – 2024-12-10 (×2): 40 mg via ORAL
  Filled 2024-12-09 (×2): qty 2

## 2024-12-09 MED ORDER — TIZANIDINE HCL 4 MG PO TABS
4.0000 mg | ORAL_TABLET | Freq: Three times a day (TID) | ORAL | Status: DC | PRN
  Administered 2024-12-09: 4 mg via ORAL
  Filled 2024-12-09: qty 1

## 2024-12-09 MED ORDER — TRAMADOL HCL 50 MG PO TABS
50.0000 mg | ORAL_TABLET | Freq: Four times a day (QID) | ORAL | Status: DC | PRN
  Administered 2024-12-09 – 2024-12-10 (×2): 100 mg via ORAL
  Filled 2024-12-09 (×2): qty 2

## 2024-12-09 NOTE — Plan of Care (Signed)

## 2024-12-09 NOTE — Plan of Care (Signed)
   Problem: Activity: Goal: Risk for activity intolerance will decrease Outcome: Progressing   Problem: Nutrition: Goal: Adequate nutrition will be maintained Outcome: Progressing   Problem: Pain Managment: Goal: General experience of comfort will improve and/or be controlled Outcome: Progressing   Problem: Safety: Goal: Ability to remain free from injury will improve Outcome: Progressing

## 2024-12-10 DIAGNOSIS — A0472 Enterocolitis due to Clostridium difficile, not specified as recurrent: Secondary | ICD-10-CM | POA: Diagnosis not present

## 2024-12-10 LAB — CBC WITH DIFFERENTIAL/PLATELET
Abs Immature Granulocytes: 1.7 10*3/uL — ABNORMAL HIGH (ref 0.00–0.07)
Basophils Absolute: 0.1 10*3/uL (ref 0.0–0.1)
Basophils Relative: 1 %
Eosinophils Absolute: 0.4 10*3/uL (ref 0.0–0.5)
Eosinophils Relative: 2 %
HCT: 29.2 % — ABNORMAL LOW (ref 36.0–46.0)
Hemoglobin: 9.6 g/dL — ABNORMAL LOW (ref 12.0–15.0)
Immature Granulocytes: 10 %
Lymphocytes Relative: 14 %
Lymphs Abs: 2.4 10*3/uL (ref 0.7–4.0)
MCH: 29.1 pg (ref 26.0–34.0)
MCHC: 32.9 g/dL (ref 30.0–36.0)
MCV: 88.5 fL (ref 80.0–100.0)
Monocytes Absolute: 1.3 10*3/uL — ABNORMAL HIGH (ref 0.1–1.0)
Monocytes Relative: 7 %
Neutro Abs: 11.4 10*3/uL — ABNORMAL HIGH (ref 1.7–7.7)
Neutrophils Relative %: 66 %
Platelets: 428 10*3/uL — ABNORMAL HIGH (ref 150–400)
RBC: 3.3 MIL/uL — ABNORMAL LOW (ref 3.87–5.11)
RDW: 14.6 % (ref 11.5–15.5)
Smear Review: NORMAL
WBC: 17.2 10*3/uL — ABNORMAL HIGH (ref 4.0–10.5)
nRBC: 0 % (ref 0.0–0.2)

## 2024-12-10 LAB — CULTURE, BLOOD (ROUTINE X 2)
Culture: NO GROWTH
Culture: NO GROWTH

## 2024-12-10 LAB — BASIC METABOLIC PANEL WITH GFR
Anion gap: 8 (ref 5–15)
BUN: 9 mg/dL (ref 8–23)
CO2: 27 mmol/L (ref 22–32)
Calcium: 8.4 mg/dL — ABNORMAL LOW (ref 8.9–10.3)
Chloride: 94 mmol/L — ABNORMAL LOW (ref 98–111)
Creatinine, Ser: 0.44 mg/dL (ref 0.44–1.00)
GFR, Estimated: >60 mL/min
Glucose, Bld: 115 mg/dL — ABNORMAL HIGH (ref 70–99)
Potassium: 3.5 mmol/L (ref 3.5–5.1)
Sodium: 129 mmol/L — ABNORMAL LOW (ref 135–145)

## 2024-12-10 MED ORDER — VANCOMYCIN HCL 125 MG PO CAPS: 125.0000 mg | ORAL_CAPSULE | Freq: Four times a day (QID) | ORAL | 0 refills | Status: AC

## 2024-12-10 MED ORDER — PREDNISONE 20 MG PO TABS: ORAL_TABLET | ORAL | 0 refills | Status: AC

## 2024-12-10 NOTE — Care Management Important Message (Signed)
 Important Message  Patient Details IM Letter given. Name: Tanya Duncan MRN: [id number] Date of Birth: Feb 23, 1946   Important Message Given:  Yes - Medicare IM     [name] 12/10/2024, 10:48 AM

## 2024-12-10 NOTE — Plan of Care (Signed)

## 2024-12-27 ENCOUNTER — Ambulatory Visit: Payer: Self-pay | Admitting: Internal Medicine

## 2025-01-16 ENCOUNTER — Ambulatory Visit: Admitting: Obstetrics
